# Patient Record
Sex: Male | Born: 1945
Health system: Southern US, Community
[De-identification: ages and names within clinical notes are randomized; demographics above are authoritative.]

## PROBLEM LIST (undated history)

## (undated) DIAGNOSIS — I10 Essential (primary) hypertension: Secondary | ICD-10-CM

## (undated) DIAGNOSIS — Z87442 Personal history of urinary calculi: Secondary | ICD-10-CM

## (undated) DIAGNOSIS — E119 Type 2 diabetes mellitus without complications: Secondary | ICD-10-CM

## (undated) DIAGNOSIS — R001 Bradycardia, unspecified: Secondary | ICD-10-CM

## (undated) DIAGNOSIS — M5030 Other cervical disc degeneration, unspecified cervical region: Secondary | ICD-10-CM

## (undated) DIAGNOSIS — G4733 Obstructive sleep apnea (adult) (pediatric): Secondary | ICD-10-CM

## (undated) DIAGNOSIS — I4891 Unspecified atrial fibrillation: Secondary | ICD-10-CM

## (undated) DIAGNOSIS — K219 Gastro-esophageal reflux disease without esophagitis: Secondary | ICD-10-CM

## (undated) DIAGNOSIS — M51379 Other intervertebral disc degeneration, lumbosacral region without mention of lumbar back pain or lower extremity pain: Secondary | ICD-10-CM

## (undated) DIAGNOSIS — Z9989 Dependence on other enabling machines and devices: Secondary | ICD-10-CM

## (undated) DIAGNOSIS — I4892 Unspecified atrial flutter: Secondary | ICD-10-CM

## (undated) DIAGNOSIS — C679 Malignant neoplasm of bladder, unspecified: Secondary | ICD-10-CM

## (undated) DIAGNOSIS — M459 Ankylosing spondylitis of unspecified sites in spine: Secondary | ICD-10-CM

## (undated) DIAGNOSIS — F32A Depression, unspecified: Secondary | ICD-10-CM

## (undated) DIAGNOSIS — F419 Anxiety disorder, unspecified: Secondary | ICD-10-CM

## (undated) DIAGNOSIS — M069 Rheumatoid arthritis, unspecified: Secondary | ICD-10-CM

## (undated) DIAGNOSIS — E785 Hyperlipidemia, unspecified: Secondary | ICD-10-CM

## (undated) DIAGNOSIS — M5137 Other intervertebral disc degeneration, lumbosacral region: Secondary | ICD-10-CM

## (undated) DIAGNOSIS — G8929 Other chronic pain: Secondary | ICD-10-CM

## (undated) DIAGNOSIS — M5135 Other intervertebral disc degeneration, thoracolumbar region: Secondary | ICD-10-CM

## (undated) DIAGNOSIS — I351 Nonrheumatic aortic (valve) insufficiency: Secondary | ICD-10-CM

## (undated) DIAGNOSIS — I77810 Thoracic aortic ectasia: Secondary | ICD-10-CM

## (undated) DIAGNOSIS — K5792 Diverticulitis of intestine, part unspecified, without perforation or abscess without bleeding: Secondary | ICD-10-CM

## (undated) DIAGNOSIS — Z950 Presence of cardiac pacemaker: Secondary | ICD-10-CM

## (undated) DIAGNOSIS — J449 Chronic obstructive pulmonary disease, unspecified: Secondary | ICD-10-CM

## (undated) DIAGNOSIS — G43909 Migraine, unspecified, not intractable, without status migrainosus: Secondary | ICD-10-CM

## (undated) DIAGNOSIS — I428 Other cardiomyopathies: Secondary | ICD-10-CM

## (undated) DIAGNOSIS — N200 Calculus of kidney: Secondary | ICD-10-CM

## (undated) DIAGNOSIS — F329 Major depressive disorder, single episode, unspecified: Secondary | ICD-10-CM

## (undated) HISTORY — DX: Essential (primary) hypertension: I10

## (undated) HISTORY — DX: Other cardiomyopathies: I42.8

## (undated) HISTORY — DX: Hyperlipidemia, unspecified: E78.5

## (undated) HISTORY — DX: Unspecified atrial fibrillation: I48.91

## (undated) HISTORY — DX: Bradycardia, unspecified: R00.1

## (undated) HISTORY — DX: Type 2 diabetes mellitus without complications: E11.9

## (undated) HISTORY — PX: LAPAROSCOPIC CHOLECYSTECTOMY: SUR755

## (undated) HISTORY — DX: Unspecified atrial flutter: I48.92

## (undated) HISTORY — DX: Thoracic aortic ectasia: I77.810

## (undated) HISTORY — DX: Other chronic pain: G89.29

## (undated) HISTORY — DX: Nonrheumatic aortic (valve) insufficiency: I35.1

## (undated) HISTORY — DX: Diverticulitis of intestine, part unspecified, without perforation or abscess without bleeding: K57.92

## (undated) HISTORY — PX: KNEE ARTHROSCOPY: SUR90

## (undated) HISTORY — PX: TRANSURETHRAL RESECTION OF BLADDER TUMOR WITH GYRUS (TURBT-GYRUS): SHX6458

---
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
## 1989-03-06 HISTORY — PX: CYSTOSCOPY WITH HOLMIUM LASER LITHOTRIPSY: SHX6639

## 1999-04-03 ENCOUNTER — Ambulatory Visit (HOSPITAL_COMMUNITY): Admission: RE | Admit: 1999-04-03 | Discharge: 1999-04-03 | Payer: Self-pay | Admitting: Cardiology

## 2004-09-08 ENCOUNTER — Encounter: Admission: RE | Admit: 2004-09-08 | Discharge: 2004-09-08 | Payer: Self-pay | Admitting: Rheumatology

## 2008-05-18 ENCOUNTER — Ambulatory Visit: Payer: Self-pay | Admitting: Cardiology

## 2008-05-18 ENCOUNTER — Encounter: Payer: Self-pay | Admitting: Cardiology

## 2008-05-20 ENCOUNTER — Encounter: Payer: Self-pay | Admitting: Cardiology

## 2008-06-13 ENCOUNTER — Ambulatory Visit: Payer: Self-pay | Admitting: Cardiology

## 2008-06-18 ENCOUNTER — Ambulatory Visit: Payer: Self-pay | Admitting: Cardiology

## 2008-06-18 ENCOUNTER — Encounter: Payer: Self-pay | Admitting: Physician Assistant

## 2008-07-05 ENCOUNTER — Encounter: Payer: Self-pay | Admitting: Cardiology

## 2008-07-10 ENCOUNTER — Encounter: Payer: Self-pay | Admitting: Cardiology

## 2008-07-17 ENCOUNTER — Ambulatory Visit: Payer: Self-pay | Admitting: Cardiology

## 2009-02-06 ENCOUNTER — Ambulatory Visit: Payer: Self-pay | Admitting: Cardiology

## 2009-02-27 DIAGNOSIS — E782 Mixed hyperlipidemia: Secondary | ICD-10-CM

## 2009-02-27 DIAGNOSIS — I4892 Unspecified atrial flutter: Secondary | ICD-10-CM

## 2009-02-27 DIAGNOSIS — I10 Essential (primary) hypertension: Secondary | ICD-10-CM | POA: Insufficient documentation

## 2009-09-30 ENCOUNTER — Ambulatory Visit: Payer: Self-pay | Admitting: Cardiology

## 2009-09-30 DIAGNOSIS — R079 Chest pain, unspecified: Secondary | ICD-10-CM

## 2009-12-13 ENCOUNTER — Encounter (INDEPENDENT_AMBULATORY_CARE_PROVIDER_SITE_OTHER): Payer: Self-pay | Admitting: *Deleted

## 2009-12-13 ENCOUNTER — Ambulatory Visit: Payer: Self-pay | Admitting: Cardiology

## 2009-12-13 DIAGNOSIS — F17200 Nicotine dependence, unspecified, uncomplicated: Secondary | ICD-10-CM

## 2009-12-13 DIAGNOSIS — R0989 Other specified symptoms and signs involving the circulatory and respiratory systems: Secondary | ICD-10-CM

## 2009-12-30 ENCOUNTER — Ambulatory Visit: Payer: Self-pay | Admitting: Cardiology

## 2010-01-01 ENCOUNTER — Ambulatory Visit: Payer: Self-pay | Admitting: Physician Assistant

## 2010-01-01 DIAGNOSIS — I359 Nonrheumatic aortic valve disorder, unspecified: Secondary | ICD-10-CM

## 2010-07-01 ENCOUNTER — Encounter: Payer: Self-pay | Admitting: Cardiology

## 2010-07-02 ENCOUNTER — Encounter: Payer: Self-pay | Admitting: Cardiology

## 2010-07-08 ENCOUNTER — Ambulatory Visit
Admission: RE | Admit: 2010-07-08 | Discharge: 2010-07-08 | Payer: Self-pay | Source: Home / Self Care | Attending: Cardiology | Admitting: Cardiology

## 2010-07-08 ENCOUNTER — Encounter: Payer: Self-pay | Admitting: Cardiology

## 2010-08-05 NOTE — Assessment & Plan Note (Signed)
Summary: 6 month fu -recv reminder vs   Visit Type:  Follow-up Primary Provider:  Dr. [name]   History of Present Illness: 65 year old male presents for a followup visit. He denies any sense of progressive palpitations. Today's electrocardiogram indicates that he is in atrial fibrillation with controlled ventricular response. He reports compliance with the medications outlined below, and has a routine followup visit with Dr. [name] scheduled for next week.  Mr. Ciardullo continues to complain of occasional left-sided, sudden onset, atypical chest pain. This has not changed in pattern, frequency, or intensity. He continues to smoke one pack per day tobacco has been counseled on smoking cessation. He has NYHA class 2-3 dyspnea on exertion. This is also stable.  He reports significant functional limitation related to hip and leg pain, using a cane to ambulate.  I reviewed his prior nonischemic Cardiolite from December 2009.  Preventive Screening-Counseling & Management  Alcohol-Tobacco     Smoking Status: current     Smoking Cessation Counseling: yes     Packs/Day: 1 PPD  Current Medications (verified): 1)  Lisinopril 20 Mg Tabs (Lisinopril) .... Take 1 Tablet By Mouth Once A Day 2)  Simvastatin 40 Mg Tabs (Simvastatin) .... Take 1 Tablet By Mouth Every Night 3)  Aspir-Low 81 Mg Tbec (Aspirin) .... Take 1 Tablet By Mouth Once A Day 4)  Amlodipine Besylate 10 Mg Tabs (Amlodipine Besylate) .... Take One Tablet By Mouth Daily 5)  B Complex-B12  Tabs (B Complex Vitamins) .... Take 1 Tablet By Mouth Two Times A Day 6)  Metformin Hcl 500 Mg Tabs (Metformin Hcl) .... Take 1 Tablet By Mouth Two Times A Day 7)  Alprazolam 1 Mg Tabs (Alprazolam) .... Take 1 Tablet By Mouth Three Times A Day 8)  Percocet 10-325 Mg Tabs (Oxycodone-Acetaminophen) .... Take 1 Tablet By Mouth Three Times A Day As Needed  Allergies (verified): No Known Drug Allergies  Comments:  Nurse/Medical Assistant: The  patient's medications and allergies were reviewed with the patient and were updated in the Medication and Allergy Lists. List reviewed.   Past History:  Past Medical History: Last updated: 09/29/2009 Atrial Flutter - has preferred ASA to Coumadin Diabetes Type 2 Hypertension Chronic pain - [facility] Hyperlipidemia Nonischemic Cardiolite 12/09, LVEF 55% Mild aortic root dilatation, mild AR Diverticulitis  Social History: Last updated: 09/29/2009 Disabled  Divorced  Tobacco Use - Yes Alcohol Use - yes Regular Exercise - no Drug Use - former (cocaine)  Clinical Review Panels:  Echocardiogram Echocardiogram Left Ventricle:         The left ventricular chamber size is normal. Mild concentric left         ventricular hypertrophy is observed. Left ventricular systolic         function is at the lower limits of normal.  The estimated ejection         fraction is 50-55%.                     Left Atrium:         The left atrium is mildly dilated.                    Right Ventricle:         The right ventricular cavity size is normal. The right ventricular         global systolic function is normal.  Right Atrium:         The right atrial cavity size is normal.                    Aortic Valve:         The aortic valve is trileaflet. Mild aortic cusp sclerosis is present.         There is mild aortic regurgitation.  There is no evidence of aortic         stenosis.                    Mitral Valve:         The mitral valve leaflets appear normal. There is a trace of mitral         regurgitation.                    Tricuspid Valve:         There is no evidence of tricuspid valve regurgitation. The right         ventricular systolic pressure is calculated at 16 mmHg.                     Pulmonic Valve:         There is no evidence of pulmonic regurgitation.                    Pericardium:         There is no pericardial effusion.                     Aorta:         There is mild dilatation of the aortic root.  (05/21/2008)    Social History: Packs/Day:  1 PPD  Review of Systems  The patient denies anorexia, fever, syncope, peripheral edema, prolonged cough, headaches, melena, and hematochezia.         Otherwise reviewed and negative except as outlined above.  Vital Signs:  Patient profile:   65 year old male Height:      68 inches Weight:      219 pounds BMI:     33.42 Pulse rate:   73 / minute BP sitting:   116 / 77  (left arm) Cuff size:   large  Vitals Entered By: Georgina Peer (September 30, 2009 11:01 AM)  Nutrition Counseling: Patient's BMI is greater than 25 and therefore counseled on weight management options.   Physical Exam  Additional Exam:  Overweight male in no acute distress. HEENT: Conjunctiva and lids normal, oropharynx with moist mucosa. Neck: Supple, no carotid bruits. Lungs: Diminished, nonlabored, no wheezing. Cardiac: Irregularly irregular, no S3 or pericardial rub. Extremity: No frank pitting edema. Skin: Warm and dry.   EKG  Procedure date:  09/30/2009  Findings:      Atrial fibrillation at 75 beats per minute with nonspecific ST-T wave changes.  Impression & Recommendations:  Problem # 1:  ATRIAL FLUTTER (ICD-427.32)  History of atrial flutter as well as paroxysmal atrial fibrillation. Patient has preferred aspirin to Coumadin over time with CHADS2 score of 2. He is not bothered by palpitations, and has adequate rate control at baseline without any AV nodal blocking drugs. We will continue present strategy and arrange a followup visit in 6 months.  His updated medication list for this problem includes:    Aspir-low 81 Mg Tbec (Aspirin) .Marland Kitchen... Take 1 tablet by mouth once a day  Orders:  EKG w/ Interpretation (93000)  Problem # 2:  HYPERTENSION, UNSPECIFIED (ICD-401.9)  Blood pressure well controlled today.  His updated medication list for this problem includes:    Lisinopril 20  Mg Tabs (Lisinopril) .Marland Kitchen... Take 1 tablet by mouth once a day    Aspir-low 81 Mg Tbec (Aspirin) .Marland Kitchen... Take 1 tablet by mouth once a day    Amlodipine Besylate 10 Mg Tabs (Amlodipine besylate) .Marland Kitchen... Take one tablet by mouth daily  Problem # 3:  CHEST PAIN UNSPECIFIED (ICD-786.50)  Atypical by description, with prior nonischemic Cardiolite from December 2009. We will keep an eye on this, and plan a followup evaluation with an echocardiogram and Cardiolite around the time of his next visit.  His updated medication list for this problem includes:    Lisinopril 20 Mg Tabs (Lisinopril) .Marland Kitchen... Take 1 tablet by mouth once a day    Aspir-low 81 Mg Tbec (Aspirin) .Marland Kitchen... Take 1 tablet by mouth once a day    Amlodipine Besylate 10 Mg Tabs (Amlodipine besylate) .Marland Kitchen... Take one tablet by mouth daily  Patient Instructions: 1)  Your physician wants you to follow-up in: 6 months. You will receive a reminder letter in the mail one-two months in advance. If you don't receive a letter, please call our office to schedule the follow-up appointment. 2)  Your physician recommends that you continue on your current medications as directed. Please refer to the Current Medication list given to you today.  Appended Document: 6 month fu -recv reminder vs    Clinical Lists Changes  Medications: Added new medication of CYMBALTA 60 MG CPEP (DULOXETINE HCL) Take 1 tablet by mouth once a day Added new medication of METHOCARBAMOL 500 MG TABS (METHOCARBAMOL) Take 1 tablet by mouth every 6 hrs as needed.

## 2010-08-05 NOTE — Letter (Signed)
Summary: Lexiscan or Dobutamine Adult nurse at [facility]. [address]   [place], [place] [address]   Phone: [phone]  Fax: [phone]      Allen or Dobutamine Cardiolite Strss Test    [facility]  Appointment Date:_  Appointment Time:_  Your doctor has ordered a CARDIOLITE STRESS TEST using a medication to stimulate exercise so that you will not have to walk on the treadmill to determine the condition of your heart during stress. If you take blood pressure medication, ask your doctor if you should take it the day of your test. You should not have anything to eat or drink at least 4 hours before your test is scheduled, and no caffeine, including decaffeinated tea and coffee, chocolate, and soft drinks for 24 hours before your test.  You will need to register at the Outpatient/Main Entrance at the hospital 15 minutes before your appointment time. It is a good idea to bring a copy of your order with you. They will direct you to the Diagnostic Imaging (Radiology) Department.  You will be asked to undress from the waist up and given a hospital gown to wear, so dress comfortably from the waist down for example: Sweat pants, shorts, or skirt Rubber soled lace up shoes (tennis shoes)  Plan on about three hours from registration to release from the hospital   You may take all of your medications with water the morning of your test. If you do not eat breakfast, you may want to hold your diabetic medications.

## 2010-08-05 NOTE — Assessment & Plan Note (Signed)
Summary: ROV-PER PATIENT DR. Wenda Tyler REQUESTING   Visit Type:  Follow-up Primary Provider:  Dr. Celedonio Tyler   History of Present Illness: 65 year old male referred for followup by Dr. Wenda Tyler. He reports having recurrent episodes of "sharp" chest discomfort, sporadic overall. He states this has been worse over the last 3-4 weeks, sometimes with radiation into the left face and arm. The symptoms last for a few minutes. He has not been using nitroglycerin. He also complaints of some unassociated right-sided posterior neck pain.  Prior noninvasive cardiac testing is reviewed below. He has a history of normal coronary arteries by remote cardiac catheterization in 2000 by Dr. Wynonia Tyler.  He has had no sense of palpitations or dizziness. Continues to prefer aspirin for management of atrial flutter.  Preventive Screening-Counseling & Management  Alcohol-Tobacco     Smoking Status: current     Packs/Day: 1.0  Comments: Started about 65 yrs old  Current Medications (verified): 1)  Lisinopril 20 Mg Tabs (Lisinopril) .... Take 1 Tablet By Mouth Once A Day 2)  Simvastatin 40 Mg Tabs (Simvastatin) .... Take 1 Tablet By Mouth Every Night 3)  Aspir-Low 81 Mg Tbec (Aspirin) .... Take 1 Tablet By Mouth Once A Day 4)  Amlodipine Besylate 10 Mg Tabs (Amlodipine Besylate) .... Take One Tablet By Mouth Daily 5)  B Complex-B12  Tabs (B Complex Vitamins) .... Take 1 Tablet By Mouth Two Times A Day 6)  Metformin Hcl 500 Mg Tabs (Metformin Hcl) .... Take 1 Tablet By Mouth Once Daily 7)  Alprazolam 1 Mg Tabs (Alprazolam) .... Take 1 Tablet By Mouth Three Times A Day 8)  Percocet 10-325 Mg Tabs (Oxycodone-Acetaminophen) .... Take 1 Tablet By Mouth Three Times A Day As Needed 9)  Citalopram Hydrobromide 40 Mg Tabs (Citalopram Hydrobromide) .... Take 1 1/2 Tablet By Mouth Once Daily. 10)  Vitamin D (Ergocalciferol) 50000 Unit Caps (Ergocalciferol) .... Take 1 Capsule By Mouth Once Per Week 11)  Metoprolol Tartrate 25  Mg Tabs (Metoprolol Tartrate) .... Take 1/2 Tablet By Mouth Twice A Day 12)  Nitrostat 0.4 Mg Subl (Nitroglycerin) .Marland Kitchen.. 1 Tablet Under Tongue At Onset of Chest Pain; You May Repeat Every 5 Minutes For Up To 3 Doses.  Allergies (verified): No Known Drug Allergies  Comments:  Nurse/Medical Assistant: The patient's medications were reviewed with the patient and were updated in the Medication List. Pt brought a list of medications to office visit.  Dylan Maxin, RN, BSN (December 13, 2009 10:50 AM)  Past History:  Past Medical History: Last updated: 09/29/2009 Atrial Flutter - has preferred ASA to Coumadin Diabetes Type 2 Hypertension Chronic pain - followed at [facility] Hyperlipidemia Nonischemic Cardiolite 12/09, LVEF 55% Mild aortic root dilatation, mild AR Diverticulitis  Social History: Last updated: 09/29/2009 Disabled  Divorced  Tobacco Use - Yes Alcohol Use - yes Regular Exercise - no Drug Use - former (cocaine)   Social History: Packs/Day:  1.0  Review of Systems       The patient complains of chest pain and headaches.  The patient denies anorexia, fever, syncope, dyspnea on exertion, peripheral edema, prolonged cough, hemoptysis, melena, hematochezia, and severe indigestion/heartburn.         Otherwise reviewed and negative.  Vital Signs:  Patient profile:   65 year old male Height:      68 inches Weight:      224 pounds Pulse rate:   65 / minute BP sitting:   122 / 78  (left arm) Cuff size:   large  Vitals Entered By: Dylan Maxin, RN, BSN (December 13, 2009 10:44 AM) Comments Dr. Wenda Tyler requested appt d/t shart chest pains. also pain down (L) arm. Pt states he's also had sharp pain up the back of his neck and into his head.   Physical Exam  Additional Exam:  Overweight male in no acute distress. Uses cane to walk. HEENT: Conjunctiva and lids normal, oropharynx with poor dentition. Neck: Supple, question soft right carotid bruit. Lungs: Diminished,  nonlabored, no wheezing. Cardiac: Irregularly irregular, no S3 or pericardial rub. Extremity: No frank pitting edema. Brace on right wrist. Skin: Warm and dry. Musculoskeletal: No kyphosis. Neuropsychiatric: Alert and oriented x3, affect grossly appropriate.   Nuclear Study  Procedure date:  06/18/2008  Findings:      Nondiagnostic ST changes with LVEF calculated at 39%, global hypokinesis, no reversible perfusion defects to suggest ischemia.  Echocardiogram  Procedure date:  05/21/2008  Findings:      Mild LVH with LVEF 50-55%, mild LAE, mild AR, trace MR, RVSP 16 mmHg.  EKG  Procedure date:  12/13/2009  Findings:      Probable atrial flutter with variable conduction, 66 beats per minute, nonspecific ST-T changes.  Impression & Recommendations:  Problem # 1:  CHEST PAIN UNSPECIFIED (ICD-786.50)  Somewhat atypical, however progressing in frequency and duration. We had already discussed followup ischemic testing for his next routine visit. Will go ahead and arrange a Lexiscan Cardiolite on medical therapy as well as a 2-D echocardiogram to followup LVEF. Office follow up over the next 3 weeks for review. Otherwise no specific medication changes were made. He has nitroglycerin available.  His updated medication list for this problem includes:    Lisinopril 20 Mg Tabs (Lisinopril) .Marland Kitchen... Take 1 tablet by mouth once a day    Aspir-low 81 Mg Tbec (Aspirin) .Marland Kitchen... Take 1 tablet by mouth once a day    Amlodipine Besylate 10 Mg Tabs (Amlodipine besylate) .Marland Kitchen... Take one tablet by mouth daily    Metoprolol Tartrate 25 Mg Tabs (Metoprolol tartrate) .Marland Kitchen... Take 1/2 tablet by mouth twice a day    Nitrostat 0.4 Mg Subl (Nitroglycerin) .Marland Kitchen... 1 tablet under tongue at onset of chest pain; you may repeat every 5 minutes for up to 3 doses.  Orders: 2-D Echocardiogram (2D Echo) Nuclear Med (Nuc Med)  Problem # 2:  ATRIAL FLUTTER (ICD-427.32)  Chronic, rate controlled, patient prefers aspirin  instead of Coumadin.  His updated medication list for this problem includes:    Aspir-low 81 Mg Tbec (Aspirin) .Marland Kitchen... Take 1 tablet by mouth once a day    Metoprolol Tartrate 25 Mg Tabs (Metoprolol tartrate) .Marland Kitchen... Take 1/2 tablet by mouth twice a day  Orders: EKG w/ Interpretation (93000)  Orders: EKG w/ Interpretation (93000)  Problem # 3:  TOBACCO ABUSE (ICD-305.1)  Patient continues to smoke cigarettes. We again discussed the importance of smoking cessation.  Problem # 4:  CAROTID BRUIT (ICD-785.9)  Possible soft right carotid bruit, will further investigate with carotid Dopplers.  Orders: Carotid Duplex (Carotid Duplex)  Problem # 5:  HYPERLIPIDEMIA-MIXED (ICD-272.4)  Cut simvastatin back to 20 mg daily while concurrently on Norvasc.  His updated medication list for this problem includes:    Simvastatin 20 Mg Tabs (Simvastatin) .Marland Kitchen... Take one tablet by mouth daily at bedtime  Patient Instructions: 1)  FOLLOW UP OFFICE VISIT WITH OUR PA ON WEDNESDAY, January 01, 2010 AT 1:30PM. 2)  Decrease your Simvastatin (Zocor) to 20mg  by mouth once daily. This is 1/2 of  your 40mg  tablet. 3)  Your physician has requested that you have a carotid duplex. This test is an ultrasound of the carotid arteries in your neck. It looks at blood flow through these arteries that supply the brain with blood. Allow one hour for this exam. There are no restrictions or special instructions. If the results of your test are normal or stable, you will receive a letter. If they are abnormal, the nurse will contact you by phone.  4)  Your physician has requested that you have an echocardiogram.  Echocardiography is a painless test that uses sound waves to create images of your heart. It provides your doctor with information about the size and shape of your heart and how well your heart's chambers and valves are working.  This procedure takes approximately one hour. There are no restrictions for this procedure. If the  results of your test are normal or stable, you will receive a letter. If they are abnormal, the nurse will contact you by phone.  5)  Your physician has requested that you have an Agricultural consultant.  For further information please visit [email or web address].  Please follow instruction sheet, as given. If the results of your test are normal or stable, you will receive a letter. If they are abnormal, the nurse will contact you by phone.

## 2010-08-05 NOTE — Assessment & Plan Note (Signed)
Summary: 3 WK F/U PER 6/10 OV W/SM-JM   Visit Type:  Follow-up Primary Provider:  Dr. [name]   History of Present Illness: Patient presents for scheduled early followup.  He was recently referred to Dr. Domenic Polite for evaluation of atypical chest pain. He presented with history of normal coronary arteries by previous catheterization in 2000, by Dr. [name]. He had a nonischemic Cardiolite in December 2009, and has multiple cardiac risk factors, including ongoing tobacco smoking and diabetes mellitus.  A Lexiscan Cardiolite test was negative for ischemia; EF 54%. A 2-D echo was also obtained, indicating preserved LVF (EF 50-55%), with stable, mild aortic regurgitation.  He was also referred for carotid Dopplers, for evaluation of a right-sided bruit. This indicated less than 50% bilateral ICA stenosis.  Patient now returns complaining of a singular episode of sharp chest pain, since his last visit. This occurred while he was reaching over across his seat belt to address a Engineer, structural.   Preventive Screening-Counseling & Management  Alcohol-Tobacco     Smoking Status: current     Smoking Cessation Counseling: yes     Packs/Day: 1 PPD  Current Medications (verified): 1)  Lisinopril 20 Mg Tabs (Lisinopril) .... Take 1 Tablet By Mouth Once A Day 2)  Simvastatin 20 Mg Tabs (Simvastatin) .... Take One Tablet By Mouth Daily At Bedtime 3)  Aspir-Low 81 Mg Tbec (Aspirin) .... Take 1 Tablet By Mouth Once A Day 4)  Amlodipine Besylate 10 Mg Tabs (Amlodipine Besylate) .... Take One Tablet By Mouth Daily 5)  Vitamin B-12 1000 Mcg Tabs (Cyanocobalamin) .... Take 1 Tablet By Mouth Two Times A Day 6)  Metformin Hcl 500 Mg Tabs (Metformin Hcl) .... Take 1 Tablet By Mouth Once Daily 7)  Alprazolam 1 Mg Tabs (Alprazolam) .... Take 1 Tablet By Mouth Three Times A Day 8)  Percocet 10-325 Mg Tabs (Oxycodone-Acetaminophen) .... Take 1 Tablet By Mouth Four Times A Day As Needed 9)  Citalopram  Hydrobromide 40 Mg Tabs (Citalopram Hydrobromide) .... Take 1 1/2 Tablet By Mouth Once Daily. 10)  Vitamin D (Ergocalciferol) 50000 Unit Caps (Ergocalciferol) .... Take 1 Capsule By Mouth Once Per Week 11)  Metoprolol Tartrate 25 Mg Tabs (Metoprolol Tartrate) .... Take 1/2 Tablet By Mouth Twice A Day 12)  Nitrostat 0.4 Mg Subl (Nitroglycerin) .Marland Kitchen.. 1 Tablet Under Tongue At Onset of Chest Pain; You May Repeat Every 5 Minutes For Up To 3 Doses.  Allergies (verified): No Known Drug Allergies  Comments:  Nurse/Medical Assistant: The patient's medication list and allergies were reviewed with the patient and were updated in the Medication and Allergy Lists.  Past History:  Past Medical History: Atrial Flutter - has preferred ASA to Coumadin Diabetes Type 2 Hypertension Chronic pain - [facility] at [facility] Hyperlipidemia Nonischemic Cardiolite 12/09, LVEF 55% Normal cardiac catheterization in 2000 Mild aortic root dilatation, mild AR Diverticulitis  Social History: Packs/Day:  1 PPD  Review of Systems       No fevers, chills, hemoptysis, dysphagia, melena, hematocheezia, hematuria, rash, claudication, orthopnea, pnd, pedal edema. All other systems negative.   Vital Signs:  Patient profile:   65 year old male Height:      68 inches Weight:      218 pounds Pulse rate:   57 / minute BP sitting:   111 / 71  (left arm) Cuff size:   large  Vitals Entered By: Georgina Peer (January 01, 2010 1:31 PM)  Physical Exam  Additional Exam:  GEN:65 year old male, sitting upright,  no distress HEENT: NCAT,PERRLA,EOMI NECK: palpable pulses, no bruits; no JVD; no TM LUNGS: CTA bilaterally HEART: irregularly irregular (S1S2); no significant murmurs; no rubs; no gallops ABD: soft, NT; intact BS EXT: intact distal pulses; no edema SKIN: warm, dry MUSC: no obvious deformity NEURO: A/O (x3)     Impression & Recommendations:  Problem # 1:  CHEST PAIN UNSPECIFIED (ICD-786.50)  No further workup  is indicated. Patient presented with recent history of atypical chest pain, and a perfusion imaging study was negative for ischemia. He has history of a normal cardiac catheterization 2000, and a non--ischemic Cardiolite test in December 2009. Continued aggressive primary prevention was advocated, including smoking cessation. Will schedule a return clinic visit with Dr. Domenic Polite in 6 months.  Problem # 2:  AORTIC INSUFFICIENCY (ICD-424.1)  Recently assessed as mild by echocardiography. Continue to monitor clinically.  Problem # 3:  ATRIAL FLUTTER (ICD-427.32)  continue current medication regimen, including low-dose aspirin, per patient's preference.  Problem # 4:  TOBACCO ABUSE (ICD-305.1)  patient strongly advised to stop smoking.  Patient Instructions: 1)  Your physician wants you to follow-up in: 6 months. You will receive a reminder letter in the mail one-two months in advance. If you don't receive a letter, please call our office to schedule the follow-up appointment. 2)  Your physician recommends that you continue on your current medications as directed. Please refer to the Current Medication list given to you today.

## 2010-08-07 NOTE — Assessment & Plan Note (Signed)
Summary: 6 MO FU PER DEC REMINDER   Visit Type:  Follow-up Primary Provider:  Dr. [name]   History of Present Illness: 65 year old male presents for followup. He was seen back in June by [name].  Since last visit he reports using a single sublingual nitroglycerin on one occasion. He continues to have sporadic episodes of chest pain, many of which are atypical in description. I suspect he is having some angina however. We reviewed his most recent stress test and his present medications. He is not reporting any progressive symptoms.   He continues to smoke cigarettes. I continue to discuss with him the importance of smoking cessation. He has not been able to quit.  He also continues to have problems with chronic back pain, uses a cane to ambulate, and sees [name] in [place].   Preventive Screening-Counseling & Management  Alcohol-Tobacco     Smoking Status: current     Smoking Cessation Counseling: yes     Packs/Day: 1 PPD  Current Medications (verified): 1)  Lisinopril 20 Mg Tabs (Lisinopril) .... Take 1 Tablet By Mouth Once A Day 2)  Simvastatin 20 Mg Tabs (Simvastatin) .... Take One Tablet By Mouth Daily At Bedtime 3)  Aspir-Low 81 Mg Tbec (Aspirin) .... Take 1 Tablet By Mouth Once A Day 4)  Amlodipine Besylate 10 Mg Tabs (Amlodipine Besylate) .... Take 1/2 Tablet By Mouth Daily 5)  Vitamin B-12 1000 Mcg Tabs (Cyanocobalamin) .... Take 1 Tablet By Mouth Two Times A Day 6)  Metformin Hcl 500 Mg Tabs (Metformin Hcl) .... Take 1 Tablet By Mouth Two Times A Day 7)  Alprazolam 1 Mg Tabs (Alprazolam) .... Take 1 Tablet By Mouth Three Times A Day 8)  Percocet 5-325 Mg Tabs (Oxycodone-Acetaminophen) .... Take 1 Tablet By Mouth Once A Day 9)  Citalopram Hydrobromide 40 Mg Tabs (Citalopram Hydrobromide) .... Take 1 1/2 Tablet By Mouth Once Daily. 10)  Vitamin D (Ergocalciferol) 50000 Unit Caps (Ergocalciferol) .... Take 1 Capsule By Mouth Once Per Week 11)  Metoprolol  Tartrate 25 Mg Tabs (Metoprolol Tartrate) .... Take 1/2 Tablet By Mouth Twice A Day 12)  Nitrostat 0.4 Mg Subl (Nitroglycerin) .Marland Kitchen.. 1 Tablet Under Tongue At Onset of Chest Pain; You May Repeat Every 5 Minutes For Up To 3 Doses. 13)  Omeprazole 20 Mg Cpdr (Omeprazole) .... Take 1 Tablet By Mouth Once A Day 14)  Cholestyramine 4 Gm Pack (Cholestyramine) .... One Packet By Mouth Daily 15)  Carisoprodol 350 Mg Tabs (Carisoprodol) .... Take 1 Tablet By Mouth Two Times A Day 16)  Lyrica 75 Mg Caps (Pregabalin) .... Take 1 Tablet By Mouth Once A Day  Allergies (verified): No Known Drug Allergies  Comments:  Nurse/Medical Assistant: The patient's medication list and allergies were reviewed with the patient and were updated in the Medication and Allergy Lists.  Past History:  Past Medical History: Last updated: 01/01/2010 Atrial Flutter - has preferred ASA to Coumadin Diabetes Type 2 Hypertension Chronic pain - followed at [facility] Hyperlipidemia Nonischemic Cardiolite 12/09, LVEF 55% Normal cardiac catheterization in 2000 Mild aortic root dilatation, mild AR Diverticulitis  Social History: Last updated: 09/29/2009 Disabled  Divorced  Tobacco Use - Yes Alcohol Use - yes Regular Exercise - no Drug Use - former (cocaine)  Review of Systems       The patient complains of dyspnea on exertion.  The patient denies anorexia, fever, weight loss, syncope, peripheral edema, prolonged cough, headaches, hemoptysis, melena, and hematochezia.  Otherwise reviewed and negative.  Vital Signs:  Patient profile:   65 year old male Height:      68 inches Weight:      228 pounds BMI:     34.79 Pulse rate:   64 / minute BP sitting:   129 / 81  (left arm) Cuff size:   large  Vitals Entered By: Georgina Peer (July 08, 2010 1:23 PM)  Nutrition Counseling: Patient's BMI is greater than 25 and therefore counseled on weight management options.  Physical Exam  Additional Exam:   GEN:65 year old male, sitting upright, no distress HEENT: NCAT,PERRLA,EOMI NECK: palpable pulses, no bruits; no JVD; no TM LUNGS: CTA bilaterally HEART: irregularly irregular (S1S2); no significant murmurs; no rubs; no gallops ABD: soft, NT; intact BS EXT: intact distal pulses; no edema SKIN: warm, dry MUSC: no obvious deformity NEURO: A/O (x3)     Nuclear Study  Procedure date:  12/30/2009  Findings:      Lexiscan Cardiolite with nondiagnostic ST segment changes, occasional PVCs, no chest pain. Evidence of soft tissue attenuation with moderate inferior defect, no clear ischemia, LVEF 54%.  Prior Report Reviewed for Echocardiogram:  Findings: 05/21/2008 Mild LVH with LVEF 50-55%, mild LAE, mild AR, trace MR, RVSP 16 mmHg.  Comments:    Impression & Recommendations:  Problem # 1:  CHEST PAIN UNSPECIFIED (ICD-786.50)  Both combination of typical and atypical symptoms. He used a single sublingual nitroglycerin since I saw him. Symptoms are not progressive. He may well be having occasional angina, however had a reassuring stress test over the last 6 months. Recommend continued medical therapy. Followup in 6 months, sooner if progressive symptoms.  His updated medication list for this problem includes:    Lisinopril 20 Mg Tabs (Lisinopril) .Marland Kitchen... Take 1 tablet by mouth once a day    Aspir-low 81 Mg Tbec (Aspirin) .Marland Kitchen... Take 1 tablet by mouth once a day    Amlodipine Besylate 10 Mg Tabs (Amlodipine besylate) .Marland Kitchen... Take 1/2 tablet by mouth daily    Metoprolol Tartrate 25 Mg Tabs (Metoprolol tartrate) .Marland Kitchen... Take 1/2 tablet by mouth twice a day    Nitrostat 0.4 Mg Subl (Nitroglycerin) .Marland Kitchen... 1 tablet under tongue at onset of chest pain; you may repeat every 5 minutes for up to 3 doses.  Orders: EKG w/ Interpretation (93000)  Problem # 2:  TOBACCO ABUSE (ICD-305.1)  We continue to discuss smoking cessation. He has not been able to quit.  Problem # 3:  HYPERTENSION, UNSPECIFIED  (ICD-401.9)  Blood pressure reasonable today.  His updated medication list for this problem includes:    Lisinopril 20 Mg Tabs (Lisinopril) .Marland Kitchen... Take 1 tablet by mouth once a day    Aspir-low 81 Mg Tbec (Aspirin) .Marland Kitchen... Take 1 tablet by mouth once a day    Amlodipine Besylate 10 Mg Tabs (Amlodipine besylate) .Marland Kitchen... Take 1/2 tablet by mouth daily    Metoprolol Tartrate 25 Mg Tabs (Metoprolol tartrate) .Marland Kitchen... Take 1/2 tablet by mouth twice a day  Problem # 4:  ATRIAL FLUTTER (ICD-427.32)  Patient in sinus rhythm today. He prefers aspirin to Coumadin therapy.  His updated medication list for this problem includes:    Aspir-low 81 Mg Tbec (Aspirin) .Marland Kitchen... Take 1 tablet by mouth once a day    Metoprolol Tartrate 25 Mg Tabs (Metoprolol tartrate) .Marland Kitchen... Take 1/2 tablet by mouth twice a day  Orders: EKG w/ Interpretation (93000)  Patient Instructions: 1)  Your physician wants you to follow-up in: 6 months. You will  receive a reminder letter in the mail one-two months in advance. If you don't receive a letter, please call our office to schedule the follow-up appointment. 2)  Your physician recommends that you continue on your current medications as directed. Please refer to the Current Medication list given to you today. 3)  Your physician discussed the hazards of tobacco use.  Tobacco use cessation is recommended and techniques and options to help you quit were discussed.

## 2010-11-14 ENCOUNTER — Other Ambulatory Visit: Payer: Self-pay | Admitting: Cardiology

## 2010-11-14 NOTE — Telephone Encounter (Signed)
Dylan Tyler

## 2010-11-18 NOTE — Assessment & Plan Note (Signed)
[facility] OFFICE NOTE   HAMDAAN, KOEGLER                       MRN:          [id number]  DATE:02/06/2009                            DOB:          23-Nov-1945    PRIMARY CARE PHYSICIAN:  Janne Napoleon. [name], MD   REASON FOR VISIT:  Scheduled followup.   HISTORY OF PRESENT ILLNESS:  Mr. Brotman was seen in the office back in  December.  His history is detailed in the previous note.  He has a  history of atrial flutter that was diagnosed during an inpatient  consultation by Dr. Stanford [name] in November 2009.  Duration of this  arrhythmia was uncertain, and the patient's CHADS2 was 2 based on  hypertension and diabetes mellitus, although he preferred aspirin over  Coumadin chronically.  He underwent ischemic evaluation which was  overall reassuring, with Cardiolite demonstrating no active ischemia in  December 2009 and an echocardiogram demonstrating a left ventricular  ejection fraction of 50-55% in November 2009.  Mr. Colantonio presents for a  regular visit today denying any sense of palpitations.  He does report  occasional breathlessness and chest discomfort as well as occasional  feelings of dizziness and weakness.  He has had no syncope.  His  electrocardiogram today actually shows normal sinus rhythm with a  prolonged PR interval of 266 msec and nonspecific ST changes.  When he  actually converted from atrial flutter is not entirely clear.   Mr. Stokley tells me that he is followed through the pain clinic at [facility].  He complains a lot of back  pain and other chronic pain.  The details of his treatment plan are not  clear to me.   ALLERGIES:  No known drug allergies.   MEDICATIONS:  1. Aspirin 325 mg p.o. daily.  2. Metformin 500 mg p.o. b.i.d.  3. Vitamin B12 b.i.d.  4. Lisinopril 20 mg p.o. daily (out).  5. Simvastatin 40 mg p.o. daily (out).  6. Xanax 1 mg p.o.  t.i.d.  7. Amlodipine 10 mg p.o. daily.  8. Vicodin p.r.n.   REVIEW OF SYSTEMS:  As outlined above.  He focuses mainly on  intermittent flank and back discomfort as well as leg weakness and pain.  Otherwise, systems reviewed and negative.   PHYSICAL EXAMINATION:  VITAL SIGNS:  Blood pressure is 125/71, heart  rate is 67, weight is 224 pounds.  GENERAL:  This is an obese male in no acute distress.  HEENT:  Conjunctivae and lids normal.  Oropharynx clear.  NECK:  Supple.  No elevated jugular venous pressure.  No loud bruits.  No thyromegaly.  LUNGS:  Clear with diminished breath sounds.  Nonlabored breathing is  noted.  CARDIAC:  A regular rate and rhythm with no loud systolic murmur.  PMI  is indistinct.  ABDOMEN:  Obese and protuberant.  Normoactive bowel sounds.  Unable to  palpate liver edge.  No tenderness.  EXTREMITIES:  Trace edema around the ankles.  Distal pulses 1 plus.  SKIN:  Warm and dry.  MUSCULOSKELETAL:  No  kyphosis noted.  NEUROPSYCHIATRIC:  The patient is alert and oriented x3.   IMPRESSION AND RECOMMENDATIONS:  1. History of atrial flutter of uncertain duration, and with      controlled ventricular response at baseline on no AV nodal blocking      drugs.  CHADS2 score is 2, although the patient prefers aspirin at      this time rather than Coumadin.  He has at some point converted to      sinus rhythm based on his followup electrocardiogram today.      Arguing for underlying conduction system disease is the patient's      prolonged PR interval and also his controlled ventricular response      while in atrial flutter on no specific medications.  He does have      episodic weakness and dizziness, although has not had any frank      syncope.  I asked him to let us know if these symptoms progress and      might provide an event recorder to ensure that he is not having any      symptomatic bradycardia.  Otherwise, he will continue his aspirin.      No other specific  medication adjustments were made.  2. Hypertension, followed largely by Dr. [name].  Refills were provided      for lisinopril.  3. Hyperlipidemia, on simvastatin.  This has also been followed by Dr.      [name].  LDL was 86 based on our labs from December 2009.  A refill      was provided for simvastatin.  4. Ongoing tobacco abuse.  I discussed the importance of smoking      cessation today with Mr. Ruecker.     [name], MD  Electronically Signed    SGM/MedQ  DD: 02/06/2009  DT: 02/07/2009  Job #: [id number]   cc:   Janne Napoleon. [name], MD

## 2010-11-18 NOTE — Assessment & Plan Note (Signed)
[facility]                          [facility]ICE NOTE   PRIMUS, MCNAMARA                       MRN:          [id number]  DATE:06/13/2008                            DOB:          January 23, 1946    PRIMARY CARDIOLOGIST:  [name], MD   REASON FOR VISIT:  Post-hospital followup.   HISTORY:  Dylan Tyler presents to our clinic for the first time,  following recent consultation here at [facility], by Dr.  Stanford [name].  He presented with history of no documented coronary artery  disease, having undergone a normal coronary angiogram in 2000, by Dr.  Tollie Eth.  His presenting symptoms were abdominal and, in fact, he  is undergoing continued evaluation and consideration for possible  cholecystectomy.  He also was recently diagnosed with diverticulitis  earlier this week, following his colonoscopy.  He is currently under the  care of Dr. Valentino Saxon for this, and soon will be establishing with  Dr. [name] as his primary physician.   While he was in the hospital, he was noted to be in a dysrhythmia,  diagnosed as atrial flutter by our team.  He was initially treated with  low-dose Cardizem and Lopressor, but then developed significant  bradycardia with associated pauses.  Recommendation, therefore, was to  not treat with AV nodal blocking agents.  It was also pointed out that  the patient was relatively asymptomatic with this dysrhythmia, which he  informs me today has been longstanding.   With respect to the issue of Coumadin, it was felt that given the  finding of cocaine in his urine, that he might not be a good long-term  candidate for this.  We also wanted to demonstrate compliance on his  part.  A 2-D echocardiogram showed normal LVEF (50-55%) with mild aortic  regurgitation and mild dilatation of the aortic root.   The patient was also started on an ACE inhibitor for treatment of  hypertension.  Of note, his cardiac  risk factors are notable for type 2  diabetes mellitus, hypertension, ongoing tobacco, and age.  A lipid  profile was not drawn.   Clinically, Dylan Tyler denies any tachypalpitations.  He continues to  have abdominal discomfort.  He had occasional, sharp left-sided chest  twinges which are nonexertional.  However, he also suggests symptoms  of significant exertional dyspnea, which is not new, as well as  occasional exertional chest discomfort and pressure, relieved by rest.  He states that he has not had a stress test since his normal coronary  angiogram in 2000.   EKG in our office today indicates atypical atrial flutter with variable  conduction in the mid 80 bpm range.   CURRENT MEDICATIONS:  Full-dose aspirin, metformin 500 b.i.d.,  lisinopril 10 daily.   PHYSICAL EXAMINATION:  VITAL SIGNS:  Blood pressure 174/94, pulse 76,  irregular weight 224.  GENERAL:  A 65 year old male sitting upright in no distress.  HEENT:  Normocephalic, atraumatic.  NECK:  Palpable carotid pulse without bruits.  LUNGS:  Diminished breath sounds at bases, but without crackles or  wheezes.  HEART:  Irregularly irregular.  No significant murmurs.  No rubs.  ABDOMEN:  Protuberant, nontender.  EXTREMITIES:  No significant edema.  NEUROLOGIC:  No focal deficit.   IMPRESSION:  1. Atrial dysrhythmia.      a.     Probable atypical atrial flutter.  2. Hypertension, uncontrolled.  3. Polysubstance abuse.      a.     Including cocaine.  4. Preserved LVF.  5. Mild aortic regurgitation.      a.     Mild aortic root dilatation.  6. Exertional chest discomfort.      a.     Normal coronary angiogram in 2000.  7. Type 2 diabetes mellitus.  8. Ongoing tobacco.  9. Diverticulitis/possible cholecystitis.   PLAN:  1. Schedule adenosine stress Cardiolite for risk stratification.  If      this is negative for evidence of definite ischemia, then the      patient would be cleared to undergo surgery, if that  becomes the      recommendation in the next few weeks.  If, however, there is any      suggestion of ischemia, then we will strongly consider a repeat      cardiac catheterization.  The patient is agreeable with this plan.  2. Increase lisinopril to 20 mg daily for improved blood pressure      control.  We may need to add an additional agent (i.e. Norvasc) if      blood pressure remains uncontrolled.  Of note, he recently      demonstrated significant bradycardia with pauses on low dose of      Cardizem and Lopressor.  We will, therefore, continue deferring      adding any AV nodal blocking agents.  3. Defer decision regarding Coumadin anticoagulation, pending further      cardiac workup.  At this point in time, the patient reports      abstinence from using cocaine.  He appears to be willing to be      compliant both with respect to followup as well as recommended      medications.  We will, therefore, reconsider starting Coumadin, if      he does not need to undergo coronary angiography within the next      few weeks.  In the meanwhile, he is to continue on full-dose      aspirin.  4. Assess lipid status with a fasting lipid profile at the time of his      stress test.  Ideally, the patient should be on a cholesterol-      lowering agent, particularly in light of his diabetes mellitus.      Further recommendations will be made pending review of this result.  5. Schedule a return clinic followup with myself and Dr. Domenic Polite in 1      month, for review of stress test results and further      recommendations.      Mannie Stabile, PA-C  Electronically Signed      [name], MD  Electronically Signed   GS/MedQ  DD: 06/13/2008  DT: 06/14/2008  Job #: [id number]   cc:   [name], M.D.

## 2010-11-18 NOTE — Assessment & Plan Note (Signed)
[facility]                          [facility]ICE NOTE   FRASER, Dylan Tyler                       MRN:          [id number]  DATE:07/17/2008                            DOB:          January 06, 1946    PRIMARY CARE PHYSICIAN:  Dr. [name].   REASON FOR VISIT:  Followup atrial flutter and cardiac testing.   HISTORY OF PRESENT ILLNESS:  Dylan Tyler was seen by [name] back in  December, having been seen in the hospital in consultation by Dr.  Stanford [name] with newly diagnosed atrial flutter.  Duration of this  arrhythmia is uncertain, but potentially chronic.  The patient's CHADS2  score based on hypertension and diabetes mellitus is 2 at this point.  Dylan Tyler was not initiated on Coumadin originally given concerns about  compliance and previous documentation of substance abuse including  cocaine.  He was referred for ischemic testing and underwent a  Cardiolite in December demonstrating no evidence of ischemia.  The  ejection fraction was calculated at 39% in the setting of atrial  flutter, although this was noted to be 50-55% by echocardiography.  Mr.  Tyler is just recently status post elective laparoscopic  cholecystectomy by Dr. [name] last week and is due to see him back in  the office on the July 19, 2008.  He is not reporting any problems  with progressive breathlessness, chest pain, or palpitations.  His  electrocardiogram today shows atrial flutter with a controlled  ventricular rate in the 70s on no specific AV nodal blocking agents.  He  continues on full-dose aspirin at this time.  Today, we had a frank  discussion about Coumadin versus aspirin as far as stroke prophylaxis is  concerned and weighed the risks and benefits.  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  He is also worried  about the cost of regular followup and  being able to take Coumadin  consistently.  We, therefore, elected to continue aspirin at this time  realizing that this is not equivalent in terms of stroke prophylaxis to  Coumadin with his CHADS2 score of 2.  He voiced comfortable with this  and we discussed that this could be revisited down the road.   ALLERGIES:  No known drug allergies.   MEDICATIONS:  1. Aspirin 325 mg p.o. daily.  2. Metformin 500 mg p.o. b.i.d.  3. Vitamin B12 b.i.d.  4. Lisinopril 20 mg p.o. daily.  5. Simvastatin 10 mg p.o. daily.  6. Vicodin p.r.n.  7. Xanax p.r.n.   REVIEW OF SYSTEMS:  As described in the history of present illness.  Otherwise negative.   PHYSICAL EXAMINATION:  VITAL SIGNS:  Blood pressure today is 142/83,  heart rate is 73 and regular, weight is 217 pounds.  General:  This is an obese male in no acute distress.  Neck:  No elevated jugular venous pressure.  No audible bruits. No  thyromegaly is noted.  LUNGS:  Clear with diminished breath sounds.  No wheezing noted.  CARDIAC:  A largely regular rhythm with occasional irregular beat.  No  pathologic systolic murmur or pericardial rub.  No S3 gallop.  EXTREMITIES:  No frank pitting edema.   IMPRESSION AND RECOMMENDATIONS:  1. Persistent atrial flutter with controlled ventricular response,      duration uncertain, but potentially chronic.  CHADS2 score is 2      overall, based on hypertension and diabetes mellitus.  After      discussion outlined above, Dylan Tyler is most inclined to continue      with a full-dose aspirin in lieu of Coumadin at this time.  We will      plan to see him back over the next 6 months.  I suspect he likely      has some element of underlying conduction system disease given his      controlled ventricular response without medications and also the      fact that he manifested significant bradycardia on prior attempts      at a rate-lowering medications during his initial hospital      consultation.  He is  not describing any syncope or dizziness.  This      can be followed going forward.  2. Low-normal left ventricular ejection fraction of 50-55% with no      clear evidence of ischemia by recent Cardiolite study.  No further      ischemic evaluation at this point unless symptoms intervene.      Otherwise, anticipate continued efforts at risk factor modification      including smoking cessation (discussed today), hypertension and      diabetes control.  [name] will continue to follow with his      primary care physician, Dr. [name] in this regard.     [name], MD  Electronically Signed    SGM/MedQ  DD: 07/17/2008  DT: 07/18/2008  Job #: [id number]   cc:   Dr. [name]

## 2010-11-20 ENCOUNTER — Other Ambulatory Visit: Payer: Self-pay | Admitting: Cardiology

## 2010-11-20 NOTE — Telephone Encounter (Signed)
[facility] pt. 

## 2011-01-22 ENCOUNTER — Other Ambulatory Visit: Payer: Self-pay | Admitting: Cardiology

## 2011-01-22 NOTE — Telephone Encounter (Signed)
[facility]

## 2011-02-13 DIAGNOSIS — R079 Chest pain, unspecified: Secondary | ICD-10-CM

## 2011-03-18 ENCOUNTER — Ambulatory Visit: Payer: Self-pay | Admitting: Cardiology

## 2011-03-31 ENCOUNTER — Encounter: Payer: Self-pay | Admitting: Cardiology

## 2011-04-01 ENCOUNTER — Encounter: Payer: Self-pay | Admitting: Cardiology

## 2011-04-01 ENCOUNTER — Ambulatory Visit (INDEPENDENT_AMBULATORY_CARE_PROVIDER_SITE_OTHER): Payer: Medicare Other | Admitting: Cardiology

## 2011-04-01 ENCOUNTER — Encounter: Payer: Self-pay | Admitting: *Deleted

## 2011-04-01 VITALS — BP 140/79 | HR 76 | Resp 18 | Ht 69.0 in | Wt 232.0 lb

## 2011-04-01 DIAGNOSIS — I359 Nonrheumatic aortic valve disorder, unspecified: Secondary | ICD-10-CM

## 2011-04-01 DIAGNOSIS — I10 Essential (primary) hypertension: Secondary | ICD-10-CM

## 2011-04-01 DIAGNOSIS — F17200 Nicotine dependence, unspecified, uncomplicated: Secondary | ICD-10-CM

## 2011-04-01 DIAGNOSIS — I4892 Unspecified atrial flutter: Secondary | ICD-10-CM

## 2011-04-01 DIAGNOSIS — R079 Chest pain, unspecified: Secondary | ICD-10-CM

## 2011-04-01 DIAGNOSIS — E782 Mixed hyperlipidemia: Secondary | ICD-10-CM

## 2011-04-01 NOTE — Assessment & Plan Note (Signed)
Not significant asymptomatic in terms of palpitations, and rate control on medical therapy. He has preferred to stay on aspirin, rather than anticoagulation, although his CHADS2 score is 3. We have not pursued EP consultation for ablation in light of the fact that he is not particularly symptomatic. Can continue to discuss anticoagulation with him over time.

## 2011-04-01 NOTE — Assessment & Plan Note (Signed)
Continue medical therapy 

## 2011-04-01 NOTE — Assessment & Plan Note (Signed)
Patient continues to report chest pain symptoms, responsive to nitroglycerin, in fact was recently hospitalized at [facility]. He did rule out at that time and had a low risk Cardiolite, however his symptoms persist. In light of his significant risk factor profile, and following discussion with him today regarding the risks and benefits, plan is to arrange an outpatient diagnostic cardiac catheterization to assure that there are no obstructive stenoses that might respond to revascularization and lead to symptom improvement.

## 2011-04-01 NOTE — Assessment & Plan Note (Signed)
Smoking cessation clearly indicated. Continue to discuss.

## 2011-04-01 NOTE — Patient Instructions (Addendum)
   JV Heart Cath - see info sheet Follow up will be given at time of discharge from procedure above

## 2011-04-01 NOTE — Progress Notes (Signed)
Clinical Summary Mr. Dwinell is a 65 y.o.male presenting for followup. He was seen back in January.  Records indicate that he was recently admitted to [facility] in August, evaluated by the hospitalist service secondary to chest pain. He ruled out for myocardial infarction and underwent a Cardiolite study that demonstrated no ischemic ECG changes, LVEF 52%, and apparent inferior attenuation artifact. Chest x-ray revealed no active disease. This is similar to his evaluation from last year.  Unfortunately, he continues to manifest progressive chest pain symptoms, and reports responsiveness to nitroglycerin. Remote coronary angiography did not demonstrate any significant stenoses, although he has cardiac risk factors including age and gender, continuing tobacco abuse, hypertension, diabetes mellitus, and hyperlipidemia.  His atrial flutter persists, rate controlled on medical therapy, and he continues to prefer aspirin rather than anticoagulation despite his CHADS2 score. We have discussed this over time. He does not appear to be significantly symptomatic, and therefore we have not pursued electrophysiology consultation for ablation.  We discussed these issues today, including his recurrent chest pain symptoms, recent testing, and his continued concerns about the status of his heart. He would like to pursue repeat cardiac catheterization to be certain that there has been no major progression in disease that might require revascularization. We have discussed the risks and benefits, and this will be arranged for next week.  No Known Allergies  Medication list reviewed.  Past Medical History  Diagnosis Date  . Atrial flutter     Has preferred ASA to coumadin  . Diabetes mellitus type II   . Essential hypertension, benign   . Chronic pain     Followed at [facility]  . Hyperlipidemia   . Nonischemic cardiomyopathy     Cardiolite 12/09 LVEF 55%  . Aortic root dilatation     Mild  . AR (aortic  regurgitation)     Mild  . Diverticulitis     Past Surgical History  Procedure Date  . Knee arthroscopy     Right  . Laparoscopic cholecystectomy     No family history on file.  Social History Mr. Tatge reports that he has been smoking Cigarettes.  He has never used smokeless tobacco. Mr. Shrock reports that he drinks alcohol.  Review of Systems As outlined above, otherwise negative.  Physical Examination Filed Vitals:   04/01/11 1405  BP: 140/79  Pulse: 76  Resp: 18  Obese male in no acute distress. HEENT: Conjunctiva and lids normal, oropharynx with poor dentition. Neck: Supple, no elevated JVP or carotid bruits, no thyromegaly. Lungs: Diminished breath sounds throughout, nonlabored, no wheezing. Cardiac: Regular rate and rhythm, no S3 gallop or rub. Abdomen: Obese, nontender, bowel sounds present. Skin: Warm and dry, scattered tattoos. Extremities: No pitting edema, distal pulses one plus. Musculoskeletal: No kyphosis. Neuropsychiatric: Alert and oriented x3, ambulates with cane, affect grossly appropriate.   ECG Atrial flutter at 76 beats per minute with 4:1 conduction.  Studies Cardiolite 12/30/2009: Lexiscan Cardiolite with nondiagnostic ST segment changes, occasional PVCs, no chest pain. Evidence of soft tissue attenuation with moderate inferior defect, no clear ischemia, LVEF 54%.  Echocardiogram 05/21/2008: Mild LVH with LVEF 50-55%, mild LAE, mild AR, trace MR, RVSP 16 mmHg.    Problem List and Plan

## 2011-04-01 NOTE — Assessment & Plan Note (Signed)
Mild, with mild aortic root dilatation.

## 2011-04-06 ENCOUNTER — Telehealth: Payer: Self-pay | Admitting: *Deleted

## 2011-04-06 NOTE — Telephone Encounter (Signed)
Pt has Medicare only, no precert required.

## 2011-04-06 NOTE — Telephone Encounter (Signed)
Left JV cath scheduled for 10/5 at 10:30 with Dr. Acie Fredrickson

## 2011-04-10 ENCOUNTER — Inpatient Hospital Stay (HOSPITAL_BASED_OUTPATIENT_CLINIC_OR_DEPARTMENT_OTHER): Admission: RE | Admit: 2011-04-10 | Payer: Medicare Other | Source: Ambulatory Visit | Admitting: Cardiovascular Disease

## 2011-06-08 ENCOUNTER — Other Ambulatory Visit: Payer: Self-pay | Admitting: Cardiology

## 2011-06-08 NOTE — Telephone Encounter (Signed)
**Note De-identified Dylan Tyler Obfuscation** [place] pt. 

## 2011-06-18 ENCOUNTER — Other Ambulatory Visit: Payer: Self-pay | Admitting: Cardiology

## 2011-06-19 ENCOUNTER — Telehealth: Payer: Self-pay | Admitting: *Deleted

## 2011-06-19 DIAGNOSIS — R0602 Shortness of breath: Secondary | ICD-10-CM

## 2011-06-19 NOTE — Telephone Encounter (Signed)
Pt was previously scheduled for a cardiac cath on 04/10/11. He cancelled this cath d/t a death in the family. Pt was to call back to reschedule cath but has not done so at this time.   Left message to call back on voicemail to discuss further.

## 2011-06-23 ENCOUNTER — Telehealth: Payer: Self-pay | Admitting: *Deleted

## 2011-06-23 NOTE — Telephone Encounter (Signed)
Pt called Friday stating he would like cath done after January 3rd.   Scheduled 07/14/10 with Dr. Fletcher Anon to perform. Pt should arrive at 0830 for 0930 case.   Left message with male to have pt call when available. She states he was driving and he would call.

## 2011-06-23 NOTE — Telephone Encounter (Signed)
07/14/10   JV lab  (L) hrt cath  Dr. Fletcher Anon to perform

## 2011-06-23 NOTE — Telephone Encounter (Signed)
No precert required 

## 2011-06-25 ENCOUNTER — Telehealth: Payer: Self-pay | Admitting: *Deleted

## 2011-06-25 NOTE — Telephone Encounter (Signed)
Cath (L) JV lab 07/14/10 w/arida

## 2011-06-25 NOTE — Telephone Encounter (Signed)
Pt notified and verbalized understanding.  Lab orders and instruction letter will be mailed to pt.

## 2011-06-26 NOTE — Telephone Encounter (Signed)
Dr. Domenic Polite would like to see pt in the office before rescheduling cath. Pt notified. Scheduled with Gene on 07/17/11. Pt aware of appt. Spoke with Legrand Como in the cath lab to cancel cath for 1/9. We will r/s at time of office visit.

## 2011-07-15 ENCOUNTER — Encounter (HOSPITAL_BASED_OUTPATIENT_CLINIC_OR_DEPARTMENT_OTHER): Payer: Self-pay

## 2011-07-15 ENCOUNTER — Inpatient Hospital Stay (HOSPITAL_BASED_OUTPATIENT_CLINIC_OR_DEPARTMENT_OTHER): Admit: 2011-07-15 | Payer: Self-pay | Admitting: Cardiovascular Disease

## 2011-07-15 SURGERY — JV LEFT HEART CATHETERIZATION WITH CORONARY ANGIOGRAM
Anesthesia: Moderate Sedation

## 2011-07-17 ENCOUNTER — Telehealth: Payer: Self-pay | Admitting: *Deleted

## 2011-07-17 ENCOUNTER — Encounter: Payer: Self-pay | Admitting: *Deleted

## 2011-07-17 ENCOUNTER — Encounter: Payer: Self-pay | Admitting: Physician Assistant

## 2011-07-17 ENCOUNTER — Other Ambulatory Visit: Payer: Self-pay | Admitting: Physician Assistant

## 2011-07-17 ENCOUNTER — Ambulatory Visit (INDEPENDENT_AMBULATORY_CARE_PROVIDER_SITE_OTHER): Payer: Medicare Other | Admitting: Physician Assistant

## 2011-07-17 VITALS — BP 119/81 | HR 83 | Ht 69.0 in | Wt 230.0 lb

## 2011-07-17 DIAGNOSIS — I4892 Unspecified atrial flutter: Secondary | ICD-10-CM

## 2011-07-17 DIAGNOSIS — R079 Chest pain, unspecified: Secondary | ICD-10-CM

## 2011-07-17 DIAGNOSIS — R0602 Shortness of breath: Secondary | ICD-10-CM

## 2011-07-17 DIAGNOSIS — R072 Precordial pain: Secondary | ICD-10-CM

## 2011-07-17 DIAGNOSIS — I10 Essential (primary) hypertension: Secondary | ICD-10-CM

## 2011-07-17 DIAGNOSIS — E785 Hyperlipidemia, unspecified: Secondary | ICD-10-CM

## 2011-07-17 NOTE — Assessment & Plan Note (Signed)
Well-controlled on current medication regimen 

## 2011-07-17 NOTE — Patient Instructions (Signed)
Your physician recommends that you go to the [facility] for lab work/chest x-ray on January 30th or 31st.  Your physician recommends that you continue on your current medications as directed. Please refer to the Current Medication list given to you today. Your physician has requested that you have a cardiac catheterization. Cardiac catheterization is used to diagnose and/or treat various heart conditions. Doctors may recommend this procedure for a number of different reasons. The most common reason is to evaluate chest pain. Chest pain can be a symptom of coronary artery disease (CAD), and cardiac catheterization can show whether plaque is narrowing or blocking your heart's arteries. This procedure is also used to evaluate the valves, as well as measure the blood flow and oxygen levels in different parts of your heart. For further information please visit [email or web address]. Please follow instruction sheet, as given.

## 2011-07-17 NOTE — Assessment & Plan Note (Signed)
Adequately rate controlled on current medication regimen. Continue low-dose aspirin, per patient preference, as previously outlined.

## 2011-07-17 NOTE — Telephone Encounter (Signed)
AUTH# [id number] STILL VALID THRU 08-20-11

## 2011-07-17 NOTE — Progress Notes (Signed)
HPI: Patient presents to discuss prior recommendation, by Dr. Domenic Polite, for diagnostic cardiac catheterization. Of note, this procedure has been canceled on 2 prior occasions, initially back in October and, more recently, only 2 days ago.   Clinically, the patient continues to have occasional chest pain; however, there is no strict correlation to exertion. In fact, he oftentimes has a "ping" sensation, underlying the left breast, which is unpredictable in onset, and lasts only a few seconds in duration. His salient complaint is that of DOE, which seems to have progressed, over these last few months.  Patient has numerous cardiac risk factors, including DM and ongoing tobacco, and had a remote cardiac catheterization, at [facility], which was reportedly negative. He is agreeable to proceed with this procedure, at this point in time.   No Known Allergies  Current Outpatient Prescriptions  Medication Sig Dispense Refill  . ALPRAZolam (XANAX) 1 MG tablet Take 1 mg by mouth 3 (three) times daily.        Marland Kitchen amLODipine (NORVASC) 10 MG tablet Take 5 mg by mouth daily.        Marland Kitchen aspirin 81 MG tablet Take 81 mg by mouth daily.        . carisoprodol (SOMA) 350 MG tablet Take 350 mg by mouth 2 (two) times daily.        . cholestyramine (QUESTRAN) 4 G packet Take 1 packet by mouth daily.        . citalopram (CELEXA) 40 MG tablet Take 20 mg by mouth daily.       . DULoxetine (CYMBALTA) 30 MG capsule Take 30 mg by mouth daily.        . Ipratropium-Albuterol (DUONEB IN) Inhale into the lungs 4 (four) times daily.        Marland Kitchen lisinopril (PRINIVIL,ZESTRIL) 20 MG tablet TAKE 1 TABLET ONCE DAILY  30 tablet  6  . metFORMIN (GLUCOPHAGE) 500 MG tablet Take 500 mg by mouth 2 (two) times daily.        . metoCLOPramide (REGLAN) 5 MG tablet Take 5 mg by mouth as needed.        . nitroGLYCERIN (NITROSTAT) 0.4 MG SL tablet Place 0.4 mg under the tongue every 5 (five) minutes as needed. May repeat for up to 3 doses.        . NON FORMULARY CPAP qhs       . omeprazole (PRILOSEC) 20 MG capsule Take 20 mg by mouth daily.        Marland Kitchen oxyCODONE-acetaminophen (PERCOCET) 5-325 MG per tablet Take 1 tablet by mouth daily.        . pregabalin (LYRICA) 75 MG capsule Take 50 mg by mouth daily.       . simvastatin (ZOCOR) 20 MG tablet Take 20 mg by mouth at bedtime.        . simvastatin (ZOCOR) 40 MG tablet TAKE 1 TABLET EVERY BEDTIME  30 tablet  6  . vitamin B-12 (CYANOCOBALAMIN) 1000 MCG tablet Take 1,000 mcg by mouth 2 (two) times daily.        . Vitamin D, Ergocalciferol, (DRISDOL) 50000 UNITS CAPS Take 50,000 Units by mouth once a week.          Past Medical History  Diagnosis Date  . Atrial flutter     Has preferred ASA to coumadin  . Diabetes mellitus type II   . Essential hypertension, benign   . Chronic pain     Followed at [facility]  . Hyperlipidemia   .  Nonischemic cardiomyopathy     Cardiolite 12/09 LVEF 55%  . Aortic root dilatation     Mild  . AR (aortic regurgitation)     Mild  . Diverticulitis     History   Social History  . Marital Status: Divorced    Spouse Name: N/A    Number of Children: N/A  . Years of Education: N/A   Occupational History  . Disabled    Social History Main Topics  . Smoking status: Current Everyday Smoker    Types: Cigarettes  . Smokeless tobacco: Never Used  . Alcohol Use: Yes  . Drug Use: Yes    Special: Cocaine     Former use of cocaine  . Sexually Active: Not on file   Other Topics Concern  . Not on file   Social History Narrative   DivorcedNo regular exercise    No family history on file.  ROS: no nausea, vomiting; no fever, chills; no melena, hematochezia; no claudication  PHYSICAL EXAM:  There were no vitals taken for this visit. GENERAL: 66 year-old male, obese, sitting upright; NAD HEENT: NCAT, PERRLA, EOMI; sclera clear; no xanthelasma NECK: palpable bilateral carotid pulses, no bruits; no JVD; no TM LUNGS: CTA bilaterally CARDIAC: RRR  (S1, S2); no significant murmurs; no rubs or gallops ABDOMEN:  protuberant; soft, non-tender; intact BS EXTREMETIES:  palpable bilateral femoral pulses, without bruits; intact distal pulses; no significant peripheral edema SKIN: warm/dry; no obvious rash/lesions MUSCULOSKELETAL: no joint deformity NEURO: no focal deficit; NL affect   EKG: reviewed and available in Electronic Records   ASSESSMENT & PLAN:

## 2011-07-17 NOTE — Telephone Encounter (Signed)
JV cath  Dr. Fletcher Anon (L) heart 08/12/11

## 2011-07-17 NOTE — Assessment & Plan Note (Signed)
Continue current dose of simvastatin. Recommended target LDL 70 or less, if feasible. We'll reevaluate lipid status, following cardiac catheterization.

## 2011-07-17 NOTE — Assessment & Plan Note (Addendum)
Patient is now amenable to proceed with a cardiac catheterization, for definitive exclusion of significant CAD, with preference for it to be scheduled after February 5. Although he presents with some symptoms of atypical chest pain, he presents with numerous risk factors, including DM and ongoing tobacco smoking He also had a low risk Cardiolite in August 2012, following hospitalization with CP. He reportedly had a negative cardiac catheterization at [facility], approximately 20 years ago. The risks/benefits of the procedure were discussed, in conjunction with Dr Domenic Polite.

## 2011-08-12 ENCOUNTER — Inpatient Hospital Stay (HOSPITAL_BASED_OUTPATIENT_CLINIC_OR_DEPARTMENT_OTHER)
Admission: RE | Admit: 2011-08-12 | Discharge: 2011-08-12 | Disposition: A | Discharge status: Home / Self Care | Payer: Medicare Other | Source: Ambulatory Visit | Attending: Cardiovascular Disease | Admitting: Cardiovascular Disease

## 2011-08-12 ENCOUNTER — Encounter (HOSPITAL_BASED_OUTPATIENT_CLINIC_OR_DEPARTMENT_OTHER)
Admission: RE | Disposition: A | Discharge status: Home / Self Care | Payer: Self-pay | Source: Ambulatory Visit | Attending: Cardiovascular Disease

## 2011-08-12 DIAGNOSIS — R079 Chest pain, unspecified: Secondary | ICD-10-CM | POA: Insufficient documentation

## 2011-08-12 DIAGNOSIS — I2510 Atherosclerotic heart disease of native coronary artery without angina pectoris: Secondary | ICD-10-CM

## 2011-08-12 LAB — POCT I-STAT GLUCOSE: Operator id: 141321

## 2011-08-12 SURGERY — JV LEFT HEART CATHETERIZATION WITH CORONARY ANGIOGRAM
Anesthesia: Moderate Sedation

## 2011-08-12 MED ORDER — ASPIRIN 81 MG PO CHEW
324.0000 mg | CHEWABLE_TABLET | ORAL | Status: AC
  Administered 2011-08-12: 324 mg via ORAL

## 2011-08-12 MED ORDER — DIAZEPAM 5 MG PO TABS
5.0000 mg | ORAL_TABLET | ORAL | Status: AC
  Administered 2011-08-12: 5 mg via ORAL

## 2011-08-12 MED ORDER — ACETAMINOPHEN 325 MG PO TABS: 650.0000 mg | ORAL_TABLET | ORAL | Status: DC | PRN

## 2011-08-12 MED ORDER — SODIUM CHLORIDE 0.9 % IV SOLN: INTRAVENOUS | Status: AC

## 2011-08-12 MED ORDER — SODIUM CHLORIDE 0.9 % IV SOLN
INTRAVENOUS | Status: DC
  Administered 2011-08-12: 08:00:00 via INTRAVENOUS

## 2011-08-12 NOTE — Progress Notes (Signed)
Discharge instructions completed, ambulated to bathroom without bleeding from right groin site.  Discharged to home via wheelchair with significant other.

## 2011-08-12 NOTE — Op Note (Signed)
Cardiac Catheterization Procedure Note  Name: Dylan Tyler MRN: [id number] DOB: 03-18-1946  Procedure: Left Heart Cath, Selective Coronary Angiography, LV angiography  Indication: Recurrent chest pain and multiple risk factors for coronary artery disease.   Medications:  Sedation:  1 mg IV Versed, 25 mcg IV Fentanyl  Contrast:  85 mL Omnipaque  Procedural details: The right groin was prepped, draped, and anesthetized with 1% lidocaine. Using modified Seldinger technique, a 4 French sheath was introduced into the right femoral artery. Standard Judkins catheters were used for coronary angiography and left ventriculography. Catheter exchanges were performed over a guidewire. There were no immediate procedural complications. The patient was transferred to the post catheterization recovery area for further monitoring.   Procedural Findings:  Hemodynamics: AO:  105/61   mmHg LV:  106/12    mmHg LVEDP: 15  mmHg  Coronary angiography: Coronary dominance: Right   Left Main:  Large in diameter and free of significant disease.  Left Anterior Descending (LAD):  The vessel is normal in size with mild calcifications in the midsegment. There are minor irregularities distally but no evidence of obstructive disease.  1st diagonal (D1):  Small in size and free of significant disease.  2nd diagonal (D2):  Normal in size and free of significant disease.  3rd diagonal (D3):  Small size.  Circumflex (LCx):  Normal in size and nondominant. There are minor irregularities in the midsegment but otherwise no evidence of obstructive disease.  1st obtuse marginal:  Small in size with minor irregularities.  2nd obtuse marginal:  Normal in size without significant disease.  3rd obtuse marginal:  Medium in size and free of significant disease.   Right Coronary Artery: Normal in size and dominant. It's significantly tortuous and the midsegment. The vessel has minor irregularities without evidence of  obstructive disease.  right ventricle branch of right coronary artery: The distal RV marginal supplies the PDA distribution and has minor irregularities.  posterior descending artery: Very small in size.  posterior lateral branch:  2 small branches with minor irregularities. Left ventriculography: Left ventricular systolic function is normal , LVEF is estimated at 60 %, there is no significant mitral regurgitation   Final Conclusions:   1. Minor irregularities without evidence of obstructive coronary artery disease. 2. Normal LV systolic function with mildly elevated LVEDP.  Recommendations: The patient's chest pain does not seem to be related to obstructive coronary artery disease. Continue medical therapy. The patient should strongly consider anticoagulation for atrial fibrillation/flutter.  Kathlyn Sacramento MD, [facility] 08/12/2011, 8:56 AM

## 2011-08-12 NOTE — H&P (View-Only) (Signed)
HPI: Patient presents to discuss prior recommendation, by Dr. Domenic Polite, for diagnostic cardiac catheterization. Of note, this procedure has been canceled on 2 prior occasions, initially back in October and, more recently, only 2 days ago.   Clinically, the patient continues to have occasional chest pain; however, there is no strict correlation to exertion. In fact, he oftentimes has a "ping" sensation, underlying the left breast, which is unpredictable in onset, and lasts only a few seconds in duration. His salient complaint is that of DOE, which seems to have progressed, over these last few months.  Patient has numerous cardiac risk factors, including DM and ongoing tobacco, and had a remote cardiac catheterization, at [facility], which was reportedly negative. He is agreeable to proceed with this procedure, at this point in time.   No Known Allergies  Current Outpatient Prescriptions  Medication Sig Dispense Refill  . ALPRAZolam (XANAX) 1 MG tablet Take 1 mg by mouth 3 (three) times daily.        Marland Kitchen amLODipine (NORVASC) 10 MG tablet Take 5 mg by mouth daily.        Marland Kitchen aspirin 81 MG tablet Take 81 mg by mouth daily.        . carisoprodol (SOMA) 350 MG tablet Take 350 mg by mouth 2 (two) times daily.        . cholestyramine (QUESTRAN) 4 G packet Take 1 packet by mouth daily.        . citalopram (CELEXA) 40 MG tablet Take 20 mg by mouth daily.       . DULoxetine (CYMBALTA) 30 MG capsule Take 30 mg by mouth daily.        . Ipratropium-Albuterol (DUONEB IN) Inhale into the lungs 4 (four) times daily.        Marland Kitchen lisinopril (PRINIVIL,ZESTRIL) 20 MG tablet TAKE 1 TABLET ONCE DAILY  30 tablet  6  . metFORMIN (GLUCOPHAGE) 500 MG tablet Take 500 mg by mouth 2 (two) times daily.        . metoCLOPramide (REGLAN) 5 MG tablet Take 5 mg by mouth as needed.        . nitroGLYCERIN (NITROSTAT) 0.4 MG SL tablet Place 0.4 mg under the tongue every 5 (five) minutes as needed. May repeat for up to 3 doses.        . NON FORMULARY CPAP qhs       . omeprazole (PRILOSEC) 20 MG capsule Take 20 mg by mouth daily.        Marland Kitchen oxyCODONE-acetaminophen (PERCOCET) 5-325 MG per tablet Take 1 tablet by mouth daily.        . pregabalin (LYRICA) 75 MG capsule Take 50 mg by mouth daily.       . simvastatin (ZOCOR) 20 MG tablet Take 20 mg by mouth at bedtime.        . simvastatin (ZOCOR) 40 MG tablet TAKE 1 TABLET EVERY BEDTIME  30 tablet  6  . vitamin B-12 (CYANOCOBALAMIN) 1000 MCG tablet Take 1,000 mcg by mouth 2 (two) times daily.        . Vitamin D, Ergocalciferol, (DRISDOL) 50000 UNITS CAPS Take 50,000 Units by mouth once a week.          Past Medical History  Diagnosis Date  . Atrial flutter     Has preferred ASA to coumadin  . Diabetes mellitus type II   . Essential hypertension, benign   . Chronic pain     Followed at [facility]  . Hyperlipidemia   .  Nonischemic cardiomyopathy     Cardiolite 12/09 LVEF 55%  . Aortic root dilatation     Mild  . AR (aortic regurgitation)     Mild  . Diverticulitis     History   Social History  . Marital Status: Divorced    Spouse Name: N/A    Number of Children: N/A  . Years of Education: N/A   Occupational History  . Disabled    Social History Main Topics  . Smoking status: Current Everyday Smoker    Types: Cigarettes  . Smokeless tobacco: Never Used  . Alcohol Use: Yes  . Drug Use: Yes    Special: Cocaine     Former use of cocaine  . Sexually Active: Not on file   Other Topics Concern  . Not on file   Social History Narrative   DivorcedNo regular exercise    No family history on file.  ROS: no nausea, vomiting; no fever, chills; no melena, hematochezia; no claudication  PHYSICAL EXAM:  There were no vitals taken for this visit. GENERAL: 66 year-old male, obese, sitting upright; NAD HEENT: NCAT, PERRLA, EOMI; sclera clear; no xanthelasma NECK: palpable bilateral carotid pulses, no bruits; no JVD; no TM LUNGS: CTA bilaterally CARDIAC: RRR  (S1, S2); no significant murmurs; no rubs or gallops ABDOMEN:  protuberant; soft, non-tender; intact BS EXTREMETIES:  palpable bilateral femoral pulses, without bruits; intact distal pulses; no significant peripheral edema SKIN: warm/dry; no obvious rash/lesions MUSCULOSKELETAL: no joint deformity NEURO: no focal deficit; NL affect   EKG: reviewed and available in Electronic Records   ASSESSMENT & PLAN:

## 2011-08-12 NOTE — Interval H&P Note (Signed)
History and Physical Interval Note:  08/12/2011 8:30 AM  Dylan Tyler  has presented today for surgery, with the diagnosis of cp  The various methods of treatment have been discussed with the patient. After consideration of risks, benefits and other options for treatment, the patient has consented to  Procedure(s): [place] as a surgical intervention .  The patients' history has been reviewed, patient examined, no change in status, stable for surgery.  I have reviewed the patients' chart and labs.  Questions were answered to the patient's satisfaction.     Kathlyn Sacramento

## 2011-08-12 NOTE — Progress Notes (Signed)
Bedrest begins @ [id number].

## 2011-08-13 HISTORY — PX: CARDIAC CATHETERIZATION: SHX172

## 2011-09-01 ENCOUNTER — Encounter: Payer: Self-pay | Admitting: Cardiology

## 2011-09-01 ENCOUNTER — Ambulatory Visit (INDEPENDENT_AMBULATORY_CARE_PROVIDER_SITE_OTHER): Payer: Medicare Other | Admitting: Cardiology

## 2011-09-01 VITALS — BP 136/74 | HR 69 | Ht 69.0 in | Wt 234.0 lb

## 2011-09-01 DIAGNOSIS — F17200 Nicotine dependence, unspecified, uncomplicated: Secondary | ICD-10-CM

## 2011-09-01 DIAGNOSIS — I10 Essential (primary) hypertension: Secondary | ICD-10-CM

## 2011-09-01 DIAGNOSIS — I4892 Unspecified atrial flutter: Secondary | ICD-10-CM

## 2011-09-01 DIAGNOSIS — E785 Hyperlipidemia, unspecified: Secondary | ICD-10-CM

## 2011-09-01 DIAGNOSIS — R079 Chest pain, unspecified: Secondary | ICD-10-CM

## 2011-09-01 NOTE — Assessment & Plan Note (Signed)
Persistent, 4:1 conduction, asymptomatic. We continue to discuss potential anticoagulant therapy for stroke prophylaxis. At this point he still wants to remain on aspirin.

## 2011-09-01 NOTE — Assessment & Plan Note (Signed)
Recent cardiac catheterization showing a only minor luminal irregularities, very reassuring. We discussed this today. Plan to continue medical therapy. Could be that his COPD with continued tobacco use is a big component of some of these symptoms.

## 2011-09-01 NOTE — Assessment & Plan Note (Signed)
We continue to address tobacco cessation.

## 2011-09-01 NOTE — Progress Notes (Signed)
Clinical Summary Dylan Tyler is a 65 y.o.male presenting for followup. He was last seen in January. He was referred for a diagnostic cardiac catheterization in light of continued intermittent chest pain symptoms despite prior reassuring nonischemic workup. Fortunately, his cardiac catheterization done just recently on 2/6 demonstrated only minor luminal irregularities. Review the results again today.  He is here with his wife. States he has been feeling better in general. He reports no palpitations. Still has shortness of breath related to his COPD, continued tobacco use. He has been using his nebulizer treatments.  We discussed again the potential for anticoagulation in light of his chronic atrial flutter for stroke prophylaxis. He still remains hesitant about using anticoagulants, prefers to stay on aspirin for now. He did say he would consider it further however between now and the next visit.  No Known Allergies  Current Outpatient Prescriptions  Medication Sig Dispense Refill  . ALPRAZolam (XANAX) 1 MG tablet Take 1 mg by mouth 3 (three) times daily.        Marland Kitchen amLODipine (NORVASC) 10 MG tablet Take 5 mg by mouth daily.        Marland Kitchen aspirin 81 MG tablet Take 81 mg by mouth daily.        . carisoprodol (SOMA) 350 MG tablet Take 350 mg by mouth 2 (two) times daily.        . cholestyramine (QUESTRAN) 4 G packet Take 1 packet by mouth daily as needed.       . citalopram (CELEXA) 40 MG tablet Take 20 mg by mouth daily.       . Ipratropium-Albuterol (DUONEB IN) Inhale into the lungs 4 (four) times daily.        Marland Kitchen lisinopril (PRINIVIL,ZESTRIL) 20 MG tablet TAKE 1 TABLET ONCE DAILY  30 tablet  6  . metFORMIN (GLUCOPHAGE) 500 MG tablet Take 500 mg by mouth 2 (two) times daily.        . metoCLOPramide (REGLAN) 5 MG tablet Take 5 mg by mouth as needed.        . nitroGLYCERIN (NITROSTAT) 0.4 MG SL tablet Place 0.4 mg under the tongue every 5 (five) minutes as needed. May repeat for up to 3 doses.       .  NON FORMULARY CPAP qhs       . omeprazole (PRILOSEC) 20 MG capsule Take 20 mg by mouth daily.        Marland Kitchen oxyCODONE-acetaminophen (PERCOCET) 7.5-325 MG per tablet Take 1 tablet by mouth 2 (two) times daily.      . pregabalin (LYRICA) 50 MG capsule Take 50 mg by mouth every morning.      . pregabalin (LYRICA) 75 MG capsule Take 75 mg by mouth at bedtime.       . simvastatin (ZOCOR) 20 MG tablet Take 20 mg by mouth at bedtime.        . vitamin B-12 (CYANOCOBALAMIN) 1000 MCG tablet Take 1,000 mcg by mouth 2 (two) times daily.        . Vitamin D, Ergocalciferol, (DRISDOL) 50000 UNITS CAPS Take 50,000 Units by mouth once a week.        . DULoxetine (CYMBALTA) 30 MG capsule Take 30 mg by mouth daily.          Past Medical History  Diagnosis Date  . Atrial flutter     Has preferred ASA to coumadin  . Diabetes mellitus type II   . Essential hypertension, benign   . Chronic pain  Followed at [facility]  . Hyperlipidemia   . Nonischemic cardiomyopathy     Cardiolite 12/09 LVEF 55%, minor luminal irregularities at cardiac catheterization  . Aortic root dilatation     Mild  . AR (aortic regurgitation)     Mild  . Diverticulitis     Past Surgical History  Procedure Date  . Knee arthroscopy     Right  . Laparoscopic cholecystectomy   . Cardiac catheterization 08/13/11    History reviewed. No pertinent family history.  Social History Dylan Tyler reports that he has been smoking Cigarettes.  He has a 15 pack-year smoking history. He has quit using smokeless tobacco. His smokeless tobacco use included Chew. Dylan Tyler reports that he drinks alcohol.  Review of Systems Chronic arthritic pain. Stable appetite. Otherwise reviewed and negative except as outlined.  Physical Examination Filed Vitals:   09/01/11 0934  BP: 136/74  Pulse: 69    Obese male in no acute distress.  HEENT: Conjunctiva and lids normal, oropharynx with poor dentition.  Neck: Supple, no elevated JVP or carotid bruits,  no thyromegaly.  Lungs: Diminished breath sounds throughout, nonlabored, no wheezing.  Cardiac: Regular rate and rhythm, no S3 gallop or rub.  Abdomen: Obese, nontender, bowel sounds present.  Skin: Warm and dry, scattered tattoos.  Extremities: No pitting edema, distal pulses one plus.  Musculoskeletal: No kyphosis.  Neuropsychiatric: Alert and oriented x3, ambulates with cane, affect grossly appropriate.   ECG Atrial flutter at 69 with 4:1 block, nonspecific ST-T changes.   Problem List and Plan

## 2011-09-01 NOTE — Assessment & Plan Note (Signed)
No change to current regimen. 

## 2011-09-01 NOTE — Patient Instructions (Signed)
Your physician recommends that you schedule a follow-up appointment in: 6 months. You will receive a reminder letter in the mail about 1-2 months in advance. If you don't receive this letter, please contact our office.  Your physician recommends that you continue on your current medications as directed. Please refer to the Current Medication list given to you today.

## 2011-09-01 NOTE — Assessment & Plan Note (Signed)
Continue present regimen.

## 2012-01-15 ENCOUNTER — Other Ambulatory Visit: Payer: Self-pay | Admitting: Cardiology

## 2012-01-15 NOTE — Telephone Encounter (Signed)
[facility] pt. 

## 2012-05-24 ENCOUNTER — Encounter: Payer: Self-pay | Admitting: Cardiology

## 2012-07-01 ENCOUNTER — Telehealth: Payer: Self-pay | Admitting: *Deleted

## 2012-07-01 ENCOUNTER — Other Ambulatory Visit: Payer: Self-pay | Admitting: Cardiology

## 2012-07-01 NOTE — Telephone Encounter (Signed)
Received refill request.  Patient past due for follow up in [place].  Appointment made for February.  Patient made aware.

## 2012-08-31 ENCOUNTER — Ambulatory Visit (INDEPENDENT_AMBULATORY_CARE_PROVIDER_SITE_OTHER): Payer: Medicare Other | Admitting: Cardiology

## 2012-08-31 ENCOUNTER — Encounter: Payer: Self-pay | Admitting: Cardiology

## 2012-08-31 VITALS — BP 132/71 | HR 86 | Ht 69.0 in | Wt 231.0 lb

## 2012-08-31 DIAGNOSIS — F17200 Nicotine dependence, unspecified, uncomplicated: Secondary | ICD-10-CM

## 2012-08-31 DIAGNOSIS — I4892 Unspecified atrial flutter: Secondary | ICD-10-CM

## 2012-08-31 DIAGNOSIS — E785 Hyperlipidemia, unspecified: Secondary | ICD-10-CM

## 2012-08-31 DIAGNOSIS — R079 Chest pain, unspecified: Secondary | ICD-10-CM

## 2012-08-31 NOTE — Assessment & Plan Note (Signed)
Atypical, cardiac catheterization from last February showed only minor coronary luminal irregularities.

## 2012-08-31 NOTE — Assessment & Plan Note (Signed)
Smoking cessation discussed over time.

## 2012-08-31 NOTE — Assessment & Plan Note (Signed)
Permanent, and rate controlled at baseline. He continues to prefer aspirin to anticoagulant therapy, although we continue to discuss stroke prophylaxis over time. No changes made to current regimen.

## 2012-08-31 NOTE — Progress Notes (Signed)
Clinical Summary [name] is a 67 y.o.male presenting for followup. He was seen in February 2013. Can't he is here with his wife today. He reports no progression and sense of palpitations, has occasional atypical, brief episodes of chest pain.  ECG today shows atrial flutter with predominantly 4:1 block. He continues to prefer aspirin to anticoagulant therapy, although we do discuss this at each visit.   He follows with Dr.Doonquah for chronic pain management.   No Known Allergies  Current Outpatient Prescriptions  Medication Sig Dispense Refill  . albuterol (PROVENTIL) (2.5 MG/3ML) 0.083% nebulizer solution Take 2.5 mg by nebulization every 6 (six) hours as needed for wheezing.      Marland Kitchen albuterol-ipratropium (COMBIVENT) 18-103 MCG/ACT inhaler Inhale 2 puffs into the lungs 4 (four) times daily.      Marland Kitchen ALPRAZolam (XANAX) 1 MG tablet Take 1 mg by mouth 3 (three) times daily.        Marland Kitchen amLODipine (NORVASC) 10 MG tablet Take 5 mg by mouth daily.        Marland Kitchen aspirin 81 MG tablet Take 81 mg by mouth daily.        . carisoprodol (SOMA) 350 MG tablet Take 350 mg by mouth 2 (two) times daily.        . cholestyramine (QUESTRAN) 4 G packet Take 1 packet by mouth daily as needed.       . citalopram (CELEXA) 40 MG tablet Take 20 mg by mouth daily.       Marland Kitchen imipramine (TOFRANIL) 25 MG tablet Take 25 mg by mouth 2 (two) times daily.      . Ipratropium-Albuterol (DUONEB IN) Inhale into the lungs 4 (four) times daily.        Marland Kitchen lisinopril (PRINIVIL,ZESTRIL) 20 MG tablet TAKE ONE TABLET ONCE DAILY  30 tablet  3  . metFORMIN (GLUCOPHAGE) 500 MG tablet Take 500 mg by mouth 2 (two) times daily.        . metoCLOPramide (REGLAN) 5 MG tablet Take 5 mg by mouth as needed.        . nitroGLYCERIN (NITROSTAT) 0.4 MG SL tablet Place 0.4 mg under the tongue every 5 (five) minutes as needed. May repeat for up to 3 doses.       . NON FORMULARY CPAP qhs       . omeprazole (PRILOSEC) 20 MG capsule Take 20 mg by mouth daily.         Marland Kitchen oxyCODONE-acetaminophen (PERCOCET) 7.5-325 MG per tablet Take 1 tablet by mouth 2 (two) times daily.      . pregabalin (LYRICA) 75 MG capsule Take 75 mg by mouth 3 (three) times daily.       . simvastatin (ZOCOR) 40 MG tablet       . vitamin B-12 (CYANOCOBALAMIN) 1000 MCG tablet Take 1,000 mcg by mouth 2 (two) times daily.        . Vitamin D, Ergocalciferol, (DRISDOL) 50000 UNITS CAPS Take 50,000 Units by mouth once a week.        . VOLTAREN 1 % GEL Apply 2 g topically 4 (four) times daily as needed.        No current facility-administered medications for this visit.    Past Medical History  Diagnosis Date  . Atrial flutter     Has preferred ASA to coumadin  . Diabetes mellitus type II   . Essential hypertension, benign   . Chronic pain     Followed at [facility]  . Hyperlipidemia   .  Nonischemic cardiomyopathy     Cardiolite 12/09 LVEF 55%, minor luminal irregularities at cardiac catheterization  . Aortic root dilatation     Mild  . AR (aortic regurgitation)     Mild  . Diverticulitis     Social History [name] reports that he has been smoking Cigarettes.  He has a 15 pack-year smoking history. He has quit using smokeless tobacco. His smokeless tobacco use included Chew. [name] reports that  drinks alcohol.  Review of Systems Reports easy bruising, no major bleeding episodes. Uses a cane to walk with chronic leg and back pain. Otherwise negative.  Physical Examination Filed Vitals:   08/31/12 1358  BP: 132/71  Pulse: 86   Filed Weights   08/31/12 1358  Weight: 231 lb (104.781 kg)    No acute distress.  HEENT: Conjunctiva and lids normal, oropharynx with poor dentition.  Neck: Supple, no elevated JVP or carotid bruits, no thyromegaly.  Lungs: Diminished breath sounds throughout, nonlabored, no wheezing.  Cardiac: Largely regular rate and rhythm, no S3 gallop or rub.  Abdomen: Obese, nontender, bowel sounds present.  Skin: Warm and dry, scattered tattoos.   Extremities: No pitting edema, distal pulses one plus.    Problem List and Plan   ATRIAL FLUTTER Permanent, and rate controlled at baseline. He continues to prefer aspirin to anticoagulant therapy, although we continue to discuss stroke prophylaxis over time. No changes made to current regimen.  TOBACCO ABUSE Smoking cessation discussed over time.  Recurrent chest pain Atypical, cardiac catheterization from last February showed only minor coronary luminal irregularities.  HYPERLIPIDEMIA-MIXED Continues on Zocor, followed by Dr. [name].    [name], M.D., F.A.C.C.

## 2012-08-31 NOTE — Patient Instructions (Addendum)

## 2012-08-31 NOTE — Assessment & Plan Note (Signed)
Continues on Zocor, followed by Dr. [name].

## 2013-03-25 ENCOUNTER — Other Ambulatory Visit: Payer: Self-pay | Admitting: Cardiology

## 2014-03-09 ENCOUNTER — Encounter: Payer: Self-pay | Admitting: Cardiology

## 2014-03-09 ENCOUNTER — Ambulatory Visit (INDEPENDENT_AMBULATORY_CARE_PROVIDER_SITE_OTHER): Payer: Medicare HMO | Admitting: Cardiology

## 2014-03-09 VITALS — BP 110/60 | HR 97 | Wt 237.0 lb

## 2014-03-09 DIAGNOSIS — I4892 Unspecified atrial flutter: Secondary | ICD-10-CM

## 2014-03-09 DIAGNOSIS — I10 Essential (primary) hypertension: Secondary | ICD-10-CM

## 2014-03-09 DIAGNOSIS — F17200 Nicotine dependence, unspecified, uncomplicated: Secondary | ICD-10-CM

## 2014-03-09 DIAGNOSIS — I484 Atypical atrial flutter: Secondary | ICD-10-CM

## 2014-03-09 NOTE — Patient Instructions (Signed)
Continue all current medications. Your physician wants you to follow up in: 6 months.  You will receive a reminder letter in the mail one-two months in advance.  If you don't receive a letter, please call our office to schedule the follow up appointment   

## 2014-03-09 NOTE — Progress Notes (Signed)
Clinical Summary Dylan Tyler is a 68 y.o.male last seen in February 2014. He is here with his wife. He has been stable overall from a cardiac perspective, no palpitations or change in chronic shortness of breath. Main complaints are orthopedic in nature.  He is continued on aspirin with chronic atrial flutter that is rate controlled. We have discussed options for anticoagulation over time. Actually, today's ECG shows atrial fibrillation with controlled ventricular response.    No Known Allergies  Current Outpatient Prescriptions  Medication Sig Dispense Refill  . albuterol (PROVENTIL) (2.5 MG/3ML) 0.083% nebulizer solution Take 2.5 mg by nebulization every 6 (six) hours as needed for wheezing.      Marland Kitchen amLODipine (NORVASC) 10 MG tablet Take 5 mg by mouth daily.        Marland Kitchen aspirin 81 MG tablet Take 81 mg by mouth daily.        . butalbital-acetaminophen-caffeine (FIORICET, ESGIC) 50-325-40 MG per tablet Take 1 tablet by mouth as needed for headache.      . Cholecalciferol (VITAMIN D-3) 1000 UNITS CAPS Take by mouth daily.      . citalopram (CELEXA) 40 MG tablet Take 20 mg by mouth daily. Take half daily      . gabapentin (NEURONTIN) 400 MG capsule Take 400 mg by mouth 3 (three) times daily as needed.      Marland Kitchen glimepiride (AMARYL) 1 MG tablet Take 1 mg by mouth once.      . Ipratropium-Albuterol (DUONEB IN) Inhale into the lungs 4 (four) times daily.        Marland Kitchen lisinopril (PRINIVIL,ZESTRIL) 20 MG tablet TAKE ONE TABLET ONCE DAILY  30 tablet  3  . metFORMIN (GLUCOPHAGE) 1000 MG tablet Take 1,000 mg by mouth 2 (two) times daily.      . metoCLOPramide (REGLAN) 5 MG tablet Take 5 mg by mouth as needed.        . nitroGLYCERIN (NITROSTAT) 0.4 MG SL tablet Place 0.4 mg under the tongue every 5 (five) minutes as needed. May repeat for up to 3 doses.       . NON FORMULARY CPAP qhs       . pregabalin (LYRICA) 75 MG capsule Take 75 mg by mouth 3 (three) times daily.       . ranitidine (ZANTAC) 150 MG  tablet Take 150 mg by mouth 2 (two) times daily.      . simvastatin (ZOCOR) 40 MG tablet TAKE HALF TABLET BY MOUTH AT BEDTIME      . tiZANidine (ZANAFLEX) 4 MG tablet Take 4 mg by mouth 2 (two) times daily.      . vitamin B-12 (CYANOCOBALAMIN) 1000 MCG tablet Take 1,000 mcg by mouth 2 (two) times daily.        . Vitamin D, Ergocalciferol, (DRISDOL) 50000 UNITS CAPS Take 50,000 Units by mouth once a week.        . VOLTAREN 1 % GEL Apply 2 g topically 4 (four) times daily as needed.        No current facility-administered medications for this visit.    Past Medical History  Diagnosis Date  . Atrial flutter     Has preferred ASA to coumadin  . Diabetes mellitus type II   . Essential hypertension, benign   . Chronic pain     Followed at [facility]  . Hyperlipidemia   . Nonischemic cardiomyopathy     Cardiolite 12/09 LVEF 55%, minor luminal irregularities at cardiac catheterization  . Aortic root dilatation  Mild  . AR (aortic regurgitation)     Mild  . Diverticulitis     Social History Mr. Murfin reports that he has quit smoking. His smoking use included Cigarettes. He has a 15 pack-year smoking history. His smokeless tobacco use includes Chew. Mr. Binks reports that he drinks alcohol.  Review of Systems Intermittent anxiety and panic attacks. Chronic back and leg pain, also left shoulder pain. Other systems reviewed and negative except as outlined.  Physical Examination Filed Vitals:   03/09/14 1428  BP: 110/60  Pulse: 97   Filed Weights   03/09/14 1428  Weight: 237 lb (107.502 kg)    No acute distress.  HEENT: Conjunctiva and lids normal, oropharynx with poor dentition.  Neck: Supple, no elevated JVP or carotid bruits, no thyromegaly.  Lungs: Diminished breath sounds throughout, nonlabored, no wheezing.  Cardiac: Irregularly irregular rate and rhythm, no S3 gallop or rub.  Abdomen: Obese, nontender, bowel sounds present.  Skin: Warm and dry, scattered tattoos.    Extremities: No pitting edema, distal pulses one plus.    Problem List and Plan   Atrial flutter Today's ECG shows atrial fibrillation with controlled ventricular response. He continues to prefer aspirin and anticoagulants, heart rate remains controlled without specific medical therapy.  Essential hypertension, benign Blood pressure is normal today.  TOBACCO ABUSE Patient quit smoking one month ago.    [name], M.D., F.A.C.C.

## 2014-03-09 NOTE — Assessment & Plan Note (Signed)
Blood pressure is normal today. 

## 2014-03-09 NOTE — Assessment & Plan Note (Signed)
Today's ECG shows atrial fibrillation with controlled ventricular response. He continues to prefer aspirin and anticoagulants, heart rate remains controlled without specific medical therapy.

## 2014-03-09 NOTE — Assessment & Plan Note (Signed)
Patient quit smoking one month ago.

## 2014-04-09 ENCOUNTER — Other Ambulatory Visit: Payer: Self-pay | Admitting: Cardiology

## 2014-09-07 ENCOUNTER — Ambulatory Visit (INDEPENDENT_AMBULATORY_CARE_PROVIDER_SITE_OTHER): Payer: Medicare HMO | Admitting: Cardiology

## 2014-09-07 ENCOUNTER — Encounter: Payer: Self-pay | Admitting: Cardiology

## 2014-09-07 VITALS — BP 102/58 | HR 65 | Ht 69.0 in | Wt 238.0 lb

## 2014-09-07 DIAGNOSIS — I10 Essential (primary) hypertension: Secondary | ICD-10-CM

## 2014-09-07 DIAGNOSIS — I482 Chronic atrial fibrillation, unspecified: Secondary | ICD-10-CM

## 2014-09-07 DIAGNOSIS — I429 Cardiomyopathy, unspecified: Secondary | ICD-10-CM

## 2014-09-07 NOTE — Patient Instructions (Signed)

## 2014-09-07 NOTE — Progress Notes (Signed)
Cardiology Office Note  Date: 09/07/2014   ID: Dylan, Tyler 12-Aug-1945, MRN [id number]  PCP: [name], MD  Primary Cardiologist: Rozann Lesches, MD   Chief Complaint  Patient presents with  . Cardiomyopathy  . Atrial Flutter    History of Present Illness: Dylan Tyler is a 69 y.o. male last seen in September 2015. He presents for a routine visit today. States that he continues to have trouble with anxiety and panic attacks, also chronic pain. He follows with Dr. [name]. From a cardiac perspective, he denies any sense of palpitations, remains in atrial fibrillation/flutter that is rate controlled without specific medical intervention. As noted previously, he has preferred aspirin to anticoagulants over time.  He reports stable NYHA class II dyspnea, uses a cane to walk. Denies any recent falls.   Past Medical History  Diagnosis Date  . Atrial flutter     Has preferred ASA to coumadin  . Diabetes mellitus type II   . Essential hypertension, benign   . Chronic pain     Followed at [facility]  . Hyperlipidemia   . Nonischemic cardiomyopathy     Cardiolite 12/09 LVEF 55%, minor luminal irregularities at cardiac catheterization  . Aortic root dilatation     Mild  . AR (aortic regurgitation)     Mild  . Diverticulitis      Current Outpatient Prescriptions  Medication Sig Dispense Refill  . albuterol (PROVENTIL) (2.5 MG/3ML) 0.083% nebulizer solution Take 2.5 mg by nebulization every 6 (six) hours as needed for wheezing.    Marland Kitchen amLODipine (NORVASC) 10 MG tablet Take 5 mg by mouth daily.      Marland Kitchen aspirin 81 MG tablet Take 81 mg by mouth daily.      . butalbital-acetaminophen-caffeine (FIORICET, ESGIC) 50-325-40 MG per tablet Take 1 tablet by mouth as needed for headache.    . Cholecalciferol (VITAMIN D-3) 1000 UNITS CAPS Take by mouth daily.    . citalopram (CELEXA) 40 MG tablet Take 20 mg by mouth daily. Take half daily    . gabapentin (NEURONTIN) 400 MG capsule Take 400  mg by mouth 3 (three) times daily as needed.    Marland Kitchen glimepiride (AMARYL) 1 MG tablet Take 1 mg by mouth once.    . Ipratropium-Albuterol (DUONEB IN) Inhale into the lungs 4 (four) times daily.      Marland Kitchen lisinopril (PRINIVIL,ZESTRIL) 20 MG tablet TAKE ONE TABLET ONCE DAILY 30 tablet 3  . metFORMIN (GLUCOPHAGE) 1000 MG tablet Take 1,000 mg by mouth 2 (two) times daily.    . metoCLOPramide (REGLAN) 5 MG tablet Take 5 mg by mouth as needed.      . nitroGLYCERIN (NITROSTAT) 0.4 MG SL tablet Place 0.4 mg under the tongue every 5 (five) minutes as needed. May repeat for up to 3 doses.     . NON FORMULARY CPAP qhs     . pregabalin (LYRICA) 75 MG capsule Take 75 mg by mouth 3 (three) times daily.     . ranitidine (ZANTAC) 150 MG tablet Take 150 mg by mouth 2 (two) times daily.    . simvastatin (ZOCOR) 40 MG tablet TAKE HALF TABLET BY MOUTH AT BEDTIME    . tiZANidine (ZANAFLEX) 4 MG tablet Take 4 mg by mouth 2 (two) times daily.    . vitamin B-12 (CYANOCOBALAMIN) 1000 MCG tablet Take 1,000 mcg by mouth 2 (two) times daily.      . Vitamin D, Ergocalciferol, (DRISDOL) 50000 UNITS CAPS Take 50,000 Units  by mouth once a week.      . VOLTAREN 1 % GEL Apply 2 g topically 4 (four) times daily as needed.      No current facility-administered medications for this visit.    Allergies:  Review of patient's allergies indicates no known allergies.   Social History: The patient  reports that he has quit smoking. His smoking use included Cigarettes. He has a 15 pack-year smoking history. His smokeless tobacco use includes Chew. He reports that he drinks alcohol. He reports that he uses illicit drugs (Cocaine).   ROS:  Please see the history of present illness. Otherwise, complete review of systems is positive for none.  All other systems are reviewed and negative.    Physical Exam: VS:  BP 102/58 mmHg  Pulse 65  Ht 5\' 9"  (1.753 m)  Wt 238 lb (107.956 kg)  BMI 35.13 kg/m2  SpO2 97%, BMI Body mass index is 35.13  kg/(m^2).  Wt Readings from Last 3 Encounters:  09/07/14 238 lb (107.956 kg)  03/09/14 237 lb (107.502 kg)  08/31/12 231 lb (104.781 kg)     No acute distress.  HEENT: Conjunctiva and lids normal, oropharynx with poor dentition.  Neck: Supple, no elevated JVP or carotid bruits, no thyromegaly.  Lungs: Diminished breath sounds throughout, nonlabored, no wheezing.  Cardiac: Irregularly irregular rate and rhythm, no S3 gallop or rub.  Abdomen: Obese, nontender, bowel sounds present.  Skin: Warm and dry, scattered tattoos.  Extremities: No pitting edema, distal pulses one plus.     ECG: ECG is not ordered today.   Other Studies Reviewed Today:  Echocardiogram 12/30/2009 at [facility] showed mild LVH with LVEF [id number], grade 2 diastolic dysfunction, mild left atrial enlargement, trace mitral regurgitation, mild aortic regurgitation, trace tricuspid regurgitation.  Assessment and Plan:  1. Chronic atrial fibrillation/flutter. Heart rate controlled without specific medical therapy. He continues on aspirin, has declined anticoagulation over time.  2. History of nonischemic cardiomyopathy with improvement in LV function to low normal range based on prior testing. No active heart failure symptoms. Continue blood pressure control measures.  3. Essential hypertension, blood pressure is normal today.  Current medicines are reviewed at length with the patient today.  The patient does not have concerns regarding medicines.  Disposition: FU with me in 6 months.   Signed, [name], MD, [facility] 09/07/2014 2:38 PM    [place] [facility] [place], [place], [place] [address] Phone: [phone]; Fax: [phone]

## 2014-12-25 ENCOUNTER — Other Ambulatory Visit: Payer: Self-pay | Admitting: Cardiology

## 2015-03-07 ENCOUNTER — Ambulatory Visit: Payer: Medicare HMO | Admitting: Cardiology

## 2015-03-26 ENCOUNTER — Encounter (INDEPENDENT_AMBULATORY_CARE_PROVIDER_SITE_OTHER): Payer: Medicare HMO

## 2015-03-26 ENCOUNTER — Encounter: Payer: Self-pay | Admitting: Cardiology

## 2015-03-26 ENCOUNTER — Ambulatory Visit (INDEPENDENT_AMBULATORY_CARE_PROVIDER_SITE_OTHER): Payer: Medicare HMO | Admitting: Cardiology

## 2015-03-26 VITALS — BP 83/49 | HR 40 | Ht 69.0 in | Wt 236.8 lb

## 2015-03-26 DIAGNOSIS — I4819 Other persistent atrial fibrillation: Secondary | ICD-10-CM

## 2015-03-26 DIAGNOSIS — I4892 Unspecified atrial flutter: Secondary | ICD-10-CM

## 2015-03-26 DIAGNOSIS — Z8679 Personal history of other diseases of the circulatory system: Secondary | ICD-10-CM | POA: Diagnosis not present

## 2015-03-26 DIAGNOSIS — I959 Hypotension, unspecified: Secondary | ICD-10-CM | POA: Diagnosis not present

## 2015-03-26 DIAGNOSIS — I481 Persistent atrial fibrillation: Secondary | ICD-10-CM

## 2015-03-26 DIAGNOSIS — R001 Bradycardia, unspecified: Secondary | ICD-10-CM | POA: Diagnosis not present

## 2015-03-26 MED ORDER — LISINOPRIL 10 MG PO TABS: 10.0000 mg | ORAL_TABLET | Freq: Every day | ORAL | Status: DC

## 2015-03-26 NOTE — Progress Notes (Signed)
Cardiology Office Note  Date: 03/26/2015   ID: Dylan, Tyler [birth year], MRN [id number]  PCP: Dylan Savage, MD  Primary Cardiologist: Dylan Lesches, MD   Chief Complaint  Patient presents with  . Atrial Flutter  . History of cardiomyopathy    History of Present Illness: Dylan Tyler is a 69 y.o. male last seen in March. He presents with his wife today for a follow-up visit. In the last few months he has had more fatigue, his wife states that he has been more sleepy lately. They have noticed a decrease in his blood pressure and heart rate. He has not had any syncope. Feels dizzy when he stands up quickly.  I reviewed his medications. He continues on Norvasc and lisinopril. ECG today shows slow atrial fibrillation with junctional escape beats, heart rate 40, nonspecific ST-T changes.  He has a history of atrial flutter and over time has preferred aspirin to anticoagulants. Also prior history of nonischemic cardiomyopathy, LVEF improved to 50-55% range as of 2011. Cardiac catheterization in 2013 revealed minor coronary luminal irregularities with LVEF 60%.   Past Medical History  Diagnosis Date  . Atrial flutter     Has preferred ASA to coumadin  . Diabetes mellitus type II   . Essential hypertension, benign   . Chronic pain     Followed at [facility]  . Hyperlipidemia   . Nonischemic cardiomyopathy     Cardiolite 12/09 LVEF 55%, minor luminal irregularities at cardiac catheterization  . Aortic root dilatation     Mild  . AR (aortic regurgitation)     Mild  . Diverticulitis     Past Surgical History  Procedure Laterality Date  . Knee arthroscopy      Right  . Laparoscopic cholecystectomy    . Cardiac catheterization  08/13/11    Current Outpatient Prescriptions  Medication Sig Dispense Refill  . albuterol (PROVENTIL) (2.5 MG/3ML) 0.083% nebulizer solution Take 2.5 mg by nebulization every 6 (six) hours as needed for wheezing.    Marland Kitchen amLODipine (NORVASC) 10 MG tablet  Take 5 mg by mouth daily.      Marland Kitchen aspirin 81 MG tablet Take 81 mg by mouth daily.      . butalbital-acetaminophen-caffeine (FIORICET, ESGIC) 50-325-40 MG per tablet Take 1 tablet by mouth as needed for headache.    . Cholecalciferol (VITAMIN D-3) 1000 UNITS CAPS Take by mouth daily.    . citalopram (CELEXA) 40 MG tablet Take 20 mg by mouth daily. Take half daily    . gabapentin (NEURONTIN) 400 MG capsule Take 400 mg by mouth 3 (three) times daily as needed.    Marland Kitchen glimepiride (AMARYL) 1 MG tablet Take 1 mg by mouth once.    . Ipratropium-Albuterol (DUONEB IN) Inhale into the lungs 4 (four) times daily.      Marland Kitchen lisinopril (PRINIVIL,ZESTRIL) 20 MG tablet TAKE ONE TABLET ONCE DAILY 30 tablet 3  . metFORMIN (GLUCOPHAGE) 1000 MG tablet Take 1,000 mg by mouth 2 (two) times daily.    . metoCLOPramide (REGLAN) 5 MG tablet Take 5 mg by mouth as needed.      . naftifine (NAFTIN) 1 % cream APPLY TO THE AFFECTED AREA DAILY.  1  . nitroGLYCERIN (NITROSTAT) 0.4 MG SL tablet Place 0.4 mg under the tongue every 5 (five) minutes as needed. May repeat for up to 3 doses.     . NON FORMULARY CPAP qhs     . pregabalin (LYRICA) 75 MG capsule Take 75 mg  by mouth 3 (three) times daily.     . ranitidine (ZANTAC) 150 MG tablet Take 150 mg by mouth 2 (two) times daily.    . simvastatin (ZOCOR) 40 MG tablet TAKE ONE TABLET BY MOUTH AT BEDTIME 30 tablet 6  . terbinafine (LAMISIL) 250 MG tablet Take 250 mg by mouth daily.  0  . tiZANidine (ZANAFLEX) 4 MG tablet Take 4 mg by mouth 2 (two) times daily.    . vitamin B-12 (CYANOCOBALAMIN) 1000 MCG tablet Take 1,000 mcg by mouth 2 (two) times daily.      . Vitamin D, Ergocalciferol, (DRISDOL) 50000 UNITS CAPS Take 50,000 Units by mouth once a week.      . VOLTAREN 1 % GEL Apply 2 g topically 4 (four) times daily as needed.      No current facility-administered medications for this visit.    Allergies:  Review of patient's allergies indicates no known allergies.   Social  History: The patient  reports that he has quit smoking. His smoking use included Cigarettes. He has a 15 pack-year smoking history. His smokeless tobacco use includes Chew. He reports that he drinks alcohol. He reports that he uses illicit drugs (Cocaine).   ROS:  Please see the history of present illness. Otherwise, complete review of systems is positive for fatigue.  All other systems are reviewed and negative.   Physical Exam: VS:  BP 83/49 mmHg  Pulse 40  Ht 5\' 9"  (1.753 m)  Wt 236 lb 12.8 oz (107.412 kg)  BMI 34.95 kg/m2  SpO2 96%, BMI Body mass index is 34.95 kg/(m^2).  Wt Readings from Last 3 Encounters:  03/26/15 236 lb 12.8 oz (107.412 kg)  09/07/14 238 lb (107.956 kg)  03/09/14 237 lb (107.502 kg)     Pale, no distress.  HEENT: Conjunctiva and lids normal, oropharynx with poor dentition.  Neck: Supple, no elevated JVP or carotid bruits, no thyromegaly.  Lungs: Diminished breath sounds throughout, nonlabored, no wheezing.  Cardiac: Slow irregularly irregular rate and rhythm, no S3 gallop or rub.  Abdomen: Obese, nontender, bowel sounds present.  Skin: Warm and dry, scattered tattoos.  Extremities: 1-2+ edema, distal pulses one plus.    ECG: ECG is ordered today.   Assessment and Plan:  1. Symptomatic hypotension. I have asked that he stop Norvasc and reduce lisinopril to 10 mg daily. Check blood pressure daily to ensure that trend increases appropriately.  2. Symptomatic bradycardia with slow atrial fibrillation and conduction system disease. He is not on any rate lowering medications, would be unlikely that Norvasc is affecting AV conduction but this is being stopped anyway with low blood pressure. Obtain 24-hour Holter monitor to better assess heart rate variability. Discussed the possibility that he may need a pacemaker if this does not improve. I explained that he should go to the ER if he has syncope or worsening symptoms.  3. History of nonischemic  cardiopathy, minimal coronary luminal irregularities as of 2013, and normal LVEF at that time. He does have some leg edema. Will obtain a follow-up echocardiogram once we have better control of blood pressure and heart rate.  Current medicines were reviewed with the patient today.   Orders Placed This Encounter  Procedures  . EKG 12-Lead    Disposition: FU with me in 2 weeks.   Signed, [name], MD, [facility] 03/26/2015 3:44 PM    [facility] [place], [place], [place] [address] Phone: [phone]; Fax: [phone]

## 2015-03-26 NOTE — Patient Instructions (Signed)
Your physician has recommended you make the following change in your medication:  Stop amlodipine (norvasc) Decrease lisinopril to 10 mg daily. You may break your 20 mg tablet in half daily until they are finished. Continue all other medications the same. Your physician has recommended that you wear a 24 hour holter monitor. Holter monitors are medical devices that record the heart's electrical activity. Doctors most often use these monitors to diagnose arrhythmias. Arrhythmias are problems with the speed or rhythm of the heartbeat. The monitor is a small, portable device. You can wear one while you do your normal daily activities. This is usually used to diagnose what is causing palpitations/syncope (passing out). Your physician recommends that you schedule a follow-up appointment in: 2 weeks.

## 2015-04-01 ENCOUNTER — Telehealth: Payer: Self-pay | Admitting: *Deleted

## 2015-04-01 DIAGNOSIS — R001 Bradycardia, unspecified: Secondary | ICD-10-CM

## 2015-04-01 DIAGNOSIS — I359 Nonrheumatic aortic valve disorder, unspecified: Secondary | ICD-10-CM

## 2015-04-01 DIAGNOSIS — I4892 Unspecified atrial flutter: Secondary | ICD-10-CM

## 2015-04-01 NOTE — Telephone Encounter (Signed)
Patient informed and verbalized understanding of plan. 

## 2015-04-01 NOTE — Telephone Encounter (Signed)
-----   Message from [name], MD sent at 04/01/2015 10:00 AM EDT ----- Please see full report. Results are consistent with conduction system disease, likely that symptomatic bradycardia is playing a role in his fatigue. Arrange an echocardiogram to reassess LVEF as discussed in my recent note, and go ahead and make a referral to EP ASAP for review and discussion about possibility of pacemaker.

## 2015-04-01 NOTE — Telephone Encounter (Signed)
Spoke with the patient and he did not want to schedule Echo at this time.  Has to get with his wife and figure out transportation to get here.  Will call us back when he has worked out.  Also, updated referral and forwarded to [facility] for EP asap request

## 2015-04-04 ENCOUNTER — Ambulatory Visit (INDEPENDENT_AMBULATORY_CARE_PROVIDER_SITE_OTHER): Payer: Medicare HMO

## 2015-04-04 ENCOUNTER — Other Ambulatory Visit: Payer: Self-pay

## 2015-04-04 DIAGNOSIS — R001 Bradycardia, unspecified: Secondary | ICD-10-CM

## 2015-04-04 DIAGNOSIS — I359 Nonrheumatic aortic valve disorder, unspecified: Secondary | ICD-10-CM | POA: Diagnosis not present

## 2015-04-04 DIAGNOSIS — I4892 Unspecified atrial flutter: Secondary | ICD-10-CM

## 2015-04-05 ENCOUNTER — Telehealth: Payer: Self-pay | Admitting: *Deleted

## 2015-04-05 NOTE — Telephone Encounter (Signed)
-----   Message from [name], MD sent at 04/04/2015  6:36 PM EDT ----- Reviewed. LVEF is stable compared to prior assessment.

## 2015-04-05 NOTE — Telephone Encounter (Signed)
Patient informed. 

## 2015-04-10 ENCOUNTER — Ambulatory Visit: Payer: Medicare HMO | Admitting: Cardiology

## 2015-04-10 ENCOUNTER — Telehealth: Payer: Self-pay | Admitting: *Deleted

## 2015-04-10 NOTE — Telephone Encounter (Signed)
called cell (which is hm # as well) & did not get an answer, will need fm hx & status**

## 2015-04-12 ENCOUNTER — Encounter: Payer: Self-pay | Admitting: *Deleted

## 2015-04-12 ENCOUNTER — Encounter: Payer: Self-pay | Admitting: Cardiology

## 2015-04-12 ENCOUNTER — Ambulatory Visit (INDEPENDENT_AMBULATORY_CARE_PROVIDER_SITE_OTHER): Payer: Medicare HMO | Admitting: Cardiology

## 2015-04-12 VITALS — BP 86/48 | HR 53 | Ht 69.0 in | Wt 232.0 lb

## 2015-04-12 DIAGNOSIS — Z79899 Other long term (current) drug therapy: Secondary | ICD-10-CM | POA: Diagnosis not present

## 2015-04-12 DIAGNOSIS — I4892 Unspecified atrial flutter: Secondary | ICD-10-CM

## 2015-04-12 LAB — BASIC METABOLIC PANEL
BUN: 17 mg/dL (ref 7–25)
CALCIUM: 9.2 mg/dL (ref 8.6–10.3)
CHLORIDE: 104 mmol/L (ref 98–110)
CO2: 24 mmol/L (ref 20–31)
CREATININE: 1.29 mg/dL — AB (ref 0.70–1.25)
GLUCOSE: 142 mg/dL — AB (ref 65–99)
Potassium: 4.5 mmol/L (ref 3.5–5.3)
Sodium: 139 mmol/L (ref 135–146)

## 2015-04-12 NOTE — Progress Notes (Signed)
Electrophysiology Office Note   Date:  04/12/2015   ID:  Dylan, Tyler 28-Aug-1945, MRN [id number]  PCP:  [name], MD  Cardiologist:  Rozann Lesches Primary Electrophysiologist:Jayline Kilburg Meredith Leeds, MD    No chief complaint on file.    History of Present Illness: Dylan Tyler is a 69 y.o. male who presents today for electrophysiology evaluation.   He has a history of atrial flutter, DM2, HTN, and NICM with an EF that has improved to 55%.  He presented to the office with increased fatigue, now in atrial fibrillation with a junctional escape.  He has been diagnosed with atrial flutter and has preferred aspirin to anticoagulation.    Today, he says that he has profound fatigue.  He is sleeping all day and is unable to do his daily activites without issues.  He says that he has OSA but does not wear his CPAP.  This has been going on for the last few months.  He says that when he is in normal rhythm that he feels better and has more energy.  Past Medical History  Diagnosis Date  . Atrial flutter ([place])     Has preferred ASA to coumadin  . Diabetes mellitus type II   . Essential hypertension, benign   . Chronic pain     Followed at [facility]  . Hyperlipidemia   . Nonischemic cardiomyopathy ([place])     Cardiolite 12/09 LVEF 55%, minor luminal irregularities at cardiac catheterization  . Aortic root dilatation ([facility])     Mild  . AR (aortic regurgitation)     Mild  . Diverticulitis    Past Surgical History  Procedure Laterality Date  . Knee arthroscopy      Right  . Laparoscopic cholecystectomy    . Cardiac catheterization  08/13/11     Current Outpatient Prescriptions  Medication Sig Dispense Refill  . albuterol (PROVENTIL) (2.5 MG/3ML) 0.083% nebulizer solution Take 2.5 mg by nebulization every 6 (six) hours as needed for wheezing.    Marland Kitchen aspirin 81 MG tablet Take 81 mg by mouth daily.      . Cholecalciferol (VITAMIN D-3) 1000 UNITS CAPS Take 1 capsule by mouth daily.     .  citalopram (CELEXA) 40 MG tablet Take 20 mg by mouth daily. Take half daily    . gabapentin (NEURONTIN) 400 MG capsule Take 400 mg by mouth 3 (three) times daily as needed.    Marland Kitchen glimepiride (AMARYL) 2 MG tablet Take 2 mg by mouth 2 (two) times daily.  11  . Ipratropium-Albuterol (DUONEB IN) Inhale into the lungs 4 (four) times daily.      Marland Kitchen lisinopril (PRINIVIL,ZESTRIL) 20 MG tablet Take 20 mg by mouth daily. Take one half tablet daily  99  . metFORMIN (GLUCOPHAGE) 1000 MG tablet Take 1,000 mg by mouth 2 (two) times daily.    . metoCLOPramide (REGLAN) 5 MG tablet Take 5 mg by mouth as needed.      . naftifine (NAFTIN) 1 % cream APPLY TO THE AFFECTED AREA DAILY.  1  . naproxen sodium (ANAPROX) 220 MG tablet Take 220 mg by mouth 2 (two) times daily with a meal.    . nitroGLYCERIN (NITROSTAT) 0.4 MG SL tablet Place 0.4 mg under the tongue every 5 (five) minutes as needed. May repeat for up to 3 doses.     . NON FORMULARY CPAP qhs     . pregabalin (LYRICA) 75 MG capsule Take 75 mg by mouth 3 (three) times  daily.     . ranitidine (ZANTAC) 150 MG tablet Take 150 mg by mouth 2 (two) times daily.    . simvastatin (ZOCOR) 40 MG tablet TAKE ONE TABLET BY MOUTH AT BEDTIME 30 tablet 6  . terbinafine (LAMISIL) 250 MG tablet Take 250 mg by mouth daily.  0  . tiZANidine (ZANAFLEX) 4 MG tablet Take 4 mg by mouth 2 (two) times daily.    . vitamin B-12 (CYANOCOBALAMIN) 1000 MCG tablet Take 1,000 mcg by mouth 2 (two) times daily.      . Vitamin D, Ergocalciferol, (DRISDOL) 50000 UNITS CAPS Take 50,000 Units by mouth once a week.      . VOLTAREN 1 % GEL Apply 2 g topically 4 (four) times daily as needed.      No current facility-administered medications for this visit.    Allergies:   Review of patient's allergies indicates no known allergies.   Social History:  The patient  reports that he has quit smoking. His smoking use included Cigarettes. He has a 15 pack-year smoking history. His smokeless tobacco use  includes Chew. He reports that he drinks alcohol. He reports that he uses illicit drugs (Cocaine).   Family History:  The patient's family history includes Cancer in his father; Heart attack in his mother.    ROS:  Please see the history of present illness.   Otherwise, review of systems is positive for chest pain, PND, fatigue, leg pain, palpitations, snoring, DOE, anxiety, back and muscle pain, dizziness, headaches.   All other systems are reviewed and negative.    PHYSICAL EXAM: VS:  BP 86/48 mmHg  Pulse 53  Ht 5\' 9"  (1.753 m)  Wt 232 lb (105.235 kg)  BMI 34.24 kg/m2 , BMI Body mass index is 34.24 kg/(m^2). GEN: Well nourished, well developed, in no acute distress HEENT: normal Neck: no JVD, carotid bruits, or masses Cardiac: irregular; no murmurs, rubs, or gallops,no edema  Respiratory:  clear to auscultation bilaterally, normal work of breathing GI: soft, nontender, nondistended, + BS MS: no deformity or atrophy Skin: warm and dry Neuro:  Strength and sensation are intact Psych: euthymic mood, full affect  EKG:  EKG is ordered today. The ekg ordered today shows atrial fibrillation rate 53  Recent Labs: No results found for requested labs within last 365 days.    Lipid Panel  No results found for: CHOL, TRIG, HDL, CHOLHDL, VLDL, LDLCALC, LDLDIRECT   Wt Readings from Last 3 Encounters:  04/12/15 232 lb (105.235 kg)  03/26/15 236 lb 12.8 oz (107.412 kg)  09/07/14 238 lb (107.956 kg)      Other studies Reviewed: Additional studies/ records that were reviewed today include: TTE 04/04/15  Review of the above records today demonstrates:  - Left ventricle: The cavity size was normal. Wall thickness was increased increased in a pattern of mild to moderate LVH. Systolic function was normal. The estimated ejection fraction was in the range of 50% to 55%. Heart rhythm is coarse atrial fibrillation/flutter. Although no diagnostic regional wall motion abnormality  was identified, this possibility cannot be completely excluded on the basis of this study. The study is not technically sufficient to allow evaluation of LV diastolic function. - Aortic valve: Mildly calcified annulus. Trileaflet. There was mild regurgitation. - Aortic root: The aortic root was mildly dilated. - Mitral valve: There was trivial regurgitation. - Left atrium: The atrium was mildly to moderately dilated. - Right ventricle: The cavity size was normal. - Right atrium: The atrium was mildly  dilated. Central venous pressure (est): 3 mm Hg. - Atrial septum: There was a patent foramen ovale with left to right shunt by color Doppler. - Tricuspid valve: There was trivial regurgitation. - Pulmonary arteries: PA peak pressure: 23 mm Hg (S). - Pericardium, extracardiac: A trivial pericardial effusion was identified.  04/01/15 Holter 24-hour Holter monitor reviewed. Coarse atrial fibrillation present throughout. Average heart rate was 59 bpm with slowest heart rate around 29 bpm at 4:23 in the morning for a limited time. Longest R-R interval was 2.5 seconds, no longer pauses were noted. Peak heart rate around 110. Occasional to frequent PVCs also noted with lead motion artifact and also brief NSVT.  ASSESSMENT AND PLAN:  1.  Atrial fibrillation/flutter: CHADS2VA2Sc of 3.  He therefore requires anticoagulation.  Have discussed this with the patient including risks of being on aspirin for his AF.  Shanera Meske plan on cardioversion after 3 weeks of anticoagulation.  This patients CHA2DS2-VASc Score and unadjusted Ischemic Stroke Rate (% per year) is equal to 3.2 % stroke rate/year from a score of 3   Above score calculated as 1 point each if present [CHF, HTN, DM, Vascular=MI/PAD/Aortic Plaque, Age if 65-74, or Male] Above score calculated as 2 points each if present [Age > 75, or Stroke/TIA/TE]  2. Bradycardia: likely has conduction system disease with HR in the 20s.  This could  also be the cause of his fatigue.  Leianna Barga plan for pacemaker placement.      Current medicines are reviewed at length with the patient today.   The patient does not have concerns regarding his medicines.  The following changes were made today:  none  Labs/ tests ordered today include: eliquis, pacemaker placement  No orders of the defined types were placed in this encounter.     Disposition:   FU with post pacemaker with Tamatha Gadbois  Signed, Kyion Gautier Meredith Leeds, MD  04/12/2015 3:31 PM     [address] [place]  [address] [phone] (office) [phone] (fax)

## 2015-04-12 NOTE — Patient Instructions (Addendum)
Medication Instructions:  Your physician has recommended you make the following change in your medication: 1) START Eliquis  - we will call you about dosing once we get lab work back  Labwork: BMET today  Testing/Procedures: Your physician has recommended that you have a pacemaker inserted. A pacemaker is a small device that is placed under the skin of your chest or abdomen to help control abnormal heart rhythms. This device uses electrical pulses to prompt the heart to beat at a normal rate. Pacemakers are used to treat heart rhythms that are too slow. Wire (leads) are attached to the pacemaker that goes into the chambers of you heart. This is done in the hospital and usually requires and overnight stay. Please see the instruction sheet given to you today for more information.  Follow-Up: Your wound check is scheduled for 06/05/15 at 3:00 p.m. at [address]   Any Other Special Instructions Will Be Listed Below (If Applicable). Can hold blood pressure med at this time.  Please monitor on a daily basis.  Resume medication (Lisinopril) if blood pressure is 120 or above.  Thank you for choosing [facility]!!   Trinidad Curet, RN [phone]

## 2015-04-15 ENCOUNTER — Telehealth: Payer: Self-pay | Admitting: Cardiovascular Disease

## 2015-04-15 ENCOUNTER — Telehealth: Payer: Self-pay | Admitting: *Deleted

## 2015-04-15 MED ORDER — APIXABAN 5 MG PO TABS: 5.0000 mg | ORAL_TABLET | Freq: Two times a day (BID) | ORAL | Status: DC

## 2015-04-15 NOTE — Telephone Encounter (Signed)
Advised patient to start Eliquis 5 mg twice daily. Rx sent to [facility].  Patient also tells me that his case worker should be contacting us about rx for a digital blood pressure monitor.  Asked him to have her call me and I can fax prescription to her.  Patient verbalized understanding and agreeable to above plans.

## 2015-04-15 NOTE — Telephone Encounter (Signed)
New Message  Kennyth Lose  Personal health coach with [facility]    Please give her a call back

## 2015-04-16 NOTE — Addendum Note (Signed)
Addended by: Freada Bergeron on: 04/16/2015 02:00 PM   Modules accepted: Orders

## 2015-04-16 NOTE — Telephone Encounter (Signed)
Patient is asking for help from physician to obtain a BP cuff, for home, at no cost out of pocket secondary to low income. She gives me authorization number line ( # [id number]) to call to discuss with insurance the need for home BP cuff.  She tells me they will need a diagnosis code and necessity reason .  Patient can be given handwritten rx to take to DME supplier/pharmacy to obtain cuff once approved through insurance.  I will call them this week to discuss need for patient and attempt approval.

## 2015-04-22 ENCOUNTER — Telehealth: Payer: Self-pay | Admitting: Cardiology

## 2015-04-22 NOTE — Telephone Encounter (Signed)
Robyn Haber (personal health coach w/ [employer] at home) and I conference called insurance to attempt to obtain approval for DME with no out-of-pocket cost to patient.   (refer to 10/10 telephone note) Gave authorization referral for home BP cuff.   They are requesting we fax OV and letter of necessity to: [phone].  Informed that I would get requested information out this week.

## 2015-04-22 NOTE — Telephone Encounter (Signed)
Called and spoke with patient's benefit's department, through his insurance.   After 30 minutes on the phone being put on hold and shuffled around, they finally tell me that patient needs to call member services to discuss his coverage with them.  They will discuss cost and if there is anything that can be done to help him obtain BP cuff without out of pocket cost to him.  Informed patient that he needs to call member services and gave him the phone number [phone]).  Advised him to call them to discuss what options there are or what can be done to help him get a home BP monitor. (If patient calls back with further needs about this.  FYI:  In-Network DME provider for patient is [employer] [phone]) Patient thanks me for helping with this.  Told him to call back if I can be of further assistance with this.

## 2015-04-22 NOTE — Telephone Encounter (Signed)
New Message     Nurse calling to speak to [facility] about authorization for digital BP monitor. Please call back and advise

## 2015-04-23 NOTE — Telephone Encounter (Signed)
Left voicemail on Dylan Tyler's confidential voicemail, rep at [facility]. Left message stating that I did see in Dylan Tyler's note that she planned to fax over Dylan Tyler and letter of necessity this week. Advised on message if more information needed to please call our office and let us know. Will route to Dylan Tyler to make her aware.

## 2015-04-23 NOTE — Telephone Encounter (Signed)
Follow up     [facility] calling back to speak with nurse - clinical notes  for the blood pressure monitors

## 2015-04-24 ENCOUNTER — Encounter: Payer: Self-pay | Admitting: *Deleted

## 2015-04-24 NOTE — Telephone Encounter (Signed)
Faxed letter of necessity and last OV to [phone].

## 2015-04-25 NOTE — Telephone Encounter (Signed)
Received fax from insurance denying request - stating not medically necessary.

## 2015-04-29 ENCOUNTER — Telehealth: Payer: Self-pay | Admitting: Cardiology

## 2015-04-29 NOTE — Telephone Encounter (Signed)
Forwarding to [facility].

## 2015-04-29 NOTE — Telephone Encounter (Signed)
Follow up      Returning a call to [facility]

## 2015-04-29 NOTE — Telephone Encounter (Signed)
New message    Katie calling from [facility]    The Director look over the paper work - patient did not meet criteria

## 2015-04-30 NOTE — Telephone Encounter (Signed)
Dylan Tyler (from [place]) asks if there is any other documentation we have that may help case denial be overturned through grievance & appeal.  Informed her I would look into this and get back with her.

## 2015-05-06 ENCOUNTER — Telehealth: Payer: Self-pay | Admitting: *Deleted

## 2015-05-06 NOTE — Telephone Encounter (Signed)
Instructed patient to hold Eliquis day before his PPM implant on 11/15.  Advised to take last dose on Sunday night, 11/13. Patient verbalized understanding and agreeable to plan.

## 2015-05-06 NOTE — Telephone Encounter (Signed)
Called patient and Kennyth Lose, with [facility].  Advised to contact PCP for further help with obtaining blood pressure cuff for home. Both are agreeable to checking with PCP for help.

## 2015-05-08 ENCOUNTER — Telehealth: Payer: Self-pay | Admitting: Cardiology

## 2015-05-08 NOTE — Telephone Encounter (Signed)
Kennyth Lose from [facility] to give her update on monitor.  Advised that Sherri not in office today but will forward message to her since she will be here tomorrow.  Kennyth Lose said it is not urgent. [phone] [id number]

## 2015-05-08 NOTE — Telephone Encounter (Signed)
New message     Talk to a nurse regarding a bp monitor cuff

## 2015-05-13 NOTE — Telephone Encounter (Addendum)
She just wanted to update me about status of BP monitor.  Was unable to get through insurance.  Patient will obtain one through other means.

## 2015-05-17 ENCOUNTER — Telehealth: Payer: Self-pay | Admitting: Cardiology

## 2015-05-17 NOTE — Telephone Encounter (Signed)
New problem   Pt need to know which medications he need to stop taking before his procedure and when he need to stop. Please advise

## 2015-05-17 NOTE — Telephone Encounter (Signed)
Returned call.  Reviewed letter of 04/12/15, procedure instructions, with patients wife.  She expressed understanding.

## 2015-05-20 MED ORDER — CEFAZOLIN SODIUM-DEXTROSE 2-3 GM-% IV SOLR
2.0000 g | INTRAVENOUS | Status: AC
  Administered 2015-05-21: 2 g via INTRAVENOUS

## 2015-05-20 MED ORDER — SODIUM CHLORIDE 0.9 % IR SOLN
80.0000 mg | Status: AC
  Administered 2015-05-21: 80 mg
  Filled 2015-05-20: qty 2

## 2015-05-21 ENCOUNTER — Encounter (HOSPITAL_COMMUNITY): Payer: Self-pay | Admitting: Cardiology

## 2015-05-21 ENCOUNTER — Ambulatory Visit (HOSPITAL_COMMUNITY)
Admission: RE | Admit: 2015-05-21 | Discharge: 2015-05-22 | Disposition: A | Discharge status: Home / Self Care | Payer: Medicare HMO | Source: Ambulatory Visit | Attending: Cardiology | Admitting: Cardiology

## 2015-05-21 ENCOUNTER — Encounter (HOSPITAL_COMMUNITY)
Admission: RE | Disposition: A | Discharge status: Home / Self Care | Payer: Medicare HMO | Source: Ambulatory Visit | Attending: Cardiology

## 2015-05-21 DIAGNOSIS — E785 Hyperlipidemia, unspecified: Secondary | ICD-10-CM | POA: Diagnosis not present

## 2015-05-21 DIAGNOSIS — I4892 Unspecified atrial flutter: Secondary | ICD-10-CM | POA: Insufficient documentation

## 2015-05-21 DIAGNOSIS — Z95818 Presence of other cardiac implants and grafts: Secondary | ICD-10-CM

## 2015-05-21 DIAGNOSIS — I459 Conduction disorder, unspecified: Secondary | ICD-10-CM | POA: Diagnosis present

## 2015-05-21 DIAGNOSIS — Z7901 Long term (current) use of anticoagulants: Secondary | ICD-10-CM | POA: Insufficient documentation

## 2015-05-21 DIAGNOSIS — I481 Persistent atrial fibrillation: Secondary | ICD-10-CM | POA: Diagnosis not present

## 2015-05-21 DIAGNOSIS — I429 Cardiomyopathy, unspecified: Secondary | ICD-10-CM | POA: Insufficient documentation

## 2015-05-21 DIAGNOSIS — I10 Essential (primary) hypertension: Secondary | ICD-10-CM | POA: Insufficient documentation

## 2015-05-21 DIAGNOSIS — R001 Bradycardia, unspecified: Secondary | ICD-10-CM

## 2015-05-21 DIAGNOSIS — E119 Type 2 diabetes mellitus without complications: Secondary | ICD-10-CM | POA: Insufficient documentation

## 2015-05-21 DIAGNOSIS — Z87891 Personal history of nicotine dependence: Secondary | ICD-10-CM | POA: Insufficient documentation

## 2015-05-21 HISTORY — DX: Calculus of kidney: N20.0

## 2015-05-21 HISTORY — DX: Other intervertebral disc degeneration, lumbosacral region: M51.37

## 2015-05-21 HISTORY — DX: Anxiety disorder, unspecified: F41.9

## 2015-05-21 HISTORY — PX: EP IMPLANTABLE DEVICE: SHX172B

## 2015-05-21 HISTORY — DX: Rheumatoid arthritis, unspecified: M06.9

## 2015-05-21 HISTORY — DX: Ankylosing spondylitis of unspecified sites in spine: M45.9

## 2015-05-21 HISTORY — DX: Other cervical disc degeneration, unspecified cervical region: M50.30

## 2015-05-21 HISTORY — DX: Other intervertebral disc degeneration, lumbosacral region without mention of lumbar back pain or lower extremity pain: M51.379

## 2015-05-21 HISTORY — DX: Obstructive sleep apnea (adult) (pediatric): G47.33

## 2015-05-21 HISTORY — DX: Migraine, unspecified, not intractable, without status migrainosus: G43.909

## 2015-05-21 HISTORY — DX: Dependence on other enabling machines and devices: Z99.89

## 2015-05-21 HISTORY — DX: Malignant neoplasm of bladder, unspecified: C67.9

## 2015-05-21 HISTORY — DX: Major depressive disorder, single episode, unspecified: F32.9

## 2015-05-21 HISTORY — DX: Gastro-esophageal reflux disease without esophagitis: K21.9

## 2015-05-21 HISTORY — DX: Other intervertebral disc degeneration, thoracolumbar region: M51.35

## 2015-05-21 HISTORY — DX: Depression, unspecified: F32.A

## 2015-05-21 LAB — SURGICAL PCR SCREEN
MRSA, PCR: NEGATIVE
STAPHYLOCOCCUS AUREUS: NEGATIVE

## 2015-05-21 LAB — GLUCOSE, CAPILLARY
GLUCOSE-CAPILLARY: 208 mg/dL — AB (ref 65–99)
GLUCOSE-CAPILLARY: 127 mg/dL — AB (ref 65–99)
Glucose-Capillary: 178 mg/dL — ABNORMAL HIGH (ref 65–99)
Glucose-Capillary: 122 mg/dL — ABNORMAL HIGH (ref 65–99)

## 2015-05-21 LAB — BASIC METABOLIC PANEL
ANION GAP: 12 (ref 5–15)
BUN: 13 mg/dL (ref 6–20)
CALCIUM: 9.6 mg/dL (ref 8.9–10.3)
CO2: 22 mmol/L (ref 22–32)
Chloride: 103 mmol/L (ref 101–111)
Creatinine, Ser: 1.13 mg/dL (ref 0.61–1.24)
GLUCOSE: 162 mg/dL — AB (ref 65–99)
Potassium: 4.2 mmol/L (ref 3.5–5.1)
Sodium: 137 mmol/L (ref 135–145)

## 2015-05-21 LAB — CBC
HCT: 38.7 % — ABNORMAL LOW (ref 39.0–52.0)
Hemoglobin: 13.1 g/dL (ref 13.0–17.0)
MCH: 26.4 pg (ref 26.0–34.0)
MCHC: 33.9 g/dL (ref 30.0–36.0)
MCV: 78 fL (ref 78.0–100.0)
PLATELETS: 205 10*3/uL (ref 150–400)
RBC: 4.96 MIL/uL (ref 4.22–5.81)
RDW: 15 % (ref 11.5–15.5)
WBC: 10.3 10*3/uL (ref 4.0–10.5)

## 2015-05-21 SURGERY — PACEMAKER IMPLANT
Anesthesia: LOCAL

## 2015-05-21 MED ORDER — MORPHINE SULFATE (PF) 2 MG/ML IV SOLN
2.0000 mg | INTRAVENOUS | Status: DC | PRN
  Administered 2015-05-21 – 2015-05-22 (×2): 2 mg via INTRAVENOUS
  Filled 2015-05-21: qty 1

## 2015-05-21 MED ORDER — GABAPENTIN 300 MG PO CAPS
400.0000 mg | ORAL_CAPSULE | Freq: Three times a day (TID) | ORAL | Status: DC
  Administered 2015-05-21 – 2015-05-22 (×3): 400 mg via ORAL
  Filled 2015-05-21 (×6): qty 1

## 2015-05-21 MED ORDER — CEFAZOLIN SODIUM-DEXTROSE 2-3 GM-% IV SOLR
INTRAVENOUS | Status: DC | PRN
  Administered 2015-05-21: 2 g via INTRAVENOUS

## 2015-05-21 MED ORDER — MIDAZOLAM HCL 5 MG/5ML IJ SOLN
INTRAMUSCULAR | Status: AC
  Filled 2015-05-21: qty 25

## 2015-05-21 MED ORDER — TIZANIDINE HCL 4 MG PO TABS
4.0000 mg | ORAL_TABLET | Freq: Two times a day (BID) | ORAL | Status: DC
  Administered 2015-05-21 – 2015-05-22 (×2): 4 mg via ORAL
  Filled 2015-05-21 (×4): qty 1

## 2015-05-21 MED ORDER — HYDRALAZINE HCL 20 MG/ML IJ SOLN
10.0000 mg | Freq: Four times a day (QID) | INTRAMUSCULAR | Status: DC | PRN
  Administered 2015-05-22: 04:00:00 10 mg via INTRAVENOUS
  Filled 2015-05-21: qty 1

## 2015-05-21 MED ORDER — ONDANSETRON HCL 4 MG/2ML IJ SOLN: 4.0000 mg | Freq: Four times a day (QID) | INTRAMUSCULAR | Status: DC | PRN

## 2015-05-21 MED ORDER — MORPHINE SULFATE (PF) 2 MG/ML IV SOLN
INTRAVENOUS | Status: AC
  Filled 2015-05-21: qty 1

## 2015-05-21 MED ORDER — CEFAZOLIN SODIUM-DEXTROSE 2-3 GM-% IV SOLR
INTRAVENOUS | Status: AC
  Filled 2015-05-21: qty 50

## 2015-05-21 MED ORDER — LIDOCAINE HCL (PF) 1 % IJ SOLN
INTRAMUSCULAR | Status: AC
  Filled 2015-05-21: qty 30

## 2015-05-21 MED ORDER — ALBUTEROL SULFATE (2.5 MG/3ML) 0.083% IN NEBU: 2.5000 mg | INHALATION_SOLUTION | Freq: Four times a day (QID) | RESPIRATORY_TRACT | Status: DC | PRN

## 2015-05-21 MED ORDER — FENTANYL CITRATE (PF) 100 MCG/2ML IJ SOLN
INTRAMUSCULAR | Status: AC
  Filled 2015-05-21: qty 4

## 2015-05-21 MED ORDER — NITROGLYCERIN 0.4 MG SL SUBL: 0.4000 mg | SUBLINGUAL_TABLET | SUBLINGUAL | Status: DC | PRN

## 2015-05-21 MED ORDER — GLIMEPIRIDE 4 MG PO TABS
2.0000 mg | ORAL_TABLET | Freq: Two times a day (BID) | ORAL | Status: DC
  Administered 2015-05-21 – 2015-05-22 (×2): 2 mg via ORAL
  Filled 2015-05-21 (×2): qty 1

## 2015-05-21 MED ORDER — IPRATROPIUM-ALBUTEROL 0.5-2.5 (3) MG/3ML IN SOLN: 3.0000 mL | RESPIRATORY_TRACT | Status: DC | PRN

## 2015-05-21 MED ORDER — METFORMIN HCL 500 MG PO TABS
1000.0000 mg | ORAL_TABLET | Freq: Two times a day (BID) | ORAL | Status: DC
  Administered 2015-05-21 – 2015-05-22 (×2): 1000 mg via ORAL
  Filled 2015-05-21 (×2): qty 2

## 2015-05-21 MED ORDER — CEFAZOLIN SODIUM-DEXTROSE 2-3 GM-% IV SOLR
2.0000 g | Freq: Four times a day (QID) | INTRAVENOUS | Status: AC
  Administered 2015-05-21 – 2015-05-22 (×3): 2 g via INTRAVENOUS
  Filled 2015-05-21 (×3): qty 50

## 2015-05-21 MED ORDER — HEPARIN (PORCINE) IN NACL 2-0.9 UNIT/ML-% IJ SOLN
INTRAMUSCULAR | Status: AC
  Filled 2015-05-21: qty 500

## 2015-05-21 MED ORDER — VITAMIN D 1000 UNITS PO TABS
1000.0000 [IU] | ORAL_TABLET | Freq: Two times a day (BID) | ORAL | Status: DC
Start: 2015-05-21 — End: 2015-05-22
  Administered 2015-05-21 – 2015-05-22 (×3): 1000 [IU] via ORAL
  Filled 2015-05-21 (×3): qty 1

## 2015-05-21 MED ORDER — FENTANYL CITRATE (PF) 100 MCG/2ML IJ SOLN
INTRAMUSCULAR | Status: DC | PRN
  Administered 2015-05-21 (×4): 25 ug via INTRAVENOUS

## 2015-05-21 MED ORDER — LIDOCAINE HCL (PF) 1 % IJ SOLN
INTRAMUSCULAR | Status: DC | PRN
  Administered 2015-05-21: 14:00:00

## 2015-05-21 MED ORDER — ACETAMINOPHEN 325 MG PO TABS
325.0000 mg | ORAL_TABLET | ORAL | Status: DC | PRN
  Administered 2015-05-21 – 2015-05-22 (×2): 650 mg via ORAL
  Filled 2015-05-21 (×2): qty 2

## 2015-05-21 MED ORDER — MUPIROCIN 2 % EX OINT
TOPICAL_OINTMENT | CUTANEOUS | Status: AC
  Administered 2015-05-21: 1 via TOPICAL
  Filled 2015-05-21: qty 22

## 2015-05-21 MED ORDER — SODIUM CHLORIDE 0.9 % IV SOLN
INTRAVENOUS | Status: DC
  Administered 2015-05-21: 08:00:00 via INTRAVENOUS

## 2015-05-21 MED ORDER — SIMVASTATIN 20 MG PO TABS
40.0000 mg | ORAL_TABLET | Freq: Every day | ORAL | Status: DC
  Administered 2015-05-21: 22:00:00 40 mg via ORAL
  Filled 2015-05-21: qty 2

## 2015-05-21 MED ORDER — MIDAZOLAM HCL 5 MG/5ML IJ SOLN
INTRAMUSCULAR | Status: DC | PRN
  Administered 2015-05-21 (×5): 1 mg via INTRAVENOUS

## 2015-05-21 MED ORDER — CITALOPRAM HYDROBROMIDE 20 MG PO TABS
20.0000 mg | ORAL_TABLET | Freq: Two times a day (BID) | ORAL | Status: DC
  Administered 2015-05-21 – 2015-05-22 (×3): 20 mg via ORAL
  Filled 2015-05-21 (×3): qty 1

## 2015-05-21 MED ORDER — GENTAMICIN SULFATE 40 MG/ML IJ SOLN
INTRAMUSCULAR | Status: AC
  Filled 2015-05-21: qty 2

## 2015-05-21 MED ORDER — CARVEDILOL 3.125 MG PO TABS
6.2500 mg | ORAL_TABLET | Freq: Two times a day (BID) | ORAL | Status: DC
  Administered 2015-05-21 – 2015-05-22 (×2): 6.25 mg via ORAL
  Filled 2015-05-21 (×2): qty 2

## 2015-05-21 MED ORDER — MUPIROCIN 2 % EX OINT
1.0000 "application " | TOPICAL_OINTMENT | Freq: Once | CUTANEOUS | Status: AC
  Administered 2015-05-21: 1 via TOPICAL

## 2015-05-21 SURGICAL SUPPLY — 7 items
CABLE SURGICAL S-101-97-12 (CABLE) ×2 IMPLANT
LEAD CAPSURE NOVUS 5076-52CM (Lead) ×2 IMPLANT
LEAD CAPSURE NOVUS 5076-58CM (Lead) ×2 IMPLANT
PAD DEFIB LIFELINK (PAD) ×2 IMPLANT
PPM ADVISA MRI DR A2DR01 (Pacemaker) ×2 IMPLANT
SHEATH CLASSIC 7F (SHEATH) ×4 IMPLANT
TRAY PACEMAKER INSERTION (PACKS) ×2 IMPLANT

## 2015-05-21 NOTE — Discharge Instructions (Signed)
° ° °  Supplemental Discharge Instructions for  Pacemaker/Defibrillator Patients  Activity No heavy lifting or vigorous activity with your left/right arm for 6 to 8 weeks.  Do not raise your left/right arm above your head for one week.  Gradually raise your affected arm as drawn below.           __    05-26-15                 05-27-15                      05-28-15            05-29-15  NO DRIVING for 1 week    ; you may begin driving on  [id number]   .  WOUND CARE - Keep the wound area clean and dry.  Do not get this area wet for one week. No showers for one week; you may shower on 05-29-15    . - The tape/steri-strips on your wound will fall off; do not pull them off.  No bandage is needed on the site.  DO  NOT apply any creams, oils, or ointments to the wound area. - If you notice any drainage or discharge from the wound, any swelling or bruising at the site, or you develop a fever > 101? F after you are discharged home, call the office at once.  Special Instructions - You are still able to use cellular telephones; use the ear opposite the side where you have your pacemaker/defibrillator.  Avoid carrying your cellular phone near your device. - When traveling through airports, show security personnel your identification card to avoid being screened in the metal detectors.  Ask the security personnel to use the hand wand. - Avoid arc welding equipment, MRI testing (magnetic resonance imaging), TENS units (transcutaneous nerve stimulators).  Call the office for questions about other devices. - Avoid electrical appliances that are in poor condition or are not properly grounded. - Microwave ovens are safe to be near or to operate.

## 2015-05-21 NOTE — H&P (Signed)
EP H&P     Primary Care Physician: [name], MD Referring Physician:  Admit Date: 05/21/2015  Reason for consultation:  Dylan Tyler is a 69 y.o. male with a h/o Dylan Tyler is a 69 y.o. male who presents today for electrophysiology evaluation. He has a history of atrial flutter, DM2, HTN, and NICM with an EF that has improved to 55%. He presents to the office with increased fatigue, now in atrial fibrillation.   Today, he denies symptoms of palpitations, chest pain, shortness of breath, orthopnea, PND, lower extremity edema, dizziness, presyncope, syncope, or neurologic sequela. The patient is tolerating medications without difficulties and is otherwise without complaint today.   Past Medical History  Diagnosis Date  . Atrial flutter ([place])     Has preferred ASA to coumadin  . Diabetes mellitus type II   . Essential hypertension, benign   . Chronic pain     Followed at [facility]  . Hyperlipidemia   . Nonischemic cardiomyopathy ([place])     Cardiolite 12/09 LVEF 55%, minor luminal irregularities at cardiac catheterization  . Aortic root dilatation ([facility])     Mild  . AR (aortic regurgitation)     Mild  . Diverticulitis    Past Surgical History  Procedure Laterality Date  . Knee arthroscopy      Right  . Laparoscopic cholecystectomy    . Cardiac catheterization  08/13/11    .  ceFAZolin (ANCEF) IV  2 g Intravenous On Call  . gentamicin irrigation  80 mg Irrigation On Call   . sodium chloride 50 mL/hr at 05/21/15 0826    No Known Allergies  Social History   Social History  . Marital Status: Divorced    Spouse Name: N/A  . Number of Children: N/A  . Years of Education: N/A   Occupational History  . Disabled    Social History Main Topics  . Smoking status: Former Smoker -- 0.30 packs/day for 50 years    Types: Cigarettes  . Smokeless tobacco: Current User    Types: Chew     Comment: Now using vapor - e-cig  . Alcohol Use: 0.0 oz/week    0 Standard drinks  or equivalent per week  . Drug Use: Yes    Special: Cocaine     Comment: Former use of cocaine  . Sexual Activity: Not on file   Other Topics Concern  . Not on file   Social History Narrative   Divorced   No regular exercise    Family History  Problem Relation Age of Onset  . Heart attack Mother   . Cancer Father     ROS- All systems are reviewed and negative except as per the HPI above  Physical Exam: Telemetry: Filed Vitals:   05/21/15 0742  BP: 157/86  Pulse: 76  Temp: 97.9 F (36.6 C)  TempSrc: Oral  Resp: 18  Height: 5\' 9"  (1.753 m)  Weight: 232 lb (105.235 kg)  SpO2: 98%    GEN- The patient is well appearing, alert and oriented x 3 today.   Head- normocephalic, atraumatic Eyes-  Sclera clear, conjunctiva pink Ears- hearing intact Oropharynx- clear Neck- supple, no JVP Lymph- no cervical lymphadenopathy Lungs- Clear to ausculation bilaterally, normal work of breathing Heart- Regular rate and rhythm, no murmurs, rubs or gallops, PMI not laterally displaced GI- soft, NT, ND, + BS Extremities- no clubbing, cyanosis, or edema MS- no significant deformity or atrophy Skin- no rash or lesion Psych- euthymic mood, full affect  Neuro- strength and sensation are intact  EKG:  Labs:   Lab Results  Component Value Date   WBC 10.3 05/21/2015   HGB 13.1 05/21/2015   HCT 38.7* 05/21/2015   MCV 78.0 05/21/2015   PLT 205 05/21/2015    Recent Labs Lab 05/21/15 0848  NA 137  K 4.2  CL 103  CO2 22  BUN 13  CREATININE 1.13  CALCIUM 9.6  GLUCOSE 162*   No results found for: CKTOTAL, CKMB, CKMBINDEX, TROPONINI No results found for: CHOL No results found for: HDL No results found for: LDLCALC No results found for: TRIG No results found for: CHOLHDL No results found for: LDLDIRECT   TTE - Left ventricle: The cavity size was normal. Wall thickness was increased increased in a pattern of mild to moderate LVH. Systolic function was normal. The  estimated ejection fraction was in the range of 50% to 55%. Heart rhythm is coarse atrial fibrillation/flutter. Although no diagnostic regional wall motion abnormality was identified, this possibility cannot be completely excluded on the basis of this study. The study is not technically sufficient to allow evaluation of LV diastolic function. - Aortic valve: Mildly calcified annulus. Trileaflet. There was mild regurgitation. - Aortic root: The aortic root was mildly dilated. - Mitral valve: There was trivial regurgitation. - Left atrium: The atrium was mildly to moderately dilated. - Right ventricle: The cavity size was normal. - Right atrium: The atrium was mildly dilated. Central venous pressure (est): 3 mm Hg. - Atrial septum: There was a patent foramen ovale with left to right shunt by color Doppler. - Tricuspid valve: There was trivial regurgitation. - Pulmonary arteries: PA peak pressure: 23 mm Hg (S). - Pericardium, extracardiac: A trivial pericardial effusion was identified.  ASSESSMENT AND PLAN:   Patient with bradycardia and atrial fibrillation.  Plan for pacemaker today.  After he recovers from his procedure, will plan on drug load and cardioversion.  Explained risks and benefits of the procedure including bleeding, infection, tamponade, pneumothorax among others.  Patient understands risks and will proceed with ablation.   Will Dylan Leeds, MD 05/21/2015  10:11 AM

## 2015-05-21 NOTE — Progress Notes (Signed)
Patient placed on CPAP HS home setting 14. No O2 bleed in needed. Patient tolerating well. RT will continue to monitor as needed.

## 2015-05-22 ENCOUNTER — Other Ambulatory Visit: Payer: Self-pay

## 2015-05-22 ENCOUNTER — Encounter (HOSPITAL_COMMUNITY): Payer: Self-pay | Admitting: Physician Assistant

## 2015-05-22 ENCOUNTER — Ambulatory Visit (HOSPITAL_COMMUNITY): Payer: Medicare HMO

## 2015-05-22 DIAGNOSIS — E785 Hyperlipidemia, unspecified: Secondary | ICD-10-CM | POA: Diagnosis not present

## 2015-05-22 DIAGNOSIS — R001 Bradycardia, unspecified: Secondary | ICD-10-CM | POA: Diagnosis not present

## 2015-05-22 DIAGNOSIS — I10 Essential (primary) hypertension: Secondary | ICD-10-CM | POA: Diagnosis not present

## 2015-05-22 DIAGNOSIS — E119 Type 2 diabetes mellitus without complications: Secondary | ICD-10-CM | POA: Diagnosis not present

## 2015-05-22 LAB — GLUCOSE, CAPILLARY: Glucose-Capillary: 172 mg/dL — ABNORMAL HIGH (ref 65–99)

## 2015-05-22 MED ORDER — CARVEDILOL 6.25 MG PO TABS: 6.2500 mg | ORAL_TABLET | Freq: Two times a day (BID) | ORAL | Status: DC

## 2015-05-22 MED ORDER — HYDROCODONE-ACETAMINOPHEN 5-325 MG PO TABS: 1.0000 | ORAL_TABLET | Freq: Four times a day (QID) | ORAL | Status: DC | PRN

## 2015-05-22 MED ORDER — LISINOPRIL 20 MG PO TABS: 20.0000 mg | ORAL_TABLET | Freq: Every day | ORAL | Status: DC

## 2015-05-22 MED FILL — Lidocaine HCl Local Preservative Free (PF) Inj 1%: INTRAMUSCULAR | Qty: 30 | Status: AC

## 2015-05-22 NOTE — Care Management Note (Signed)
Case Management Note  Patient Details  Name: Dylan Tyler MRN: [id number] Date of Birth: [birth year]  Subjective/Objective:    Symptomatic bradycardia status post pacemaker implantation    Action/Plan: NCM spoke to pt and lives at home with SGO, Davina Poke # [phone]. Provided pt with Effient 30 day free trial card. Contacted pt pharmacy, [facility] [phone]. Pharmacy has medication in stock.   Expected Discharge Date:  05/22/2015               Expected Discharge Plan:  Home/Self Care  In-House Referral:     Discharge planning Services  CM Consult, Medication Assistance   Status of Service:  Completed, signed off  Medicare Important Message Given:    Date Medicare IM Given:    Medicare IM give by:    Date Additional Medicare IM Given:    Additional Medicare Important Message give by:     If discussed at [place] of Stay Meetings, dates discussed:    Additional Comments:  Erenest Rasher, RN 05/22/2015, 10:53 AM

## 2015-05-22 NOTE — Discharge Summary (Signed)
ELECTROPHYSIOLOGY PROCEDURE DISCHARGE SUMMARY    Patient ID: Dylan Tyler,  MRN: [id number], DOB/AGE: 08-21-1945 69 y.o.  Admit date: 05/21/2015 Discharge date: 05/22/2015  Primary Care Physician: [name], MD Primary Cardiologist: Dr. Domenic Polite Electrophysiologist: Dr. Curt Bears  Primary Discharge Diagnosis:  1. Symptomatic bradycardia status post pacemaker implantation this admission  Secondary Discharge Diagnosis:  1. Persistent Atrial fibrillation on Eliquis, CHADS2Vasc is at least 3 2. DM 3. NICM, improved EF 4. HTN  No Known Allergies   Procedures This Admission:  1.  Implantation of a MDT dual chamber PPM on 05/21/15 by Dr Curt Bears.  The patient received a Medtronic Advisa L model A2DR01 1 (serial number [id number]) pacemaker with model numberMedtronic model 5076 (serial number [id number]) right atrial lead and a Medtronic model 5076 (serial number [id number]) right ventricular lead.  There were no immediate post procedure complications. 2.  CXR on 05/22/15 demonstrated no pneumothorax status post device implantation.   Brief HPI: Dylan Tyler is a 69 y.o. male was referred to electrophysiology in the outpatient setting for consideration of PPM implantation.  Past medical history includes AFlutter, more recently atrial fibrillation with symptomatic bradycardia without reversible causes identified. HTN, DM, and NICM with an improved LVEF.   Risks, benefits, and alternatives to PPM implantation were reviewed with the patient who wished to proceed.   Hospital Course:  The patient was admitted and underwent implantation of a MDT PPM with details as outlined above.  He  was monitored on telemetry overnight which demonstrated atrial fibrillation with CVR with occasional V. pacing.  Left chest was without hematoma or ecchymosis.  The device was interrogated and found to be functioning normally.  CXR was obtained and demonstrated no pneumothorax status post device  implantation.  Wound care, arm mobility, and restrictions were reviewed with the patient.  His BP has been elevated, Coreg was started here, we Pearlena Ow also send him homewith lisinopril 20mg  daily.  The patient denies any CP, palpitationsor SOB, he does c/o moderate discomfort at the pacer site/shoulder, as per Dr. Curt Bears, we Tniyah Nakagawa give him Vicodin 5/325mg  q6 hours as needed #12 without  Refills. The patient was examined by Dr. Curt Bears and considered stable for discharge to home.    Physical Exam: Filed Vitals:   05/21/15 2235 05/22/15 0310 05/22/15 0405 05/22/15 0500  BP:  169/73 124/59 145/60  Pulse: 72 60    Temp:  97.6 F (36.4 C)    TempSrc:  Oral    Resp: 18 15    Height:      Weight:  227 lb 1.2 oz (103 kg)    SpO2: 96% 96%      GEN- The patient is well appearing, alert and oriented x 3 today.   HEENT: normocephalic, atraumatic; sclera clear, conjunctiva pink; hearing intact; oropharynx clear; neck supple, no JVP Lungs- Clear to ausculation bilaterally, normal work of breathing.  No wheezes, rales, rhonchi Heart- irregular rate and rhythm, no significant murmurs, no rubs or gallops, PMI not laterally displaced GI- soft, non-tender, non-distended, bowel sounds present, no hepatosplenomegaly Extremities- no clubbing, cyanosis, or edema MS- no significant deformity or atrophy Skin- warm and dry, no rash or lesion, left chest without hematoma/ecchymosis Psych- euthymic mood, full affect Neuro- no gross deficits   Labs:   Lab Results  Component Value Date   WBC 10.3 05/21/2015   HGB 13.1 05/21/2015   HCT 38.7* 05/21/2015   MCV 78.0 05/21/2015   PLT 205 05/21/2015  Recent Labs Lab 05/21/15 0848  NA 137  K 4.2  CL 103  CO2 22  BUN 13  CREATININE 1.13  CALCIUM 9.6  GLUCOSE 162*   05/22/15: CXR IMPRESSION: Interval pacemaker placement. No pneumothorax or evidence of acute airspace disease.  Discharge Medications:    Medication List    TAKE these medications         albuterol (2.5 MG/3ML) 0.083% nebulizer solution  Commonly known as:  PROVENTIL  Take 2.5 mg by nebulization every 6 (six) hours as needed for wheezing.     apixaban 5 MG Tabs tablet  Commonly known as:  ELIQUIS  Take 1 tablet (5 mg total) by mouth 2 (two) times daily.     carvedilol 6.25 MG tablet  Commonly known as:  COREG  Take 1 tablet (6.25 mg total) by mouth 2 (two) times daily with a meal.     citalopram 40 MG tablet  Commonly known as:  CELEXA  Take 20 mg by mouth 2 (two) times daily.     gabapentin 400 MG capsule  Commonly known as:  NEURONTIN  Take 400 mg by mouth 3 (three) times daily.     glimepiride 2 MG tablet  Commonly known as:  AMARYL  Take 2 mg by mouth 2 (two) times daily.     ibuprofen 200 MG tablet  Commonly known as:  ADVIL,MOTRIN  Take 400 mg by mouth every 6 (six) hours as needed for headache or moderate pain.     ipratropium-albuterol 0.5-2.5 (3) MG/3ML Soln  Commonly known as:  DUONEB  Take 3 mLs by nebulization every 4 (four) hours as needed (for shortness of breath).     lisinopril 20 MG tablet  Commonly known as:  PRINIVIL  Take 1 tablet (20 mg total) by mouth daily.     metFORMIN 1000 MG tablet  Commonly known as:  GLUCOPHAGE  Take 1,000 mg by mouth 2 (two) times daily.     metoCLOPramide 5 MG tablet  Commonly known as:  REGLAN  Take 5 mg by mouth every 6 (six) hours as needed for nausea or vomiting.     nitroGLYCERIN 0.4 MG SL tablet  Commonly known as:  NITROSTAT  Place 0.4 mg under the tongue every 5 (five) minutes as needed. May repeat for up to 3 doses.     NON FORMULARY  1 each by Other route See admin instructions. CPAP - Uses nightly     simvastatin 40 MG tablet  Commonly known as:  ZOCOR  TAKE ONE TABLET BY MOUTH AT BEDTIME     tiZANidine 4 MG tablet  Commonly known as:  ZANAFLEX  Take 4 mg by mouth 2 (two) times daily.     vitamin B-12 1000 MCG tablet  Commonly known as:  CYANOCOBALAMIN  Take 1,000 mcg by  mouth 2 (two) times daily.     Vitamin D-3 1000 UNITS Caps  Take 1,000 Units by mouth 2 (two) times daily.     VOLTAREN 1 % Gel  Generic drug:  diclofenac sodium  Apply 2 g topically 4 (four) times daily as needed (for pain).        Disposition: Home  Follow-up Information    [facility] with [facility] On 06/05/2015.   Specialty:  Cardiology   Why:  at [facility] for wound check   Contact information:   [address] [place] [phone]      Follow up with Lashonta Pilling  Meredith Leeds, MD On 06/25/2015.   Specialty:  Cardiology   Why:  at 2:15PM   Contact information:   [place] [address] [phone]       Duration of Discharge Encounter: Greater than 30 minutes including physician time.  Dylan Night, PA-C 05/22/2015 8:31 AM   I have seen and examined this patient with Dylan Tyler.  Agree with above, note added to reflect my findings.  On exam, irregular rhythm, no murmurs, lungs clear.    Intermittent pacing while in atrial fibrillation.  Pacemaker check this AM shows no acute abnormalities, CXR without pneumothorax.  Dylan Tyler discharge today to follow up in clinic.  Dylan Tyler plan on cardioversion in 4 weeks with possible drug load.  Camey Edell M. Shawan Corella MD 05/22/2015 8:30 PM

## 2015-05-27 ENCOUNTER — Telehealth: Payer: Self-pay | Admitting: Cardiology

## 2015-05-27 NOTE — Telephone Encounter (Signed)
Spoke w/ pt girlfriend and she informed me that her ipad was not compatible with the [employer] smart home monitor. She stated that her daughter had an ipad and it was compatible but they were unable to successful send a remote transmission and her daughter does not get home until 5 PM and she keeps her ipad with her at all times. Instructed pt girlfriend to call tech services to receive help troubleshooting the home monitor. She verbalized understanding. She also wanted to know if bruising under device site is normal? Informed her I would have a nurse call her back about this. She verbalized understanding.

## 2015-05-27 NOTE — Telephone Encounter (Signed)
New problem   Pt has device implanted and his girlfriend Mardene Celeste has questions concerning [employer]. Please call.

## 2015-05-28 NOTE — Telephone Encounter (Signed)
LMOVM requesting call back.

## 2015-06-03 NOTE — Telephone Encounter (Signed)
Dylan Tyler returned call and apologized that she had not called back earlier.  Explained to Dylan Tyler that I spoke with the patient earlier this morning.  She states she has no additional questions or concerns and confirmed wound check appointment date and time.  She is appreciative of call.

## 2015-06-03 NOTE — Telephone Encounter (Signed)
Called patient back.  Patient states that his bruising is "better" and he denies pain, redness, swelling, or other signs of infection.  He states that he has had two instances of sharp pain in his left chest that lasted "a second or two" over the weekend.  He states the "pains" resolved on their own.  Reviewed symptoms with Dr. [name], no additional recommendations at this time, brief pain likely not cardiac.  Patient aware to call our office or seek emergency services if symptoms worsen.  He is aware to call with questions or concerns and is appreciative of call.  Confirmed wound check appointment on 06/05/15 at 11:30am.

## 2015-06-05 ENCOUNTER — Ambulatory Visit (INDEPENDENT_AMBULATORY_CARE_PROVIDER_SITE_OTHER): Payer: Medicare HMO | Admitting: Cardiology

## 2015-06-05 ENCOUNTER — Encounter: Payer: Self-pay | Admitting: Cardiology

## 2015-06-05 DIAGNOSIS — I481 Persistent atrial fibrillation: Secondary | ICD-10-CM

## 2015-06-05 DIAGNOSIS — I4819 Other persistent atrial fibrillation: Secondary | ICD-10-CM

## 2015-06-05 NOTE — Progress Notes (Signed)
Electrophysiology Office Note   Date:  06/05/2015   ID:  Brox, Criado [birth year], MRN [id number]  PCP:  [name], MD  Primary Electrophysiologist:  Constance Haw, MD    No chief complaint on file.    History of Present Illness: Dylan Tyler is a 69 y.o. male who presents today for electrophysiology evaluation.    Presented today for pacemaker check. Has no evidence of issues with his pacemaker. Continues to be in atrial flutter atrial fibrillation. His fatigue is improved but still exists. He has been on Eliquis without interrupted doses since his device placement.   Today, he denies symptoms of palpitations, chest pain, shortness of breath, orthopnea, PND, lower extremity edema, claudication, dizziness, presyncope, syncope, bleeding, or neurologic sequela. The patient is tolerating medications without difficulties and is otherwise without complaint today.    Past Medical History  Diagnosis Date  . Atrial flutter ([place])     Has preferred ASA to coumadin  . Essential hypertension, benign   . Chronic pain     Followed at [facility]  . Hyperlipidemia   . Nonischemic cardiomyopathy ([place])     Cardiolite 12/09 LVEF 55%, minor luminal irregularities at cardiac catheterization  . Aortic root dilatation ([facility])     Mild  . AR (aortic regurgitation)     Mild  . Diverticulitis   . Presence of permanent cardiac pacemaker   . Kidney stones   . Heart murmur   . OSA on CPAP   . Diabetes mellitus type II   . GERD (gastroesophageal reflux disease)   . Daily headache     "@ times" (05/21/2015"  . Migraine     "at least once/week" (05/21/2015)  . Rheumatoid arthritis ([place])     "all thru my back" (05/21/2015)  . Degenerative cervical disc   . DDD (degenerative disc disease), lumbosacral   . DDD (degenerative disc disease), thoracolumbar   . Ankylosing spondylitis ([place])   . Chronic back pain greater than 3 months duration   . Anxiety   . Depression   . Bladder cancer  ([place])   . Bradycardia     MDT PPM implant 05/21/15 Dr. Curt Bears  . Atrial fibrillation ([place])     Chads2Vasc at least 3    Past Surgical History  Procedure Laterality Date  . Knee arthroscopy Right ~ 1991  . Cardiac catheterization  08/13/11  . Ep implantable device N/A 05/21/2015    Procedure: Pacemaker Implant;  Surgeon: Mackson Botz Meredith Leeds, MD;  Location: [facility];  Service: Cardiovascular;  Laterality: N/A;  . Laparoscopic cholecystectomy  ~ 2008  . Cystoscopy with holmium laser lithotripsy  1990's  . Transurethral resection of bladder tumor with gyrus (turbt-gyrus)  1990's?     Current Outpatient Prescriptions  Medication Sig Dispense Refill  . albuterol (PROVENTIL) (2.5 MG/3ML) 0.083% nebulizer solution Take 2.5 mg by nebulization every 6 (six) hours as needed for wheezing.    Marland Kitchen apixaban (ELIQUIS) 5 MG TABS tablet Take 1 tablet (5 mg total) by mouth 2 (two) times daily. 60 tablet 0  . carvedilol (COREG) 6.25 MG tablet Take 1 tablet (6.25 mg total) by mouth 2 (two) times daily with a meal. 60 tablet 3  . Cholecalciferol (VITAMIN D-3) 1000 UNITS CAPS Take 1,000 Units by mouth 2 (two) times daily.     . citalopram (CELEXA) 40 MG tablet Take 20 mg by mouth 2 (two) times daily.     Marland Kitchen gabapentin (NEURONTIN) 400 MG capsule Take  400 mg by mouth 3 (three) times daily.     Marland Kitchen glimepiride (AMARYL) 2 MG tablet Take 2 mg by mouth 2 (two) times daily.  11  . HYDROcodone-acetaminophen (NORCO) 5-325 MG tablet Take 1 tablet by mouth every 6 (six) hours as needed for moderate pain. 12 tablet 0  . ibuprofen (ADVIL,MOTRIN) 200 MG tablet Take 400 mg by mouth every 6 (six) hours as needed for headache or moderate pain.    Marland Kitchen ipratropium-albuterol (DUONEB) 0.5-2.5 (3) MG/3ML SOLN Take 3 mLs by nebulization every 4 (four) hours as needed (for shortness of breath).    Marland Kitchen lisinopril (PRINIVIL,ZESTRIL) 20 MG tablet Take 1 tablet (20 mg total) by mouth daily. 30 tablet 3  . metFORMIN (GLUCOPHAGE) 1000 MG  tablet Take 1,000 mg by mouth 2 (two) times daily.    . metoCLOPramide (REGLAN) 5 MG tablet Take 5 mg by mouth every 6 (six) hours as needed for nausea or vomiting.     . nitroGLYCERIN (NITROSTAT) 0.4 MG SL tablet Place 0.4 mg under the tongue every 5 (five) minutes as needed. May repeat for up to 3 doses.     . NON FORMULARY 1 each by Other route See admin instructions. CPAP - Uses nightly    . simvastatin (ZOCOR) 40 MG tablet TAKE ONE TABLET BY MOUTH AT BEDTIME (Patient taking differently: TAKE 40 MG BY MOUTH AT BEDTIME) 30 tablet 6  . tiZANidine (ZANAFLEX) 4 MG tablet Take 4 mg by mouth 2 (two) times daily.    . vitamin B-12 (CYANOCOBALAMIN) 1000 MCG tablet Take 1,000 mcg by mouth 2 (two) times daily.      . VOLTAREN 1 % GEL Apply 2 g topically 4 (four) times daily as needed (for pain).      No current facility-administered medications for this visit.    Allergies:   Review of patient's allergies indicates no known allergies.   Social History:  The patient  reports that he quit smoking about 17 months ago. His smoking use included Cigarettes. He has a 15 pack-year smoking history. He has quit using smokeless tobacco. His smokeless tobacco use included Chew. He reports that he drinks about 1.2 oz of alcohol per week. He reports that he uses illicit drugs (Cocaine).   Family History:  The patient's family history includes Cancer in his father; Heart attack in his mother.    ROS:  Please see the history of present illness.   All other systems are reviewed and negative.    PHYSICAL EXAM: VS:  There were no vitals taken for this visit. , BMI There is no weight on file to calculate BMI. GEN: Well nourished, well developed, in no acute distress HEENT: normal Neck: no JVD, carotid bruits, or masses Cardiac: irregular rhythm; no murmurs, rubs, or gallops,no edema  Respiratory:  clear to auscultation bilaterally, normal work of breathing GI: soft, nontender, nondistended, + BS MS: no deformity  or atrophy Skin: warm and dry Neuro:  Strength and sensation are intact Psych: euthymic mood, full affect  EKG:  EKG is not ordered today   Device interrogation is reviewed today in detail.  See PaceArt for details.   Recent Labs: 05/21/2015: BUN 13; Creatinine, Ser 1.13; Hemoglobin 13.1; Platelets 205; Potassium 4.2; Sodium 137    Lipid Panel  No results found for: CHOL, TRIG, HDL, CHOLHDL, VLDL, LDLCALC, LDLDIRECT   Wt Readings from Last 3 Encounters:  05/22/15 227 lb 1.2 oz (103 kg)  04/12/15 232 lb (105.235 kg)  03/26/15 236 lb  12.8 oz (107.412 kg)      ASSESSMENT AND PLAN:  1.  Bradycardia: s/p pacemaker, no issues on 10 day check.  Continue current management.  2. Atrial fibrillation:  Currently feeling well without any major complaints. Plan is for cardioversion 3 weeks after he restarted his Eliquis. We Osualdo Hansell plan on cardioversion in 2 weeks time  With continued Eliquis. Should he go back into atrial fibrillation or atrial flutter he may benefit from drug therapy or ablation.    Current medicines are reviewed at length with the patient today.   The patient does not have concerns regarding his medicines.  The following changes were made today:  none  Labs/ tests ordered today include:  No orders of the defined types were placed in this encounter.     Disposition:   FU with Winfred Redel post cardioversion  Signed, Madalaine Portier Meredith Leeds, MD  06/05/2015 1:50 PM     [address] [place] [address] [phone] (office) [phone] (fax)

## 2015-06-05 NOTE — Patient Instructions (Signed)
Medication Instructions:  Your physician recommends that you continue on your current medications as directed. Please refer to the Current Medication list given to you today.  Labwork: None ordered  Testing/Procedures: Your physician has recommended that you have a Cardioversion (DCCV). Electrical Cardioversion uses a jolt of electricity to your heart either through paddles or wired patches attached to your chest. This is a controlled, usually prescheduled, procedure. Defibrillation is done under light anesthesia in the hospital, and you usually go home the day of the procedure. This is done to get your heart back into a normal rhythm. You are not awake for the procedure. Please see the instruction sheet given to you today.  Follow-Up: To be determined after cardioversion is scheduled.  If you need a refill on your cardiac medications before your next appointment, please call your pharmacy.  Thank you for choosing [facility]!!   Trinidad Curet, RN [phone]

## 2015-06-07 LAB — CUP PACEART INCLINIC DEVICE CHECK
Brady Statistic AP VS Percent: 0 %
Brady Statistic RA Percent Paced: 0.02 %
Lead Channel Impedance Value: 437 Ohm
Lead Channel Pacing Threshold Pulse Width: 0.4 ms
Lead Channel Sensing Intrinsic Amplitude: 3.75 mV
Lead Channel Sensing Intrinsic Amplitude: 4 mV
Lead Channel Sensing Intrinsic Amplitude: 6.25 mV
Lead Channel Setting Pacing Amplitude: 3.5 V
Lead Channel Setting Sensing Sensitivity: 2.8 mV
MDC IDC MSMT BATTERY REMAINING LONGEVITY: 131 mo
MDC IDC MSMT BATTERY VOLTAGE: 3.11 V
MDC IDC MSMT LEADCHNL RA IMPEDANCE VALUE: 380 Ohm
MDC IDC MSMT LEADCHNL RV IMPEDANCE VALUE: 380 Ohm
MDC IDC MSMT LEADCHNL RV IMPEDANCE VALUE: 475 Ohm
MDC IDC MSMT LEADCHNL RV PACING THRESHOLD AMPLITUDE: 0.875 V
MDC IDC MSMT LEADCHNL RV SENSING INTR AMPL: 7.25 mV
MDC IDC SESS DTM: 20161130163851
MDC IDC SET LEADCHNL RA PACING AMPLITUDE: 3.5 V
MDC IDC SET LEADCHNL RV PACING PULSEWIDTH: 0.4 ms
MDC IDC STAT BRADY AP VP PERCENT: 0.02 %
MDC IDC STAT BRADY AS VP PERCENT: 66.44 %
MDC IDC STAT BRADY AS VS PERCENT: 33.55 %
MDC IDC STAT BRADY RV PERCENT PACED: 66.45 %

## 2015-06-13 ENCOUNTER — Telehealth: Payer: Self-pay | Admitting: *Deleted

## 2015-06-13 NOTE — Telephone Encounter (Signed)
Called to schedule DCCV.  Patient tells me he will have his wife call me when she gets back so that we may arrange procedure.

## 2015-06-19 NOTE — Telephone Encounter (Signed)
Informed patient that I would look into discrepancy with insurance and coverage.  Patient then tells me that he is not sure what was said because his wife was the one on the phone.   Explained that I am waiting on his wife to return my call from last week.  He states she is home now, but in bed not feeling good.  He asks that I call her back later today. Told him I would call back around 5 pm. Patient verbalized understanding and agreeable to plan.

## 2015-06-19 NOTE — Telephone Encounter (Signed)
Routed to Trinidad Curet, Therapist, sports.

## 2015-06-19 NOTE — Telephone Encounter (Signed)
New Message  Kennyth Lose Personal health coach with [employer] called states that the pt will need help with coverage for his device. She reports that the pt was advised that his pacemaker monitoring device is not covered through his insurance. Request a call back to discuss how we can help  the patient with this.

## 2015-06-20 NOTE — Telephone Encounter (Signed)
Spoke with Dylan Tyler, patient's significant other.  Patient was in the background during call.  Patient and Dylan Tyler state that they do not have the right equipment to send remote transmissions.  Reviewed use of [employer] Smart monitor and Dylan Tyler remembered that they needed her daughter's phone or tablet to send it.  They will plan to bring equipment to appointment with Dr. [name] in [place] in February for help with setting it up.  They are aware that aside from monitor and phone/tablet, no additional equipment is needed.  They deny any additional questions or concerns at this time.

## 2015-06-20 NOTE — Telephone Encounter (Signed)
Late Entry: Called and spoke with patient's wife yesterday around 5pm.  She states she is not sure about needing help with coverage for his device. Informed her that I would contact Kennyth Lose w/ [employer] to discuss. They would like to schedule DCCV for 12/27. Informed that I would arrange this and call them next day with date/instructions. Patient's wife verbalized understanding and agreeable to plan.

## 2015-06-20 NOTE — Telephone Encounter (Signed)
Kennyth Lose with [employer] explains that, from what she understands, patient told her he received a home monitor after pacemaker implant.  He does not have the right technology at home for this home monitor to work and insurance is saying they will not cover another monitor.  Advised that I would have our device clinic look into this and let patient know outcome.

## 2015-06-20 NOTE — Telephone Encounter (Signed)
Follow up    She is returning your call

## 2015-06-21 ENCOUNTER — Telehealth: Payer: Self-pay | Admitting: *Deleted

## 2015-06-21 ENCOUNTER — Encounter: Payer: Self-pay | Admitting: *Deleted

## 2015-06-21 NOTE — Telephone Encounter (Signed)
OK to speak with Mardene Celeste, per patient.  DCCV scheduled for 12/27. Reviewed letter of instructions with Mardene Celeste, patient's significant other, and mailed to home address. Informed follow up will be with Dr. [name] in [place] in February. Mardene Celeste verbalized understanding and agreeable to plan. She will review instructions with patient.

## 2015-06-25 ENCOUNTER — Encounter: Payer: Medicare HMO | Admitting: Cardiology

## 2015-07-02 ENCOUNTER — Encounter: Payer: Medicare HMO | Admitting: Cardiology

## 2015-07-02 ENCOUNTER — Ambulatory Visit (HOSPITAL_COMMUNITY)
Admission: RE | Admit: 2015-07-02 | Discharge: 2015-07-02 | Disposition: A | Discharge status: Home / Self Care | Payer: Medicare HMO | Source: Ambulatory Visit | Attending: Cardiovascular Disease | Admitting: Cardiovascular Disease

## 2015-07-02 ENCOUNTER — Ambulatory Visit (HOSPITAL_COMMUNITY): Payer: Medicare HMO | Admitting: Critical Care Medicine

## 2015-07-02 ENCOUNTER — Encounter (HOSPITAL_COMMUNITY): Payer: Self-pay | Admitting: Critical Care Medicine

## 2015-07-02 ENCOUNTER — Encounter (HOSPITAL_COMMUNITY)
Admission: RE | Disposition: A | Discharge status: Home / Self Care | Payer: Medicare HMO | Source: Ambulatory Visit | Attending: Cardiovascular Disease

## 2015-07-02 DIAGNOSIS — Z7984 Long term (current) use of oral hypoglycemic drugs: Secondary | ICD-10-CM | POA: Diagnosis not present

## 2015-07-02 DIAGNOSIS — Z8551 Personal history of malignant neoplasm of bladder: Secondary | ICD-10-CM | POA: Diagnosis not present

## 2015-07-02 DIAGNOSIS — M069 Rheumatoid arthritis, unspecified: Secondary | ICD-10-CM | POA: Diagnosis not present

## 2015-07-02 DIAGNOSIS — Z7901 Long term (current) use of anticoagulants: Secondary | ICD-10-CM | POA: Insufficient documentation

## 2015-07-02 DIAGNOSIS — E1151 Type 2 diabetes mellitus with diabetic peripheral angiopathy without gangrene: Secondary | ICD-10-CM | POA: Diagnosis not present

## 2015-07-02 DIAGNOSIS — I428 Other cardiomyopathies: Secondary | ICD-10-CM | POA: Insufficient documentation

## 2015-07-02 DIAGNOSIS — E785 Hyperlipidemia, unspecified: Secondary | ICD-10-CM | POA: Insufficient documentation

## 2015-07-02 DIAGNOSIS — Z79899 Other long term (current) drug therapy: Secondary | ICD-10-CM | POA: Insufficient documentation

## 2015-07-02 DIAGNOSIS — Z950 Presence of cardiac pacemaker: Secondary | ICD-10-CM | POA: Insufficient documentation

## 2015-07-02 DIAGNOSIS — I4891 Unspecified atrial fibrillation: Secondary | ICD-10-CM | POA: Diagnosis not present

## 2015-07-02 DIAGNOSIS — F329 Major depressive disorder, single episode, unspecified: Secondary | ICD-10-CM | POA: Insufficient documentation

## 2015-07-02 DIAGNOSIS — F419 Anxiety disorder, unspecified: Secondary | ICD-10-CM | POA: Insufficient documentation

## 2015-07-02 DIAGNOSIS — I484 Atypical atrial flutter: Secondary | ICD-10-CM | POA: Insufficient documentation

## 2015-07-02 DIAGNOSIS — G8929 Other chronic pain: Secondary | ICD-10-CM | POA: Insufficient documentation

## 2015-07-02 DIAGNOSIS — I10 Essential (primary) hypertension: Secondary | ICD-10-CM | POA: Insufficient documentation

## 2015-07-02 DIAGNOSIS — K219 Gastro-esophageal reflux disease without esophagitis: Secondary | ICD-10-CM | POA: Insufficient documentation

## 2015-07-02 DIAGNOSIS — G4733 Obstructive sleep apnea (adult) (pediatric): Secondary | ICD-10-CM | POA: Insufficient documentation

## 2015-07-02 DIAGNOSIS — Z87891 Personal history of nicotine dependence: Secondary | ICD-10-CM | POA: Diagnosis not present

## 2015-07-02 DIAGNOSIS — I4892 Unspecified atrial flutter: Secondary | ICD-10-CM

## 2015-07-02 HISTORY — PX: CARDIOVERSION: SHX1299

## 2015-07-02 LAB — GLUCOSE, CAPILLARY: GLUCOSE-CAPILLARY: 181 mg/dL — AB (ref 65–99)

## 2015-07-02 SURGERY — CARDIOVERSION
Anesthesia: General

## 2015-07-02 MED ORDER — SODIUM CHLORIDE 0.9 % IV SOLN: INTRAVENOUS | Status: DC

## 2015-07-02 MED ORDER — SODIUM CHLORIDE 0.9 % IV SOLN
INTRAVENOUS | Status: DC | PRN
  Administered 2015-07-02: 14:00:00 via INTRAVENOUS

## 2015-07-02 MED ORDER — LIDOCAINE HCL (CARDIAC) 20 MG/ML IV SOLN
INTRAVENOUS | Status: DC | PRN
  Administered 2015-07-02: 60 mg via INTRATRACHEAL

## 2015-07-02 MED ORDER — PROPOFOL 10 MG/ML IV BOLUS
INTRAVENOUS | Status: DC | PRN
  Administered 2015-07-02: 90 mg via INTRAVENOUS

## 2015-07-02 NOTE — Op Note (Signed)
Procedure: Electrical Cardioversion Indications:  Atrial Flutter, atypical  Procedure Details:  Consent: Risks of procedure as well as the alternatives and risks of each were explained to the (patient/caregiver).  Consent for procedure obtained.  Time Out: Verified patient identification, verified procedure, site/side was marked, verified correct patient position, special equipment/implants available, medications/allergies/relevent history reviewed, required imaging and test results available.  Performed  Patient placed on cardiac monitor, pulse oximetry, supplemental oxygen as necessary.  Sedation given: propofol 90 mg IV, Dr. Tobias Alexander Pacer pads placed anterior and posterior chest.  Cardioverted 1 time(s).  Cardioversion with synchronized biphasic 120J shock.  Evaluation: Findings: Post procedure EKG shows: A paced, V paced rhythm with frequent PACs and atrial couplets Complications: None Patient did tolerate procedure well.  Normal pacemaker check after cardioversion. Lower rate limit increased to 70 bpm and atrial preference pacing turned on to increase likelihood of maintaining normal rhythm. MVP turned on due to high prevalence of V pacing after cardioversion. At end of reprogramming, rhythm was A paced V sensed with occasional PACs  Time Spent Directly with the Patient:  30 minutes   Dylan Tyler 07/02/2015, 2:33 PM

## 2015-07-02 NOTE — Discharge Instructions (Signed)
Electrical Cardioversion, Care After °Refer to this sheet in the next few weeks. These instructions provide you with information on caring for yourself after your procedure. Your health care provider may also give you more specific instructions. Your treatment has been planned according to current medical practices, but problems sometimes occur. Call your health care provider if you have any problems or questions after your procedure. °WHAT TO EXPECT AFTER THE PROCEDURE °After your procedure, it is typical to have the following sensations: °· Some redness on the skin where the shocks were delivered. If this is tender, a sunburn lotion or hydrocortisone cream may help. °· Possible return of an abnormal heart rhythm within hours or days after the procedure. °HOME CARE INSTRUCTIONS °· Take medicines only as directed by your health care provider. Be sure you understand how and when to take your medicine. °· Learn how to feel your pulse and check it often. °· Limit your activity for 48 hours after the procedure or as directed by your health care provider. °· Avoid or minimize caffeine and other stimulants as directed by your health care provider. °SEEK MEDICAL CARE IF: °· You feel like your heart is beating too fast or your pulse is not regular. °· You have any questions about your medicines. °· You have bleeding that will not stop. °SEEK IMMEDIATE MEDICAL CARE IF: °· You are dizzy or feel faint. °· It is hard to breathe or you feel short of breath. °· There is a change in discomfort in your chest. °· Your speech is slurred or you have trouble moving an arm or leg on one side of your body. °· You get a serious muscle cramp that does not go away. °· Your fingers or toes turn cold or blue. °  °This information is not intended to replace advice given to you by your health care provider. Make sure you discuss any questions you have with your health care provider. °  °Document Released: 04/12/2013 Document Revised: 07/13/2014  Document Reviewed: 04/12/2013 °Elsevier Interactive Patient Education ©2016 Else[employer] Inc[employer] ° °

## 2015-07-02 NOTE — H&P (View-Only) (Signed)
Electrophysiology Office Note   Date:  06/05/2015   ID:  Dylan Tyler, Dylan Tyler 1946/01/05, MRN [id number]  PCP:  Dylan Savage, MD  Primary Electrophysiologist:  Dylan Haw, MD    No chief complaint on file.    History of Present Illness: Dylan Tyler is a 69 y.o. male who presents today for electrophysiology evaluation.    Presented today for pacemaker check. Has no evidence of issues with his pacemaker. Continues to be in atrial flutter atrial fibrillation. His fatigue is improved but still exists. He has been on Eliquis without interrupted doses since his device placement.   Today, he denies symptoms of palpitations, chest pain, shortness of breath, orthopnea, PND, lower extremity edema, claudication, dizziness, presyncope, syncope, bleeding, or neurologic sequela. The patient is tolerating medications without difficulties and is otherwise without complaint today.    Past Medical History  Diagnosis Date  . Atrial flutter ([place])     Has preferred ASA to coumadin  . Essential hypertension, benign   . Chronic pain     Followed at [facility]  . Hyperlipidemia   . Nonischemic cardiomyopathy ([place])     Cardiolite 12/09 LVEF 55%, minor luminal irregularities at cardiac catheterization  . Aortic root dilatation ([facility])     Mild  . AR (aortic regurgitation)     Mild  . Diverticulitis   . Presence of permanent cardiac pacemaker   . Kidney stones   . Heart murmur   . OSA on CPAP   . Diabetes mellitus type II   . GERD (gastroesophageal reflux disease)   . Daily headache     "@ times" (05/21/2015"  . Migraine     "at least once/week" (05/21/2015)  . Rheumatoid arthritis ([place])     "all thru my back" (05/21/2015)  . Degenerative cervical disc   . DDD (degenerative disc disease), lumbosacral   . DDD (degenerative disc disease), thoracolumbar   . Ankylosing spondylitis ([place])   . Chronic back pain greater than 3 months duration   . Anxiety   . Depression   . Bladder cancer  ([place])   . Bradycardia     MDT PPM implant 05/21/15 Dr. Curt Tyler  . Atrial fibrillation ([place])     Chads2Vasc at least 3    Past Surgical History  Procedure Laterality Date  . Knee arthroscopy Right ~ 1991  . Cardiac catheterization  08/13/11  . Ep implantable device N/A 05/21/2015    Procedure: Pacemaker Implant;  Surgeon: Dylan Heinzman Meredith Leeds, MD;  Location: [facility];  Service: Cardiovascular;  Laterality: N/A;  . Laparoscopic cholecystectomy  ~ 2008  . Cystoscopy with holmium laser lithotripsy  1990's  . Transurethral resection of bladder tumor with gyrus (turbt-gyrus)  1990's?     Current Outpatient Prescriptions  Medication Sig Dispense Refill  . albuterol (PROVENTIL) (2.5 MG/3ML) 0.083% nebulizer solution Take 2.5 mg by nebulization every 6 (six) hours as needed for wheezing.    Marland Kitchen apixaban (ELIQUIS) 5 MG TABS tablet Take 1 tablet (5 mg total) by mouth 2 (two) times daily. 60 tablet 0  . carvedilol (COREG) 6.25 MG tablet Take 1 tablet (6.25 mg total) by mouth 2 (two) times daily with a meal. 60 tablet 3  . Cholecalciferol (VITAMIN D-3) 1000 UNITS CAPS Take 1,000 Units by mouth 2 (two) times daily.     . citalopram (CELEXA) 40 MG tablet Take 20 mg by mouth 2 (two) times daily.     Marland Kitchen gabapentin (NEURONTIN) 400 MG capsule Take  400 mg by mouth 3 (three) times daily.     Marland Kitchen glimepiride (AMARYL) 2 MG tablet Take 2 mg by mouth 2 (two) times daily.  11  . HYDROcodone-acetaminophen (NORCO) 5-325 MG tablet Take 1 tablet by mouth every 6 (six) hours as needed for moderate pain. 12 tablet 0  . ibuprofen (ADVIL,MOTRIN) 200 MG tablet Take 400 mg by mouth every 6 (six) hours as needed for headache or moderate pain.    Marland Kitchen ipratropium-albuterol (DUONEB) 0.5-2.5 (3) MG/3ML SOLN Take 3 mLs by nebulization every 4 (four) hours as needed (for shortness of breath).    Marland Kitchen lisinopril (PRINIVIL,ZESTRIL) 20 MG tablet Take 1 tablet (20 mg total) by mouth daily. 30 tablet 3  . metFORMIN (GLUCOPHAGE) 1000 MG  tablet Take 1,000 mg by mouth 2 (two) times daily.    . metoCLOPramide (REGLAN) 5 MG tablet Take 5 mg by mouth every 6 (six) hours as needed for nausea or vomiting.     . nitroGLYCERIN (NITROSTAT) 0.4 MG SL tablet Place 0.4 mg under the tongue every 5 (five) minutes as needed. May repeat for up to 3 doses.     . NON FORMULARY 1 each by Other route See admin instructions. CPAP - Uses nightly    . simvastatin (ZOCOR) 40 MG tablet TAKE ONE TABLET BY MOUTH AT BEDTIME (Patient taking differently: TAKE 40 MG BY MOUTH AT BEDTIME) 30 tablet 6  . tiZANidine (ZANAFLEX) 4 MG tablet Take 4 mg by mouth 2 (two) times daily.    . vitamin B-12 (CYANOCOBALAMIN) 1000 MCG tablet Take 1,000 mcg by mouth 2 (two) times daily.      . VOLTAREN 1 % GEL Apply 2 g topically 4 (four) times daily as needed (for pain).      No current facility-administered medications for this visit.    Allergies:   Review of patient's allergies indicates no known allergies.   Social History:  The patient  reports that he quit smoking about 17 months ago. His smoking use included Cigarettes. He has a 15 pack-year smoking history. He has quit using smokeless tobacco. His smokeless tobacco use included Chew. He reports that he drinks about 1.2 oz of alcohol per week. He reports that he uses illicit drugs (Cocaine).   Family History:  The patient's family history includes Cancer in his father; Heart attack in his mother.    ROS:  Please see the history of present illness.   All other systems are reviewed and negative.    PHYSICAL EXAM: VS:  There were no vitals taken for this visit. , BMI There is no weight on file to calculate BMI. GEN: Well nourished, well developed, in no acute distress HEENT: normal Neck: no JVD, carotid bruits, or masses Cardiac: irregular rhythm; no murmurs, rubs, or gallops,no edema  Respiratory:  clear to auscultation bilaterally, normal work of breathing GI: soft, nontender, nondistended, + BS MS: no deformity  or atrophy Skin: warm and dry Neuro:  Strength and sensation are intact Psych: euthymic mood, full affect  EKG:  EKG is not ordered today   Device interrogation is reviewed today in detail.  See PaceArt for details.   Recent Labs: 05/21/2015: BUN 13; Creatinine, Ser 1.13; Hemoglobin 13.1; Platelets 205; Potassium 4.2; Sodium 137    Lipid Panel  No results found for: CHOL, TRIG, HDL, CHOLHDL, VLDL, LDLCALC, LDLDIRECT   Wt Readings from Last 3 Encounters:  05/22/15 227 lb 1.2 oz (103 kg)  04/12/15 232 lb (105.235 kg)  03/26/15 236 lb  12.8 oz (107.412 kg)      ASSESSMENT AND PLAN:  1.  Bradycardia: s/p pacemaker, no issues on 10 day check.  Continue current management.  2. Atrial fibrillation:  Currently feeling well without any major complaints. Plan is for cardioversion 3 weeks after he restarted his Eliquis. We Jaimen Melone plan on cardioversion in 2 weeks time  With continued Eliquis. Should he go back into atrial fibrillation or atrial flutter he may benefit from drug therapy or ablation.    Current medicines are reviewed at length with the patient today.   The patient does not have concerns regarding his medicines.  The following changes were made today:  none  Labs/ tests ordered today include:  No orders of the defined types were placed in this encounter.     Disposition:   FU with Ralyn Stlaurent post cardioversion  Signed, Marzetta Lanza Meredith Leeds, MD  06/05/2015 1:50 PM     [address] [address] [phone] (office) [phone] (fax)

## 2015-07-02 NOTE — Anesthesia Procedure Notes (Signed)
Date/Time: 07/02/2015 2:20 PM Performed by: Merrilyn Puma B Pre-anesthesia Checklist: Patient identified, Emergency Drugs available, Suction available, Patient being monitored and Timeout performed Patient Re-evaluated:Patient Re-evaluated prior to inductionOxygen Delivery Method: Ambu bag Intubation Type: IV induction Ventilation: Mask ventilation without difficulty Placement Confirmation: breath sounds checked- equal and bilateral Dental Injury: Teeth and Oropharynx as per pre-operative assessment

## 2015-07-02 NOTE — Transfer of Care (Signed)
Immediate Anesthesia Transfer of Care Note  Patient: Dylan Tyler  Procedure(s) Performed: Procedure(s): CARDIOVERSION (N/A)  Patient Location: Endoscopy Unit  Anesthesia Type:General  Level of Consciousness: awake, alert  and oriented  Airway & Oxygen Therapy: Patient Spontanous Breathing  Post-op Assessment: Report given to RN, Post -op Vital signs reviewed and stable and Patient moving all extremities X 4  Post vital signs: Reviewed and stable  Last Vitals:  Filed Vitals:   07/02/15 1356  BP: 134/59  Pulse: 73  Temp: 36.8 C  Resp: 14    Complications: No apparent anesthesia complications

## 2015-07-02 NOTE — Interval H&P Note (Signed)
History and Physical Interval Note:  07/02/2015 10:26 AM  Dylan Tyler  has presented today for surgery, with the diagnosis of AFIB  The various methods of treatment have been discussed with the patient and family. After consideration of risks, benefits and other options for treatment, the patient has consented to  Procedure(s): CARDIOVERSION (N/A) as a surgical intervention .  The patient's history has been reviewed, patient examined, no change in status, stable for surgery.  I have reviewed the patient's chart and labs.  Questions were answered to the patient's satisfaction.     [place]ada Mullett

## 2015-07-02 NOTE — Anesthesia Postprocedure Evaluation (Signed)
Anesthesia Post Note  Patient: Dylan Tyler  Procedure(s) Performed: Procedure(s) (LRB): CARDIOVERSION (N/A)  Patient location during evaluation: PACU Anesthesia Type: General Level of consciousness: sedated Pain management: pain level controlled Vital Signs Assessment: post-procedure vital signs reviewed and stable Respiratory status: spontaneous breathing and respiratory function stable Cardiovascular status: stable Anesthetic complications: no    Last Vitals:  Filed Vitals:   07/02/15 1445 07/02/15 1450  BP: 102/59 98/49  Pulse:    Temp:    Resp:      Last Pain: There were no vitals filed for this visit.               Laramie Meissner DANIEL

## 2015-07-02 NOTE — Anesthesia Preprocedure Evaluation (Addendum)
Anesthesia Evaluation  Patient identified by MRN, date of birth, ID band Patient awake    Reviewed: Allergy & Precautions, Patient's Chart, lab work & pertinent test results  History of Anesthesia Complications Negative for: history of anesthetic complications  Airway Mallampati: I  TM Distance: >3 FB Neck ROM: Full    Dental  (+) Edentulous Upper, Edentulous Lower, Dental Advisory Given   Pulmonary sleep apnea , former smoker,    Pulmonary exam normal        Cardiovascular hypertension, Pt. on home beta blockers + Peripheral Vascular Disease  + dysrhythmias Atrial Fibrillation + pacemaker  Rhythm:Irregular Rate:Normal     Neuro/Psych PSYCHIATRIC DISORDERS Anxiety Depression    GI/Hepatic Neg liver ROS, GERD  ,  Endo/Other  diabetes  Renal/GU negative Renal ROS     Musculoskeletal   Abdominal   Peds  Hematology   Anesthesia Other Findings   Reproductive/Obstetrics                            Anesthesia Physical Anesthesia Plan  ASA: III  Anesthesia Plan: General   Post-op Pain Management:    Induction: Intravenous  Airway Management Planned: Mask  Additional Equipment:   Intra-op Plan:   Post-operative Plan:   Informed Consent: I have reviewed the patients History and Physical, chart, labs and discussed the procedure including the risks, benefits and alternatives for the proposed anesthesia with the patient or authorized representative who has indicated his/her understanding and acceptance.   Dental advisory given  Plan Discussed with: CRNA, Anesthesiologist and Surgeon  Anesthesia Plan Comments:        Anesthesia Quick Evaluation

## 2015-07-03 ENCOUNTER — Encounter (HOSPITAL_COMMUNITY): Payer: Self-pay | Admitting: Cardiovascular Disease

## 2015-07-22 ENCOUNTER — Telehealth: Payer: Self-pay | Admitting: *Deleted

## 2015-07-22 NOTE — Telephone Encounter (Signed)
Faxed clearance to [facility] for patient to stop Eliquis 5-7 days prior to joint injection.

## 2015-08-01 ENCOUNTER — Telehealth: Payer: Self-pay | Admitting: Cardiology

## 2015-08-01 NOTE — Telephone Encounter (Signed)
Was told by last doctor to shock her husband's heart that he needs to have a EKG - wanting to have done here

## 2015-08-05 NOTE — Telephone Encounter (Signed)
Pt has a CHADS score of 3.  No history of stroke.  Ok to hold Eliquis x 48 hours prior to procedure.

## 2015-08-20 ENCOUNTER — Ambulatory Visit (INDEPENDENT_AMBULATORY_CARE_PROVIDER_SITE_OTHER): Payer: Medicare HMO | Admitting: *Deleted

## 2015-08-20 DIAGNOSIS — I4892 Unspecified atrial flutter: Secondary | ICD-10-CM

## 2015-08-20 NOTE — Progress Notes (Signed)
Patient in office for EKG.  Stated this was his first EKG since his cardioversion in December.  His next follow up is scheduled for 11/08/2015 with Dr. [name] in [place] office.       Baker Janus, this is Dr Macky Lower patient.  He was supposed to follow-up with him 2 weeks after his cardioversion. Please call patient and offer him follow-up with Dr Curt Bears in [place] at next available to discussed failed cardioversion.

## 2015-08-29 ENCOUNTER — Telehealth: Payer: Self-pay | Admitting: *Deleted

## 2015-08-29 NOTE — Progress Notes (Signed)
Dr. [name] - this is a patient of [place].  But discussed with you to transfer care to you in [place] as patient cannot get to [place] office secondary to transportation issues. Lorenda Hatchet was going to contact you today about scheduling him in [place]. Please let her know when patient can be seen in [place] office.  \ Thanks

## 2015-08-29 NOTE — Telephone Encounter (Addendum)
-----   Message from Debbora Dus sent at 08/29/2015 12:36 PM EST ----- Dr. [name] said he can either find a way to [facility] if he is having issues or he will see him in May.   Melissa

## 2015-09-04 ENCOUNTER — Other Ambulatory Visit: Payer: Self-pay | Admitting: Physician Assistant

## 2015-09-04 ENCOUNTER — Other Ambulatory Visit: Payer: Self-pay | Admitting: Cardiology

## 2015-09-04 MED ORDER — APIXABAN 5 MG PO TABS: 5.0000 mg | ORAL_TABLET | Freq: Two times a day (BID) | ORAL | Status: DC

## 2015-09-05 NOTE — Telephone Encounter (Signed)
Patient is doing ok.  No issues to report. Made aware to call office if issues arise, otherwise f/u with Allred in May. Patient verbalized understanding and agreeable to plan.

## 2015-09-12 ENCOUNTER — Other Ambulatory Visit: Payer: Self-pay | Admitting: Physician Assistant

## 2015-10-15 ENCOUNTER — Other Ambulatory Visit: Payer: Self-pay | Admitting: Physician Assistant

## 2015-10-25 ENCOUNTER — Ambulatory Visit (INDEPENDENT_AMBULATORY_CARE_PROVIDER_SITE_OTHER): Payer: Medicare HMO | Admitting: Cardiology

## 2015-10-25 ENCOUNTER — Encounter: Payer: Self-pay | Admitting: Cardiology

## 2015-10-25 VITALS — BP 107/67 | HR 76 | Ht 69.0 in | Wt 237.8 lb

## 2015-10-25 DIAGNOSIS — Z01810 Encounter for preprocedural cardiovascular examination: Secondary | ICD-10-CM

## 2015-10-25 DIAGNOSIS — R001 Bradycardia, unspecified: Secondary | ICD-10-CM | POA: Diagnosis not present

## 2015-10-25 DIAGNOSIS — I10 Essential (primary) hypertension: Secondary | ICD-10-CM

## 2015-10-25 DIAGNOSIS — I4819 Other persistent atrial fibrillation: Secondary | ICD-10-CM

## 2015-10-25 DIAGNOSIS — I481 Persistent atrial fibrillation: Secondary | ICD-10-CM | POA: Diagnosis not present

## 2015-10-25 DIAGNOSIS — Z950 Presence of cardiac pacemaker: Secondary | ICD-10-CM | POA: Diagnosis not present

## 2015-10-25 DIAGNOSIS — I359 Nonrheumatic aortic valve disorder, unspecified: Secondary | ICD-10-CM

## 2015-10-25 NOTE — Patient Instructions (Signed)
Your physician recommends that you continue on your current medications as directed. Please refer to the Current Medication list given to you today.  Your physician recommends that you schedule a follow-up appointment in: 3 months  

## 2015-10-25 NOTE — Progress Notes (Signed)
Cardiology Office Note  Date: 10/25/2015   ID: Dylan Tyler 1945/09/14, MRN [id number]  PCP: [name], MD  Primary Cardiologist: Rozann Lesches, MD   Chief Complaint  Patient presents with  . Preoperative evaluation  . Atrial fibrillation/flutter    History of Present Illness: Dylan Tyler is a 70 y.o. male that I last saw the office in September 2016. I reviewed his interval history and updated the chart. He was evaluated by Dr. Curt Bears due to symptomatic bradycardia and ultimately underwent placement of a Medtronic dual-chamber pacemaker in November of last year. Dr. Curt Bears also convinced him to go on anticoagulation for stroke prophylaxis with atrial fibrillation/flutter (patient had consistently declined anticoagulation with me previously and was on aspirin). He underwent cardioversion in December of last year which was successful, although ultimately reverted to atrial fibrillation/flutter as evidenced by his ECG in February. He has continued on Eliquis. Chart suggests that he may be switching care to Dr. [name] as he has difficulty with transportation to [place].  He is here with his wife today. He is being considered for left shoulder arthroscopic procedure with Dr. Case in [place]. Also may be having spinal nerve ablation procedure for back pain with Dr. Francesco Runner prior to this. Both would need to be accomplished off Eliquis for at least 48 hours.  He does not report any sense of palpitations. Maintaining sinus rhythm in his case is going to be difficult at best, and frankly he has done fairly well with persistent/chronic atrial fibrillation and flutter before. At this point I would recommend a strategy of heart rate control and anticoagulation, now without concerns for having trouble with symptomatic bradycardia since he has a pacemaker in place.   Past Medical History  Diagnosis Date  . Atrial fibrillation and flutter ([place])   . Essential hypertension, benign   .  Chronic pain     Followed at [facility]  . Hyperlipidemia   . Nonischemic cardiomyopathy ([place])     Cardiolite 12/09 LVEF 55%, minor luminal irregularities at cardiac catheterization  . Aortic root dilatation ([facility])     Mild  . AR (aortic regurgitation)     Mild  . Diverticulitis   . Symptomatic bradycardia     Medtronic Advisa L dual-chamber pacemaker  05/2015 - Dr. Curt Bears  . Kidney stones   . OSA on CPAP   . Diabetes mellitus type II   . GERD (gastroesophageal reflux disease)   . Migraine   . Rheumatoid arthritis ([place])   . Degenerative cervical disc   . DDD (degenerative disc disease), lumbosacral   . DDD (degenerative disc disease), thoracolumbar   . Ankylosing spondylitis ([place])   . Anxiety   . Depression   . Bladder cancer [facility])     Past Surgical History  Procedure Laterality Date  . Knee arthroscopy Right ~ 1991  . Cardiac catheterization  08/13/11  . Ep implantable device N/A 05/21/2015    Procedure: Pacemaker Implant;  Surgeon: Will Meredith Leeds, MD;  Location: [place] [facility];  Service: Cardiovascular;  Laterality: N/A;  . Laparoscopic cholecystectomy  ~ 2008  . Cystoscopy with holmium laser lithotripsy  1990's  . Transurethral resection of bladder tumor with gyrus (turbt-gyrus)  1990's?  . Cardioversion N/A 07/02/2015    Procedure: CARDIOVERSION;  Surgeon: Sanda Klein, MD;  [facility];  Service: Cardiovascular;  Laterality: N/A;    Current Outpatient Prescriptions  Medication Sig Dispense Refill  . albuterol (PROVENTIL) (2.5 MG/3ML) 0.083% nebulizer solution Take  2.5 mg by nebulization every 6 (six) hours as needed for wheezing.    Marland Kitchen apixaban (ELIQUIS) 5 MG TABS tablet Take 1 tablet (5 mg total) by mouth 2 (two) times daily. 60 tablet 6  . carvedilol (COREG) 6.25 MG tablet TAKE ONE TABLET BY MOUTH TWICE DAILY WITH A MEAL. 180 tablet 1  . Cholecalciferol (VITAMIN D-3) 1000 UNITS CAPS Take 1,000 Units by mouth 2 (two) times daily.     . citalopram  (CELEXA) 40 MG tablet Take 20 mg by mouth 2 (two) times daily.     Marland Kitchen gabapentin (NEURONTIN) 400 MG capsule Take 400 mg by mouth 3 (three) times daily.     Marland Kitchen glimepiride (AMARYL) 2 MG tablet Take 2 mg by mouth 2 (two) times daily.  11  . ibuprofen (ADVIL,MOTRIN) 200 MG tablet Take 400 mg by mouth every 6 (six) hours as needed for headache or moderate pain.    Marland Kitchen ipratropium-albuterol (DUONEB) 0.5-2.5 (3) MG/3ML SOLN Take 3 mLs by nebulization every 4 (four) hours as needed (for shortness of breath).    Marland Kitchen lisinopril (PRINIVIL,ZESTRIL) 20 MG tablet TAKE ONE TABLET BY MOUTH DAILY. 30 tablet 2  . metFORMIN (GLUCOPHAGE) 1000 MG tablet Take 1,000 mg by mouth 2 (two) times daily.    . metoCLOPramide (REGLAN) 5 MG tablet Take 5 mg by mouth every 6 (six) hours as needed for nausea or vomiting.     . nitroGLYCERIN (NITROSTAT) 0.4 MG SL tablet Place 0.4 mg under the tongue every 5 (five) minutes as needed. May repeat for up to 3 doses.     . NON FORMULARY 1 each by Other route See admin instructions. CPAP - Uses nightly    . simvastatin (ZOCOR) 40 MG tablet TAKE ONE TABLET BY MOUTH AT BEDTIME 30 tablet 6  . tiZANidine (ZANAFLEX) 4 MG tablet Take 4 mg by mouth 2 (two) times daily.    . vitamin B-12 (CYANOCOBALAMIN) 1000 MCG tablet Take 1,000 mcg by mouth 2 (two) times daily.      . VOLTAREN 1 % GEL Apply 2 g topically 4 (four) times daily as needed (for pain).      No current facility-administered medications for this visit.   Allergies:  Review of patient's allergies indicates no known allergies.   Social History: The patient  reports that he quit smoking about 22 months ago. His smoking use included Cigarettes. He has a 15 pack-year smoking history. He has quit using smokeless tobacco. His smokeless tobacco use included Chew. He reports that he drinks about 1.2 oz of alcohol per week. He reports that he uses illicit drugs (Cocaine).   Family History: The patient's family history includes Cancer in his  father; Heart attack in his mother.   ROS:  Please see the history of present illness. Otherwise, complete review of systems is positive for chronic back pain and shoulder pain.  All other systems are reviewed and negative.   Physical Exam: VS:  BP 107/67 mmHg  Pulse 76  Ht 5\' 9"  (1.753 m)  Wt 237 lb 12.8 oz (107.865 kg)  BMI 35.10 kg/m2  SpO2 96%, BMI Body mass index is 35.1 kg/(m^2).  Wt Readings from Last 3 Encounters:  10/25/15 237 lb 12.8 oz (107.865 kg)  07/02/15 236 lb (107.049 kg)  05/22/15 227 lb 1.2 oz (103 kg)    No distress.  HEENT: Conjunctiva and lids normal, oropharynx with poor dentition.  Neck: Supple, no elevated JVP or carotid bruits, no thyromegaly.  Lungs: Diminished breath  sounds throughout, nonlabored, no wheezing.  Cardiac: Slow irregularly irregular rate and rhythm, no S3 gallop or rub.  Abdomen: Obese, nontender, bowel sounds present.  Skin: Warm and dry, scattered tattoos.  Extremities: 1+ edema, distal pulses one plus.   ECG: I personally reviewed the prior tracing from 08/20/2015 which showed ventricular pacing with underlying atrial flutter.  Recent Labwork: 05/21/2015: BUN 13; Creatinine, Ser 1.13; Hemoglobin 13.1; Platelets 205; Potassium 4.2; Sodium 137   Other Studies Reviewed Today:  Study Conclusions  - Left ventricle: The cavity size was normal. Wall thickness was  increased increased in a pattern of mild to moderate LVH.  Systolic function was normal. The estimated ejection fraction was  in the range of 50% to 55%. Heart rhythm is coarse atrial  fibrillation/flutter. Although no diagnostic regional wall motion  abnormality was identified, this possibility cannot be completely  excluded on the basis of this study. The study is not technically  sufficient to allow evaluation of LV diastolic function. - Aortic valve: Mildly calcified annulus. Trileaflet. There was  mild regurgitation. - Aortic root: The aortic root was  mildly dilated. - Mitral valve: There was trivial regurgitation. - Left atrium: The atrium was mildly to moderately dilated. - Right ventricle: The cavity size was normal. - Right atrium: The atrium was mildly dilated. Central venous  pressure (est): 3 mm Hg. - Atrial septum: There was a patent foramen ovale with left to  right shunt by color Doppler. - Tricuspid valve: There was trivial regurgitation. - Pulmonary arteries: PA peak pressure: 23 mm Hg (S). - Pericardium, extracardiac: A trivial pericardial effusion was  identified.  Impressions:  - Mild to moderate LVH with LVEF 50-55%, indeterminate diastolic  function. Mild to moderate left atrial enlargement. Mild aortic  regurgitation. Mildly dilated aortic root. Trivial tricuspid  regurgitation with PASP 23 mmHg. PFO with right to left shunt by  color Doppler. RV contraction is normal.  Assessment and Plan:  1. Persistent atrial fibrillation flutter, underwent cardioversion in December 2016 with recurrence documented in February of this year. I would not make any further attempts at restoring sinus rhythm and continue with strategy of heart rate control and anticoagulation.  2. Symptomatic bradycardia status post Medtronic pacemaker placement by Dr. Curt Bears in November 2016. He will be seeing Dr. [name] for follow up going forward, has an office visit in early May.  3. Preoperative evaluation prior to elective arthroscopic left shoulder surgery per Dr. Case and also possibly spinal nerve ablation for chronic back pain by Dr. Francesco Runner. Neither procedure has actually been scheduled. I was only aware of the procedure being considered by Dr. Case. From a cardiac perspective, he should be able to proceed with both at an acceptable risk. He will need to be off Eliquis for at least 48 hours prior to both of these procedures.   4. Essential hypertension, blood pressure is normal today.  5. Mildly dilated aortic root with mild  aortic regurgitation, asymptomatic.  Current medicines were reviewed with the patient today.  Disposition: FU with me in 3 months.   Signed, [name], MD, [facility] 10/25/2015 1:46 PM    [facility], [place], [place] [address] Phone: [phone]; Fax: [phone]

## 2015-11-08 ENCOUNTER — Encounter: Payer: Self-pay | Admitting: Internal Medicine

## 2015-11-08 ENCOUNTER — Ambulatory Visit (INDEPENDENT_AMBULATORY_CARE_PROVIDER_SITE_OTHER): Payer: Medicare HMO | Admitting: Internal Medicine

## 2015-11-08 VITALS — BP 144/76 | HR 76 | Ht 69.0 in | Wt 237.0 lb

## 2015-11-08 DIAGNOSIS — I10 Essential (primary) hypertension: Secondary | ICD-10-CM | POA: Diagnosis not present

## 2015-11-08 DIAGNOSIS — R001 Bradycardia, unspecified: Secondary | ICD-10-CM

## 2015-11-08 DIAGNOSIS — I481 Persistent atrial fibrillation: Secondary | ICD-10-CM

## 2015-11-08 DIAGNOSIS — I484 Atypical atrial flutter: Secondary | ICD-10-CM

## 2015-11-08 DIAGNOSIS — I4891 Unspecified atrial fibrillation: Secondary | ICD-10-CM | POA: Insufficient documentation

## 2015-11-08 DIAGNOSIS — I4819 Other persistent atrial fibrillation: Secondary | ICD-10-CM

## 2015-11-08 LAB — CUP PACEART INCLINIC DEVICE CHECK
Battery Remaining Longevity: 78 mo
Brady Statistic AP VP Percent: 3.66 %
Brady Statistic AP VS Percent: 0.13 %
Brady Statistic AS VS Percent: 3.83 %
Lead Channel Impedance Value: 342 Ohm
Lead Channel Impedance Value: 380 Ohm
Lead Channel Pacing Threshold Amplitude: 0.75 V
Lead Channel Setting Pacing Amplitude: 2 V
Lead Channel Setting Pacing Amplitude: 2.5 V
Lead Channel Setting Sensing Sensitivity: 2.8 mV
MDC IDC MSMT BATTERY VOLTAGE: 3 V
MDC IDC MSMT LEADCHNL RA IMPEDANCE VALUE: 475 Ohm
MDC IDC MSMT LEADCHNL RA SENSING INTR AMPL: 2.75 mV
MDC IDC MSMT LEADCHNL RA SENSING INTR AMPL: 3 mV
MDC IDC MSMT LEADCHNL RV IMPEDANCE VALUE: 456 Ohm
MDC IDC MSMT LEADCHNL RV PACING THRESHOLD PULSEWIDTH: 0.4 ms
MDC IDC MSMT LEADCHNL RV SENSING INTR AMPL: 10.125 mV
MDC IDC MSMT LEADCHNL RV SENSING INTR AMPL: 13 mV
MDC IDC SESS DTM: 20170505115636
MDC IDC SET LEADCHNL RV PACING PULSEWIDTH: 0.4 ms
MDC IDC STAT BRADY AS VP PERCENT: 92.39 %
MDC IDC STAT BRADY RA PERCENT PACED: 3.78 %
MDC IDC STAT BRADY RV PERCENT PACED: 96.04 %

## 2015-11-08 NOTE — Progress Notes (Signed)
PCP: [name], MD Primary Cardiologist:  NEHAN BROCKS is a 70 y.o. male who presents today for routine electrophysiology followup.  He has longstanding persistent afib.  He has fatigue but otherwise is well.  S/p PPM by Dr Curt Bears for symptomatic bradycardia. + chronic SOB and edema.  Not very active and limited by back issues. Today, he denies symptoms of palpitations, chest pain,dizziness, presyncope, or syncope.  The patient is otherwise without complaint today.   Past Medical History  Diagnosis Date  . Atrial fibrillation and flutter ([employer])   . Essential hypertension, benign   . Chronic pain     Followed at [facility]  . Hyperlipidemia   . Nonischemic cardiomyopathy ([place])     Cardiolite 12/09 LVEF 55%, minor luminal irregularities at cardiac catheterization  . Aortic root dilatation ([facility])     Mild  . AR (aortic regurgitation)     Mild  . Diverticulitis   . Symptomatic bradycardia     Medtronic Advisa L dual-chamber pacemaker  05/2015 - Dr. Curt Bears  . Kidney stones   . OSA on CPAP   . Diabetes mellitus type II   . GERD (gastroesophageal reflux disease)   . Migraine   . Rheumatoid arthritis ([place])   . Degenerative cervical disc   . DDD (degenerative disc disease), lumbosacral   . DDD (degenerative disc disease), thoracolumbar   . Ankylosing spondylitis ([place])   . Anxiety   . Depression   . Bladder cancer [facility])    Past Surgical History  Procedure Laterality Date  . Knee arthroscopy Right ~ 1991  . Cardiac catheterization  08/13/11  . Ep implantable device N/A 05/21/2015    MDT Advisa DR MRI pacemaker implanted by Dr Curt Bears  . Laparoscopic cholecystectomy  ~ 2008  . Cystoscopy with holmium laser lithotripsy  1990's  . Transurethral resection of bladder tumor with gyrus (turbt-gyrus)  1990's?  . Cardioversion N/A 07/02/2015    Procedure: CARDIOVERSION;  Surgeon: Sanda Klein, MD;  [facility];  Service: Cardiovascular;  Laterality: N/A;    ROS- all systems  are reviewed and negative except as per HPI above  Current Outpatient Prescriptions  Medication Sig Dispense Refill  . albuterol (PROVENTIL) (2.5 MG/3ML) 0.083% nebulizer solution Take 2.5 mg by nebulization every 6 (six) hours as needed for wheezing.    Marland Kitchen apixaban (ELIQUIS) 5 MG TABS tablet Take 1 tablet (5 mg total) by mouth 2 (two) times daily. 60 tablet 6  . carvedilol (COREG) 6.25 MG tablet TAKE ONE TABLET BY MOUTH TWICE DAILY WITH A MEAL. 180 tablet 1  . Cholecalciferol (VITAMIN D-3) 1000 UNITS CAPS Take 1,000 Units by mouth 2 (two) times daily.     . citalopram (CELEXA) 40 MG tablet Take 20 mg by mouth 2 (two) times daily.     Marland Kitchen gabapentin (NEURONTIN) 400 MG capsule Take 400 mg by mouth 3 (three) times daily.     Marland Kitchen glimepiride (AMARYL) 2 MG tablet Take 2 mg by mouth 2 (two) times daily.  11  . ibuprofen (ADVIL,MOTRIN) 200 MG tablet Take 400 mg by mouth every 6 (six) hours as needed for headache or moderate pain.    Marland Kitchen ipratropium-albuterol (DUONEB) 0.5-2.5 (3) MG/3ML SOLN Take 3 mLs by nebulization every 4 (four) hours as needed (for shortness of breath).    Marland Kitchen lisinopril (PRINIVIL,ZESTRIL) 20 MG tablet TAKE ONE TABLET BY MOUTH DAILY. 30 tablet 2  . metFORMIN (GLUCOPHAGE) 1000 MG tablet Take 1,000 mg by mouth 2 (two) times daily.    Marland Kitchen  metoCLOPramide (REGLAN) 5 MG tablet Take 5 mg by mouth every 6 (six) hours as needed for nausea or vomiting.     . nitroGLYCERIN (NITROSTAT) 0.4 MG SL tablet Place 0.4 mg under the tongue every 5 (five) minutes as needed. May repeat for up to 3 doses.     . NON FORMULARY 1 each by Other route See admin instructions. CPAP - Uses nightly    . simvastatin (ZOCOR) 40 MG tablet TAKE ONE TABLET BY MOUTH AT BEDTIME 30 tablet 6  . tiZANidine (ZANAFLEX) 4 MG tablet Take 4 mg by mouth 2 (two) times daily.    . vitamin B-12 (CYANOCOBALAMIN) 1000 MCG tablet Take 1,000 mcg by mouth 2 (two) times daily.      . VOLTAREN 1 % GEL Apply 2 g topically 4 (four) times daily as  needed (for pain).      No current facility-administered medications for this visit.    Physical Exam: Filed Vitals:   11/08/15 0831 11/08/15 0836  BP: 150/78 144/76  Pulse: 79 76  Height: 5\' 9"  (1.753 m)   Weight: 237 lb (107.502 kg)   SpO2: 98%     GEN- The patient is well appearing, alert and oriented x 3 today.  Walks slowly with a cane Head- normocephalic, atraumatic Eyes-  Sclera clear, conjunctiva pink Ears- hearing intact Oropharynx- clear Lungs- Clear to ausculation bilaterally, normal work of breathing Chest- pacemaker pocket is well healed Heart- Regular rate and rhythm, no murmurs, rubs or gallops, PMI not laterally displaced GI- soft, NT, ND, + BS Extremities- no clubbing, cyanosis, or edema  Pacemaker interrogation- reviewed in detail today,  See PACEART report  Assessment and Plan:  1. Symptomatic Bradycardia Normal pacemaker function See Pace Art report Will reprogram VVIR and adjust rate response to hopefully give more energy  2. Long standing persistent afib Per Dr Domenic Polite, will plan rate control going forward Continue long term anticoagualtion  3. Obesity Weight loss advised  4. HTN Stable No change required today  carelink Return to see me in 1 year Follow-up with Dr Domenic Polite as scheduled  Thompson Grayer MD, [facility] 11/08/2015 9:37 AM

## 2015-11-08 NOTE — Patient Instructions (Signed)
Your physician recommends that you continue on your current medications as directed. Please refer to the Current Medication list given to you today. Device check on 02/10/16. Your physician recommends that you schedule a follow-up appointment in: 1 year with Dr. Allred. Please schedule this appointment today before leaving the office.  

## 2016-01-26 NOTE — Progress Notes (Signed)
Cardiology Office Note  Date: 01/27/2016   ID: Dylan Tyler, Dylan Tyler 02/14/1946, MRN [id number]  PCP: Dylan Savage, MD  Primary Cardiologist: Dylan Lesches, MD   Chief Complaint  Patient presents with  . Atrial Fibrillation  . History of cardiomyopathy    History of Present Illness: Dylan Tyler is a 70 y.o. male last seen in April. He is here today with his wife for a follow-up visit. He does not report any chest pain or palpitations.  He had a follow-up visit in the device clinic with Dr. Rayann Tyler in May. Device was reprogrammed to VVIR at that time with adjusted rate response.  He had a fall in June, tore his left rotator cuff. This was repaired by Dr. Case, still in a sling. He was temporarily off Eliquis.  I reviewed his medications. Cardiac regimen includes Coreg, Eliquis, lisinopril, and Zocor.  Echocardiogram from last year showed LVEF improved to the range of 50-55%.  Past Medical History:  Diagnosis Date  . Ankylosing spondylitis (Dylan Tyler)   . Anxiety   . Aortic root dilatation (HCC)    Mild  . AR (aortic regurgitation)    Mild  . Atrial fibrillation and flutter (Dylan Tyler)   . Bladder cancer (Dylan Tyler)   . Chronic pain    Followed at [facility]  . DDD (degenerative disc disease), lumbosacral   . DDD (degenerative disc disease), thoracolumbar   . Degenerative cervical disc   . Depression   . Diabetes mellitus type II   . Diverticulitis   . Essential hypertension, benign   . GERD (gastroesophageal reflux disease)   . Hyperlipidemia   . Kidney stones   . Migraine   . Nonischemic cardiomyopathy (Dylan Tyler)    Cardiolite 12/09 LVEF 55%, minor luminal irregularities at cardiac catheterization  . OSA on CPAP   . Rheumatoid arthritis (Dylan Tyler)   . Symptomatic bradycardia    Medtronic Advisa L dual-chamber pacemaker  05/2015 - Dylan Tyler    Past Surgical History:  Procedure Laterality Date  . CARDIAC CATHETERIZATION  08/13/11  . CARDIOVERSION N/A 07/02/2015   Procedure:  CARDIOVERSION;  Surgeon: Dylan Klein, MD;  Location: [facility];  Service: Cardiovascular;  Laterality: N/A;  . CYSTOSCOPY WITH HOLMIUM LASER LITHOTRIPSY  1990's  . EP IMPLANTABLE DEVICE N/A 05/21/2015   MDT Advisa DR MRI pacemaker implanted by Dr Dylan Tyler  . KNEE ARTHROSCOPY Right ~ 1991  . LAPAROSCOPIC CHOLECYSTECTOMY  ~ 2008  . TRANSURETHRAL RESECTION OF BLADDER TUMOR WITH GYRUS (TURBT-GYRUS)  1990's?    Current Outpatient Prescriptions  Medication Sig Dispense Refill  . albuterol (PROVENTIL) (2.5 MG/3ML) 0.083% nebulizer solution Take 2.5 mg by nebulization every 6 (six) hours as needed for wheezing.    Marland Kitchen apixaban (ELIQUIS) 5 MG TABS tablet Take 1 tablet (5 mg total) by mouth 2 (two) times daily. 60 tablet 6  . carvedilol (COREG) 6.25 MG tablet TAKE ONE TABLET BY MOUTH TWICE DAILY WITH A MEAL. 180 tablet 1  . Cholecalciferol (VITAMIN D-3) 1000 UNITS CAPS Take 1,000 Units by mouth 2 (two) times daily.     . citalopram (CELEXA) 40 MG tablet Take 20 mg by mouth 2 (two) times daily.     Marland Kitchen gabapentin (NEURONTIN) 400 MG capsule Take 400 mg by mouth 3 (three) times daily.     Marland Kitchen glimepiride (AMARYL) 2 MG tablet Take 2 mg by mouth 2 (two) times daily.  11  . ibuprofen (ADVIL,MOTRIN) 200 MG tablet Take 400 mg by mouth every 6 (six) hours as  needed for headache or moderate pain.    Marland Kitchen ipratropium-albuterol (DUONEB) 0.5-2.5 (3) MG/3ML SOLN Take 3 mLs by nebulization every 4 (four) hours as needed (for shortness of breath).    Marland Kitchen lisinopril (PRINIVIL,ZESTRIL) 20 MG tablet TAKE ONE TABLET BY MOUTH DAILY. 30 tablet 2  . metFORMIN (GLUCOPHAGE) 1000 MG tablet Take 1,000 mg by mouth 2 (two) times daily.    . metoCLOPramide (REGLAN) 5 MG tablet Take 5 mg by mouth every 6 (six) hours as needed for nausea or vomiting.     . nitroGLYCERIN (NITROSTAT) 0.4 MG SL tablet Place 0.4 mg under the tongue every 5 (five) minutes as needed. May repeat for up to 3 doses.     . NON FORMULARY 1 each by Other route See  admin instructions. CPAP - Uses nightly    . oxyCODONE-acetaminophen (PERCOCET/ROXICET) 5-325 MG tablet as needed.    . simvastatin (ZOCOR) 40 MG tablet TAKE ONE TABLET BY MOUTH AT BEDTIME 30 tablet 6  . tiZANidine (ZANAFLEX) 4 MG tablet Take 4 mg by mouth 2 (two) times daily.    . vitamin B-12 (CYANOCOBALAMIN) 1000 MCG tablet Take 1,000 mcg by mouth 2 (two) times daily.      . VOLTAREN 1 % GEL Apply 2 g topically 4 (four) times daily as needed (for pain).      No current facility-administered medications for this visit.    Allergies:  Review of patient's allergies indicates no known allergies.   Social History: The patient  reports that he quit smoking about 2 years ago. His smoking use included Cigarettes. He started smoking about 56 years ago. He has a 15.00 pack-year smoking history. He has quit using smokeless tobacco. His smokeless tobacco use included Chew. He reports that he drinks about 1.2 oz of alcohol per week . He reports that he uses drugs, including Cocaine.   ROS:  Please see the history of present illness. Otherwise, complete review of systems is positive for left shoulder stiffness, also chronic right knee pain..  All other systems are reviewed and negative.   Physical Exam: VS:  BP 115/66   Pulse 70   Ht 5\' 9"  (1.753 m)   Wt 228 lb 3.2 oz (103.5 kg)   SpO2 98%   BMI 33.70 kg/m , BMI Body mass index is 33.7 kg/m.  Wt Readings from Last 3 Encounters:  01/27/16 228 lb 3.2 oz (103.5 kg)  11/08/15 237 lb (107.5 kg)  10/25/15 237 lb 12.8 oz (107.9 kg)    Overweight male, appears comfortable. HEENT: Conjunctiva and lids normal, oropharynx with poor dentition.  Neck: Supple, no elevated JVP or carotid bruits, no thyromegaly.  Lungs: Diminished breath sounds throughout, nonlabored, no wheezing.  Cardiac: RRR, no S3 gallop or rub.  Abdomen: Obese, nontender, bowel sounds present.  Skin: Warm and dry, scattered tattoos.  Extremities: 1+ edema, distal pulses one  plus. Left arm in sling. Musculoskeletal: No kyphosis. Neuropsychiatric: Alert and oriented 3, affect appropriate.  ECG: I personally reviewed the tracing from 08/20/2015 which showed ventricular pacing.  Recent Labwork: 05/21/2015: BUN 13; Creatinine, Ser 1.13; Hemoglobin 13.1; Platelets 205; Potassium 4.2; Sodium 137   Other Studies Reviewed Today:  Echocardiogram 9/39/2016: Study Conclusions  - Left ventricle: The cavity size was normal. Wall thickness was   increased increased in a pattern of mild to moderate LVH.   Systolic function was normal. The estimated ejection fraction was   in the range of 50% to 55%. Heart rhythm is coarse atrial  fibrillation/flutter. Although no diagnostic regional wall motion   abnormality was identified, this possibility cannot be completely   excluded on the basis of this study. The study is not technically   sufficient to allow evaluation of LV diastolic function. - Aortic valve: Mildly calcified annulus. Trileaflet. There was   mild regurgitation. - Aortic root: The aortic root was mildly dilated. - Mitral valve: There was trivial regurgitation. - Left atrium: The atrium was mildly to moderately dilated. - Right ventricle: The cavity size was normal. - Right atrium: The atrium was mildly dilated. Central venous   pressure (est): 3 mm Hg. - Atrial septum: There was a patent foramen ovale with left to   right shunt by color Doppler. - Tricuspid valve: There was trivial regurgitation. - Pulmonary arteries: PA peak pressure: 23 mm Hg (S). - Pericardium, extracardiac: A trivial pericardial effusion was   identified.  Impressions:  - Mild to moderate LVH with LVEF 50-55%, indeterminate diastolic   function. Mild to moderate left atrial enlargement. Mild aortic   regurgitation. Mildly dilated aortic root. Trivial tricuspid   regurgitation with PASP 23 mmHg. PFO with right to left shunt by   color Doppler. RV contraction is  normal.  Assessment and Plan:  1. Persistent atrial fibrillation. Continue strategy of heart rate control and anticoagulation. He is tolerating current regimen well. No bleeding problems on Eliquis.  2. History of cardiomyopathy with improvement in LVEF as outlined above, most recently in the range of 50-55%.  3. Mild aortic root dilatation with mild aortic regurgitation, asymptomatic.  4. Symptomatic bradycardia status post Medtronic pacemaker placement. He follows with Dr. Rayann Tyler in the device clinic at this point.  5. Essential hypertension, blood pressure is well controlled today.  Current medicines were reviewed with the patient today.  Disposition: Follow-up with me in 6 months.  Signed, [name], MD, [facility] 01/27/2016 1:14 PM    [facility] [place], [place], [place] [address] Phone: [phone]; Fax: [phone]

## 2016-01-27 ENCOUNTER — Ambulatory Visit (INDEPENDENT_AMBULATORY_CARE_PROVIDER_SITE_OTHER): Payer: Medicare HMO | Admitting: Cardiology

## 2016-01-27 ENCOUNTER — Encounter: Payer: Self-pay | Admitting: Cardiology

## 2016-01-27 VITALS — BP 115/66 | HR 70 | Ht 69.0 in | Wt 228.2 lb

## 2016-01-27 DIAGNOSIS — I10 Essential (primary) hypertension: Secondary | ICD-10-CM

## 2016-01-27 DIAGNOSIS — I482 Chronic atrial fibrillation, unspecified: Secondary | ICD-10-CM

## 2016-01-27 DIAGNOSIS — Z8679 Personal history of other diseases of the circulatory system: Secondary | ICD-10-CM | POA: Diagnosis not present

## 2016-01-27 DIAGNOSIS — R001 Bradycardia, unspecified: Secondary | ICD-10-CM

## 2016-01-27 DIAGNOSIS — I359 Nonrheumatic aortic valve disorder, unspecified: Secondary | ICD-10-CM

## 2016-01-27 NOTE — Patient Instructions (Signed)
Medication Instructions:   Your physician recommends that you continue on your current medications as directed. Please refer to the Current Medication list given to you today.  Labwork:  NONE  Testing/Procedures:  NONE  Follow-Up:  Your physician recommends that you schedule a follow-up appointment in: 6 month. You will receive a reminder letter in the mail in about 4 months reminding you to call and schedule your appointment. If you don't receive this letter, please contact our office.  Any Other Special Instructions Will Be Listed Below (If Applicable).  If you need a refill on your cardiac medications before your next appointment, please call your pharmacy. 

## 2016-02-10 ENCOUNTER — Telehealth: Payer: Self-pay | Admitting: Cardiology

## 2016-02-10 ENCOUNTER — Encounter: Payer: Medicare HMO | Admitting: *Deleted

## 2016-02-10 NOTE — Telephone Encounter (Signed)
LMOVM reminding pt to send remote transmission.   

## 2016-02-13 ENCOUNTER — Encounter: Payer: Self-pay | Admitting: Cardiology

## 2016-02-13 ENCOUNTER — Other Ambulatory Visit: Payer: Self-pay | Admitting: *Deleted

## 2016-02-13 MED ORDER — CARVEDILOL 6.25 MG PO TABS: 6.2500 mg | ORAL_TABLET | Freq: Two times a day (BID) | ORAL | 2 refills | Status: DC

## 2016-02-13 MED ORDER — SIMVASTATIN 40 MG PO TABS: 40.0000 mg | ORAL_TABLET | Freq: Every day | ORAL | 3 refills | Status: DC

## 2016-02-13 NOTE — Progress Notes (Signed)
Letter  

## 2016-02-26 ENCOUNTER — Ambulatory Visit (INDEPENDENT_AMBULATORY_CARE_PROVIDER_SITE_OTHER): Payer: Medicare HMO | Admitting: *Deleted

## 2016-02-26 ENCOUNTER — Other Ambulatory Visit: Payer: Self-pay | Admitting: *Deleted

## 2016-02-26 DIAGNOSIS — R001 Bradycardia, unspecified: Secondary | ICD-10-CM

## 2016-02-26 DIAGNOSIS — Z950 Presence of cardiac pacemaker: Secondary | ICD-10-CM

## 2016-02-26 MED ORDER — SIMVASTATIN 40 MG PO TABS: 40.0000 mg | ORAL_TABLET | Freq: Every day | ORAL | 3 refills | Status: DC

## 2016-02-27 NOTE — Progress Notes (Signed)
Remote pacemaker transmission.   

## 2016-03-04 ENCOUNTER — Encounter: Payer: Self-pay | Admitting: Cardiology

## 2016-03-10 LAB — CUP PACEART REMOTE DEVICE CHECK
Brady Statistic AP VP Percent: 0 %
Brady Statistic AS VP Percent: 96.78 %
Brady Statistic RA Percent Paced: 0 %
Lead Channel Impedance Value: 456 Ohm
Lead Channel Pacing Threshold Pulse Width: 0.4 ms
Lead Channel Sensing Intrinsic Amplitude: 12.125 mV
Lead Channel Sensing Intrinsic Amplitude: 12.125 mV
Lead Channel Setting Pacing Amplitude: 2.5 V
Lead Channel Setting Sensing Sensitivity: 2.8 mV
MDC IDC MSMT BATTERY REMAINING LONGEVITY: 73 mo
MDC IDC MSMT BATTERY VOLTAGE: 3 V
MDC IDC MSMT LEADCHNL RA IMPEDANCE VALUE: 342 Ohm
MDC IDC MSMT LEADCHNL RA IMPEDANCE VALUE: 456 Ohm
MDC IDC MSMT LEADCHNL RA PACING THRESHOLD AMPLITUDE: 0.5 V
MDC IDC MSMT LEADCHNL RA SENSING INTR AMPL: 3.375 mV
MDC IDC MSMT LEADCHNL RA SENSING INTR AMPL: 3.375 mV
MDC IDC MSMT LEADCHNL RV IMPEDANCE VALUE: 380 Ohm
MDC IDC MSMT LEADCHNL RV PACING THRESHOLD AMPLITUDE: 0.625 V
MDC IDC MSMT LEADCHNL RV PACING THRESHOLD PULSEWIDTH: 0.4 ms
MDC IDC SESS DTM: 20170823220229
MDC IDC SET LEADCHNL RV PACING PULSEWIDTH: 0.4 ms
MDC IDC STAT BRADY AP VS PERCENT: 0 %
MDC IDC STAT BRADY AS VS PERCENT: 3.22 %
MDC IDC STAT BRADY RV PERCENT PACED: 96.78 %

## 2016-03-24 ENCOUNTER — Other Ambulatory Visit: Payer: Self-pay | Admitting: Cardiology

## 2016-05-18 ENCOUNTER — Other Ambulatory Visit: Payer: Self-pay | Admitting: *Deleted

## 2016-05-18 MED ORDER — APIXABAN 5 MG PO TABS: 5.0000 mg | ORAL_TABLET | Freq: Two times a day (BID) | ORAL | 3 refills | Status: DC

## 2016-05-18 NOTE — Telephone Encounter (Signed)
Request for eliquis prescription to go along with application for patient assistance. Rx will be faxed to [phone] once its signed by the doctor.

## 2016-05-27 ENCOUNTER — Telehealth: Payer: Self-pay | Admitting: Cardiology

## 2016-05-27 ENCOUNTER — Ambulatory Visit (INDEPENDENT_AMBULATORY_CARE_PROVIDER_SITE_OTHER): Payer: Medicare HMO | Admitting: *Deleted

## 2016-05-27 DIAGNOSIS — I429 Cardiomyopathy, unspecified: Secondary | ICD-10-CM

## 2016-05-27 NOTE — Telephone Encounter (Signed)
Spoke with pt and reminded pt of remote transmission that is due today. Pt verbalized understanding.   

## 2016-06-01 NOTE — Progress Notes (Signed)
Remote pacemaker transmission.   

## 2016-06-04 ENCOUNTER — Encounter: Payer: Self-pay | Admitting: Cardiology

## 2016-06-13 LAB — CUP PACEART REMOTE DEVICE CHECK
Battery Remaining Longevity: 83 mo
Brady Statistic AP VS Percent: 0 %
Brady Statistic AS VS Percent: 7.83 %
Brady Statistic RV Percent Paced: 92.17 %
Date Time Interrogation Session: 20171122203539
Lead Channel Impedance Value: 342 Ohm
Lead Channel Impedance Value: 456 Ohm
Lead Channel Impedance Value: 494 Ohm
Lead Channel Pacing Threshold Amplitude: 0.625 V
Lead Channel Sensing Intrinsic Amplitude: 10.625 mV
Lead Channel Sensing Intrinsic Amplitude: 3 mV
Lead Channel Sensing Intrinsic Amplitude: 3 mV
Lead Channel Setting Pacing Amplitude: 2.5 V
Lead Channel Setting Pacing Pulse Width: 0.4 ms
Lead Channel Setting Sensing Sensitivity: 2.8 mV
MDC IDC MSMT BATTERY VOLTAGE: 3 V
MDC IDC MSMT LEADCHNL RA PACING THRESHOLD AMPLITUDE: 0.5 V
MDC IDC MSMT LEADCHNL RA PACING THRESHOLD PULSEWIDTH: 0.4 ms
MDC IDC MSMT LEADCHNL RV IMPEDANCE VALUE: 361 Ohm
MDC IDC MSMT LEADCHNL RV PACING THRESHOLD PULSEWIDTH: 0.4 ms
MDC IDC MSMT LEADCHNL RV SENSING INTR AMPL: 10.625 mV
MDC IDC PG IMPLANT DT: 20161115
MDC IDC STAT BRADY AP VP PERCENT: 0 %
MDC IDC STAT BRADY AS VP PERCENT: 92.17 %
MDC IDC STAT BRADY RA PERCENT PACED: 0 %

## 2016-08-06 DIAGNOSIS — M5136 Other intervertebral disc degeneration, lumbar region: Secondary | ICD-10-CM | POA: Diagnosis not present

## 2016-08-06 DIAGNOSIS — M1711 Unilateral primary osteoarthritis, right knee: Secondary | ICD-10-CM | POA: Diagnosis not present

## 2016-08-31 ENCOUNTER — Telehealth: Payer: Self-pay | Admitting: Cardiology

## 2016-08-31 ENCOUNTER — Ambulatory Visit (INDEPENDENT_AMBULATORY_CARE_PROVIDER_SITE_OTHER): Payer: PPO | Admitting: *Deleted

## 2016-08-31 DIAGNOSIS — I429 Cardiomyopathy, unspecified: Secondary | ICD-10-CM

## 2016-08-31 NOTE — Telephone Encounter (Signed)
LMOVM reminding pt to send remote transmission.   

## 2016-09-01 ENCOUNTER — Encounter: Payer: Self-pay | Admitting: Cardiology

## 2016-09-01 NOTE — Progress Notes (Signed)
Remote pacemaker transmission.   

## 2016-09-02 LAB — CUP PACEART REMOTE DEVICE CHECK
Date Time Interrogation Session: 20180228091813
Lead Channel Setting Pacing Amplitude: 2.5 V
Lead Channel Setting Pacing Pulse Width: 0.4 ms
MDC IDC PG IMPLANT DT: 20161115
MDC IDC SET LEADCHNL RV SENSING SENSITIVITY: 2.8 mV

## 2016-09-07 DIAGNOSIS — F329 Major depressive disorder, single episode, unspecified: Secondary | ICD-10-CM | POA: Diagnosis not present

## 2016-09-07 DIAGNOSIS — I10 Essential (primary) hypertension: Secondary | ICD-10-CM | POA: Diagnosis not present

## 2016-09-07 DIAGNOSIS — G894 Chronic pain syndrome: Secondary | ICD-10-CM | POA: Diagnosis not present

## 2016-09-07 DIAGNOSIS — E785 Hyperlipidemia, unspecified: Secondary | ICD-10-CM | POA: Diagnosis not present

## 2016-09-07 DIAGNOSIS — E1165 Type 2 diabetes mellitus with hyperglycemia: Secondary | ICD-10-CM | POA: Diagnosis not present

## 2016-09-07 DIAGNOSIS — M9979 Connective tissue and disc stenosis of intervertebral foramina of abdomen and other regions: Secondary | ICD-10-CM | POA: Diagnosis not present

## 2016-09-07 DIAGNOSIS — G43009 Migraine without aura, not intractable, without status migrainosus: Secondary | ICD-10-CM | POA: Diagnosis not present

## 2016-09-07 DIAGNOSIS — F17200 Nicotine dependence, unspecified, uncomplicated: Secondary | ICD-10-CM | POA: Diagnosis not present

## 2016-09-07 DIAGNOSIS — E119 Type 2 diabetes mellitus without complications: Secondary | ICD-10-CM | POA: Diagnosis not present

## 2016-09-07 DIAGNOSIS — I739 Peripheral vascular disease, unspecified: Secondary | ICD-10-CM | POA: Diagnosis not present

## 2016-09-07 DIAGNOSIS — K9189 Other postprocedural complications and disorders of digestive system: Secondary | ICD-10-CM | POA: Diagnosis not present

## 2016-09-16 NOTE — Progress Notes (Signed)
Cardiology Office Note  Date: 09/18/2016   ID: Dylan Tyler, DOB [birth year], MRN [id number]  PCP: Dylan Savage, MD  Primary Cardiologist: Dylan Lesches, MD   Chief Complaint  Patient presents with  . Atrial fibrillation/flutter    History of Present Illness: Dylan Tyler is a 71 y.o. male last seen in July 2017. He is here today with his wife for a follow-up visit. From a cardiac perspective he does not report any significant palpitations or anginal chest pain. He does have occasional atypical left-sided thoracic discomfort.  He continues to follow with Dr. Rayann Tyler in the device clinic, Medtronic pacemaker in place. Recent device interrogation showed 84% AT/AF burden. He remains on Eliquis for stroke prophylaxis.  Continues to follow with Dr. Wenda Tyler for blood work and management of glucose. It sounds like he was eating a fair amount of sweets and carbohydrates with poorly controlled blood sugars, but is trying to make some changes in his diet.  Current cardiac regimen includes Eliquis, Coreg, lisinopril, and Zocor. I personally reviewed his ECG today which shows a ventricular paced rhythm.  Past Medical History:  Diagnosis Date  . Ankylosing spondylitis ([place])   . Anxiety   . Aortic root dilatation (HCC)    Mild  . AR (aortic regurgitation)    Mild  . Atrial fibrillation and flutter (Dylan Tyler)   . Bladder cancer (Dylan Tyler)   . Chronic pain    Followed at [facility]  . DDD (degenerative disc disease), lumbosacral   . DDD (degenerative disc disease), thoracolumbar   . Degenerative cervical disc   . Depression   . Diabetes mellitus type II   . Diverticulitis   . Essential hypertension, benign   . GERD (gastroesophageal reflux disease)   . Hyperlipidemia   . Kidney stones   . Migraine   . Nonischemic cardiomyopathy (Dylan Tyler)    Cardiolite 12/09 LVEF 55%, minor luminal irregularities at cardiac catheterization  . OSA on CPAP   . Rheumatoid arthritis (Dylan Tyler)   . Symptomatic bradycardia    Medtronic Advisa L dual-chamber pacemaker  05/2015 - Dr. Curt Tyler    Past Surgical History:  Procedure Laterality Date  . CARDIAC CATHETERIZATION  08/13/11  . CARDIOVERSION N/A 07/02/2015   Procedure: CARDIOVERSION;  Surgeon: Dylan Klein, MD;  Location: [facility];  Service: Cardiovascular;  Laterality: N/A;  . CYSTOSCOPY WITH HOLMIUM LASER LITHOTRIPSY  1990's  . EP IMPLANTABLE DEVICE N/A 05/21/2015   MDT Advisa DR MRI pacemaker implanted by Dr Dylan Tyler  . KNEE ARTHROSCOPY Right ~ 1991  . LAPAROSCOPIC CHOLECYSTECTOMY  ~ 2008  . TRANSURETHRAL RESECTION OF BLADDER TUMOR WITH GYRUS (TURBT-GYRUS)  1990's?    Current Outpatient Prescriptions  Medication Sig Dispense Refill  . albuterol (PROVENTIL) (2.5 MG/3ML) 0.083% nebulizer solution Take 2.5 mg by nebulization every 6 (six) hours as needed for wheezing.    Dylan Tyler apixaban (ELIQUIS) 5 MG TABS tablet Take 1 tablet (5 mg total) by mouth 2 (two) times daily. 180 tablet 3  . carvedilol (COREG) 6.25 MG tablet Take 1 tablet (6.25 mg total) by mouth 2 (two) times daily with a meal. 180 tablet 2  . Cholecalciferol (VITAMIN D-3) 1000 UNITS CAPS Take 1,000 Units by mouth 2 (two) times daily.     . citalopram (CELEXA) 40 MG tablet Take 20 mg by mouth 2 (two) times daily.     Dylan Tyler gabapentin (NEURONTIN) 400 MG capsule Take 400 mg by mouth 3 (three) times daily.     Dylan Tyler glimepiride (AMARYL) 2 MG tablet  Take 2 mg by mouth 2 (two) times daily.  11  . ibuprofen (ADVIL,MOTRIN) 200 MG tablet Take 400 mg by mouth every 6 (six) hours as needed for headache or moderate pain.    Dylan Tyler ipratropium-albuterol (DUONEB) 0.5-2.5 (3) MG/3ML SOLN Take 3 mLs by nebulization every 4 (four) hours as needed (for shortness of breath).    Dylan Tyler lisinopril (PRINIVIL,ZESTRIL) 20 MG tablet TAKE ONE TABLET BY MOUTH DAILY. (Patient taking differently: take 1 1/2 tabs by mouth daily) 30 tablet 2  . metFORMIN (GLUCOPHAGE) 1000 MG tablet Take 1,000 mg by mouth 2 (two) times daily.    . metoCLOPramide  (REGLAN) 5 MG tablet Take 5 mg by mouth every 6 (six) hours as needed for nausea or vomiting.     . nitroGLYCERIN (NITROSTAT) 0.4 MG SL tablet Place 0.4 mg under the tongue every 5 (five) minutes as needed. May repeat for up to 3 doses.     . NON FORMULARY 1 each by Other route See admin instructions. CPAP - Uses nightly    . simvastatin (ZOCOR) 40 MG tablet Take 1 tablet (40 mg total) by mouth at bedtime. 90 tablet 3  . sitaGLIPtin (JANUVIA) 100 MG tablet Take 100 mg by mouth daily.    . tamsulosin (FLOMAX) 0.4 MG CAPS capsule Take 0.4 mg by mouth daily.    Dylan Tyler tiZANidine (ZANAFLEX) 4 MG tablet Take 4 mg by mouth 2 (two) times daily.    . vitamin B-12 (CYANOCOBALAMIN) 1000 MCG tablet Take 1,000 mcg by mouth 2 (two) times daily.      . VOLTAREN 1 % GEL Apply 2 g topically 4 (four) times daily as needed (for pain).      No current facility-administered medications for this visit.    Allergies:  Patient has no known allergies.   Social History: The patient  reports that he quit smoking about 2 years ago. His smoking use included Cigarettes. He started smoking about 57 years ago. He has a 15.00 pack-year smoking history. He has quit using smokeless tobacco. His smokeless tobacco use included Chew. He reports that he drinks about 1.2 oz of alcohol per week . He reports that he uses drugs, including Cocaine.   ROS:  Please see the history of present illness. Otherwise, complete review of systems is positive for chronic arthritic pains.  All other systems are reviewed and negative.   Physical Exam: VS:  BP 112/69   Pulse 75   Ht 5\' 9"  (1.753 m)   Wt 234 lb 9.6 oz (106.4 kg)   SpO2 97%   BMI 34.64 kg/m , BMI Body mass index is 34.64 kg/m.  Wt Readings from Last 3 Encounters:  09/18/16 234 lb 9.6 oz (106.4 kg)  01/27/16 228 lb 3.2 oz (103.5 kg)  11/08/15 237 lb (107.5 kg)    Overweight male, appears comfortable. Using a cane to walk. HEENT: Conjunctiva and lids normal, oropharynx with poor  dentition.  Neck: Supple, no elevated JVP or carotid bruits, no thyromegaly.  Lungs: Diminished breath sounds throughout, nonlabored, no wheezing.  Cardiac: RRR, no S3 gallop or rub.  Abdomen: Obese, nontender, bowel sounds present.  Skin: Warm and dry, scattered tattoos.  Extremities: 1+ edema, distal pulses one plus. Left arm in sling. Musculoskeletal: No kyphosis. Neuropsychiatric: Alert and oriented 3, affect appropriate.  ECG: I personally reviewed the tracing from  08/20/2015 which showed ventricular pacing.  Recent Labwork:  05/21/2015: BUN 13; Creatinine, Ser 1.13; Hemoglobin 13.1; Platelets 205; Potassium 4.2; Sodium 137  Other Studies Reviewed Today:  Echocardiogram 9/39/2016: Study Conclusions  - Left ventricle: The cavity size was normal. Wall thickness was increased increased in a pattern of mild to moderate LVH. Systolic function was normal. The estimated ejection fraction was in the range of 50% to 55%. Heart rhythm is coarse atrial fibrillation/flutter. Although no diagnostic regional wall motion abnormality was identified, this possibility cannot be completely excluded on the basis of this study. The study is not technically sufficient to allow evaluation of LV diastolic function. - Aortic valve: Mildly calcified annulus. Trileaflet. There was mild regurgitation. - Aortic root: The aortic root was mildly dilated. - Mitral valve: There was trivial regurgitation. - Left atrium: The atrium was mildly to moderately dilated. - Right ventricle: The cavity size was normal. - Right atrium: The atrium was mildly dilated. Central venous pressure (est): 3 mm Hg. - Atrial septum: There was a patent foramen ovale with left to right shunt by color Doppler. - Tricuspid valve: There was trivial regurgitation. - Pulmonary arteries: PA peak pressure: 23 mm Hg (S). - Pericardium, extracardiac: A trivial pericardial effusion  was identified.  Impressions:  - Mild to moderate LVH with LVEF 50-55%, indeterminate diastolic function. Mild to moderate left atrial enlargement. Mild aortic regurgitation. Mildly dilated aortic root. Trivial tricuspid regurgitation with PASP 23 mmHg. PFO with right to left shunt by color Doppler. RV contraction is normal.  Assessment and Plan:  1. History of atrial fibrillation/flutter, no active palpitations. Plan to continue current heart rate control regimen and anticoagulation for stroke prophylaxis.  2. Symptomatic bradycardia status post Medtronic pacemaker. Keep follow-up with Dr. Rayann Tyler in the device clinic.  3. History of cardiomyopathy with last LVEF 50-55% on Coreg and lisinopril.  4. Essential hypertension, blood pressure well controlled today.  Current medicines were reviewed with the patient today.   Orders Placed This Encounter  Procedures  . EKG 12-Lead    Disposition: Follow-up in 6 months.  Signed, [name], MD, [facility] 09/18/2016 4:41 PM    [facility] [place], [place], [place] [address] Phone: [phone]; Fax: [phone]

## 2016-09-17 ENCOUNTER — Encounter: Payer: Self-pay | Admitting: *Deleted

## 2016-09-17 ENCOUNTER — Other Ambulatory Visit: Payer: Self-pay | Admitting: Cardiology

## 2016-09-18 ENCOUNTER — Ambulatory Visit (INDEPENDENT_AMBULATORY_CARE_PROVIDER_SITE_OTHER): Payer: PPO | Admitting: Cardiology

## 2016-09-18 ENCOUNTER — Encounter: Payer: Self-pay | Admitting: Cardiology

## 2016-09-18 VITALS — BP 112/69 | HR 75 | Ht 69.0 in | Wt 234.6 lb

## 2016-09-18 DIAGNOSIS — R001 Bradycardia, unspecified: Secondary | ICD-10-CM | POA: Diagnosis not present

## 2016-09-18 DIAGNOSIS — I429 Cardiomyopathy, unspecified: Secondary | ICD-10-CM | POA: Diagnosis not present

## 2016-09-18 DIAGNOSIS — I481 Persistent atrial fibrillation: Secondary | ICD-10-CM | POA: Diagnosis not present

## 2016-09-18 DIAGNOSIS — I4819 Other persistent atrial fibrillation: Secondary | ICD-10-CM

## 2016-09-18 DIAGNOSIS — I10 Essential (primary) hypertension: Secondary | ICD-10-CM | POA: Diagnosis not present

## 2016-09-18 MED ORDER — NITROGLYCERIN 0.4 MG SL SUBL: 0.4000 mg | SUBLINGUAL_TABLET | SUBLINGUAL | 3 refills | Status: DC | PRN

## 2016-09-18 MED ORDER — SIMVASTATIN 40 MG PO TABS: 40.0000 mg | ORAL_TABLET | Freq: Every day | ORAL | 3 refills | Status: DC

## 2016-09-18 NOTE — Patient Instructions (Signed)

## 2016-09-22 ENCOUNTER — Other Ambulatory Visit (HOSPITAL_COMMUNITY): Payer: PPO

## 2016-09-22 ENCOUNTER — Other Ambulatory Visit: Payer: Self-pay | Admitting: Physician Assistant

## 2016-10-27 ENCOUNTER — Ambulatory Visit (INDEPENDENT_AMBULATORY_CARE_PROVIDER_SITE_OTHER): Payer: PPO | Admitting: Internal Medicine

## 2016-10-27 ENCOUNTER — Encounter: Payer: Self-pay | Admitting: Internal Medicine

## 2016-10-27 VITALS — BP 104/67 | HR 75 | Ht 69.0 in | Wt 236.0 lb

## 2016-10-27 DIAGNOSIS — I10 Essential (primary) hypertension: Secondary | ICD-10-CM | POA: Diagnosis not present

## 2016-10-27 DIAGNOSIS — I482 Chronic atrial fibrillation, unspecified: Secondary | ICD-10-CM

## 2016-10-27 DIAGNOSIS — R001 Bradycardia, unspecified: Secondary | ICD-10-CM | POA: Diagnosis not present

## 2016-10-27 NOTE — Progress Notes (Signed)
PCP: [name], MD Primary Cardiologist:  Dr Max Sane Dylan Tyler is a 71 y.o. male who presents today for routine electrophysiology followup.  He has longstanding persistent afib.  He has fatigue but otherwise is well.  S/p PPM by Dr Curt Bears for symptomatic bradycardia. + chronic SOB and edema.  Not very active and limited by back issues.  No changes since his last visit.  Tolerating anticoagulation without difficulty.  Today, he denies symptoms of palpitations, chest pain,dizziness, presyncope, or syncope.  The patient is otherwise without complaint today.   Past Medical History:  Diagnosis Date  . Ankylosing spondylitis ([place])   . Anxiety   . Aortic root dilatation (HCC)    Mild  . AR (aortic regurgitation)    Mild  . Atrial fibrillation and flutter ([place])   . Bladder cancer ([place])   . Chronic pain    Followed at [facility]  . DDD (degenerative disc disease), lumbosacral   . DDD (degenerative disc disease), thoracolumbar   . Degenerative cervical disc   . Depression   . Diabetes mellitus type II   . Diverticulitis   . Essential hypertension, benign   . GERD (gastroesophageal reflux disease)   . Hyperlipidemia   . Kidney stones   . Migraine   . Nonischemic cardiomyopathy ([place])    Cardiolite 12/09 LVEF 55%, minor luminal irregularities at cardiac catheterization  . OSA on CPAP   . Rheumatoid arthritis ([place])   . Symptomatic bradycardia    Medtronic Advisa L dual-chamber pacemaker  05/2015 - Dr. Curt Bears   Past Surgical History:  Procedure Laterality Date  . CARDIAC CATHETERIZATION  08/13/11  . CARDIOVERSION N/A 07/02/2015   Procedure: CARDIOVERSION;  Surgeon: Sanda Klein, MD;  Location: [facility] END[facility];  Service: Cardiovascular;  Laterality: N/A;  . CYSTOSCOPY WITH HOLMIUM LASER LITHOTRIPSY  1990's  . EP IMPLANTABLE DEVICE N/A 05/21/2015   MDT Advisa DR MRI pacemaker implanted by Dr Curt Bears  . KNEE ARTHROSCOPY Right ~ 1991  . LAPAROSCOPIC CHOLECYSTECTOMY  ~ 2008  .  TRANSURETHRAL RESECTION OF BLADDER TUMOR WITH GYRUS (TURBT-GYRUS)  1990's?    ROS- all systems are reviewed and negative except as per HPI above  Current Outpatient Prescriptions  Medication Sig Dispense Refill  . albuterol (PROVENTIL) (2.5 MG/3ML) 0.083% nebulizer solution Take 2.5 mg by nebulization every 6 (six) hours as needed for wheezing.    Marland Kitchen apixaban (ELIQUIS) 5 MG TABS tablet Take 1 tablet (5 mg total) by mouth 2 (two) times daily. 180 tablet 3  . carvedilol (COREG) 6.25 MG tablet Take 1 tablet (6.25 mg total) by mouth 2 (two) times daily with a meal. 180 tablet 2  . Cholecalciferol (VITAMIN D-3) 1000 UNITS CAPS Take 1,000 Units by mouth 2 (two) times daily.     . citalopram (CELEXA) 40 MG tablet Take 20-40 mg by mouth daily.     Marland Kitchen gabapentin (NEURONTIN) 400 MG capsule Take 400 mg by mouth 3 (three) times daily.     Marland Kitchen glimepiride (AMARYL) 2 MG tablet Take 2 mg by mouth 2 (two) times daily.  11  . ibuprofen (ADVIL,MOTRIN) 200 MG tablet Take 400 mg by mouth every 6 (six) hours as needed for headache or moderate pain.    Marland Kitchen ipratropium-albuterol (DUONEB) 0.5-2.5 (3) MG/3ML SOLN Take 3 mLs by nebulization every 4 (four) hours as needed (for shortness of breath).    Marland Kitchen lisinopril (PRINIVIL,ZESTRIL) 20 MG tablet TAKE ONE TABLET BY MOUTH DAILY. (Patient taking differently: take 1 1/2 tabs by  mouth daily) 30 tablet 2  . metFORMIN (GLUCOPHAGE) 1000 MG tablet Take 1,000 mg by mouth 2 (two) times daily.    . metoCLOPramide (REGLAN) 5 MG tablet Take 5 mg by mouth every 6 (six) hours as needed for nausea or vomiting.     . nitroGLYCERIN (NITROSTAT) 0.4 MG SL tablet Place 1 tablet (0.4 mg total) under the tongue every 5 (five) minutes as needed for chest pain (up to 3 doses. If no relief after the 3 rd dose, proceed to the ED for an evaluation). May repeat for up to 3 doses. 25 tablet 3  . NON FORMULARY 1 each by Other route See admin instructions. CPAP - Uses nightly    . simvastatin (ZOCOR) 40 MG  tablet Take 1 tablet (40 mg total) by mouth at bedtime. 90 tablet 3  . sitaGLIPtin (JANUVIA) 100 MG tablet Take 100 mg by mouth daily.    . tamsulosin (FLOMAX) 0.4 MG CAPS capsule Take 0.4 mg by mouth daily.    Marland Kitchen tiZANidine (ZANAFLEX) 4 MG tablet Take 4-8 mg by mouth every 8 (eight) hours as needed.     . vitamin B-12 (CYANOCOBALAMIN) 1000 MCG tablet Take 1,000 mcg by mouth 2 (two) times daily.      . VOLTAREN 1 % GEL Apply 2 g topically 4 (four) times daily as needed (for pain).      No current facility-administered medications for this visit.     Physical Exam: Vitals:   10/27/16 1136  BP: 104/67  Pulse: 75  SpO2: 98%  Weight: 236 lb (107 kg)  Height: 5\' 9"  (1.753 m)    GEN- The patient is well appearing, alert and oriented x 3 today.  Walks slowly with a cane Head- normocephalic, atraumatic Eyes-  Sclera clear, conjunctiva pink Ears- hearing intact Oropharynx- clear Lungs- Clear to ausculation bilaterally, normal work of breathing Chest- pacemaker pocket is well healed Heart- Regular rate and rhythm (paced) GI- soft, NT, ND, + BS Extremities- no clubbing, cyanosis, + dependant edema  Pacemaker interrogation- personally reviewed in detail today,  See PACEART report  Assessment and Plan:  1. Symptomatic Bradycardia Normal pacemaker function See Pace Art report  2. Long standing persistent afib Rate control going forward Continue long term anticoagualtion  3. Obesity Weight loss advised Body mass index is 34.85 kg/m.  4. HTN Stable No change required today  carelink Return to see me in 1 year Follow-up with Dr Domenic Polite as scheduled  Thompson Grayer MD, [facility] 10/27/2016 11:55 AM

## 2016-10-27 NOTE — Patient Instructions (Signed)
Medication Instructions:  Continue all current medications.  Labwork: none  Testing/Procedures: none  Follow-Up: Your physician wants you to follow up in:  1 year.  You will receive a reminder letter in the mail one-two months in advance.  If you don't receive a letter, please call our office to schedule the follow up appointment - Dr. [name].   Any Other Special Instructions Will Be Listed Below (If Applicable). Remote monitoring is used to monitor your Pacemaker of ICD from home. This monitoring reduces the number of office visits required to check your device to one time per year. It allows [place] to keep an eye on the functioning of your device to ensure it is working properly. You are scheduled for a device check from home on 01/26/2017. You may send your transmission at any time that day. If you have a wireless device, the transmission will be sent automatically. After your physician reviews your transmission, you will receive a postcard with your next transmission date.  If you need a refill on your cardiac medications before your next appointment, please call your pharmacy.

## 2016-10-29 LAB — CUP PACEART INCLINIC DEVICE CHECK
Date Time Interrogation Session: 20180426150410
Lead Channel Impedance Value: 437 Ohm
Lead Channel Pacing Threshold Amplitude: 0.75 V
Lead Channel Sensing Intrinsic Amplitude: 1.4 mV
MDC IDC MSMT LEADCHNL RA IMPEDANCE VALUE: 456 Ohm
MDC IDC MSMT LEADCHNL RV PACING THRESHOLD PULSEWIDTH: 0.4 ms
MDC IDC PG IMPLANT DT: 20161115
MDC IDC STAT BRADY AS VP PERCENT: 92.5 %
MDC IDC STAT BRADY AS VS PERCENT: 7.5 %

## 2016-11-05 ENCOUNTER — Other Ambulatory Visit: Payer: Self-pay | Admitting: Cardiology

## 2016-11-06 ENCOUNTER — Encounter: Payer: Medicare HMO | Admitting: Internal Medicine

## 2016-12-31 ENCOUNTER — Other Ambulatory Visit: Payer: Self-pay

## 2016-12-31 MED ORDER — APIXABAN 5 MG PO TABS: 5.0000 mg | ORAL_TABLET | Freq: Two times a day (BID) | ORAL | 3 refills | Status: DC

## 2017-01-12 ENCOUNTER — Other Ambulatory Visit: Payer: Self-pay

## 2017-01-12 DIAGNOSIS — N400 Enlarged prostate without lower urinary tract symptoms: Secondary | ICD-10-CM | POA: Diagnosis not present

## 2017-01-12 DIAGNOSIS — F329 Major depressive disorder, single episode, unspecified: Secondary | ICD-10-CM | POA: Diagnosis not present

## 2017-01-12 DIAGNOSIS — Z299 Encounter for prophylactic measures, unspecified: Secondary | ICD-10-CM | POA: Diagnosis not present

## 2017-01-12 DIAGNOSIS — I10 Essential (primary) hypertension: Secondary | ICD-10-CM | POA: Diagnosis not present

## 2017-01-12 DIAGNOSIS — E1165 Type 2 diabetes mellitus with hyperglycemia: Secondary | ICD-10-CM | POA: Diagnosis not present

## 2017-01-12 DIAGNOSIS — I4891 Unspecified atrial fibrillation: Secondary | ICD-10-CM | POA: Diagnosis not present

## 2017-01-12 DIAGNOSIS — Z6835 Body mass index (BMI) 35.0-35.9, adult: Secondary | ICD-10-CM | POA: Diagnosis not present

## 2017-01-12 DIAGNOSIS — I2510 Atherosclerotic heart disease of native coronary artery without angina pectoris: Secondary | ICD-10-CM | POA: Diagnosis not present

## 2017-01-12 DIAGNOSIS — J449 Chronic obstructive pulmonary disease, unspecified: Secondary | ICD-10-CM | POA: Diagnosis not present

## 2017-01-12 DIAGNOSIS — E785 Hyperlipidemia, unspecified: Secondary | ICD-10-CM | POA: Diagnosis not present

## 2017-01-12 LAB — HEMOGLOBIN A1C: Hemoglobin A1C: 9.8

## 2017-01-12 MED ORDER — APIXABAN 5 MG PO TABS
5.0000 mg | ORAL_TABLET | Freq: Two times a day (BID) | ORAL | 0 refills | Status: DC
Start: 2017-01-12 — End: 2017-01-26

## 2017-01-19 ENCOUNTER — Other Ambulatory Visit: Payer: Self-pay | Admitting: Cardiology

## 2017-01-26 ENCOUNTER — Telehealth: Payer: Self-pay | Admitting: Cardiology

## 2017-01-26 ENCOUNTER — Ambulatory Visit (INDEPENDENT_AMBULATORY_CARE_PROVIDER_SITE_OTHER): Payer: PPO | Admitting: *Deleted

## 2017-01-26 DIAGNOSIS — R001 Bradycardia, unspecified: Secondary | ICD-10-CM

## 2017-01-26 MED ORDER — APIXABAN 5 MG PO TABS: 5.0000 mg | ORAL_TABLET | Freq: Two times a day (BID) | ORAL | 0 refills | Status: DC

## 2017-01-26 NOTE — Telephone Encounter (Signed)
Spoke with pt and reminded pt of remote transmission that is due today. Pt verbalized understanding.   

## 2017-01-26 NOTE — Telephone Encounter (Signed)
Called needing Eliquis samples  Also asked if application was ever sent off and if so if we have heard anything.

## 2017-01-26 NOTE — Telephone Encounter (Signed)
Patient informed that samples available for pick up.

## 2017-01-27 NOTE — Progress Notes (Signed)
Remote pacemaker transmission.   

## 2017-01-28 ENCOUNTER — Encounter: Payer: Self-pay | Admitting: Cardiology

## 2017-02-09 DIAGNOSIS — I2510 Atherosclerotic heart disease of native coronary artery without angina pectoris: Secondary | ICD-10-CM | POA: Diagnosis not present

## 2017-02-09 DIAGNOSIS — E785 Hyperlipidemia, unspecified: Secondary | ICD-10-CM | POA: Diagnosis not present

## 2017-02-09 DIAGNOSIS — I10 Essential (primary) hypertension: Secondary | ICD-10-CM | POA: Diagnosis not present

## 2017-02-09 DIAGNOSIS — I739 Peripheral vascular disease, unspecified: Secondary | ICD-10-CM | POA: Diagnosis not present

## 2017-02-09 DIAGNOSIS — Z299 Encounter for prophylactic measures, unspecified: Secondary | ICD-10-CM | POA: Diagnosis not present

## 2017-02-09 DIAGNOSIS — M459 Ankylosing spondylitis of unspecified sites in spine: Secondary | ICD-10-CM | POA: Diagnosis not present

## 2017-02-09 DIAGNOSIS — F329 Major depressive disorder, single episode, unspecified: Secondary | ICD-10-CM | POA: Diagnosis not present

## 2017-02-09 DIAGNOSIS — N400 Enlarged prostate without lower urinary tract symptoms: Secondary | ICD-10-CM | POA: Diagnosis not present

## 2017-02-09 DIAGNOSIS — I4891 Unspecified atrial fibrillation: Secondary | ICD-10-CM | POA: Diagnosis not present

## 2017-02-09 DIAGNOSIS — Z6835 Body mass index (BMI) 35.0-35.9, adult: Secondary | ICD-10-CM | POA: Diagnosis not present

## 2017-02-09 DIAGNOSIS — J449 Chronic obstructive pulmonary disease, unspecified: Secondary | ICD-10-CM | POA: Diagnosis not present

## 2017-02-09 DIAGNOSIS — E1165 Type 2 diabetes mellitus with hyperglycemia: Secondary | ICD-10-CM | POA: Diagnosis not present

## 2017-02-16 DIAGNOSIS — E119 Type 2 diabetes mellitus without complications: Secondary | ICD-10-CM | POA: Diagnosis not present

## 2017-02-23 LAB — CUP PACEART REMOTE DEVICE CHECK
Brady Statistic AS VP Percent: 94.59 %
Brady Statistic AS VS Percent: 5.41 %
Brady Statistic RA Percent Paced: 0 %
Date Time Interrogation Session: 20180724210029
Implantable Pulse Generator Implant Date: 20161115
Lead Channel Impedance Value: 361 Ohm
Lead Channel Impedance Value: 494 Ohm
Lead Channel Pacing Threshold Amplitude: 0.625 V
Lead Channel Pacing Threshold Pulse Width: 0.4 ms
Lead Channel Sensing Intrinsic Amplitude: 15.625 mV
Lead Channel Sensing Intrinsic Amplitude: 15.625 mV
Lead Channel Setting Pacing Amplitude: 2.5 V
Lead Channel Setting Pacing Pulse Width: 0.4 ms
MDC IDC MSMT BATTERY REMAINING LONGEVITY: 70 mo
MDC IDC MSMT BATTERY VOLTAGE: 3 V
MDC IDC MSMT LEADCHNL RA PACING THRESHOLD AMPLITUDE: 0.5 V
MDC IDC MSMT LEADCHNL RA PACING THRESHOLD PULSEWIDTH: 0.4 ms
MDC IDC MSMT LEADCHNL RA SENSING INTR AMPL: 1.375 mV
MDC IDC MSMT LEADCHNL RA SENSING INTR AMPL: 1.375 mV
MDC IDC MSMT LEADCHNL RV IMPEDANCE VALUE: 380 Ohm
MDC IDC MSMT LEADCHNL RV IMPEDANCE VALUE: 456 Ohm
MDC IDC SET LEADCHNL RV SENSING SENSITIVITY: 2.8 mV
MDC IDC STAT BRADY AP VP PERCENT: 0 %
MDC IDC STAT BRADY AP VS PERCENT: 0 %
MDC IDC STAT BRADY RV PERCENT PACED: 96.5 %

## 2017-03-04 DIAGNOSIS — E119 Type 2 diabetes mellitus without complications: Secondary | ICD-10-CM | POA: Diagnosis not present

## 2017-03-09 DIAGNOSIS — I10 Essential (primary) hypertension: Secondary | ICD-10-CM | POA: Diagnosis not present

## 2017-03-09 DIAGNOSIS — Z299 Encounter for prophylactic measures, unspecified: Secondary | ICD-10-CM | POA: Diagnosis not present

## 2017-03-09 DIAGNOSIS — G4733 Obstructive sleep apnea (adult) (pediatric): Secondary | ICD-10-CM | POA: Diagnosis not present

## 2017-03-09 DIAGNOSIS — E1165 Type 2 diabetes mellitus with hyperglycemia: Secondary | ICD-10-CM | POA: Diagnosis not present

## 2017-03-09 DIAGNOSIS — Z6834 Body mass index (BMI) 34.0-34.9, adult: Secondary | ICD-10-CM | POA: Diagnosis not present

## 2017-03-09 DIAGNOSIS — E1140 Type 2 diabetes mellitus with diabetic neuropathy, unspecified: Secondary | ICD-10-CM | POA: Diagnosis not present

## 2017-03-09 DIAGNOSIS — I4891 Unspecified atrial fibrillation: Secondary | ICD-10-CM | POA: Diagnosis not present

## 2017-03-09 DIAGNOSIS — M459 Ankylosing spondylitis of unspecified sites in spine: Secondary | ICD-10-CM | POA: Diagnosis not present

## 2017-03-09 DIAGNOSIS — I2510 Atherosclerotic heart disease of native coronary artery without angina pectoris: Secondary | ICD-10-CM | POA: Diagnosis not present

## 2017-03-09 DIAGNOSIS — J449 Chronic obstructive pulmonary disease, unspecified: Secondary | ICD-10-CM | POA: Diagnosis not present

## 2017-03-09 DIAGNOSIS — F419 Anxiety disorder, unspecified: Secondary | ICD-10-CM | POA: Diagnosis not present

## 2017-03-22 DIAGNOSIS — Z6835 Body mass index (BMI) 35.0-35.9, adult: Secondary | ICD-10-CM | POA: Diagnosis not present

## 2017-03-22 DIAGNOSIS — Z299 Encounter for prophylactic measures, unspecified: Secondary | ICD-10-CM | POA: Diagnosis not present

## 2017-03-22 DIAGNOSIS — I10 Essential (primary) hypertension: Secondary | ICD-10-CM | POA: Diagnosis not present

## 2017-03-22 DIAGNOSIS — T63461A Toxic effect of venom of wasps, accidental (unintentional), initial encounter: Secondary | ICD-10-CM | POA: Diagnosis not present

## 2017-03-22 DIAGNOSIS — J449 Chronic obstructive pulmonary disease, unspecified: Secondary | ICD-10-CM | POA: Diagnosis not present

## 2017-03-22 DIAGNOSIS — I4891 Unspecified atrial fibrillation: Secondary | ICD-10-CM | POA: Diagnosis not present

## 2017-03-24 ENCOUNTER — Ambulatory Visit (INDEPENDENT_AMBULATORY_CARE_PROVIDER_SITE_OTHER): Payer: PPO | Admitting: "Endocrinology

## 2017-03-24 ENCOUNTER — Encounter: Payer: Self-pay | Admitting: "Endocrinology

## 2017-03-24 VITALS — BP 137/73 | HR 90 | Ht 69.0 in | Wt 232.0 lb

## 2017-03-24 DIAGNOSIS — I10 Essential (primary) hypertension: Secondary | ICD-10-CM | POA: Diagnosis not present

## 2017-03-24 DIAGNOSIS — E782 Mixed hyperlipidemia: Secondary | ICD-10-CM | POA: Diagnosis not present

## 2017-03-24 DIAGNOSIS — E1165 Type 2 diabetes mellitus with hyperglycemia: Secondary | ICD-10-CM

## 2017-03-24 DIAGNOSIS — N182 Chronic kidney disease, stage 2 (mild): Secondary | ICD-10-CM | POA: Diagnosis not present

## 2017-03-24 DIAGNOSIS — E6609 Other obesity due to excess calories: Secondary | ICD-10-CM | POA: Insufficient documentation

## 2017-03-24 DIAGNOSIS — Z6834 Body mass index (BMI) 34.0-34.9, adult: Secondary | ICD-10-CM | POA: Diagnosis not present

## 2017-03-24 DIAGNOSIS — IMO0002 Reserved for concepts with insufficient information to code with codable children: Secondary | ICD-10-CM | POA: Insufficient documentation

## 2017-03-24 DIAGNOSIS — E1122 Type 2 diabetes mellitus with diabetic chronic kidney disease: Secondary | ICD-10-CM | POA: Diagnosis not present

## 2017-03-24 DIAGNOSIS — E66812 Obesity, class 2: Secondary | ICD-10-CM | POA: Insufficient documentation

## 2017-03-24 DIAGNOSIS — N183 Chronic kidney disease, stage 3 (moderate): Secondary | ICD-10-CM

## 2017-03-24 NOTE — Patient Instructions (Signed)

## 2017-03-24 NOTE — Progress Notes (Signed)
Subjective:    Patient ID: Dylan Tyler, male    DOB: [birth year].  he is being seen in consultation for management of currently uncontrolled symptomatic diabetes requested by  Glenda Chroman, MD.   Past Medical History:  Diagnosis Date  . Ankylosing spondylitis ([place])   . Anxiety   . Aortic root dilatation (HCC)    Mild  . AR (aortic regurgitation)    Mild  . Atrial fibrillation and flutter ([place])   . Bladder cancer ([place])   . Chronic pain    Followed at [facility]  . DDD (degenerative disc disease), lumbosacral   . DDD (degenerative disc disease), thoracolumbar   . Degenerative cervical disc   . Depression   . Diabetes mellitus type II   . Diverticulitis   . Essential hypertension, benign   . GERD (gastroesophageal reflux disease)   . Hyperlipidemia   . Kidney stones   . Migraine   . Nonischemic cardiomyopathy ([place])    Cardiolite 12/09 LVEF 55%, minor luminal irregularities at cardiac catheterization  . OSA on CPAP   . Rheumatoid arthritis ([place])   . Symptomatic bradycardia    Medtronic Advisa L dual-chamber pacemaker  05/2015 - Dr. Curt Bears   Past Surgical History:  Procedure Laterality Date  . CARDIAC CATHETERIZATION  08/13/11  . CARDIOVERSION N/A 07/02/2015   Procedure: CARDIOVERSION;  Surgeon: Sanda Klein, MD;  Location: [facility];  Service: Cardiovascular;  Laterality: N/A;  . CYSTOSCOPY WITH HOLMIUM LASER LITHOTRIPSY  1990's  . EP IMPLANTABLE DEVICE N/A 05/21/2015   MDT Advisa DR MRI pacemaker implanted by Dr Curt Bears  . KNEE ARTHROSCOPY Right ~ 1991  . LAPAROSCOPIC CHOLECYSTECTOMY  ~ 2008  . TRANSURETHRAL RESECTION OF BLADDER TUMOR WITH GYRUS (TURBT-GYRUS)  1990's?   Social History   Social History  . Marital status: Divorced    Spouse name: N/A  . Number of children: N/A  . Years of education: N/A   Occupational History  . Disabled Disabled   Social History Main Topics  . Smoking status: Former Smoker    Packs/day: 0.30    Years: 50.00    Types:  Cigarettes    Start date: 02/26/1959    Quit date: 12/06/2013  . Smokeless tobacco: Former Systems developer    Types: Chew     Comment: 05/21/2015 "quit chewing in 1992"  . Alcohol use 1.2 oz/week    2 Cans of beer per week  . Drug use: Yes    Types: Cocaine     Comment: 05/21/2015 "quit in the early 2000"  . Sexual activity: Not Asked   Other Topics Concern  . None   Social History Narrative   Divorced   No regular exercise   Outpatient Encounter Prescriptions as of 03/24/2017  Medication Sig  . cholestyramine (QUESTRAN) 4 g packet Take 4 g by mouth 3 (three) times daily with meals.  . methylPREDNISolone (MEDROL DOSEPAK) 4 MG TBPK tablet Take by mouth.  Marland Kitchen albuterol (PROVENTIL) (2.5 MG/3ML) 0.083% nebulizer solution Take 2.5 mg by nebulization every 6 (six) hours as needed for wheezing.  Marland Kitchen apixaban (ELIQUIS) 5 MG TABS tablet Take 1 tablet (5 mg total) by mouth 2 (two) times daily.  . carvedilol (COREG) 6.25 MG tablet Take 1 tablet (6.25 mg total) by mouth 2 (two) times daily with a meal.  . Cholecalciferol (VITAMIN D-3) 1000 UNITS CAPS Take 1,000 Units by mouth 2 (two) times daily.   . citalopram (CELEXA) 40 MG tablet Take 20-40 mg by mouth daily.   Marland Kitchen  gabapentin (NEURONTIN) 400 MG capsule Take 400 mg by mouth 3 (three) times daily.   Marland Kitchen ibuprofen (ADVIL,MOTRIN) 200 MG tablet Take 400 mg by mouth every 6 (six) hours as needed for headache or moderate pain.  Marland Kitchen ipratropium-albuterol (DUONEB) 0.5-2.5 (3) MG/3ML SOLN Take 3 mLs by nebulization every 4 (four) hours as needed (for shortness of breath).  Marland Kitchen lisinopril (PRINIVIL,ZESTRIL) 20 MG tablet TAKE ONE TABLET BY MOUTH DAILY. (Patient taking differently: take 1 1/2 tabs by mouth daily)  . metFORMIN (GLUCOPHAGE) 1000 MG tablet Take 500 mg by mouth 2 (two) times daily.  . metoCLOPramide (REGLAN) 5 MG tablet Take 5 mg by mouth every 6 (six) hours as needed for nausea or vomiting.   . nitroGLYCERIN (NITROSTAT) 0.4 MG SL tablet Place 1 tablet (0.4 mg total)  under the tongue every 5 (five) minutes as needed for chest pain (up to 3 doses. If no relief after the 3 rd dose, proceed to the ED for an evaluation). May repeat for up to 3 doses.  . NON FORMULARY 1 each by Other route See admin instructions. CPAP - Uses nightly  . simvastatin (ZOCOR) 40 MG tablet Take 1 tablet (40 mg total) by mouth at bedtime.  . tamsulosin (FLOMAX) 0.4 MG CAPS capsule Take 0.4 mg by mouth daily.  Marland Kitchen tiZANidine (ZANAFLEX) 4 MG tablet Take 4-8 mg by mouth every 8 (eight) hours as needed.   . vitamin B-12 (CYANOCOBALAMIN) 1000 MCG tablet Take 1,000 mcg by mouth 2 (two) times daily.    . VOLTAREN 1 % GEL Apply 2 g topically 4 (four) times daily as needed (for pain).   . [DISCONTINUED] carvedilol (COREG) 6.25 MG tablet TAKE ONE TABLET BY MOUTH TWICE DAILY WITH A MEAL.  . [DISCONTINUED] glimepiride (AMARYL) 2 MG tablet Take 2 mg by mouth 2 (two) times daily.  . [DISCONTINUED] sitaGLIPtin (JANUVIA) 100 MG tablet Take 50 mg by mouth daily.   No facility-administered encounter medications on file as of 03/24/2017.     ALLERGIES: No Known Allergies  VACCINATION STATUS:  There is no immunization history on file for this patient.  Diabetes  He presents for his initial diabetic visit. He has type 2 diabetes mellitus. Onset time: He was diagnosed at approximate age of 18 years. His disease course has been worsening (His most recent A1c was 9.8% on 01/12/2017.). There are no hypoglycemic associated symptoms. Pertinent negatives for hypoglycemia include no confusion, headaches, pallor or seizures. Associated symptoms include foot paresthesias, polydipsia and polyuria. Pertinent negatives for diabetes include no chest pain, no fatigue, no polyphagia and no weakness. There are no hypoglycemic complications. Symptoms are worsening. Diabetic complications include peripheral neuropathy. Risk factors for coronary artery disease include dyslipidemia, diabetes mellitus, hypertension, male sex,  sedentary lifestyle, tobacco exposure and family history. Current diabetic treatment includes oral agent (dual therapy). Compliance with diabetes treatment: She admits to dietary indiscretion consuming large quantities of processed colitis including soda, however he promises to change that. His weight is stable. He is following a generally unhealthy diet. When asked about meal planning, he reported none. He has not had a previous visit with a dietitian. He never (He is wheelchair-bound due to deconditioning, and due todegenerative joint disease.) participates in exercise. His breakfast blood glucose range is generally >200 mg/dl. His dinner blood glucose range is generally >200 mg/dl. His overall blood glucose range is >200 mg/dl. An ACE inhibitor/angiotensin II receptor blocker is being taken. He sees a podiatrist. Hyperlipidemia  This is a chronic problem. The  current episode started more than 1 year ago. Exacerbating diseases include diabetes and obesity. Factors aggravating his hyperlipidemia include smoking. Pertinent negatives include no chest pain, myalgias or shortness of breath. Current antihyperlipidemic treatment includes statins. Risk factors for coronary artery disease include dyslipidemia, diabetes mellitus, hypertension, a sedentary lifestyle, male sex and obesity.  Hypertension  This is a chronic problem. The current episode started more than 1 month ago. Pertinent negatives include no chest pain, headaches, neck pain, palpitations or shortness of breath. Risk factors for coronary artery disease include diabetes mellitus, dyslipidemia, male gender, obesity, sedentary lifestyle and smoking/tobacco exposure. Past treatments include ACE inhibitors.       Review of Systems  Constitutional: Negative for chills, fatigue, fever and unexpected weight change.  HENT: Negative for dental problem, mouth sores and trouble swallowing.   Eyes: Negative for visual disturbance.  Respiratory: Negative  for cough, choking, chest tightness, shortness of breath and wheezing.   Cardiovascular: Negative for chest pain, palpitations and leg swelling.  Gastrointestinal: Negative for abdominal distention, abdominal pain, constipation, diarrhea, nausea and vomiting.  Endocrine: Positive for polydipsia and polyuria. Negative for polyphagia.  Genitourinary: Negative for dysuria, flank pain, hematuria and urgency.  Musculoskeletal: Positive for gait problem. Negative for back pain, myalgias and neck pain.       Walks with a walker.  Skin: Negative for pallor, rash and wound.  Neurological: Negative for seizures, syncope, weakness, numbness and headaches.  Psychiatric/Behavioral: Negative.  Negative for confusion and dysphoric mood.    Objective:    BP 137/73   Pulse 90   Ht 5\' 9"  (1.753 m)   Wt 232 lb (105.2 kg)   BMI 34.26 kg/m   Wt Readings from Last 3 Encounters:  03/24/17 232 lb (105.2 kg)  10/27/16 236 lb (107 kg)  09/18/16 234 lb 9.6 oz (106.4 kg)     Physical Exam  Constitutional: He is oriented to person, place, and time. He appears well-developed and well-nourished. He is cooperative. No distress.  HENT:  Head: Normocephalic and atraumatic.  Eyes: EOM are normal.  Neck: Normal range of motion. Neck supple. No tracheal deviation present. No thyromegaly present.  Cardiovascular: Normal rate, S1 normal, S2 normal and normal heart sounds.  Exam reveals no gallop.   No murmur heard. Pulses:      Dorsalis pedis pulses are 1+ on the right side, and 1+ on the left side.       Posterior tibial pulses are 1+ on the right side, and 1+ on the left side.  Pulmonary/Chest: Breath sounds normal. No respiratory distress. He has no wheezes.  Abdominal: Soft. Bowel sounds are normal. He exhibits no distension. There is no tenderness. There is no guarding and no CVA tenderness.  Musculoskeletal: He exhibits no edema.       Right shoulder: He exhibits no swelling and no deformity.  Uses walker to  get around.  Neurological: He is alert and oriented to person, place, and time. He has normal strength and normal reflexes. No cranial nerve deficit or sensory deficit. Gait normal.  Skin: Skin is warm and dry. No rash noted. No cyanosis. Nails show no clubbing.  Psychiatric: He has a normal mood and affect. His speech is normal. Cognition and memory are normal.    CMP     Component Value Date/Time   NA 137 05/21/2015 0848   K 4.2 05/21/2015 0848   CL 103 05/21/2015 0848   CO2 22 05/21/2015 0848   GLUCOSE 162 (H) 05/21/2015 0848  BUN 13 05/21/2015 0848   CREATININE 1.13 05/21/2015 0848   CREATININE 1.29 (H) 04/12/2015 1609   CALCIUM 9.6 05/21/2015 0848   GFRNONAA >60 05/21/2015 0848   GFRAA >60 05/21/2015 0848   A1c from 01/07/2017 was 9.8%.     Assessment & Plan:   1. Type 2 diabetes mellitus with stage 2 chronic kidney disease, without long-term current use of insulin ([place])  - Patient has currently uncontrolled symptomatic type 2 DM since  71 years of age,  with most recent A1c of 9.8 %. Recent labs reviewed.  -his diabetes is complicated by stage 2 renal insufficiency, obesity/sedentary life, neuropathy, cardiomyopathy and SLAYDEN MENNENGA remains at a high risk for more acute and chronic complications which include CAD, CVA, CKD, retinopathy, and neuropathy. These are all discussed in detail with the patient.  - I have counseled him on diet management and weight loss, by adopting a carbohydrate restricted/protein rich diet.  - Suggestion is made for him to avoid simple carbohydrates  from his diet including Cakes, Sweet Desserts, Ice Cream, Soda (diet and regular), Sweet Tea, Candies, Chips, Cookies, Store Bought Juices, Alcohol in Excess of  1-2 drinks a day, Artificial Sweeteners, and "Sugar-free" Products. This will help patient to have stable blood glucose profile and potentially avoid unintended weight gain.  - I encouraged him to switch to  unprocessed or minimally  processed complex starch and increased protein intake (animal or plant source), fruits, and vegetables.  - he is advised to stick to a routine mealtimes to eat 3 meals  a day and avoid unnecessary snacks ( to snack only to correct hypoglycemia).   - he will be scheduled with Jearld Fenton, RDN, CDE for individualized DM education.  - I have approached him with the following individualized plan to manage diabetes and patient agrees:   - He will likely require insulin treatment to achieve control of diabetes to target. - However, it is essential to assure his commitment for proper monitoring for safe use of insulin. - I advised him to initiate trict monitoring of glucose 4 times a day-before meals and at bedtime, and return in one week with his meter and logs.  -Patient is encouraged to call clinic for blood glucose levels less than 70 or above 300 mg /dl. - I will lower his metformin to 500 mg by mouth twice a day due to CK D, therapeutically suitable for patient . - I will discontinue  farxiga, risk outweighs benefit for this patient. - I have advised him to discontinue glimepiride.  - he will be considered for incretin therapy as appropriate next visit. - Patient specific target  A1c;  LDL, HDL, Triglycerides, and  Waist Circumference were discussed in detail.  2) BP/HTN: Controlled. Continue current medications including ACEI/ARB. 3) Lipids/HPL:   Lipid panel unknown.   Patient is advised to continue statins. 4)  Weight/Diet: CDE Consult will be initiated , exercise, and detailed carbohydrates information provided.  5) Chronic Care/Health Maintenance:  -he  is on ACEI/ARB and Statin medications and  is encouraged to continue to follow up with Ophthalmology, Dentist,  Podiatrist at least yearly or according to recommendations, and advised to  stay away from smoking. I have recommended yearly flu vaccine and pneumonia vaccination at least every 5 years; moderate intensity exercise for up  to 150 minutes weekly; and  sleep for at least 7 hours a day.  - Time spent with the patient: 1 hour, of which >50% was spent in obtaining information  about his symptoms, reviewing his previous labs, evaluations, and treatments, counseling him about his  currently uncontrolled, complicated type 2 diabetes, hypertension, hyperlipidemia, and developing a plan for long term treatment; he had a number of questions which I addressed.  - Patient to bring meter and  blood glucose logs during his next visit.  - I advised patient to maintain close follow up with Glenda Chroman, MD for primary care needs.  Follow up plan: - Return in about 1 week (around 03/31/2017).  Glade Lloyd, MD Phone: [phone]  Fax: [phone]   03/24/2017, 5:26 PM This note was partially dictated with voice recognition software. Similar sounding words can be transcribed inadequately or may not  be corrected upon review.

## 2017-03-29 ENCOUNTER — Encounter: Payer: Self-pay | Admitting: Cardiology

## 2017-03-29 ENCOUNTER — Ambulatory Visit (INDEPENDENT_AMBULATORY_CARE_PROVIDER_SITE_OTHER): Payer: PPO | Admitting: Cardiology

## 2017-03-29 VITALS — BP 100/50 | HR 74 | Ht 69.0 in | Wt 225.0 lb

## 2017-03-29 DIAGNOSIS — I481 Persistent atrial fibrillation: Secondary | ICD-10-CM

## 2017-03-29 DIAGNOSIS — Z950 Presence of cardiac pacemaker: Secondary | ICD-10-CM

## 2017-03-29 DIAGNOSIS — Z8679 Personal history of other diseases of the circulatory system: Secondary | ICD-10-CM | POA: Diagnosis not present

## 2017-03-29 DIAGNOSIS — R001 Bradycardia, unspecified: Secondary | ICD-10-CM | POA: Diagnosis not present

## 2017-03-29 DIAGNOSIS — I4819 Other persistent atrial fibrillation: Secondary | ICD-10-CM

## 2017-03-29 DIAGNOSIS — I10 Essential (primary) hypertension: Secondary | ICD-10-CM | POA: Diagnosis not present

## 2017-03-29 NOTE — Patient Instructions (Signed)
Medication Instructions:  Your physician recommends that you continue on your current medications as directed. Please refer to the Current Medication list given to you today.  Labwork: CBC, BMET- Orders given today   Testing/Procedures: NONE  Follow-Up: Your physician wants you to follow-up in: [place]. You will receive a reminder letter in the mail two months in advance. If you don't receive a letter, please call our office to schedule the follow-up appointment.  Any Other Special Instructions Will Be Listed Below (If Applicable).  If you need a refill on your cardiac medications before your next appointment, please call your pharmacy.

## 2017-03-29 NOTE — Progress Notes (Signed)
Cardiology Office Note  Date: 03/29/2017   ID: Farooq, Petrovich 1945-11-18, MRN [id number]  PCP: Glenda Chroman, MD  Primary Cardiologist: Rozann Lesches, MD   Chief Complaint  Patient presents with  . Atrial Fibrillation    History of Present Illness: ARBY DAHIR is a 71 y.o. male last seen in March. He presents with his wife for a routine follow-up visit. Since last encounter he reports no significant palpitations. He has had no exertional chest pain. He is in the process of working on better glucose control with his endocrinologist. Part of this includes a walking regimen which he has been doing every day.  He continues to follow in the device clinic with Dr. [name], Medtronic pacemaker in place.  I reviewed his medications. Cardiac regimen includes Eliquis, Coreg, lisinopril, Zocor, and as needed nitroglycerin.  Blood pressure was low normal today. He denies any recent dizziness, has had no syncope. His wife states that his blood pressure just recently was in the 130 to 725 range systolic.  Past Medical History:  Diagnosis Date  . Ankylosing spondylitis ([place])   . Anxiety   . Aortic root dilatation (H[facility])    Mild  . AR (aortic regurgitation)    Mild  . Atrial fibrillation and flutter ([place])   . Bladder cancer ([place])   . Chronic pain    Followed at [facility]  . DDD (degenerative disc disease), lumbosacral   . DDD (degenerative disc disease), thoracolumbar   . Degenerative cervical disc   . Depression   . Diabetes mellitus type II   . Diverticulitis   . Essential hypertension, benign   . GERD (gastroesophageal reflux disease)   . Hyperlipidemia   . Kidney stones   . Migraine   . Nonischemic cardiomyopathy ([place])    Cardiolite 12/09 LVEF 55%, minor luminal irregularities at cardiac catheterization  . OSA on CPAP   . Rheumatoid arthritis ([place])   . Symptomatic bradycardia    Medtronic Advisa L dual-chamber pacemaker  05/2015 - Dr. Curt Bears    Past Surgical History:    Procedure Laterality Date  . CARDIAC CATHETERIZATION  08/13/11  . CARDIOVERSION N/A 07/02/2015   Procedure: CARDIOVERSION;  Surgeon: Sanda Klein, MD;  Location: [facility];  Service: Cardiovascular;  Laterality: N/A;  . CYSTOSCOPY WITH HOLMIUM LASER LITHOTRIPSY  1990's  . EP IMPLANTABLE DEVICE N/A 05/21/2015   MDT Advisa DR MRI pacemaker implanted by Dr Curt Bears  . KNEE ARTHROSCOPY Right ~ 1991  . LAPAROSCOPIC CHOLECYSTECTOMY  ~ 2008  . TRANSURETHRAL RESECTION OF BLADDER TUMOR WITH GYRUS (TURBT-GYRUS)  1990's?    Current Outpatient Prescriptions  Medication Sig Dispense Refill  . albuterol (PROVENTIL) (2.5 MG/3ML) 0.083% nebulizer solution Take 2.5 mg by nebulization every 6 (six) hours as needed for wheezing.    Marland Kitchen apixaban (ELIQUIS) 5 MG TABS tablet Take 1 tablet (5 mg total) by mouth 2 (two) times daily. 56 tablet 0  . carvedilol (COREG) 6.25 MG tablet Take 1 tablet (6.25 mg total) by mouth 2 (two) times daily with a meal. 180 tablet 2  . Cholecalciferol (VITAMIN D-3) 1000 UNITS CAPS Take 1,000 Units by mouth 2 (two) times daily.     . cholestyramine (QUESTRAN) 4 g packet Take 4 g by mouth 3 (three) times daily with meals.    . citalopram (CELEXA) 40 MG tablet Take 20-40 mg by mouth daily.     Marland Kitchen gabapentin (NEURONTIN) 400 MG capsule Take 400 mg by mouth 3 (three) times daily.     Marland Kitchen  ibuprofen (ADVIL,MOTRIN) 200 MG tablet Take 400 mg by mouth every 6 (six) hours as needed for headache or moderate pain.    Marland Kitchen ipratropium-albuterol (DUONEB) 0.5-2.5 (3) MG/3ML SOLN Take 3 mLs by nebulization every 4 (four) hours as needed (for shortness of breath).    Marland Kitchen lisinopril (PRINIVIL,ZESTRIL) 20 MG tablet TAKE ONE TABLET BY MOUTH DAILY. (Patient taking differently: take 1 1/2 tabs by mouth daily) 30 tablet 2  . metFORMIN (GLUCOPHAGE) 1000 MG tablet Take 500 mg by mouth 2 (two) times daily.    . methylPREDNISolone (MEDROL DOSEPAK) 4 MG TBPK tablet Take by mouth.    . metoCLOPramide (REGLAN) 5 MG  tablet Take 5 mg by mouth every 6 (six) hours as needed for nausea or vomiting.     . nitroGLYCERIN (NITROSTAT) 0.4 MG SL tablet Place 1 tablet (0.4 mg total) under the tongue every 5 (five) minutes as needed for chest pain (up to 3 doses. If no relief after the 3 rd dose, proceed to the ED for an evaluation). May repeat for up to 3 doses. 25 tablet 3  . NON FORMULARY 1 each by Other route See admin instructions. CPAP - Uses nightly    . simvastatin (ZOCOR) 40 MG tablet Take 1 tablet (40 mg total) by mouth at bedtime. 90 tablet 3  . tamsulosin (FLOMAX) 0.4 MG CAPS capsule Take 0.4 mg by mouth daily.    Marland Kitchen tiZANidine (ZANAFLEX) 4 MG tablet Take 4-8 mg by mouth every 8 (eight) hours as needed.     . vitamin B-12 (CYANOCOBALAMIN) 1000 MCG tablet Take 1,000 mcg by mouth 2 (two) times daily.      . VOLTAREN 1 % GEL Apply 2 g topically 4 (four) times daily as needed (for pain).      No current facility-administered medications for this visit.    Allergies:  Patient has no known allergies.   Social History: The patient  reports that he has been smoking E-cigarettes.  He started smoking about 58 years ago. He has a 15.00 pack-year smoking history. He has quit using smokeless tobacco. His smokeless tobacco use included Chew. He reports that he drinks about 1.2 oz of alcohol per week . He reports that he uses drugs, including Cocaine.   ROS:  Please see the history of present illness. Otherwise, complete review of systems is positive for arthritis pains, chronic back pain, using a cane.  All other systems are reviewed and negative.   Physical Exam: VS:  BP (!) 100/50   Pulse 74   Ht 5\' 9"  (1.753 m)   Wt 225 lb (102.1 kg)   SpO2 97%   BMI 33.23 kg/m , BMI Body mass index is 33.23 kg/m.  Wt Readings from Last 3 Encounters:  03/29/17 225 lb (102.1 kg)  03/24/17 232 lb (105.2 kg)  10/27/16 236 lb (107 kg)    General: Obese male, appears comfortable at rest. Using a cane. HEENT: Conjunctiva and lids  normal, oropharynx clear. Neck: Supple, no elevated JVP or carotid bruits, no thyromegaly. Lungs: Clear to auscultation, nonlabored breathing at rest. Cardiac: Regular rate and rhythm, no S3 or significant systolic murmur, no pericardial rub. Abdomen: Soft, nontender, bowel sounds present, no guarding or rebound. Extremities: Mild lower leg edema, distal pulses 2+. Skin: Warm and dry. Tattoos. Musculoskeletal: No kyphosis. Neuropsychiatric: Alert and oriented x3, affect grossly appropriate.  ECG: I personally reviewed the tracing from 09/18/2016 which showed a ventricular paced rhythm.  Recent Labwork:  July 2018: Hemoglobin A1c 9.8  Other Studies Reviewed Today:  Echocardiogram 04/04/2015: Study Conclusions  - Left ventricle: The cavity size was normal. Wall thickness was   increased increased in a pattern of mild to moderate LVH.   Systolic function was normal. The estimated ejection fraction was   in the range of 50% to 55%. Heart rhythm is coarse atrial   fibrillation/flutter. Although no diagnostic regional wall motion   abnormality was identified, this possibility cannot be completely   excluded on the basis of this study. The study is not technically   sufficient to allow evaluation of LV diastolic function. - Aortic valve: Mildly calcified annulus. Trileaflet. There was   mild regurgitation. - Aortic root: The aortic root was mildly dilated. - Mitral valve: There was trivial regurgitation. - Left atrium: The atrium was mildly to moderately dilated. - Right ventricle: The cavity size was normal. - Right atrium: The atrium was mildly dilated. Central venous   pressure (est): 3 mm Hg. - Atrial septum: There was a patent foramen ovale with left to   right shunt by color Doppler. - Tricuspid valve: There was trivial regurgitation. - Pulmonary arteries: PA peak pressure: 23 mm Hg (S). - Pericardium, extracardiac: A trivial pericardial effusion was    identified.  Impressions:  - Mild to moderate LVH with LVEF 50-55%, indeterminate diastolic   function. Mild to moderate left atrial enlargement. Mild aortic   regurgitation. Mildly dilated aortic root. Trivial tricuspid   regurgitation with PASP 23 mmHg. PFO with right to left shunt by   color Doppler. RV contraction is normal.  Assessment and Plan:  1. History of atrial fibrillation/flutter. He is doing well without recurrent palpitations on current regimen which includes Eliquis and Coreg. Plan to follow-up CBC and BMET. He reports no bleeding problem.  2. Low-normal blood pressure with history of essential hypertension. No changes made today in current medications.  3. History of cardiomyopathy with last LVEF documented 50-55% range. Continue with medical therapy including Coreg and lisinopril.  4. Symptomatic bradycardia status post Medtronic pacemaker. He continues to follow with Dr. [name].  Current medicines were reviewed with the patient today.   Orders Placed This Encounter  Procedures  . CBC  . Basic Metabolic Panel (BMET)    Disposition: Follow-up in 6 months.  Signed, [name], MD, [facility] 03/29/2017 4:03 PM    [facility] [place], [place], [place] [address] Phone: [phone]; Fax: [phone]

## 2017-03-31 ENCOUNTER — Encounter: Payer: Self-pay | Admitting: "Endocrinology

## 2017-03-31 ENCOUNTER — Ambulatory Visit (INDEPENDENT_AMBULATORY_CARE_PROVIDER_SITE_OTHER): Payer: PPO | Admitting: "Endocrinology

## 2017-03-31 VITALS — BP 115/76 | HR 80 | Ht 69.0 in | Wt 221.0 lb

## 2017-03-31 DIAGNOSIS — I10 Essential (primary) hypertension: Secondary | ICD-10-CM

## 2017-03-31 DIAGNOSIS — E782 Mixed hyperlipidemia: Secondary | ICD-10-CM

## 2017-03-31 DIAGNOSIS — N182 Chronic kidney disease, stage 2 (mild): Secondary | ICD-10-CM | POA: Diagnosis not present

## 2017-03-31 DIAGNOSIS — E6609 Other obesity due to excess calories: Secondary | ICD-10-CM | POA: Diagnosis not present

## 2017-03-31 DIAGNOSIS — E1122 Type 2 diabetes mellitus with diabetic chronic kidney disease: Secondary | ICD-10-CM | POA: Diagnosis not present

## 2017-03-31 DIAGNOSIS — Z6834 Body mass index (BMI) 34.0-34.9, adult: Secondary | ICD-10-CM

## 2017-03-31 MED ORDER — INSULIN PEN NEEDLE 31G X 8 MM MISC: 3 refills | Status: DC

## 2017-03-31 MED ORDER — INSULIN GLARGINE 300 UNIT/ML ~~LOC~~ SOPN: 30.0000 [IU] | PEN_INJECTOR | Freq: Every day | SUBCUTANEOUS | 2 refills | Status: DC

## 2017-03-31 NOTE — Patient Instructions (Signed)

## 2017-03-31 NOTE — Progress Notes (Signed)
Subjective:    Patient ID: Dylan Tyler, male    DOB: [birth year].  he is being seen in consultation for management of currently uncontrolled symptomatic diabetes requested by  Dylan Chroman, MD.   Past Medical History:  Diagnosis Date  . Ankylosing spondylitis ([place])   . Anxiety   . Aortic root dilatation (HCC)    Mild  . AR (aortic regurgitation)    Mild  . Atrial fibrillation and flutter ([place])   . Bladder cancer ([place])   . Chronic pain    Followed at [facility]  . DDD (degenerative disc disease), lumbosacral   . DDD (degenerative disc disease), thoracolumbar   . Degenerative cervical disc   . Depression   . Diabetes mellitus type II   . Diverticulitis   . Essential hypertension, benign   . GERD (gastroesophageal reflux disease)   . Hyperlipidemia   . Kidney stones   . Migraine   . Nonischemic cardiomyopathy ([place])    Cardiolite 12/09 LVEF 55%, minor luminal irregularities at cardiac catheterization  . OSA on CPAP   . Rheumatoid arthritis ([place])   . Symptomatic bradycardia    Medtronic Advisa L dual-chamber pacemaker  05/2015 - Dr. Curt Bears   Past Surgical History:  Procedure Laterality Date  . CARDIAC CATHETERIZATION  08/13/11  . CARDIOVERSION N/A 07/02/2015   Procedure: CARDIOVERSION;  Surgeon: Sanda Klein, MD;  Location: [facility];  Service: Cardiovascular;  Laterality: N/A;  . CYSTOSCOPY WITH HOLMIUM LASER LITHOTRIPSY  1990's  . EP IMPLANTABLE DEVICE N/A 05/21/2015   MDT Advisa DR MRI pacemaker implanted by Dr Curt Bears  . KNEE ARTHROSCOPY Right ~ 1991  . LAPAROSCOPIC CHOLECYSTECTOMY  ~ 2008  . TRANSURETHRAL RESECTION OF BLADDER TUMOR WITH GYRUS (TURBT-GYRUS)  1990's?   Social History   Social History  . Marital status: Divorced    Spouse name: N/A  . Number of children: N/A  . Years of education: N/A   Occupational History  . Disabled Disabled   Social History Main Topics  . Smoking status: Current Every Day Smoker    Packs/day: 0.30    Years: 50.00     Types: E-cigarettes    Start date: 02/26/1959    Last attempt to quit: 12/06/2013  . Smokeless tobacco: Former Systems developer    Types: Chew     Comment: 05/21/2015 "quit chewing in 1992"  . Alcohol use 1.2 oz/week    2 Cans of beer per week  . Drug use: Yes    Types: Cocaine     Comment: 05/21/2015 "quit in the early 2000"  . Sexual activity: Not Asked   Other Topics Concern  . None   Social History Narrative   Divorced   No regular exercise   Outpatient Encounter Prescriptions as of 03/31/2017  Medication Sig  . albuterol (PROVENTIL) (2.5 MG/3ML) 0.083% nebulizer solution Take 2.5 mg by nebulization every 6 (six) hours as needed for wheezing.  Marland Kitchen apixaban (ELIQUIS) 5 MG TABS tablet Take 1 tablet (5 mg total) by mouth 2 (two) times daily.  . carvedilol (COREG) 6.25 MG tablet Take 1 tablet (6.25 mg total) by mouth 2 (two) times daily with a meal.  . Cholecalciferol (VITAMIN D-3) 1000 UNITS CAPS Take 1,000 Units by mouth 2 (two) times daily.   . cholestyramine (QUESTRAN) 4 g packet Take 4 g by mouth 3 (three) times daily with meals.  . citalopram (CELEXA) 40 MG tablet Take 20-40 mg by mouth daily.   Marland Kitchen gabapentin (NEURONTIN) 400 MG capsule  Take 400 mg by mouth 3 (three) times daily.   Marland Kitchen ibuprofen (ADVIL,MOTRIN) 200 MG tablet Take 400 mg by mouth every 6 (six) hours as needed for headache or moderate pain.  . Insulin Glargine (TOUJEO MAX SOLOSTAR) 300 UNIT/ML SOPN Inject 30 Units into the skin at bedtime.  . Insulin Pen Needle (B-D ULTRAFINE III SHORT PEN) 31G X 8 MM MISC Use to inject insulin.  Marland Kitchen ipratropium-albuterol (DUONEB) 0.5-2.5 (3) MG/3ML SOLN Take 3 mLs by nebulization every 4 (four) hours as needed (for shortness of breath).  Marland Kitchen lisinopril (PRINIVIL,ZESTRIL) 20 MG tablet TAKE ONE TABLET BY MOUTH DAILY. (Patient taking differently: take 1 1/2 tabs by mouth daily)  . metFORMIN (GLUCOPHAGE) 1000 MG tablet Take 500 mg by mouth 2 (two) times daily.  . methylPREDNISolone (MEDROL DOSEPAK) 4 MG  TBPK tablet Take by mouth.  . metoCLOPramide (REGLAN) 5 MG tablet Take 5 mg by mouth every 6 (six) hours as needed for nausea or vomiting.   . nitroGLYCERIN (NITROSTAT) 0.4 MG SL tablet Place 1 tablet (0.4 mg total) under the tongue every 5 (five) minutes as needed for chest pain (up to 3 doses. If no relief after the 3 rd dose, proceed to the ED for an evaluation). May repeat for up to 3 doses.  . NON FORMULARY 1 each by Other route See admin instructions. CPAP - Uses nightly  . simvastatin (ZOCOR) 40 MG tablet Take 1 tablet (40 mg total) by mouth at bedtime.  . tamsulosin (FLOMAX) 0.4 MG CAPS capsule Take 0.4 mg by mouth daily.  Marland Kitchen tiZANidine (ZANAFLEX) 4 MG tablet Take 4-8 mg by mouth every 8 (eight) hours as needed.   . vitamin B-12 (CYANOCOBALAMIN) 1000 MCG tablet Take 1,000 mcg by mouth 2 (two) times daily.    . VOLTAREN 1 % GEL Apply 2 g topically 4 (four) times daily as needed (for pain).    No facility-administered encounter medications on file as of 03/31/2017.     ALLERGIES: No Known Allergies  VACCINATION STATUS:  There is no immunization history on file for this patient.  Diabetes  He presents for his follow-up diabetic visit. He has type 2 diabetes mellitus. Onset time: He was diagnosed at approximate age of 5 years. His disease course has been worsening (His most recent A1c was 9.8% on 01/12/2017.). There are no hypoglycemic associated symptoms. Pertinent negatives for hypoglycemia include no confusion, headaches, pallor or seizures. Associated symptoms include foot paresthesias, polydipsia and polyuria. Pertinent negatives for diabetes include no chest pain, no fatigue, no polyphagia and no weakness. There are no hypoglycemic complications. Symptoms are worsening. Diabetic complications include peripheral neuropathy. Risk factors for coronary artery disease include dyslipidemia, diabetes mellitus, hypertension, male sex, sedentary lifestyle, tobacco exposure and family history.  Current diabetic treatment includes oral agent (dual therapy). Compliance with diabetes treatment: She admits to dietary indiscretion consuming large quantities of processed colitis including soda, however he promises to change that. His weight is decreasing steadily. He is following a generally unhealthy diet. When asked about meal planning, he reported none. He has not had a previous visit with a dietitian. He never (He is wheelchair-bound due to deconditioning, and due todegenerative joint disease.) participates in exercise. His breakfast blood glucose range is generally >200 mg/dl. His dinner blood glucose range is generally >200 mg/dl. His overall blood glucose range is >200 mg/dl. An ACE inhibitor/angiotensin II receptor blocker is being taken. He sees a podiatrist. Hyperlipidemia  This is a chronic problem. The current episode started more  than 1 year ago. Exacerbating diseases include diabetes and obesity. Factors aggravating his hyperlipidemia include smoking. Pertinent negatives include no chest pain, myalgias or shortness of breath. Current antihyperlipidemic treatment includes statins. Risk factors for coronary artery disease include dyslipidemia, diabetes mellitus, hypertension, a sedentary lifestyle, male sex and obesity.  Hypertension  This is a chronic problem. The current episode started more than 1 month ago. Pertinent negatives include no chest pain, headaches, neck pain, palpitations or shortness of breath. Risk factors for coronary artery disease include diabetes mellitus, dyslipidemia, male gender, obesity, sedentary lifestyle and smoking/tobacco exposure. Past treatments include ACE inhibitors.     Review of Systems  Constitutional: Negative for chills, fatigue, fever and unexpected weight change.  HENT: Negative for dental problem, mouth sores and trouble swallowing.   Eyes: Negative for visual disturbance.  Respiratory: Negative for cough, choking, chest tightness, shortness of  breath and wheezing.   Cardiovascular: Negative for chest pain, palpitations and leg swelling.  Gastrointestinal: Negative for abdominal distention, abdominal pain, constipation, diarrhea, nausea and vomiting.  Endocrine: Positive for polydipsia and polyuria. Negative for polyphagia.  Genitourinary: Negative for dysuria, flank pain, hematuria and urgency.  Musculoskeletal: Positive for gait problem. Negative for back pain, myalgias and neck pain.       Walks with a walker.  Skin: Negative for pallor, rash and wound.  Neurological: Negative for seizures, syncope, weakness, numbness and headaches.  Psychiatric/Behavioral: Negative.  Negative for confusion and dysphoric mood.    Objective:    BP 115/76   Pulse 80   Ht 5\' 9"  (1.753 m)   Wt 221 lb (100.2 kg)   BMI 32.64 kg/m   Wt Readings from Last 3 Encounters:  03/31/17 221 lb (100.2 kg)  03/29/17 225 lb (102.1 kg)  03/24/17 232 lb (105.2 kg)     Physical Exam  Constitutional: He is oriented to person, place, and time. He appears well-developed and well-nourished. He is cooperative. No distress.  HENT:  Head: Normocephalic and atraumatic.  Eyes: EOM are normal.  Neck: Normal range of motion. Neck supple. No tracheal deviation present. No thyromegaly present.  Cardiovascular: Normal rate, S1 normal, S2 normal and normal heart sounds.  Exam reveals no gallop.   No murmur heard. Pulses:      Dorsalis pedis pulses are 1+ on the right side, and 1+ on the left side.       Posterior tibial pulses are 1+ on the right side, and 1+ on the left side.  Pulmonary/Chest: Breath sounds normal. No respiratory distress. He has no wheezes.  Abdominal: Soft. Bowel sounds are normal. He exhibits no distension. There is no tenderness. There is no guarding and no CVA tenderness.  Musculoskeletal: He exhibits no edema.       Right shoulder: He exhibits no swelling and no deformity.  Uses walker to get around.  Neurological: He is alert and oriented  to person, place, and time. He has normal strength and normal reflexes. No cranial nerve deficit or sensory deficit. Gait normal.  Skin: Skin is warm and dry. No rash noted. No cyanosis. Nails show no clubbing.  Psychiatric: He has a normal mood and affect. His speech is normal. Cognition and memory are normal.    CMP     Component Value Date/Time   NA 137 05/21/2015 0848   K 4.2 05/21/2015 0848   CL 103 05/21/2015 0848   CO2 22 05/21/2015 0848   GLUCOSE 162 (H) 05/21/2015 0848   BUN 13 05/21/2015 0848  CREATININE 1.13 05/21/2015 0848   CREATININE 1.29 (H) 04/12/2015 1609   CALCIUM 9.6 05/21/2015 0848   GFRNONAA >60 05/21/2015 0848   GFRAA >60 05/21/2015 0848   A1c from 01/07/2017 was 9.8%.     Assessment & Plan:   1. Type 2 diabetes mellitus with stage 2 chronic kidney disease, without long-term current use of insulin ([place])  - Patient has currently uncontrolled symptomatic type 2 DM since  71 years of age,  with most recent A1c of 9.8 %. Recent labs reviewed. - He comes with persistently elevated blood glucose profile averaging 300+. -his diabetes is complicated by stage 2 renal insufficiency, obesity/sedentary life, neuropathy, cardiomyopathy and CLIFFORD BENNINGER remains at a high risk for more acute and chronic complications which include CAD, CVA, CKD, retinopathy, and neuropathy. These are all discussed in detail with the patient.  - I have counseled him on diet management and weight loss, by adopting a carbohydrate restricted/protein rich diet.  - Suggestion is made for him to avoid simple carbohydrates  from his diet including Cakes, Sweet Desserts, Ice Cream, Soda (diet and regular), Sweet Tea, Candies, Chips, Cookies, Store Bought Juices, Alcohol in Excess of  1-2 drinks a day, Artificial Sweeteners, and "Sugar-free" Products. This will help patient to have stable blood glucose profile and potentially avoid unintended weight gain.  - I encouraged him to switch to   unprocessed or minimally processed complex starch and increased protein intake (animal or plant source), fruits, and vegetables.  - he is advised to stick to a routine mealtimes to eat 3 meals  a day and avoid unnecessary snacks ( to snack only to correct hypoglycemia).   - I have approached him with the following individualized plan to manage diabetes and patient agrees:   - Based on his glucose profile, he will need at least basal insulin. I have discussed and initiated Tresiba 30 units daily at bedtime associated with strict monitoring of blood glucose 2 times a day-daily before breakfast and at bedtime. - He is advised to stay committed for proper monitoring for safe use of insulin.  -Patient is encouraged to call clinic for blood glucose levels less than 70 or above 300 mg /dl. - I will continue metformin 500 mg by mouth twice a day, cannot tolerate full dose due to CK D, therapeutically suitable for patient . - I have advised him to discontinue glimepiride.  - he will be considered for incretin therapy as appropriate next visit. - Patient specific target  A1c;  LDL, HDL, Triglycerides, and  Waist Circumference were discussed in detail.  2) BP/HTN: Controlled. Continue current medications including ACEI/ARB. 3) Lipids/HPL:   Lipid panel unknown.   Patient is advised to continue statins. 4)  Weight/Diet: CDE Consult will be initiated , exercise, and detailed carbohydrates information provided.  5) Chronic Care/Health Maintenance:  -he  is on ACEI/ARB and Statin medications and  is encouraged to continue to follow up with Ophthalmology, Dentist,  Podiatrist at least yearly or according to recommendations, and advised to  stay away from smoking. I have recommended yearly flu vaccine and pneumonia vaccination at least every 5 years; moderate intensity exercise for up to 150 minutes weekly; and  sleep for at least 7 hours a day.  - Time spent with the patient: 25 min, of which >50% was spent  in reviewing his sugar logs , discussing his hypo- and hyper-glycemic episodes, reviewing his current and  previous labs and insulin doses and developing a plan to avoid  hypo- and hyper-glycemia.    - Patient to bring meter and  blood glucose logs during his next visit.  - I advised patient to maintain close follow up with Dylan Chroman, MD for primary care needs.  Follow up plan: - Return in about 1 week (around 04/07/2017) for follow up with meter and logs- no labs.  Glade Lloyd, MD Phone: [phone]  Fax: [phone]   03/31/2017, 4:53 PM This note was partially dictated with voice recognition software. Similar sounding words can be transcribed inadequately or may not  be corrected upon review.

## 2017-04-13 ENCOUNTER — Ambulatory Visit: Payer: PPO | Admitting: "Endocrinology

## 2017-04-13 ENCOUNTER — Other Ambulatory Visit: Payer: Self-pay | Admitting: "Endocrinology

## 2017-04-13 DIAGNOSIS — E1165 Type 2 diabetes mellitus with hyperglycemia: Secondary | ICD-10-CM

## 2017-04-16 ENCOUNTER — Encounter: Payer: Self-pay | Admitting: "Endocrinology

## 2017-04-16 DIAGNOSIS — E1165 Type 2 diabetes mellitus with hyperglycemia: Secondary | ICD-10-CM | POA: Diagnosis not present

## 2017-04-16 LAB — BASIC METABOLIC PANEL
BUN: 24 — AB (ref 4–21)
CREATININE: 1.7 — AB (ref <=1.3)

## 2017-04-16 LAB — HEMOGLOBIN A1C: Hemoglobin A1C: 10.1

## 2017-04-20 ENCOUNTER — Telehealth: Payer: Self-pay

## 2017-04-20 DIAGNOSIS — Z7984 Long term (current) use of oral hypoglycemic drugs: Secondary | ICD-10-CM | POA: Diagnosis not present

## 2017-04-20 DIAGNOSIS — Z950 Presence of cardiac pacemaker: Secondary | ICD-10-CM | POA: Diagnosis not present

## 2017-04-20 DIAGNOSIS — F329 Major depressive disorder, single episode, unspecified: Secondary | ICD-10-CM | POA: Diagnosis not present

## 2017-04-20 DIAGNOSIS — Z299 Encounter for prophylactic measures, unspecified: Secondary | ICD-10-CM | POA: Diagnosis not present

## 2017-04-20 DIAGNOSIS — E669 Obesity, unspecified: Secondary | ICD-10-CM | POA: Diagnosis not present

## 2017-04-20 DIAGNOSIS — I4891 Unspecified atrial fibrillation: Secondary | ICD-10-CM | POA: Diagnosis not present

## 2017-04-20 DIAGNOSIS — I2510 Atherosclerotic heart disease of native coronary artery without angina pectoris: Secondary | ICD-10-CM | POA: Diagnosis not present

## 2017-04-20 DIAGNOSIS — R0600 Dyspnea, unspecified: Secondary | ICD-10-CM | POA: Diagnosis not present

## 2017-04-20 DIAGNOSIS — N179 Acute kidney failure, unspecified: Secondary | ICD-10-CM | POA: Diagnosis not present

## 2017-04-20 DIAGNOSIS — E785 Hyperlipidemia, unspecified: Secondary | ICD-10-CM | POA: Diagnosis not present

## 2017-04-20 DIAGNOSIS — Z79899 Other long term (current) drug therapy: Secondary | ICD-10-CM | POA: Diagnosis not present

## 2017-04-20 DIAGNOSIS — F17200 Nicotine dependence, unspecified, uncomplicated: Secondary | ICD-10-CM | POA: Diagnosis not present

## 2017-04-20 DIAGNOSIS — Z7902 Long term (current) use of antithrombotics/antiplatelets: Secondary | ICD-10-CM | POA: Diagnosis not present

## 2017-04-20 DIAGNOSIS — E1151 Type 2 diabetes mellitus with diabetic peripheral angiopathy without gangrene: Secondary | ICD-10-CM | POA: Diagnosis not present

## 2017-04-20 DIAGNOSIS — Z809 Family history of malignant neoplasm, unspecified: Secondary | ICD-10-CM | POA: Diagnosis not present

## 2017-04-20 DIAGNOSIS — J449 Chronic obstructive pulmonary disease, unspecified: Secondary | ICD-10-CM | POA: Diagnosis not present

## 2017-04-20 DIAGNOSIS — Z833 Family history of diabetes mellitus: Secondary | ICD-10-CM | POA: Diagnosis not present

## 2017-04-20 DIAGNOSIS — L02212 Cutaneous abscess of back [any part, except buttock]: Secondary | ICD-10-CM | POA: Diagnosis not present

## 2017-04-20 DIAGNOSIS — I10 Essential (primary) hypertension: Secondary | ICD-10-CM

## 2017-04-20 DIAGNOSIS — F419 Anxiety disorder, unspecified: Secondary | ICD-10-CM | POA: Diagnosis not present

## 2017-04-20 DIAGNOSIS — G4730 Sleep apnea, unspecified: Secondary | ICD-10-CM | POA: Diagnosis not present

## 2017-04-20 DIAGNOSIS — I951 Orthostatic hypotension: Secondary | ICD-10-CM | POA: Diagnosis not present

## 2017-04-20 DIAGNOSIS — E1165 Type 2 diabetes mellitus with hyperglycemia: Secondary | ICD-10-CM | POA: Diagnosis not present

## 2017-04-20 DIAGNOSIS — E860 Dehydration: Secondary | ICD-10-CM | POA: Diagnosis not present

## 2017-04-20 MED ORDER — LISINOPRIL 10 MG PO TABS: 10.0000 mg | ORAL_TABLET | Freq: Every day | ORAL | 0 refills | Status: DC

## 2017-04-20 NOTE — Telephone Encounter (Signed)
Patient notified. Routed to PCP. Lab orders mailed to patient.

## 2017-04-20 NOTE — Telephone Encounter (Signed)
-----   Message from Merlene Laughter, LPN sent at 35/57/3220 11:59 AM EDT -----   ----- Message ----- From: [name], MD Sent: 04/19/2017   9:51 AM To: Merlene Laughter, LPN  Results reviewed. Hemoglobin normal. Creatinine is 1.7 with potassium 6.0. I do not have recent interval lab work for comparison. Make sure that copy goes to Dr. Woody Seller who has been following him. I would recommend cutting lisinopril back to 10 mg daily for now. Repeat BMET in the next 2 weeks. A copy of this test should be forwarded to Glenda Chroman, MD.

## 2017-04-23 DIAGNOSIS — L02212 Cutaneous abscess of back [any part, except buttock]: Secondary | ICD-10-CM | POA: Diagnosis not present

## 2017-04-24 DIAGNOSIS — Z48817 Encounter for surgical aftercare following surgery on the skin and subcutaneous tissue: Secondary | ICD-10-CM | POA: Diagnosis not present

## 2017-04-24 DIAGNOSIS — L02212 Cutaneous abscess of back [any part, except buttock]: Secondary | ICD-10-CM | POA: Diagnosis not present

## 2017-04-26 ENCOUNTER — Encounter: Payer: Self-pay | Admitting: "Endocrinology

## 2017-04-26 ENCOUNTER — Ambulatory Visit (INDEPENDENT_AMBULATORY_CARE_PROVIDER_SITE_OTHER): Payer: PPO | Admitting: "Endocrinology

## 2017-04-26 VITALS — BP 123/76 | HR 80 | Ht 69.0 in | Wt 223.0 lb

## 2017-04-26 DIAGNOSIS — E1122 Type 2 diabetes mellitus with diabetic chronic kidney disease: Secondary | ICD-10-CM

## 2017-04-26 DIAGNOSIS — E782 Mixed hyperlipidemia: Secondary | ICD-10-CM

## 2017-04-26 DIAGNOSIS — E1165 Type 2 diabetes mellitus with hyperglycemia: Secondary | ICD-10-CM | POA: Diagnosis not present

## 2017-04-26 DIAGNOSIS — E6609 Other obesity due to excess calories: Secondary | ICD-10-CM

## 2017-04-26 DIAGNOSIS — IMO0002 Reserved for concepts with insufficient information to code with codable children: Secondary | ICD-10-CM

## 2017-04-26 DIAGNOSIS — N183 Chronic kidney disease, stage 3 (moderate): Secondary | ICD-10-CM

## 2017-04-26 DIAGNOSIS — I10 Essential (primary) hypertension: Secondary | ICD-10-CM

## 2017-04-26 DIAGNOSIS — Z6834 Body mass index (BMI) 34.0-34.9, adult: Secondary | ICD-10-CM

## 2017-04-26 MED ORDER — INSULIN ASPART 100 UNIT/ML FLEXPEN: 10.0000 [IU] | PEN_INJECTOR | Freq: Three times a day (TID) | SUBCUTANEOUS | 2 refills | Status: DC

## 2017-04-26 NOTE — Progress Notes (Signed)
Subjective:    Patient ID: Dylan Tyler, male    DOB: August 21, 1945.  he is being seen in consultation for management of currently uncontrolled symptomatic diabetes requested by  Dylan Chroman, MD.   Past Medical History:  Diagnosis Date  . Ankylosing spondylitis ([place])   . Anxiety   . Aortic root dilatation ([facility])    Mild  . AR (aortic regurgitation)    Mild  . Atrial fibrillation and flutter ([place])   . Bladder cancer ([place])   . Chronic pain    Followed at [facility]  . DDD (degenerative disc disease), lumbosacral   . DDD (degenerative disc disease), thoracolumbar   . Degenerative cervical disc   . Depression   . Diabetes mellitus type II   . Diverticulitis   . Essential hypertension, benign   . GERD (gastroesophageal reflux disease)   . Hyperlipidemia   . Kidney stones   . Migraine   . Nonischemic cardiomyopathy ([place])    Cardiolite 12/09 LVEF 55%, minor luminal irregularities at cardiac catheterization  . OSA on CPAP   . Rheumatoid arthritis ([place])   . Symptomatic bradycardia    Medtronic Advisa L dual-chamber pacemaker  05/2015 - Dr. Curt Bears   Past Surgical History:  Procedure Laterality Date  . CARDIAC CATHETERIZATION  08/13/11  . CARDIOVERSION N/A 07/02/2015   Procedure: CARDIOVERSION;  Surgeon: Sanda Klein, MD;  Location: [facility] ENDOSCOPY;  Service: Cardiovascular;  Laterality: N/A;  . CYSTOSCOPY WITH HOLMIUM LASER LITHOTRIPSY  1990's  . EP IMPLANTABLE DEVICE N/A 05/21/2015   MDT Advisa DR MRI pacemaker implanted by Dr Curt Bears  . KNEE ARTHROSCOPY Right ~ 1991  . LAPAROSCOPIC CHOLECYSTECTOMY  ~ 2008  . TRANSURETHRAL RESECTION OF BLADDER TUMOR WITH GYRUS (TURBT-GYRUS)  1990's?   Social History   Social History  . Marital status: Divorced    Spouse name: N/A  . Number of children: N/A  . Years of education: N/A   Occupational History  . Disabled Disabled   Social History Main Topics  . Smoking status: Current Every Day Smoker    Packs/day: 0.30    Years: 50.00     Types: E-cigarettes    Start date: 02/26/1959    Last attempt to quit: 12/06/2013  . Smokeless tobacco: Former Systems developer    Types: Chew     Comment: 05/21/2015 "quit chewing in 1992"  . Alcohol use 1.2 oz/week    2 Cans of beer per week  . Drug use: Yes    Types: Cocaine     Comment: 05/21/2015 "quit in the early 2000"  . Sexual activity: Not Asked   Other Topics Concern  . None   Social History Narrative   Divorced   No regular exercise   Outpatient Encounter Prescriptions as of 04/26/2017  Medication Sig  . Insulin Glargine (TOUJEO MAX SOLOSTAR [place]) Inject 50 Units into the skin at bedtime.  Marland Kitchen albuterol (PROVENTIL) (2.5 MG/3ML) 0.083% nebulizer solution Take 2.5 mg by nebulization every 6 (six) hours as needed for wheezing.  Marland Kitchen apixaban (ELIQUIS) 5 MG TABS tablet Take 1 tablet (5 mg total) by mouth 2 (two) times daily.  . carvedilol (COREG) 6.25 MG tablet Take 1 tablet (6.25 mg total) by mouth 2 (two) times daily with a meal.  . Cholecalciferol (VITAMIN D-3) 1000 UNITS CAPS Take 1,000 Units by mouth 2 (two) times daily.   . cholestyramine (QUESTRAN) 4 g packet Take 4 g by mouth 3 (three) times daily with meals.  . citalopram (CELEXA) 40  MG tablet Take 20-40 mg by mouth daily.   Marland Kitchen gabapentin (NEURONTIN) 400 MG capsule Take 400 mg by mouth 3 (three) times daily.   Marland Kitchen ibuprofen (ADVIL,MOTRIN) 200 MG tablet Take 400 mg by mouth every 6 (six) hours as needed for headache or moderate pain.  Marland Kitchen insulin aspart (NOVOLOG FLEXPEN) 100 UNIT/ML FlexPen Inject 10-16 Units into the skin 3 (three) times daily with meals.  . Insulin Pen Needle (B-D ULTRAFINE III SHORT PEN) 31G X 8 MM MISC Use to inject insulin.  Marland Kitchen ipratropium-albuterol (DUONEB) 0.5-2.5 (3) MG/3ML SOLN Take 3 mLs by nebulization every 4 (four) hours as needed (for shortness of breath).  . methylPREDNISolone (MEDROL DOSEPAK) 4 MG TBPK tablet Take by mouth.  . metoCLOPramide (REGLAN) 5 MG tablet Take 5 mg by mouth every 6 (six) hours as  needed for nausea or vomiting.   . nitroGLYCERIN (NITROSTAT) 0.4 MG SL tablet Place 1 tablet (0.4 mg total) under the tongue every 5 (five) minutes as needed for chest pain (up to 3 doses. If no relief after the 3 rd dose, proceed to the ED for an evaluation). May repeat for up to 3 doses.  . NON FORMULARY 1 each by Other route See admin instructions. CPAP - Uses nightly  . simvastatin (ZOCOR) 40 MG tablet Take 1 tablet (40 mg total) by mouth at bedtime.  . vitamin B-12 (CYANOCOBALAMIN) 1000 MCG tablet Take 1,000 mcg by mouth 2 (two) times daily.    . VOLTAREN 1 % GEL Apply 2 g topically 4 (four) times daily as needed (for pain).   . [DISCONTINUED] Insulin Glargine (TOUJEO MAX SOLOSTAR) 300 UNIT/ML SOPN Inject 30 Units into the skin at bedtime.  . [DISCONTINUED] lisinopril (PRINIVIL,ZESTRIL) 10 MG tablet Take 1 tablet (10 mg total) by mouth daily.  . [DISCONTINUED] metFORMIN (GLUCOPHAGE) 1000 MG tablet Take 500 mg by mouth 2 (two) times daily.  . [DISCONTINUED] tamsulosin (FLOMAX) 0.4 MG CAPS capsule Take 0.4 mg by mouth daily.  . [DISCONTINUED] tiZANidine (ZANAFLEX) 4 MG tablet Take 4-8 mg by mouth every 8 (eight) hours as needed.    No facility-administered encounter medications on file as of 04/26/2017.     ALLERGIES: No Known Allergies  VACCINATION STATUS:  There is no immunization history on file for this patient.  Diabetes  He presents for his follow-up diabetic visit. He has type 2 diabetes mellitus. Onset time: He was diagnosed at approximate age of 8 years. His disease course has been worsening (His most recent A1c was 9.8% on 01/12/2017.). There are no hypoglycemic associated symptoms. Pertinent negatives for hypoglycemia include no confusion, headaches, pallor or seizures. Associated symptoms include foot paresthesias, polydipsia and polyuria. Pertinent negatives for diabetes include no chest pain, no fatigue, no polyphagia and no weakness. There are no hypoglycemic complications.  Symptoms are worsening. Diabetic complications include peripheral neuropathy. Risk factors for coronary artery disease include dyslipidemia, diabetes mellitus, hypertension, male sex, sedentary lifestyle, tobacco exposure and family history. Current diabetic treatment includes oral agent (dual therapy). Compliance with diabetes treatment: She admits to dietary indiscretion consuming large quantities of processed colitis including soda, however he promises to change that. His weight is stable. He is following a generally unhealthy diet. When asked about meal planning, he reported none. He has not had a previous visit with a dietitian. He never (He is wheelchair-bound due to deconditioning, and due todegenerative joint disease.) participates in exercise. His breakfast blood glucose range is generally >200 mg/dl. His dinner blood glucose range is generally >200  mg/dl. His overall blood glucose range is >200 mg/dl. An ACE inhibitor/angiotensin II receptor blocker is being taken. He sees a podiatrist. Hyperlipidemia  This is a chronic problem. The current episode started more than 1 year ago. Exacerbating diseases include diabetes and obesity. Factors aggravating his hyperlipidemia include smoking. Pertinent negatives include no chest pain, myalgias or shortness of breath. Current antihyperlipidemic treatment includes statins. Risk factors for coronary artery disease include dyslipidemia, diabetes mellitus, hypertension, a sedentary lifestyle, male sex and obesity.  Hypertension  This is a chronic problem. The current episode started more than 1 month ago. Pertinent negatives include no chest pain, headaches, neck pain, palpitations or shortness of breath. Risk factors for coronary artery disease include diabetes mellitus, dyslipidemia, male gender, obesity, sedentary lifestyle and smoking/tobacco exposure. Past treatments include ACE inhibitors.     Review of Systems  Constitutional: Negative for chills,  fatigue, fever and unexpected weight change.  HENT: Negative for dental problem, mouth sores and trouble swallowing.   Eyes: Negative for visual disturbance.  Respiratory: Negative for cough, choking, chest tightness, shortness of breath and wheezing.   Cardiovascular: Negative for chest pain, palpitations and leg swelling.  Gastrointestinal: Negative for abdominal distention, abdominal pain, constipation, diarrhea, nausea and vomiting.  Endocrine: Positive for polydipsia and polyuria. Negative for polyphagia.  Genitourinary: Negative for dysuria, flank pain, hematuria and urgency.  Musculoskeletal: Positive for gait problem. Negative for back pain, myalgias and neck pain.       Walks with a walker.  Skin: Negative for pallor, rash and wound.  Neurological: Negative for seizures, syncope, weakness, numbness and headaches.  Psychiatric/Behavioral: Negative for confusion and dysphoric mood.    Objective:    BP 123/76   Pulse 80   Ht 5\' 9"  (1.753 m)   Wt 223 lb (101.2 kg)   BMI 32.93 kg/m   Wt Readings from Last 3 Encounters:  04/26/17 223 lb (101.2 kg)  03/31/17 221 lb (100.2 kg)  03/29/17 225 lb (102.1 kg)     Physical Exam  Constitutional: He is oriented to person, place, and time. He appears well-developed and well-nourished. He is cooperative. No distress.  HENT:  Head: Normocephalic and atraumatic.  Eyes: EOM are normal.  Neck: Normal range of motion. Neck supple. No tracheal deviation present. No thyromegaly present.  Cardiovascular: Normal rate, S1 normal, S2 normal and normal heart sounds.  Exam reveals no gallop.   No murmur heard. Pulses:      Dorsalis pedis pulses are 1+ on the right side, and 1+ on the left side.       Posterior tibial pulses are 1+ on the right side, and 1+ on the left side.  Pulmonary/Chest: Breath sounds normal. No respiratory distress. He has no wheezes.  Abdominal: Soft. Bowel sounds are normal. He exhibits no distension. There is no  tenderness. There is no guarding and no CVA tenderness.  Musculoskeletal: He exhibits no edema.       Right shoulder: He exhibits no swelling and no deformity.  Uses walker to get around.  Neurological: He is alert and oriented to person, place, and time. He has normal strength and normal reflexes. No cranial nerve deficit or sensory deficit. Gait normal.  Skin: Skin is warm and dry. No rash noted. No cyanosis. Nails show no clubbing.  Psychiatric: He has a normal mood and affect. His speech is normal. Cognition and memory are normal.    CMP     Component Value Date/Time   NA 137 05/21/2015 0848  K 4.2 05/21/2015 0848   CL 103 05/21/2015 0848   CO2 22 05/21/2015 0848   GLUCOSE 162 (H) 05/21/2015 0848   BUN 24 (A) 04/16/2017   CREATININE 1.7 (A) 04/16/2017   CREATININE 1.13 05/21/2015 0848   CREATININE 1.29 (H) 04/12/2015 1609   CALCIUM 9.6 05/21/2015 0848   GFRNONAA >60 05/21/2015 0848   GFRAA >60 05/21/2015 0848   A1c from 01/07/2017 was 9.8%.  Recent Results (from the past 2160 hour(s))  CUP PACEART REMOTE DEVICE CHECK     Status: None   Collection Time: 01/26/17  9:00 PM  Result Value Ref Range   Date Time Interrogation Session [id number]    Pulse Generator Manufacturer MERM    Pulse Gen Model [id number] Advisa DR MRI    Pulse Gen Serial Number [id number] [facility]    Implantable Pulse Generator Type Implantable Pulse Generator    Implantable Pulse Generator Implant Date [id number]    Lead Channel Setting Sensing Sensitivity 2.8 mV   Lead Channel Setting Pacing Pulse Width 0.4 ms   Lead Channel Setting Pacing Amplitude 2.5 V   Lead Channel Impedance Value 494 ohm   Lead Channel Impedance Value 361 ohm   Lead Channel Sensing Intrinsic Amplitude 1.375 mV   Lead Channel Sensing Intrinsic Amplitude 1.375 mV   Lead Channel Pacing Threshold Amplitude 0.5 V   Lead Channel Pacing Threshold Pulse Width 0.4 ms   Lead Channel Impedance Value 456 ohm    Lead Channel Impedance Value 380 ohm   Lead Channel Sensing Intrinsic Amplitude 15.625 mV   Lead Channel Sensing Intrinsic Amplitude 15.625 mV   Lead Channel Pacing Threshold Amplitude 0.625 V   Lead Channel Pacing Threshold Pulse Width 0.4 ms   Battery Status OK    Battery Remaining Longevity 70 mo   Battery Voltage 3.00 V   Brady Statistic RA Percent Paced 0 %   Brady Statistic RV Percent Paced 96.50 %   Brady Statistic AP VP Percent 0 %   Brady Statistic AS VP Percent 94.59 %   Brady Statistic AP VS Percent 0 %   Brady Statistic AS VS Percent 5.41 %   Eval Rhythm AF,VP   Basic metabolic panel     Status: Abnormal   Collection Time: 04/16/17 12:00 AM  Result Value Ref Range   BUN 24 (A) 4 - 21   Creatinine 1.7 (A) 0.6 - 1.3  Hemoglobin A1c     Status: None   Collection Time: 04/16/17 12:00 AM  Result Value Ref Range   Hemoglobin A1C 10.1       Assessment & Plan:   1. Type 2 diabetes mellitus with stage 3 chronic kidney disease, without long-term current use of insulin ([place])  - Patient has currently uncontrolled symptomatic type 2 DM since  71 years of age. -Patient came with slightly better but still persistently above target blood glucose profile with A1c 10.1% unchanged from 9.8% last visit.   -his diabetes is complicated by stage 3 renal insufficiency, obesity/sedentary life, neuropathy, cardiomyopathy and LELA GELL remains at a high risk for more acute and chronic complications which include CAD, CVA, CKD, retinopathy, and neuropathy. These are all discussed in detail with the patient.  - I have counseled him on diet management and weight loss, by adopting a carbohydrate restricted/protein rich diet.  -  Suggestion is made for him to avoid simple carbohydrates  from his diet including Cakes, Sweet Desserts / Pastries, Ice Cream, Soda (  diet and regular), Sweet Tea, Candies, Chips, Cookies, Store Bought Juices, Alcohol in Excess of  1-2 drinks a day, Artificial  Sweeteners, and "Sugar-free" Products. This will help patient to have stable blood glucose profile and potentially avoid unintended weight gain.   - I encouraged him to switch to  unprocessed or minimally processed complex starch and increased protein intake (animal or plant source), fruits, and vegetables.  - he is advised to stick to a routine mealtimes to eat 3 meals  a day and avoid unnecessary snacks ( to snack only to correct hypoglycemia).   - I have approached him with the following individualized plan to manage diabetes and patient agrees:   - Based on his glucose profile, he will need at basal /bolus  Insulin for treatment of diabetes to target.  -Currently, he is dealing with a gap in his insurance coverage struggling to pay for medications.   -He is receiving samples of basal insulin ,  Toujeo.  I will increase his basal insulin to 50 units nightly, initiate prandial insulin with NovoLog 10 units 3 times daily before meals for pre-meal blood glucose readings above 90 mg/dL (with additional correction for readings above 150 mg/dL)  associated with strict monitoring of blood glucose 4 times a day- before meals and at bedtime. - He is advised to stay committed for proper monitoring for safe use of insulin.  -Patient is encouraged to call clinic for blood glucose levels less than 70 or above 300 mg /dl. - I will disontinue metformin  due to CKD. - He  will be considered for incretin therapy when he gets appropriate insurance coverage.  - Patient specific target  A1c;  LDL, HDL, Triglycerides, and  Waist Circumference were discussed in detail.  2) BP/HTN: Controlled.  Continue current medications including ACEI/ARB. 3) Lipids/HPL:   Lipid panel unknown.   Patient is advised to continue statins. 4)  Weight/Diet: CDE Consult has been initiated , exercise, and detailed carbohydrates information provided.  5) Chronic Care/Health Maintenance:  -he  is on ACEI/ARB and Statin medications and   is encouraged to continue to follow up with Ophthalmology, Dentist,  Podiatrist at least yearly or according to recommendations, and advised to  stay away from smoking. I have recommended yearly flu vaccine and pneumonia vaccination at least every 5 years; moderate intensity exercise for up to 150 minutes weekly; and  sleep for at least 7 hours a day.  - Time spent with the patient: 25 min, of which >50% was spent in reviewing his sugar logs , discussing his hypo- and hyper-glycemic episodes, reviewing his current and  previous labs and insulin doses and developing a plan to avoid hypo- and hyper-glycemia.   - I advised patient to maintain close follow up with Dylan Chroman, MD for primary care needs.  Follow up plan: - Return in about 4 weeks (around 05/24/2017) for follow up with meter and logs- no labs.  Glade Lloyd, MD Phone: [phone]  Fax: [phone]   04/26/2017, 2:24 PM This note was partially dictated with voice recognition software. Similar sounding words can be transcribed inadequately or may not  be corrected upon review.

## 2017-04-26 NOTE — Patient Instructions (Signed)

## 2017-04-27 ENCOUNTER — Encounter: Payer: Self-pay | Admitting: *Deleted

## 2017-04-29 ENCOUNTER — Encounter: Payer: Self-pay | Admitting: Cardiology

## 2017-04-29 DIAGNOSIS — J449 Chronic obstructive pulmonary disease, unspecified: Secondary | ICD-10-CM | POA: Diagnosis not present

## 2017-04-29 DIAGNOSIS — Z2821 Immunization not carried out because of patient refusal: Secondary | ICD-10-CM | POA: Diagnosis not present

## 2017-04-29 DIAGNOSIS — E1142 Type 2 diabetes mellitus with diabetic polyneuropathy: Secondary | ICD-10-CM | POA: Diagnosis not present

## 2017-04-29 DIAGNOSIS — Z299 Encounter for prophylactic measures, unspecified: Secondary | ICD-10-CM | POA: Diagnosis not present

## 2017-04-29 DIAGNOSIS — I739 Peripheral vascular disease, unspecified: Secondary | ICD-10-CM | POA: Diagnosis not present

## 2017-04-29 DIAGNOSIS — Z6834 Body mass index (BMI) 34.0-34.9, adult: Secondary | ICD-10-CM | POA: Diagnosis not present

## 2017-04-29 DIAGNOSIS — N179 Acute kidney failure, unspecified: Secondary | ICD-10-CM | POA: Diagnosis not present

## 2017-04-29 DIAGNOSIS — E785 Hyperlipidemia, unspecified: Secondary | ICD-10-CM | POA: Diagnosis not present

## 2017-04-29 DIAGNOSIS — I4891 Unspecified atrial fibrillation: Secondary | ICD-10-CM | POA: Diagnosis not present

## 2017-04-29 DIAGNOSIS — I10 Essential (primary) hypertension: Secondary | ICD-10-CM | POA: Diagnosis not present

## 2017-04-29 DIAGNOSIS — I959 Hypotension, unspecified: Secondary | ICD-10-CM | POA: Diagnosis not present

## 2017-04-29 DIAGNOSIS — E1165 Type 2 diabetes mellitus with hyperglycemia: Secondary | ICD-10-CM | POA: Diagnosis not present

## 2017-05-06 ENCOUNTER — Ambulatory Visit (INDEPENDENT_AMBULATORY_CARE_PROVIDER_SITE_OTHER): Payer: PPO | Admitting: *Deleted

## 2017-05-06 DIAGNOSIS — I429 Cardiomyopathy, unspecified: Secondary | ICD-10-CM | POA: Diagnosis not present

## 2017-05-06 DIAGNOSIS — I4892 Unspecified atrial flutter: Secondary | ICD-10-CM

## 2017-05-07 ENCOUNTER — Encounter: Payer: Self-pay | Admitting: Cardiology

## 2017-05-07 NOTE — Progress Notes (Signed)
Remote pacemaker transmission.   

## 2017-05-21 DIAGNOSIS — Z6833 Body mass index (BMI) 33.0-33.9, adult: Secondary | ICD-10-CM | POA: Diagnosis not present

## 2017-05-21 DIAGNOSIS — J209 Acute bronchitis, unspecified: Secondary | ICD-10-CM | POA: Diagnosis not present

## 2017-05-21 DIAGNOSIS — E1165 Type 2 diabetes mellitus with hyperglycemia: Secondary | ICD-10-CM | POA: Diagnosis not present

## 2017-05-21 DIAGNOSIS — I10 Essential (primary) hypertension: Secondary | ICD-10-CM | POA: Diagnosis not present

## 2017-05-21 DIAGNOSIS — I4891 Unspecified atrial fibrillation: Secondary | ICD-10-CM | POA: Diagnosis not present

## 2017-05-21 DIAGNOSIS — Z299 Encounter for prophylactic measures, unspecified: Secondary | ICD-10-CM | POA: Diagnosis not present

## 2017-05-21 DIAGNOSIS — J449 Chronic obstructive pulmonary disease, unspecified: Secondary | ICD-10-CM | POA: Diagnosis not present

## 2017-05-24 ENCOUNTER — Ambulatory Visit (INDEPENDENT_AMBULATORY_CARE_PROVIDER_SITE_OTHER): Payer: PPO | Admitting: "Endocrinology

## 2017-05-24 ENCOUNTER — Encounter: Payer: Self-pay | Admitting: "Endocrinology

## 2017-05-24 VITALS — BP 156/75 | HR 76 | Ht 69.0 in | Wt 217.0 lb

## 2017-05-24 DIAGNOSIS — Z6834 Body mass index (BMI) 34.0-34.9, adult: Secondary | ICD-10-CM | POA: Diagnosis not present

## 2017-05-24 DIAGNOSIS — E1122 Type 2 diabetes mellitus with diabetic chronic kidney disease: Secondary | ICD-10-CM

## 2017-05-24 DIAGNOSIS — N183 Chronic kidney disease, stage 3 (moderate): Secondary | ICD-10-CM | POA: Diagnosis not present

## 2017-05-24 DIAGNOSIS — E6609 Other obesity due to excess calories: Secondary | ICD-10-CM | POA: Diagnosis not present

## 2017-05-24 DIAGNOSIS — E1165 Type 2 diabetes mellitus with hyperglycemia: Secondary | ICD-10-CM

## 2017-05-24 DIAGNOSIS — I10 Essential (primary) hypertension: Secondary | ICD-10-CM | POA: Diagnosis not present

## 2017-05-24 DIAGNOSIS — IMO0002 Reserved for concepts with insufficient information to code with codable children: Secondary | ICD-10-CM

## 2017-05-24 DIAGNOSIS — E782 Mixed hyperlipidemia: Secondary | ICD-10-CM | POA: Diagnosis not present

## 2017-05-24 NOTE — Patient Instructions (Signed)

## 2017-05-24 NOTE — Progress Notes (Signed)
Subjective:    Patient ID: Dylan Tyler, male    DOB: [birth year].  he is being seen in consultation for management of currently uncontrolled symptomatic diabetes requested by  Glenda Chroman, MD.   Past Medical History:  Diagnosis Date  . Ankylosing spondylitis ([place])   . Anxiety   . Aortic root dilatation ([facility])    Mild  . AR (aortic regurgitation)    Mild  . Atrial fibrillation and flutter ([place])   . Bladder cancer ([place])   . Chronic pain    Followed at [facility]  . DDD (degenerative disc disease), lumbosacral   . DDD (degenerative disc disease), thoracolumbar   . Degenerative cervical disc   . Depression   . Diabetes mellitus type II   . Diverticulitis   . Essential hypertension, benign   . GERD (gastroesophageal reflux disease)   . Hyperlipidemia   . Kidney stones   . Migraine   . Nonischemic cardiomyopathy ([place])    Cardiolite 12/09 LVEF 55%, minor luminal irregularities at cardiac catheterization  . OSA on CPAP   . Rheumatoid arthritis ([place])   . Symptomatic bradycardia    Medtronic Advisa L dual-chamber pacemaker  05/2015 - Dr. Curt Bears   Past Surgical History:  Procedure Laterality Date  . CARDIAC CATHETERIZATION  08/13/11  . CARDIOVERSION N/A 07/02/2015   Performed by Sanda Klein, MD at [facility]  . CYSTOSCOPY WITH HOLMIUM LASER LITHOTRIPSY  1990's  . KNEE ARTHROSCOPY Right ~ 1991  . LAPAROSCOPIC CHOLECYSTECTOMY  ~ 2008  . Pacemaker Implant N/A 05/21/2015   Performed by Constance Haw, MD at [facility]  . TRANSURETHRAL RESECTION OF BLADDER TUMOR WITH GYRUS (TURBT-GYRUS)  1990's?   Social History   Socioeconomic History  . Marital status: Divorced    Spouse name: None  . Number of children: None  . Years of education: None  . Highest education level: None  Social Needs  . Financial resource strain: None  . Food insecurity - worry: None  . Food insecurity - inability: None  . Transportation needs - medical: None  . Transportation needs  - non-medical: None  Occupational History  . Occupation: Disabled    Employer: DISABLED  Tobacco Use  . Smoking status: Current Every Day Smoker    Packs/day: 0.30    Years: 50.00    Pack years: 15.00    Types: E-cigarettes    Start date: 02/26/1959    Last attempt to quit: 12/06/2013    Years since quitting: 3.4  . Smokeless tobacco: Former Systems developer    Types: Chew  . Tobacco comment: 05/21/2015 "quit chewing in 1992"  Substance and Sexual Activity  . Alcohol use: Yes    Alcohol/week: 1.2 oz    Types: 2 Cans of beer per week  . Drug use: Yes    Types: Cocaine    Comment: 05/21/2015 "quit in the early 2000"  . Sexual activity: None  Other Topics Concern  . None  Social History Narrative   Divorced   No regular exercise   Outpatient Encounter Medications as of 05/24/2017  Medication Sig  . insulin degludec (TRESIBA) 100 UNIT/ML SOPN FlexTouch Pen Inject 50 Units daily at 10 pm into the skin.  Marland Kitchen albuterol (PROVENTIL) (2.5 MG/3ML) 0.083% nebulizer solution Take 2.5 mg by nebulization every 6 (six) hours as needed for wheezing.  Marland Kitchen apixaban (ELIQUIS) 5 MG TABS tablet Take 1 tablet (5 mg total) by mouth 2 (two) times daily.  . carvedilol (COREG)  6.25 MG tablet Take 1 tablet (6.25 mg total) by mouth 2 (two) times daily with a meal.  . Cholecalciferol (VITAMIN D-3) 1000 UNITS CAPS Take 1,000 Units by mouth 2 (two) times daily.   . cholestyramine (QUESTRAN) 4 g packet Take 4 g by mouth 3 (three) times daily with meals.  . citalopram (CELEXA) 40 MG tablet Take 20-40 mg by mouth daily.   Marland Kitchen gabapentin (NEURONTIN) 400 MG capsule Take 400 mg by mouth 3 (three) times daily.   Marland Kitchen ibuprofen (ADVIL,MOTRIN) 200 MG tablet Take 400 mg by mouth every 6 (six) hours as needed for headache or moderate pain.  Marland Kitchen insulin aspart (NOVOLOG FLEXPEN) 100 UNIT/ML FlexPen Inject 10-16 Units into the skin 3 (three) times daily with meals.  . Insulin Pen Needle (B-D ULTRAFINE III SHORT PEN) 31G X 8 MM MISC Use to inject  insulin.  Marland Kitchen ipratropium-albuterol (DUONEB) 0.5-2.5 (3) MG/3ML SOLN Take 3 mLs by nebulization every 4 (four) hours as needed (for shortness of breath).  . methylPREDNISolone (MEDROL DOSEPAK) 4 MG TBPK tablet Take by mouth.  . metoCLOPramide (REGLAN) 5 MG tablet Take 5 mg by mouth every 6 (six) hours as needed for nausea or vomiting.   . nitroGLYCERIN (NITROSTAT) 0.4 MG SL tablet Place 1 tablet (0.4 mg total) under the tongue every 5 (five) minutes as needed for chest pain (up to 3 doses. If no relief after the 3 rd dose, proceed to the ED for an evaluation). May repeat for up to 3 doses.  . NON FORMULARY 1 each by Other route See admin instructions. CPAP - Uses nightly  . simvastatin (ZOCOR) 40 MG tablet Take 1 tablet (40 mg total) by mouth at bedtime.  . vitamin B-12 (CYANOCOBALAMIN) 1000 MCG tablet Take 1,000 mcg by mouth 2 (two) times daily.    . VOLTAREN 1 % GEL Apply 2 g topically 4 (four) times daily as needed (for pain).   . [DISCONTINUED] Insulin Glargine (TOUJEO MAX SOLOSTAR ) Inject 50 Units into the skin at bedtime.   No facility-administered encounter medications on file as of 05/24/2017.     ALLERGIES: No Known Allergies  VACCINATION STATUS:  There is no immunization history on file for this patient.  Diabetes  He presents for his follow-up diabetic visit. He has type 2 diabetes mellitus. Onset time: He was diagnosed at approximate age of 71 years. His disease course has been stable (His most recent A1c was 9.8% on 01/12/2017.). There are no hypoglycemic associated symptoms. Pertinent negatives for hypoglycemia include no confusion, headaches, pallor or seizures. Associated symptoms include foot paresthesias, polydipsia and polyuria. Pertinent negatives for diabetes include no chest pain, no fatigue, no polyphagia and no weakness. There are no hypoglycemic complications. Symptoms are stable. Diabetic complications include peripheral neuropathy. Risk factors for coronary artery  disease include dyslipidemia, diabetes mellitus, hypertension, male sex, sedentary lifestyle, tobacco exposure and family history. Current diabetic treatment includes oral agent (dual therapy). Compliance with diabetes treatment: She admits to dietary indiscretion consuming large quantities of processed  including soda, however he promises to change that. Is currently recovering from upper aspect Infection. His weight is stable. He is following a generally unhealthy diet. When asked about meal planning, he reported none. He has not had a previous visit with a dietitian. He never (He is wheelchair-bound due to deconditioning, and due todegenerative joint disease.) participates in exercise. (He came with no meter nor logs to review today.) An ACE inhibitor/angiotensin II receptor blocker is being taken. He sees a  podiatrist. Hyperlipidemia  This is a chronic problem. The current episode started more than 1 year ago. Exacerbating diseases include diabetes and obesity. Factors aggravating his hyperlipidemia include smoking. Pertinent negatives include no chest pain, myalgias or shortness of breath. Current antihyperlipidemic treatment includes statins. Risk factors for coronary artery disease include dyslipidemia, diabetes mellitus, hypertension, a sedentary lifestyle, male sex and obesity.  Hypertension  This is a chronic problem. The current episode started more than 1 month ago. Pertinent negatives include no chest pain, headaches, neck pain, palpitations or shortness of breath. Risk factors for coronary artery disease include diabetes mellitus, dyslipidemia, male gender, obesity, sedentary lifestyle and smoking/tobacco exposure. Past treatments include ACE inhibitors.     Review of Systems  Constitutional: Negative for chills, fatigue, fever and unexpected weight change.  HENT: Negative for dental problem, mouth sores and trouble swallowing.   Eyes: Negative for visual disturbance.  Respiratory: Positive  for cough. Negative for choking, chest tightness, shortness of breath and wheezing.        Wears nasal mask due to coughing.  Cardiovascular: Negative for chest pain, palpitations and leg swelling.  Gastrointestinal: Negative for abdominal distention, abdominal pain, constipation, diarrhea, nausea and vomiting.  Endocrine: Positive for polydipsia and polyuria. Negative for polyphagia.  Genitourinary: Negative for dysuria, flank pain, hematuria and urgency.  Musculoskeletal: Positive for gait problem. Negative for back pain, myalgias and neck pain.       Walks with a walker.  Skin: Negative for pallor, rash and wound.  Neurological: Negative for seizures, syncope, weakness, numbness and headaches.  Psychiatric/Behavioral: Negative for confusion and dysphoric mood.    Objective:    BP (!) 156/75   Pulse 76   Ht 5\' 9"  (1.753 m)   Wt 217 lb (98.4 kg)   BMI 32.05 kg/m   Wt Readings from Last 3 Encounters:  05/24/17 217 lb (98.4 kg)  04/26/17 223 lb (101.2 kg)  03/31/17 221 lb (100.2 kg)     Physical Exam  Constitutional: He is oriented to person, place, and time. He appears well-developed and well-nourished. He is cooperative. No distress.  HENT:  Head: Normocephalic and atraumatic.  Eyes: EOM are normal.  Neck: Normal range of motion. Neck supple. No tracheal deviation present. No thyromegaly present.  Cardiovascular: Normal rate, S1 normal, S2 normal and normal heart sounds. Exam reveals no gallop.  No murmur heard. Pulses:      Dorsalis pedis pulses are 1+ on the right side, and 1+ on the left side.       Posterior tibial pulses are 1+ on the right side, and 1+ on the left side.  Pulmonary/Chest: Breath sounds normal. No respiratory distress. He has no wheezes.  Abdominal: Soft. Bowel sounds are normal. He exhibits no distension. There is no tenderness. There is no guarding and no CVA tenderness.  Musculoskeletal: He exhibits no edema.       Right shoulder: He exhibits no  swelling and no deformity.  Uses walker to get around.  Neurological: He is alert and oriented to person, place, and time. He has normal strength and normal reflexes. No cranial nerve deficit or sensory deficit. Gait normal.  Skin: Skin is warm and dry. No rash noted. No cyanosis. Nails show no clubbing.  Psychiatric: He has a normal mood and affect. His speech is normal. Cognition and memory are normal.    CMP     Component Value Date/Time   NA 137 05/21/2015 0848   K 4.2 05/21/2015 0848   CL  103 05/21/2015 0848   CO2 22 05/21/2015 0848   GLUCOSE 162 (H) 05/21/2015 0848   BUN 24 (A) 04/16/2017   CREATININE 1.7 (A) 04/16/2017   CREATININE 1.13 05/21/2015 0848   CREATININE 1.29 (H) 04/12/2015 1609   CALCIUM 9.6 05/21/2015 0848   GFRNONAA >60 05/21/2015 0848   GFRAA >60 05/21/2015 0848     Recent Results (from the past 2160 hour(s))  Basic metabolic panel     Status: Abnormal   Collection Time: 04/16/17 12:00 AM  Result Value Ref Range   BUN 24 (A) 4 - 21   Creatinine 1.7 (A) 0.6 - 1.3  Hemoglobin A1c     Status: None   Collection Time: 04/16/17 12:00 AM  Result Value Ref Range   Hemoglobin A1C 10.1       Assessment & Plan:   1. Type 2 diabetes mellitus with stage 3 chronic kidney disease, without long-term current use of insulin ([place])  - Patient has currently uncontrolled symptomatic type 2 DM since  71 years of age. -Patient came without any meter nor logs. His recent A1c was high at 10.1%. -his diabetes is complicated by stage 3 renal insufficiency, obesity/sedentary life, neuropathy, cardiomyopathy and BUNNY LOWDERMILK remains at a high risk for more acute and chronic complications which include CAD, CVA, CKD, retinopathy, and neuropathy. These are all discussed in detail with the patient.  - I have counseled him on diet management and weight loss, by adopting a carbohydrate restricted/protein rich diet.  -  Suggestion is made for him to avoid simple carbohydrates   from his diet including Cakes, Sweet Desserts / Pastries, Ice Cream, Soda (diet and regular), Sweet Tea, Candies, Chips, Cookies, Store Bought Juices, Alcohol in Excess of  1-2 drinks a day, Artificial Sweeteners, and "Sugar-free" Products. This will help patient to have stable blood glucose profile and potentially avoid unintended weight gain.  - I encouraged him to switch to  unprocessed or minimally processed complex starch and increased protein intake (animal or plant source), fruits, and vegetables.  - he is advised to stick to a routine mealtimes to eat 3 meals  a day and avoid unnecessary snacks ( to snack only to correct hypoglycemia).   - I have approached him with the following individualized plan to manage diabetes and patient agrees:   - Based on his glucose profile, he will need at basal /bolus  Insulin for treatment of diabetes to target.   -Currently, he is dealing with a gap in his insurance coverage struggling to pay for medications.   -He is receiving samples of basal insulin ,  Tyler Aas .  I will increase to 50 units nightly,  continue NovoLog 10 units 3 times daily before meals for pre-meal blood glucose readings above 90 mg/dL (with additional correction for readings above 150 mg/dL)  associated with strict monitoring of blood glucose 4 times a day- before meals and at bedtime. - He is advised to stay committed for proper monitoring for safe use of insulin.  -Patient is encouraged to call clinic for blood glucose levels less than 70 or above 300 mg /dl. - I will disontinue metformin  due to CKD. - He  will be considered for incretin therapy when he gets appropriate insurance coverage.  - Patient specific target  A1c;  LDL, HDL, Triglycerides, and  Waist Circumference were discussed in detail.  2) BP/HTN: Controlled.  Continue current medications including ACEI/ARB. 3) Lipids/HPL:   Lipid panel unknown.   Patient is advised  to continue statins. 4)  Weight/Diet: CDE Consult has  been initiated , exercise, and detailed carbohydrates information provided.  5) Chronic Care/Health Maintenance:  -he  is on ACEI/ARB and Statin medications and  is encouraged to continue to follow up with Ophthalmology, Dentist,  Podiatrist at least yearly or according to recommendations, and advised to  stay away from smoking. I have recommended yearly flu vaccine and pneumonia vaccination at least every 5 years; and  sleep for at least 7 hours a day.  - Time spent with the patient: 25 min, of which >50% was spent in reviewing his sugar logs , discussing his hypo- and hyper-glycemic episodes, reviewing his current and  previous labs and insulin doses and developing a plan to avoid hypo- and hyper-glycemia.   - I advised patient to maintain close follow up with Glenda Chroman, MD for primary care needs.  Follow up plan: - Return in about 4 weeks (around 06/21/2017) for meter, and logs, follow up with meter and logs- no labs.  Glade Lloyd, MD Phone: [phone]  Fax: [phone]   05/24/2017, 3:57 PM This note was partially dictated with voice recognition software. Similar sounding words can be transcribed inadequately or may not  be corrected upon review.

## 2017-06-02 LAB — CUP PACEART REMOTE DEVICE CHECK
Battery Voltage: 3 V
Brady Statistic RA Percent Paced: 0 %
Implantable Pulse Generator Implant Date: 20161115
Lead Channel Impedance Value: 304 Ohm
Lead Channel Pacing Threshold Amplitude: 0.5 V
Lead Channel Pacing Threshold Pulse Width: 0.4 ms
Lead Channel Pacing Threshold Pulse Width: 0.4 ms
Lead Channel Sensing Intrinsic Amplitude: 1.25 mV
Lead Channel Sensing Intrinsic Amplitude: 7 mV
MDC IDC MSMT BATTERY REMAINING LONGEVITY: 66 mo
MDC IDC MSMT LEADCHNL RA IMPEDANCE VALUE: 437 Ohm
MDC IDC MSMT LEADCHNL RA SENSING INTR AMPL: 1.25 mV
MDC IDC MSMT LEADCHNL RV IMPEDANCE VALUE: 323 Ohm
MDC IDC MSMT LEADCHNL RV IMPEDANCE VALUE: 399 Ohm
MDC IDC MSMT LEADCHNL RV PACING THRESHOLD AMPLITUDE: 0.75 V
MDC IDC MSMT LEADCHNL RV SENSING INTR AMPL: 7 mV
MDC IDC SESS DTM: 20181101194107
MDC IDC SET LEADCHNL RV PACING AMPLITUDE: 2.5 V
MDC IDC SET LEADCHNL RV PACING PULSEWIDTH: 0.4 ms
MDC IDC SET LEADCHNL RV SENSING SENSITIVITY: 2.8 mV
MDC IDC STAT BRADY AP VP PERCENT: 0 %
MDC IDC STAT BRADY AP VS PERCENT: 0 %
MDC IDC STAT BRADY AS VP PERCENT: 96.33 %
MDC IDC STAT BRADY AS VS PERCENT: 3.67 %
MDC IDC STAT BRADY RV PERCENT PACED: 97.54 %

## 2017-06-07 ENCOUNTER — Telehealth: Payer: Self-pay | Admitting: Cardiology

## 2017-06-07 NOTE — Telephone Encounter (Signed)
Patient called requesting samples of Eliquis please call [phone]

## 2017-06-08 MED ORDER — APIXABAN 5 MG PO TABS: 5.0000 mg | ORAL_TABLET | Freq: Two times a day (BID) | ORAL | 0 refills | Status: DC

## 2017-06-08 NOTE — Telephone Encounter (Signed)
Mardene Celeste notified samples ready for pick up.

## 2017-06-09 DIAGNOSIS — L723 Sebaceous cyst: Secondary | ICD-10-CM | POA: Diagnosis not present

## 2017-06-09 DIAGNOSIS — L570 Actinic keratosis: Secondary | ICD-10-CM | POA: Diagnosis not present

## 2017-06-09 DIAGNOSIS — L308 Other specified dermatitis: Secondary | ICD-10-CM | POA: Diagnosis not present

## 2017-06-24 ENCOUNTER — Ambulatory Visit (INDEPENDENT_AMBULATORY_CARE_PROVIDER_SITE_OTHER): Payer: PPO | Admitting: "Endocrinology

## 2017-06-24 ENCOUNTER — Encounter: Payer: Self-pay | Admitting: "Endocrinology

## 2017-06-24 VITALS — BP 154/84 | HR 85 | Ht 69.0 in | Wt 231.0 lb

## 2017-06-24 DIAGNOSIS — E1122 Type 2 diabetes mellitus with diabetic chronic kidney disease: Secondary | ICD-10-CM | POA: Diagnosis not present

## 2017-06-24 DIAGNOSIS — I10 Essential (primary) hypertension: Secondary | ICD-10-CM | POA: Diagnosis not present

## 2017-06-24 DIAGNOSIS — IMO0002 Reserved for concepts with insufficient information to code with codable children: Secondary | ICD-10-CM

## 2017-06-24 DIAGNOSIS — N183 Chronic kidney disease, stage 3 (moderate): Secondary | ICD-10-CM | POA: Diagnosis not present

## 2017-06-24 DIAGNOSIS — E782 Mixed hyperlipidemia: Secondary | ICD-10-CM | POA: Diagnosis not present

## 2017-06-24 DIAGNOSIS — E1165 Type 2 diabetes mellitus with hyperglycemia: Secondary | ICD-10-CM

## 2017-06-24 NOTE — Progress Notes (Signed)
Subjective:    Patient ID: Dylan Tyler, male    DOB: [birth year].  he is being seen in consultation for management of currently uncontrolled symptomatic diabetes requested by  Glenda Chroman, MD.   Past Medical History:  Diagnosis Date  . Ankylosing spondylitis ([place])   . Anxiety   . Aortic root dilatation (H[facility])    Mild  . AR (aortic regurgitation)    Mild  . Atrial fibrillation and flutter ([place])   . Bladder cancer ([place])   . Chronic pain    Followed at [facility]  . DDD (degenerative disc disease), lumbosacral   . DDD (degenerative disc disease), thoracolumbar   . Degenerative cervical disc   . Depression   . Diabetes mellitus type II   . Diverticulitis   . Essential hypertension, benign   . GERD (gastroesophageal reflux disease)   . Hyperlipidemia   . Kidney stones   . Migraine   . Nonischemic cardiomyopathy ([place])    Cardiolite 12/09 LVEF 55%, minor luminal irregularities at cardiac catheterization  . OSA on CPAP   . Rheumatoid arthritis ([place])   . Symptomatic bradycardia    Medtronic Advisa L dual-chamber pacemaker  05/2015 - Dr. Curt Bears   Past Surgical History:  Procedure Laterality Date  . CARDIAC CATHETERIZATION  08/13/11  . CARDIOVERSION N/A 07/02/2015   Procedure: CARDIOVERSION;  Surgeon: Sanda Klein, MD;  Location: [facility];  Service: Cardiovascular;  Laterality: N/A;  . CYSTOSCOPY WITH HOLMIUM LASER LITHOTRIPSY  1990's  . EP IMPLANTABLE DEVICE N/A 05/21/2015   MDT Advisa DR MRI pacemaker implanted by Dr Curt Bears  . KNEE ARTHROSCOPY Right ~ 1991  . LAPAROSCOPIC CHOLECYSTECTOMY  ~ 2008  . TRANSURETHRAL RESECTION OF BLADDER TUMOR WITH GYRUS (TURBT-GYRUS)  1990's?   Social History   Socioeconomic History  . Marital status: Divorced    Spouse name: None  . Number of children: None  . Years of education: None  . Highest education level: None  Social Needs  . Financial resource strain: None  . Food insecurity - worry: None  . Food insecurity - inability:  None  . Transportation needs - medical: None  . Transportation needs - non-medical: None  Occupational History  . Occupation: Disabled    Employer: DISABLED  Tobacco Use  . Smoking status: Current Every Day Smoker    Packs/day: 0.30    Years: 50.00    Pack years: 15.00    Types: E-cigarettes    Start date: 02/26/1959    Last attempt to quit: 12/06/2013    Years since quitting: 3.5  . Smokeless tobacco: Former Systems developer    Types: Chew  . Tobacco comment: 05/21/2015 "quit chewing in 1992"  Substance and Sexual Activity  . Alcohol use: Yes    Alcohol/week: 1.2 oz    Types: 2 Cans of beer per week  . Drug use: Yes    Types: Cocaine    Comment: 05/21/2015 "quit in the early 2000"  . Sexual activity: None  Other Topics Concern  . None  Social History Narrative   Divorced   No regular exercise   Outpatient Encounter Medications as of 06/24/2017  Medication Sig  . insulin aspart (NOVOLOG) 100 UNIT/ML injection Inject 15-21 Units into the skin 3 (three) times daily before meals.  Marland Kitchen albuterol (PROVENTIL) (2.5 MG/3ML) 0.083% nebulizer solution Take 2.5 mg by nebulization every 6 (six) hours as needed for wheezing.  Marland Kitchen apixaban (ELIQUIS) 5 MG TABS tablet Take 1 tablet (5 mg total) by mouth  2 (two) times daily.  . carvedilol (COREG) 6.25 MG tablet Take 1 tablet (6.25 mg total) by mouth 2 (two) times daily with a meal.  . Cholecalciferol (VITAMIN D-3) 1000 UNITS CAPS Take 1,000 Units by mouth 2 (two) times daily.   . cholestyramine (QUESTRAN) 4 g packet Take 4 g by mouth 3 (three) times daily with meals.  . citalopram (CELEXA) 40 MG tablet Take 20-40 mg by mouth daily.   Marland Kitchen gabapentin (NEURONTIN) 400 MG capsule Take 400 mg by mouth 3 (three) times daily.   Marland Kitchen ibuprofen (ADVIL,MOTRIN) 200 MG tablet Take 400 mg by mouth every 6 (six) hours as needed for headache or moderate pain.  Marland Kitchen insulin degludec (TRESIBA) 100 UNIT/ML SOPN FlexTouch Pen Inject 70 Units into the skin daily at 10 pm.  . Insulin Pen  Needle (B-D ULTRAFINE III SHORT PEN) 31G X 8 MM MISC Use to inject insulin.  Marland Kitchen ipratropium-albuterol (DUONEB) 0.5-2.5 (3) MG/3ML SOLN Take 3 mLs by nebulization every 4 (four) hours as needed (for shortness of breath).  . methylPREDNISolone (MEDROL DOSEPAK) 4 MG TBPK tablet Take by mouth.  . metoCLOPramide (REGLAN) 5 MG tablet Take 5 mg by mouth every 6 (six) hours as needed for nausea or vomiting.   . nitroGLYCERIN (NITROSTAT) 0.4 MG SL tablet Place 1 tablet (0.4 mg total) under the tongue every 5 (five) minutes as needed for chest pain (up to 3 doses. If no relief after the 3 rd dose, proceed to the ED for an evaluation). May repeat for up to 3 doses.  . NON FORMULARY 1 each by Other route See admin instructions. CPAP - Uses nightly  . simvastatin (ZOCOR) 40 MG tablet Take 1 tablet (40 mg total) by mouth at bedtime.  . vitamin B-12 (CYANOCOBALAMIN) 1000 MCG tablet Take 1,000 mcg by mouth 2 (two) times daily.    . VOLTAREN 1 % GEL Apply 2 g topically 4 (four) times daily as needed (for pain).   . [DISCONTINUED] insulin aspart (NOVOLOG FLEXPEN) 100 UNIT/ML FlexPen Inject 10-16 Units into the skin 3 (three) times daily with meals.   No facility-administered encounter medications on file as of 06/24/2017.     ALLERGIES: No Known Allergies  VACCINATION STATUS:  There is no immunization history on file for this patient.  Diabetes  He presents for his follow-up diabetic visit. He has type 2 diabetes mellitus. Onset time: He was diagnosed at approximate age of 71 years. His disease course has been worsening (His most recent A1c was 9.8% on 01/12/2017.). There are no hypoglycemic associated symptoms. Pertinent negatives for hypoglycemia include no confusion, headaches, pallor or seizures. Associated symptoms include foot paresthesias, polydipsia and polyuria. Pertinent negatives for diabetes include no chest pain, no fatigue, no polyphagia and no weakness. There are no hypoglycemic complications.  Symptoms are worsening. Diabetic complications include peripheral neuropathy. Risk factors for coronary artery disease include dyslipidemia, diabetes mellitus, hypertension, male sex, sedentary lifestyle, tobacco exposure and family history. Current diabetic treatment includes oral agent (dual therapy). Compliance with diabetes treatment: She admits to dietary indiscretion consuming large quantities of processed  including soda, however he promises to change that. Is currently recovering from upper aspect Infection. His weight is increasing steadily. He is following a generally unhealthy diet. When asked about meal planning, he reported none. He has not had a previous visit with a dietitian. He never (He is wheelchair-bound due to deconditioning, and due todegenerative joint disease.) participates in exercise. His breakfast blood glucose range is generally >200 mg/dl.  His lunch blood glucose range is generally >200 mg/dl. His dinner blood glucose range is generally >200 mg/dl. His bedtime blood glucose range is generally >200 mg/dl. His overall blood glucose range is >200 mg/dl. (He came with significantly above target blood glucose profile.) An ACE inhibitor/angiotensin II receptor blocker is being taken. He sees a podiatrist. Hyperlipidemia  This is a chronic problem. The current episode started more than 1 year ago. Exacerbating diseases include diabetes and obesity. Factors aggravating his hyperlipidemia include smoking. Pertinent negatives include no chest pain, myalgias or shortness of breath. Current antihyperlipidemic treatment includes statins. Risk factors for coronary artery disease include dyslipidemia, diabetes mellitus, hypertension, a sedentary lifestyle, male sex and obesity.  Hypertension  This is a chronic problem. The current episode started more than 1 month ago. Pertinent negatives include no chest pain, headaches, neck pain, palpitations or shortness of breath. Risk factors for coronary  artery disease include diabetes mellitus, dyslipidemia, male gender, obesity, sedentary lifestyle and smoking/tobacco exposure. Past treatments include ACE inhibitors.    Review of Systems  Constitutional: Negative for chills, fatigue, fever and unexpected weight change.  HENT: Negative for dental problem, mouth sores and trouble swallowing.   Eyes: Negative for visual disturbance.  Respiratory: Positive for cough. Negative for choking, chest tightness, shortness of breath and wheezing.        Wears nasal mask due to coughing.  Cardiovascular: Negative for chest pain, palpitations and leg swelling.  Gastrointestinal: Negative for abdominal distention, abdominal pain, constipation, diarrhea, nausea and vomiting.  Endocrine: Positive for polydipsia and polyuria. Negative for polyphagia.  Genitourinary: Negative for dysuria, flank pain, hematuria and urgency.  Musculoskeletal: Positive for gait problem. Negative for back pain, myalgias and neck pain.       Walks with a walker.  Skin: Negative for pallor, rash and wound.  Neurological: Negative for seizures, syncope, weakness, numbness and headaches.  Psychiatric/Behavioral: Negative for confusion and dysphoric mood.    Objective:    BP (!) 154/84   Pulse 85   Ht 5\' 9"  (1.753 m)   Wt 231 lb (104.8 kg)   BMI 34.11 kg/m   Wt Readings from Last 3 Encounters:  06/24/17 231 lb (104.8 kg)  05/24/17 217 lb (98.4 kg)  04/26/17 223 lb (101.2 kg)     Physical Exam  Constitutional: He is oriented to person, place, and time. He appears well-developed and well-nourished. He is cooperative. No distress.  HENT:  Head: Normocephalic and atraumatic.  Eyes: EOM are normal.  Neck: Normal range of motion. Neck supple. No tracheal deviation present. No thyromegaly present.  Cardiovascular: Normal rate, S1 normal, S2 normal and normal heart sounds. Exam reveals no gallop.  No murmur heard. Pulses:      Dorsalis pedis pulses are 1+ on the right side,  and 1+ on the left side.       Posterior tibial pulses are 1+ on the right side, and 1+ on the left side.  Pulmonary/Chest: Breath sounds normal. No respiratory distress. He has no wheezes.  Abdominal: Soft. Bowel sounds are normal. He exhibits no distension. There is no tenderness. There is no guarding and no CVA tenderness.  Musculoskeletal: He exhibits no edema.       Right shoulder: He exhibits no swelling and no deformity.  Uses walker to get around.  Neurological: He is alert and oriented to person, place, and time. He has normal strength and normal reflexes. No cranial nerve deficit or sensory deficit. Gait normal.  Skin: Skin is warm  and dry. No rash noted. No cyanosis. Nails show no clubbing.  Psychiatric: He has a normal mood and affect. His speech is normal. Cognition and memory are normal.    CMP     Component Value Date/Time   NA 137 05/21/2015 0848   K 4.2 05/21/2015 0848   CL 103 05/21/2015 0848   CO2 22 05/21/2015 0848   GLUCOSE 162 (H) 05/21/2015 0848   BUN 24 (A) 04/16/2017   CREATININE 1.7 (A) 04/16/2017   CREATININE 1.13 05/21/2015 0848   CREATININE 1.29 (H) 04/12/2015 1609   CALCIUM 9.6 05/21/2015 0848   GFRNONAA >60 05/21/2015 0848   GFRAA >60 05/21/2015 0848     Recent Results (from the past 2160 hour(s))  Basic metabolic panel     Status: Abnormal   Collection Time: 04/16/17 12:00 AM  Result Value Ref Range   BUN 24 (A) 4 - 21   Creatinine 1.7 (A) 0.6 - 1.3  Hemoglobin A1c     Status: None   Collection Time: 04/16/17 12:00 AM  Result Value Ref Range   Hemoglobin A1C 10.1   CUP PACEART REMOTE DEVICE CHECK     Status: None   Collection Time: 05/06/17  7:41 PM  Result Value Ref Range   Date Time Interrogation Session [id number]    Pulse Generator Manufacturer MERM    Pulse Gen Model [id number] Advisa DR MRI    Pulse Gen Serial Number [id number]    Clinic Name [facility]    Implantable Pulse Generator Type Implantable Pulse Generator     Implantable Pulse Generator Implant Date [id number]    Lead Channel Setting Sensing Sensitivity 2.8 mV   Lead Channel Setting Pacing Pulse Width 0.4 ms   Lead Channel Setting Pacing Amplitude 2.5 V   Lead Channel Impedance Value 437 ohm   Lead Channel Impedance Value 304 ohm   Lead Channel Sensing Intrinsic Amplitude 1.25 mV   Lead Channel Sensing Intrinsic Amplitude 1.25 mV   Lead Channel Pacing Threshold Amplitude 0.5 V   Lead Channel Pacing Threshold Pulse Width 0.4 ms   Lead Channel Impedance Value 399 ohm   Lead Channel Impedance Value 323 ohm   Lead Channel Sensing Intrinsic Amplitude 7 mV   Lead Channel Sensing Intrinsic Amplitude 7 mV   Lead Channel Pacing Threshold Amplitude 0.75 V   Lead Channel Pacing Threshold Pulse Width 0.4 ms   Battery Status OK    Battery Remaining Longevity 66 mo   Battery Voltage 3.00 V   Brady Statistic RA Percent Paced 0 %   Brady Statistic RV Percent Paced 97.54 %   Brady Statistic AP VP Percent 0 %   Brady Statistic AS VP Percent 96.33 %   Brady Statistic AP VS Percent 0 %   Brady Statistic AS VS Percent 3.67 %   Eval Rhythm AF/Vp       Assessment & Plan:   1. Type 2 diabetes mellitus with stage 3 chronic kidney disease, without long-term current use of insulin ([place])  - Patient has currently uncontrolled symptomatic type 2 DM since  71 years of age. -Patient came with significantly above target blood glucose profile despite large dose of insulin. His recent A1c was high at 10.1%. -his diabetes is complicated by stage 3 renal insufficiency, obesity/sedentary life, neuropathy, cardiomyopathy, chronic heavy smoking, inadequate insurance and BIJON MINEER remains at a high risk for more acute and chronic complications which include CAD, CVA, CKD, retinopathy, and neuropathy. These are all discussed in detail  with the patient.  - I have counseled him on diet management and weight loss, by adopting a carbohydrate restricted/protein rich diet.  -   Suggestion is made for him to avoid simple carbohydrates  from his diet including Cakes, Sweet Desserts / Pastries, Ice Cream, Soda (diet and regular), Sweet Tea, Candies, Chips, Cookies, Store Bought Juices, Alcohol in Excess of  1-2 drinks a day, Artificial Sweeteners, and "Sugar-free" Products. This will help patient to have stable blood glucose profile and potentially avoid unintended weight gain.   - I encouraged him to switch to  unprocessed or minimally processed complex starch and increased protein intake (animal or plant source), fruits, and vegetables.  - he is advised to stick to a routine mealtimes to eat 3 meals  a day and avoid unnecessary snacks ( to snack only to correct hypoglycemia).   - I have approached him with the following individualized plan to manage diabetes and patient agrees:   - Based on his glucose profile, he will  Need even higher dose of insulin in the basal /bolus  fashion for him to achieve control of diabetes to target.  -Currently, he is dealing with a gap in his insurance coverage struggling to pay for medications.   -He is receiving samples of basal insulin ,  Tyler Aas .  I will increase to 70 units nightly,  (samples provided for  Fiasp 15 units 3 times daily before meals for pre-meal blood glucose readings above 90 mg/dL (with additional correction for readings above 150 mg/dL)  associated with strict monitoring of blood glucose 4 times a day- before meals and at bedtime. - He is advised to stay committed for proper monitoring for safe use of insulin.  -Patient is encouraged to call clinic for blood glucose levels less than 70 or above 300 mg /dl. - He is not a candidate for metformin therapy due to CKD.  - He  will be considered for incretin therapy when he gets appropriate insurance coverage.  - Patient specific target  A1c;  LDL, HDL, Triglycerides, and  Waist Circumference were discussed in detail.  2) BP/HTN: uncontrolled. I advised him to continue  current blood pressure medications including  ACEI/ARB. 3) Lipids/HPL:   Lipid panel unknown.   Patient is advised to continue statins. 4)  Weight/Diet: CDE Consult has been initiated , exercise, and detailed carbohydrates information provided.  5) Chronic Care/Health Maintenance:  -he  is on ACEI/ARB and Statin medications and  is encouraged to continue to follow up with Ophthalmology, Dentist,  Podiatrist at least yearly or according to recommendations, and advised to  stay away from smoking. I have recommended yearly flu vaccine and pneumonia vaccination at least every 5 years; and  sleep for at least 7 hours a day.  - Time spent with the patient: 25 min, of which >50% was spent in reviewing his sugar logs , discussing his hypo- and hyper-glycemic episodes, reviewing his current and  previous labs and insulin doses and developing a plan to avoid hypo- and hyper-glycemia.    - I advised patient to maintain close follow up with Glenda Chroman, MD for primary care needs.  Follow up plan: - Return in about 8 weeks (around 08/19/2017) for follow up with pre-visit labs, meter, and logs.  Glade Lloyd, MD Phone: [phone]  Fax: [phone]   06/24/2017, 5:26 PM This note was partially dictated with voice recognition software. Similar sounding words can be transcribed inadequately or may not  be corrected upon review.

## 2017-06-24 NOTE — Patient Instructions (Signed)

## 2017-06-25 ENCOUNTER — Other Ambulatory Visit: Payer: Self-pay

## 2017-06-25 MED ORDER — INSULIN PEN NEEDLE 31G X 8 MM MISC: 3 refills | Status: DC

## 2017-08-05 ENCOUNTER — Ambulatory Visit (INDEPENDENT_AMBULATORY_CARE_PROVIDER_SITE_OTHER): Payer: PPO | Admitting: *Deleted

## 2017-08-05 DIAGNOSIS — I4892 Unspecified atrial flutter: Secondary | ICD-10-CM

## 2017-08-05 DIAGNOSIS — R001 Bradycardia, unspecified: Secondary | ICD-10-CM

## 2017-08-05 DIAGNOSIS — Z950 Presence of cardiac pacemaker: Secondary | ICD-10-CM

## 2017-08-05 NOTE — Progress Notes (Signed)
Remote pacemaker transmission.   

## 2017-08-06 ENCOUNTER — Encounter: Payer: Self-pay | Admitting: Cardiology

## 2017-08-19 ENCOUNTER — Ambulatory Visit (INDEPENDENT_AMBULATORY_CARE_PROVIDER_SITE_OTHER): Payer: PPO | Admitting: "Endocrinology

## 2017-08-19 ENCOUNTER — Encounter: Payer: Self-pay | Admitting: "Endocrinology

## 2017-08-19 VITALS — BP 145/74 | HR 84 | Ht 69.0 in | Wt 238.0 lb

## 2017-08-19 DIAGNOSIS — I10 Essential (primary) hypertension: Secondary | ICD-10-CM

## 2017-08-19 DIAGNOSIS — Z6834 Body mass index (BMI) 34.0-34.9, adult: Secondary | ICD-10-CM | POA: Diagnosis not present

## 2017-08-19 DIAGNOSIS — E782 Mixed hyperlipidemia: Secondary | ICD-10-CM

## 2017-08-19 DIAGNOSIS — N183 Chronic kidney disease, stage 3 (moderate): Secondary | ICD-10-CM | POA: Diagnosis not present

## 2017-08-19 DIAGNOSIS — E1122 Type 2 diabetes mellitus with diabetic chronic kidney disease: Secondary | ICD-10-CM

## 2017-08-19 DIAGNOSIS — E6609 Other obesity due to excess calories: Secondary | ICD-10-CM | POA: Diagnosis not present

## 2017-08-19 DIAGNOSIS — E1165 Type 2 diabetes mellitus with hyperglycemia: Secondary | ICD-10-CM

## 2017-08-19 DIAGNOSIS — IMO0002 Reserved for concepts with insufficient information to code with codable children: Secondary | ICD-10-CM

## 2017-08-19 MED ORDER — INSULIN ASPART 100 UNIT/ML FLEXPEN: 18.0000 [IU] | PEN_INJECTOR | Freq: Three times a day (TID) | SUBCUTANEOUS | 2 refills | Status: DC

## 2017-08-19 NOTE — Progress Notes (Signed)
Subjective:    Patient ID: Dylan Tyler, male    DOB: 07/28/1945.  he is being seen in consultation for management of currently uncontrolled symptomatic diabetes requested by  Glenda Chroman, MD.   Past Medical History:  Diagnosis Date  . Ankylosing spondylitis ([place])   . Anxiety   . Aortic root dilatation ([facility])    Mild  . AR (aortic regurgitation)    Mild  . Atrial fibrillation and flutter ([place])   . Bladder cancer ([place])   . Chronic pain    Followed at [facility]  . DDD (degenerative disc disease), lumbosacral   . DDD (degenerative disc disease), thoracolumbar   . Degenerative cervical disc   . Depression   . Diabetes mellitus type II   . Diverticulitis   . Essential hypertension, benign   . GERD (gastroesophageal reflux disease)   . Hyperlipidemia   . Kidney stones   . Migraine   . Nonischemic cardiomyopathy ([place])    Cardiolite 12/09 LVEF 55%, minor luminal irregularities at cardiac catheterization  . OSA on CPAP   . Rheumatoid arthritis ([place])   . Symptomatic bradycardia    Medtronic Advisa L dual-chamber pacemaker  05/2015 - Dr. Curt Bears   Past Surgical History:  Procedure Laterality Date  . CARDIAC CATHETERIZATION  08/13/11  . CARDIOVERSION N/A 07/02/2015   Procedure: CARDIOVERSION;  Surgeon: Sanda Klein, MD;  Location: [facility];  Service: Cardiovascular;  Laterality: N/A;  . CYSTOSCOPY WITH HOLMIUM LASER LITHOTRIPSY  1990's  . EP IMPLANTABLE DEVICE N/A 05/21/2015   MDT Advisa DR MRI pacemaker implanted by Dr Curt Bears  . KNEE ARTHROSCOPY Right ~ 1991  . LAPAROSCOPIC CHOLECYSTECTOMY  ~ 2008  . TRANSURETHRAL RESECTION OF BLADDER TUMOR WITH GYRUS (TURBT-GYRUS)  1990's?   Social History   Socioeconomic History  . Marital status: Divorced    Spouse name: None  . Number of children: None  . Years of education: None  . Highest education level: None  Social Needs  . Financial resource strain: None  . Food insecurity - worry: None  . Food insecurity - inability:  None  . Transportation needs - medical: None  . Transportation needs - non-medical: None  Occupational History  . Occupation: Disabled    Employer: DISABLED  Tobacco Use  . Smoking status: Current Every Day Smoker    Packs/day: 0.30    Years: 50.00    Pack years: 15.00    Types: E-cigarettes    Start date: 02/26/1959    Last attempt to quit: 12/06/2013    Years since quitting: 3.7  . Smokeless tobacco: Former Systems developer    Types: Chew  . Tobacco comment: 05/21/2015 "quit chewing in 1992"  Substance and Sexual Activity  . Alcohol use: Yes    Alcohol/week: 1.2 oz    Types: 2 Cans of beer per week  . Drug use: Yes    Types: Cocaine    Comment: 05/21/2015 "quit in the early 2000"  . Sexual activity: None  Other Topics Concern  . None  Social History Narrative   Divorced   No regular exercise   Outpatient Encounter Medications as of 08/19/2017  Medication Sig  . albuterol (PROVENTIL) (2.5 MG/3ML) 0.083% nebulizer solution Take 2.5 mg by nebulization every 6 (six) hours as needed for wheezing.  Marland Kitchen apixaban (ELIQUIS) 5 MG TABS tablet Take 1 tablet (5 mg total) by mouth 2 (two) times daily.  . carvedilol (COREG) 6.25 MG tablet Take 1 tablet (6.25 mg total) by mouth 2 (  two) times daily with a meal.  . Cholecalciferol (VITAMIN D-3) 1000 UNITS CAPS Take 1,000 Units by mouth 2 (two) times daily.   . cholestyramine (QUESTRAN) 4 g packet Take 4 g by mouth 3 (three) times daily with meals.  . citalopram (CELEXA) 40 MG tablet Take 20-40 mg by mouth daily.   Marland Kitchen gabapentin (NEURONTIN) 400 MG capsule Take 400 mg by mouth 3 (three) times daily.   Marland Kitchen ibuprofen (ADVIL,MOTRIN) 200 MG tablet Take 400 mg by mouth every 6 (six) hours as needed for headache or moderate pain.  Marland Kitchen insulin aspart (NOVOLOG FLEXPEN) 100 UNIT/ML FlexPen Inject 18-24 Units into the skin 3 (three) times daily with meals.  . insulin degludec (TRESIBA) 100 UNIT/ML SOPN FlexTouch Pen Inject 80 Units into the skin daily at 10 pm.  . Insulin  Pen Needle (B-D ULTRAFINE III SHORT PEN) 31G X 8 MM MISC Use to inject insulin 4 x daily  . Insulin Pen Needle (PEN NEEDLES) 32G X 6 MM MISC 1 each by Does not apply route 4 (four) times daily.  Marland Kitchen ipratropium-albuterol (DUONEB) 0.5-2.5 (3) MG/3ML SOLN Take 3 mLs by nebulization every 4 (four) hours as needed (for shortness of breath).  . methylPREDNISolone (MEDROL DOSEPAK) 4 MG TBPK tablet Take by mouth.  . metoCLOPramide (REGLAN) 5 MG tablet Take 5 mg by mouth every 6 (six) hours as needed for nausea or vomiting.   . nitroGLYCERIN (NITROSTAT) 0.4 MG SL tablet Place 1 tablet (0.4 mg total) under the tongue every 5 (five) minutes as needed for chest pain (up to 3 doses. If no relief after the 3 rd dose, proceed to the ED for an evaluation). May repeat for up to 3 doses.  . NON FORMULARY 1 each by Other route See admin instructions. CPAP - Uses nightly  . simvastatin (ZOCOR) 40 MG tablet Take 1 tablet (40 mg total) by mouth at bedtime.  . vitamin B-12 (CYANOCOBALAMIN) 1000 MCG tablet Take 1,000 mcg by mouth 2 (two) times daily.    . VOLTAREN 1 % GEL Apply 2 g topically 4 (four) times daily as needed (for pain).   . [DISCONTINUED] insulin aspart (NOVOLOG) 100 UNIT/ML injection Inject 18-24 Units into the skin 3 (three) times daily before meals.   No facility-administered encounter medications on file as of 08/19/2017.     ALLERGIES: No Known Allergies  VACCINATION STATUS:  There is no immunization history on file for this patient.  Diabetes  He presents for his follow-up diabetic visit. He has type 2 diabetes mellitus. Onset time: He was diagnosed at approximate age of 59 years. His disease course has been worsening (His most recent A1c was 9.8% on 01/12/2017.). There are no hypoglycemic associated symptoms. Pertinent negatives for hypoglycemia include no confusion, headaches, pallor or seizures. Associated symptoms include foot paresthesias, polydipsia and polyuria. Pertinent negatives for  diabetes include no chest pain, no fatigue, no polyphagia and no weakness. There are no hypoglycemic complications. Symptoms are worsening. Diabetic complications include peripheral neuropathy. Risk factors for coronary artery disease include dyslipidemia, diabetes mellitus, hypertension, male sex, sedentary lifestyle, tobacco exposure and family history. Current diabetic treatment includes oral agent (dual therapy). His weight is increasing steadily. He is following a generally unhealthy diet. When asked about meal planning, he reported none. He has not had a previous visit with a dietitian. He never (He is wheelchair-bound due to deconditioning, and due todegenerative joint disease.) participates in exercise. His breakfast blood glucose range is generally >200 mg/dl. His lunch blood glucose  range is generally >200 mg/dl. His dinner blood glucose range is generally >200 mg/dl. His bedtime blood glucose range is generally >200 mg/dl. His overall blood glucose range is >200 mg/dl. (He came with significantly above target blood glucose profile.  Did not do his previsit labs.) An ACE inhibitor/angiotensin II receptor blocker is being taken. He sees a podiatrist. Hyperlipidemia  This is a chronic problem. The current episode started more than 1 year ago. Exacerbating diseases include diabetes and obesity. Factors aggravating his hyperlipidemia include smoking. Pertinent negatives include no chest pain, myalgias or shortness of breath. Current antihyperlipidemic treatment includes statins. Risk factors for coronary artery disease include dyslipidemia, diabetes mellitus, hypertension, a sedentary lifestyle, male sex and obesity.  Hypertension  This is a chronic problem. The current episode started more than 1 month ago. Pertinent negatives include no chest pain, headaches, neck pain, palpitations or shortness of breath. Risk factors for coronary artery disease include diabetes mellitus, dyslipidemia, male gender,  obesity, sedentary lifestyle and smoking/tobacco exposure. Past treatments include ACE inhibitors.    Review of Systems  Constitutional: Negative for chills, fatigue, fever and unexpected weight change.  HENT: Negative for dental problem, mouth sores and trouble swallowing.   Eyes: Negative for visual disturbance.  Respiratory: Positive for cough. Negative for choking, chest tightness, shortness of breath and wheezing.        Wears nasal mask due to coughing.  Cardiovascular: Negative for chest pain, palpitations and leg swelling.  Gastrointestinal: Negative for abdominal distention, abdominal pain, constipation, diarrhea, nausea and vomiting.  Endocrine: Positive for polydipsia and polyuria. Negative for polyphagia.  Genitourinary: Negative for dysuria, flank pain, hematuria and urgency.  Musculoskeletal: Positive for gait problem. Negative for back pain, myalgias and neck pain.       Walks with a walker.  Skin: Negative for pallor, rash and wound.  Neurological: Negative for seizures, syncope, weakness, numbness and headaches.  Psychiatric/Behavioral: Negative for confusion and dysphoric mood.    Objective:    BP (!) 145/74   Pulse 84   Ht 5\' 9"  (1.753 m)   Wt 238 lb (108 kg)   BMI 35.15 kg/m   Wt Readings from Last 3 Encounters:  08/19/17 238 lb (108 kg)  06/24/17 231 lb (104.8 kg)  05/24/17 217 lb (98.4 kg)     Physical Exam  Constitutional: He is oriented to person, place, and time. He appears well-developed and well-nourished. He is cooperative. No distress.  HENT:  Head: Normocephalic and atraumatic.  Eyes: EOM are normal.  Neck: Normal range of motion. Neck supple. No tracheal deviation present. No thyromegaly present.  Cardiovascular: Normal rate, S1 normal, S2 normal and normal heart sounds. Exam reveals no gallop.  No murmur heard. Pulses:      Dorsalis pedis pulses are 1+ on the right side, and 1+ on the left side.       Posterior tibial pulses are 1+ on the  right side, and 1+ on the left side.  Pulmonary/Chest: Breath sounds normal. No respiratory distress. He has no wheezes.  Abdominal: Soft. Bowel sounds are normal. He exhibits no distension. There is no tenderness. There is no guarding and no CVA tenderness.  Musculoskeletal: He exhibits no edema.       Right shoulder: He exhibits no swelling and no deformity.  Uses walker to get around.  Neurological: He is alert and oriented to person, place, and time. He has normal strength and normal reflexes. No cranial nerve deficit or sensory deficit. Gait normal.  Skin:  Skin is warm and dry. No rash noted. No cyanosis. Nails show no clubbing.  Psychiatric: He has a normal mood and affect. His speech is normal. Cognition and memory are normal.    CMP     Component Value Date/Time   NA 137 05/21/2015 0848   K 4.2 05/21/2015 0848   CL 103 05/21/2015 0848   CO2 22 05/21/2015 0848   GLUCOSE 162 (H) 05/21/2015 0848   BUN 24 (A) 04/16/2017   CREATININE 1.7 (A) 04/16/2017   CREATININE 1.13 05/21/2015 0848   CREATININE 1.29 (H) 04/12/2015 1609   CALCIUM 9.6 05/21/2015 0848   GFRNONAA >60 05/21/2015 0848   GFRAA >60 05/21/2015 0848     No results found for this or any previous visit (from the past 2160 hour(s)).    Assessment & Plan:   1. Type 2 diabetes mellitus with stage 3 chronic kidney disease, without long-term current use of insulin ([place])  - Patient has currently uncontrolled symptomatic type 2 DM since  72 years of age. -Patient came with still significantly above target blood glucose profile despite large dose of insulin.  -He forgot to do his previsit labs.  His A1c during his last visit was 10.1%.   -his diabetes is complicated by stage 3 renal insufficiency, obesity/sedentary life, neuropathy, cardiomyopathy, chronic heavy smoking, inadequate insurance and TEREZ FREIMARK remains at a high risk for more acute and chronic complications which include CAD, CVA, CKD, retinopathy, and  neuropathy. These are all discussed in detail with the patient.  - I have counseled him on diet management and weight loss, by adopting a carbohydrate restricted/protein rich diet.  -  Suggestion is made for him to avoid simple carbohydrates  from his diet including Cakes, Sweet Desserts / Pastries, Ice Cream, Soda (diet and regular), Sweet Tea, Candies, Chips, Cookies, Store Bought Juices, Alcohol in Excess of  1-2 drinks a day, Artificial Sweeteners, and "Sugar-free" Products. This will help patient to have stable blood glucose profile and potentially avoid unintended weight gain.  - I encouraged him to switch to  unprocessed or minimally processed complex starch and increased protein intake (animal or plant source), fruits, and vegetables.  - he is advised to stick to a routine mealtimes to eat 3 meals  a day and avoid unnecessary snacks ( to snack only to correct hypoglycemia).   - I have approached him with the following individualized plan to manage diabetes and patient agrees:   - Based on his glucose profile, he will  need even higher dose of insulin in the basal /bolus  fashion for him to achieve control of diabetes to target.  -Will be sent for new set of labs today. -   I will increase to 80 units nightly, NovoLog to 18 units 3 times a day before meals for pre-meal blood glucose readings above 90 mg/dL (with additional correction for readings above 150 mg/dL)  associated with strict monitoring of blood glucose 4 times a day- before meals and at bedtime. - He is advised to stay committed for proper monitoring for safe use of insulin.  -Patient is encouraged to call clinic for blood glucose levels less than 70 or above 300 mg /dl. - He is not a candidate for metformin therapy due to CKD.  - He  will be considered for incretin therapy when he gets appropriate insurance coverage.  - Patient specific target  A1c;  LDL, HDL, Triglycerides, and  Waist Circumference were discussed in  detail.  2) BP/HTN:  His blood pressure is not controlled to target.  -He admits to salt indiscretion.  I advised him to continue current blood pressure medications including  ACEI/ARB, and avoid adding salt to his food. 3) Lipids/HPL:   Lipid panel unknown.   Patient is advised to continue statins. 4)  Weight/Diet: CDE Consult has been initiated , exercise, and detailed carbohydrates information provided.  5) Chronic Care/Health Maintenance:  -he  is on ACEI/ARB and Statin medications and  is encouraged to continue to follow up with Ophthalmology, Dentist,  Podiatrist at least yearly or according to recommendations, and advised to  stay away from smoking. I have recommended yearly flu vaccine and pneumonia vaccination at least every 5 years; and  sleep for at least 7 hours a day.  - Time spent with the patient: 25 min, of which >50% was spent in reviewing his blood glucose logs , discussing his hypo- and hyper-glycemic episodes, reviewing his current and  previous labs and insulin doses and developing a plan to avoid hypo- and hyper-glycemia. Please refer to Patient Instructions for Blood Glucose Monitoring and Insulin/Medications Dosing Guide"  in media tab for additional information.  - I advised patient to maintain close follow up with Jerene Bears B, MD for primary care needs.  Follow up plan: - Return in about 2 weeks (around 09/02/2017) for meter, and logs, labs today, follow up with pre-visit labs, meter, and logs.  Glade Lloyd, MD Phone: [phone]  Fax: [phone]   08/19/2017, 4:03 PM This note was partially dictated with voice recognition software. Similar sounding words can be transcribed inadequately or may not  be corrected upon review.

## 2017-08-19 NOTE — Patient Instructions (Signed)

## 2017-08-20 DIAGNOSIS — E1122 Type 2 diabetes mellitus with diabetic chronic kidney disease: Secondary | ICD-10-CM | POA: Diagnosis not present

## 2017-08-20 DIAGNOSIS — N183 Chronic kidney disease, stage 3 (moderate): Secondary | ICD-10-CM | POA: Diagnosis not present

## 2017-08-20 DIAGNOSIS — E1165 Type 2 diabetes mellitus with hyperglycemia: Secondary | ICD-10-CM | POA: Diagnosis not present

## 2017-08-20 LAB — CUP PACEART REMOTE DEVICE CHECK
Brady Statistic AP VS Percent: 0 %
Brady Statistic AS VP Percent: 95.91 %
Brady Statistic AS VS Percent: 4.09 %
Brady Statistic RA Percent Paced: 0 %
Brady Statistic RV Percent Paced: 96.97 %
Date Time Interrogation Session: 20190131144516
Implantable Pulse Generator Implant Date: 20161115
Lead Channel Impedance Value: 361 Ohm
Lead Channel Impedance Value: 437 Ohm
Lead Channel Pacing Threshold Amplitude: 0.625 V
Lead Channel Pacing Threshold Pulse Width: 0.4 ms
Lead Channel Pacing Threshold Pulse Width: 0.4 ms
Lead Channel Sensing Intrinsic Amplitude: 3 mV
Lead Channel Sensing Intrinsic Amplitude: 6.125 mV
Lead Channel Sensing Intrinsic Amplitude: 6.125 mV
Lead Channel Setting Pacing Amplitude: 2.5 V
Lead Channel Setting Sensing Sensitivity: 2.8 mV
MDC IDC MSMT BATTERY REMAINING LONGEVITY: 65 mo
MDC IDC MSMT BATTERY VOLTAGE: 3 V
MDC IDC MSMT LEADCHNL RA IMPEDANCE VALUE: 475 Ohm
MDC IDC MSMT LEADCHNL RA PACING THRESHOLD AMPLITUDE: 0.5 V
MDC IDC MSMT LEADCHNL RA SENSING INTR AMPL: 3 mV
MDC IDC MSMT LEADCHNL RV IMPEDANCE VALUE: 342 Ohm
MDC IDC SET LEADCHNL RV PACING PULSEWIDTH: 0.4 ms
MDC IDC STAT BRADY AP VP PERCENT: 0 %

## 2017-08-20 LAB — HEMOGLOBIN A1C: Hemoglobin A1C: 9.7

## 2017-08-20 LAB — BASIC METABOLIC PANEL
BUN: 15 (ref 4–21)
CREATININE: 1.4 — AB (ref 0.6–1.3)

## 2017-09-02 ENCOUNTER — Encounter: Payer: Self-pay | Admitting: "Endocrinology

## 2017-09-02 ENCOUNTER — Ambulatory Visit (INDEPENDENT_AMBULATORY_CARE_PROVIDER_SITE_OTHER): Payer: PPO | Admitting: "Endocrinology

## 2017-09-02 VITALS — BP 136/77 | HR 87 | Ht 69.0 in | Wt 240.0 lb

## 2017-09-02 DIAGNOSIS — E1122 Type 2 diabetes mellitus with diabetic chronic kidney disease: Secondary | ICD-10-CM

## 2017-09-02 DIAGNOSIS — I10 Essential (primary) hypertension: Secondary | ICD-10-CM | POA: Diagnosis not present

## 2017-09-02 DIAGNOSIS — E1165 Type 2 diabetes mellitus with hyperglycemia: Secondary | ICD-10-CM | POA: Diagnosis not present

## 2017-09-02 DIAGNOSIS — E6609 Other obesity due to excess calories: Secondary | ICD-10-CM

## 2017-09-02 DIAGNOSIS — E782 Mixed hyperlipidemia: Secondary | ICD-10-CM | POA: Diagnosis not present

## 2017-09-02 DIAGNOSIS — IMO0002 Reserved for concepts with insufficient information to code with codable children: Secondary | ICD-10-CM

## 2017-09-02 DIAGNOSIS — Z6834 Body mass index (BMI) 34.0-34.9, adult: Secondary | ICD-10-CM | POA: Diagnosis not present

## 2017-09-02 DIAGNOSIS — N183 Chronic kidney disease, stage 3 (moderate): Secondary | ICD-10-CM | POA: Diagnosis not present

## 2017-09-02 MED ORDER — INSULIN PEN NEEDLE 31G X 8 MM MISC: 1.0000 | 3 refills | Status: DC

## 2017-09-02 MED ORDER — INSULIN GLARGINE 100 UNIT/ML SOLOSTAR PEN: 80.0000 [IU] | PEN_INJECTOR | Freq: Every day | SUBCUTANEOUS | 2 refills | Status: DC

## 2017-09-02 MED ORDER — INSULIN ASPART 100 UNIT/ML FLEXPEN: 22.0000 [IU] | PEN_INJECTOR | Freq: Three times a day (TID) | SUBCUTANEOUS | 2 refills | Status: DC

## 2017-09-02 NOTE — Progress Notes (Signed)
Subjective:    Patient ID: Dylan Tyler, male    DOB: Jan 29, 1946.  he is being seen in consultation for management of currently uncontrolled symptomatic diabetes requested by  Glenda Chroman, MD.   Past Medical History:  Diagnosis Date  . Ankylosing spondylitis ([place])   . Anxiety   . Aortic root dilatation (HCC)    Mild  . AR (aortic regurgitation)    Mild  . Atrial fibrillation and flutter ([place])   . Bladder cancer ([place])   . Chronic pain    Followed at [facility]  . DDD (degenerative disc disease), lumbosacral   . DDD (degenerative disc disease), thoracolumbar   . Degenerative cervical disc   . Depression   . Diabetes mellitus type II   . Diverticulitis   . Essential hypertension, benign   . GERD (gastroesophageal reflux disease)   . Hyperlipidemia   . Kidney stones   . Migraine   . Nonischemic cardiomyopathy ([place])    Cardiolite 12/09 LVEF 55%, minor luminal irregularities at cardiac catheterization  . OSA on CPAP   . Rheumatoid arthritis ([place])   . Symptomatic bradycardia    Medtronic Advisa L dual-chamber pacemaker  05/2015 - Dr. Curt Bears   Past Surgical History:  Procedure Laterality Date  . CARDIAC CATHETERIZATION  08/13/11  . CARDIOVERSION N/A 07/02/2015   Procedure: CARDIOVERSION;  Surgeon: Sanda Klein, MD;  Location: [facility] END[facility];  Service: Cardiovascular;  Laterality: N/A;  . CYSTOSCOPY WITH HOLMIUM LASER LITHOTRIPSY  1990's  . EP IMPLANTABLE DEVICE N/A 05/21/2015   MDT Advisa DR MRI pacemaker implanted by Dr Curt Bears  . KNEE ARTHROSCOPY Right ~ 1991  . LAPAROSCOPIC CHOLECYSTECTOMY  ~ 2008  . TRANSURETHRAL RESECTION OF BLADDER TUMOR WITH GYRUS (TURBT-GYRUS)  1990's?   Social History   Socioeconomic History  . Marital status: Divorced    Spouse name: None  . Number of children: None  . Years of education: None  . Highest education level: None  Social Needs  . Financial resource strain: None  . Food insecurity - worry: None  . Food insecurity - inability:  None  . Transportation needs - medical: None  . Transportation needs - non-medical: None  Occupational History  . Occupation: Disabled    Employer: DISABLED  Tobacco Use  . Smoking status: Current Every Day Smoker    Packs/day: 0.30    Years: 50.00    Pack years: 15.00    Types: E-cigarettes    Start date: 02/26/1959    Last attempt to quit: 12/06/2013    Years since quitting: 3.7  . Smokeless tobacco: Former Systems developer    Types: Chew  . Tobacco comment: 05/21/2015 "quit chewing in 1992"  Substance and Sexual Activity  . Alcohol use: Yes    Alcohol/week: 1.2 oz    Types: 2 Cans of beer per week  . Drug use: Yes    Types: Cocaine    Comment: 05/21/2015 "quit in the early 2000"  . Sexual activity: None  Other Topics Concern  . None  Social History Narrative   Divorced   No regular exercise   Outpatient Encounter Medications as of 09/02/2017  Medication Sig  . albuterol (PROVENTIL) (2.5 MG/3ML) 0.083% nebulizer solution Take 2.5 mg by nebulization every 6 (six) hours as needed for wheezing.  Marland Kitchen apixaban (ELIQUIS) 5 MG TABS tablet Take 1 tablet (5 mg total) by mouth 2 (two) times daily.  . carvedilol (COREG) 6.25 MG tablet Take 1 tablet (6.25 mg total) by mouth 2 (  two) times daily with a meal.  . Cholecalciferol (VITAMIN D-3) 1000 UNITS CAPS Take 1,000 Units by mouth 2 (two) times daily.   . cholestyramine (QUESTRAN) 4 g packet Take 4 g by mouth 3 (three) times daily with meals.  . citalopram (CELEXA) 40 MG tablet Take 20-40 mg by mouth daily.   Marland Kitchen gabapentin (NEURONTIN) 400 MG capsule Take 400 mg by mouth 3 (three) times daily.   Marland Kitchen ibuprofen (ADVIL,MOTRIN) 200 MG tablet Take 400 mg by mouth every 6 (six) hours as needed for headache or moderate pain.  Marland Kitchen insulin aspart (NOVOLOG FLEXPEN) 100 UNIT/ML FlexPen Inject 22-28 Units into the skin 3 (three) times daily with meals.  . Insulin Glargine (LANTUS SOLOSTAR) 100 UNIT/ML Solostar Pen Inject 80 Units into the skin daily at 10 pm.  .  Insulin Pen Needle (B-D ULTRAFINE III SHORT PEN) 31G X 8 MM MISC Use to inject insulin 4 x daily  . Insulin Pen Needle (B-D ULTRAFINE III SHORT PEN) 31G X 8 MM MISC 1 each by Does not apply route as directed.  . Insulin Pen Needle (PEN NEEDLES) 32G X 6 MM MISC 1 each by Does not apply route 4 (four) times daily.  Marland Kitchen ipratropium-albuterol (DUONEB) 0.5-2.5 (3) MG/3ML SOLN Take 3 mLs by nebulization every 4 (four) hours as needed (for shortness of breath).  . methylPREDNISolone (MEDROL DOSEPAK) 4 MG TBPK tablet Take by mouth.  . metoCLOPramide (REGLAN) 5 MG tablet Take 5 mg by mouth every 6 (six) hours as needed for nausea or vomiting.   . nitroGLYCERIN (NITROSTAT) 0.4 MG SL tablet Place 1 tablet (0.4 mg total) under the tongue every 5 (five) minutes as needed for chest pain (up to 3 doses. If no relief after the 3 rd dose, proceed to the ED for an evaluation). May repeat for up to 3 doses.  . NON FORMULARY 1 each by Other route See admin instructions. CPAP - Uses nightly  . simvastatin (ZOCOR) 40 MG tablet Take 1 tablet (40 mg total) by mouth at bedtime.  . vitamin B-12 (CYANOCOBALAMIN) 1000 MCG tablet Take 1,000 mcg by mouth 2 (two) times daily.    . VOLTAREN 1 % GEL Apply 2 g topically 4 (four) times daily as needed (for pain).   . [DISCONTINUED] insulin aspart (NOVOLOG FLEXPEN) 100 UNIT/ML FlexPen Inject 18-24 Units into the skin 3 (three) times daily with meals.  . [DISCONTINUED] insulin degludec (TRESIBA) 100 UNIT/ML SOPN FlexTouch Pen Inject 80 Units into the skin daily at 10 pm.   No facility-administered encounter medications on file as of 09/02/2017.     ALLERGIES: No Known Allergies  VACCINATION STATUS:  There is no immunization history on file for this patient.  Diabetes  He presents for his follow-up diabetic visit. He has type 2 diabetes mellitus. Onset time: He was diagnosed at approximate age of 16 years. His disease course has been improving (His most recent A1c was 9.8% on  01/12/2017.). There are no hypoglycemic associated symptoms. Pertinent negatives for hypoglycemia include no confusion, headaches, pallor or seizures. Associated symptoms include foot paresthesias, polydipsia and polyuria. Pertinent negatives for diabetes include no chest pain, no fatigue, no polyphagia and no weakness. There are no hypoglycemic complications. Symptoms are improving. Diabetic complications include peripheral neuropathy. Risk factors for coronary artery disease include dyslipidemia, diabetes mellitus, hypertension, male sex, sedentary lifestyle, tobacco exposure and family history. Current diabetic treatment includes oral agent (dual therapy). His weight is increasing steadily. He is following a generally unhealthy diet. When  asked about meal planning, he reported none. He has not had a previous visit with a dietitian. He never (He is wheelchair-bound due to deconditioning, and due todegenerative joint disease.) participates in exercise. His breakfast blood glucose range is generally 180-200 mg/dl. His lunch blood glucose range is generally 180-200 mg/dl. His dinner blood glucose range is generally >200 mg/dl. His bedtime blood glucose range is generally 180-200 mg/dl. His overall blood glucose range is >200 mg/dl. (He came with improving however still significantly above target blood glucose profile.  His recent labs show A1c of 9.7%. ) An ACE inhibitor/angiotensin II receptor blocker is being taken. He sees a podiatrist. Hyperlipidemia  This is a chronic problem. The current episode started more than 1 year ago. Exacerbating diseases include diabetes and obesity. Factors aggravating his hyperlipidemia include smoking. Pertinent negatives include no chest pain, myalgias or shortness of breath. Current antihyperlipidemic treatment includes statins. Risk factors for coronary artery disease include dyslipidemia, diabetes mellitus, hypertension, a sedentary lifestyle, male sex and obesity.   Hypertension  This is a chronic problem. The current episode started more than 1 year ago. The problem is controlled. Pertinent negatives include no chest pain, headaches, neck pain, palpitations or shortness of breath. Risk factors for coronary artery disease include diabetes mellitus, dyslipidemia, male gender, obesity, sedentary lifestyle and smoking/tobacco exposure. Past treatments include ACE inhibitors.    Review of Systems  Constitutional: Negative for chills, fatigue, fever and unexpected weight change.  HENT: Negative for dental problem, mouth sores and trouble swallowing.   Eyes: Negative for visual disturbance.  Respiratory: Positive for cough. Negative for choking, chest tightness, shortness of breath and wheezing.   Cardiovascular: Negative for chest pain, palpitations and leg swelling.  Gastrointestinal: Negative for abdominal distention, abdominal pain, constipation, diarrhea, nausea and vomiting.  Endocrine: Positive for polydipsia and polyuria. Negative for polyphagia.  Genitourinary: Negative for dysuria, flank pain, hematuria and urgency.  Musculoskeletal: Positive for gait problem. Negative for back pain, myalgias and neck pain.       Walks with a cane.  Skin: Negative for pallor, rash and wound.  Neurological: Negative for seizures, syncope, weakness, numbness and headaches.  Psychiatric/Behavioral: Negative for confusion and dysphoric mood.    Objective:    BP 136/77   Pulse 87   Ht 5\' 9"  (1.753 m)   Wt 240 lb (108.9 kg)   BMI 35.44 kg/m   Wt Readings from Last 3 Encounters:  09/02/17 240 lb (108.9 kg)  08/19/17 238 lb (108 kg)  06/24/17 231 lb (104.8 kg)     Physical Exam  Constitutional: He is oriented to person, place, and time. He appears well-developed and well-nourished. He is cooperative. No distress.  HENT:  Head: Normocephalic and atraumatic.  Eyes: EOM are normal.  Neck: Normal range of motion. Neck supple. No tracheal deviation present. No  thyromegaly present.  Cardiovascular: Normal rate, S1 normal, S2 normal and normal heart sounds. Exam reveals no gallop.  No murmur heard. Pulses:      Dorsalis pedis pulses are 1+ on the right side, and 1+ on the left side.       Posterior tibial pulses are 1+ on the right side, and 1+ on the left side.  Pulmonary/Chest: Breath sounds normal. No respiratory distress. He has no wheezes.  Abdominal: Soft. Bowel sounds are normal. He exhibits no distension. There is no tenderness. There is no guarding and no CVA tenderness.  Musculoskeletal: He exhibits no edema.       Right shoulder: He  exhibits no swelling and no deformity.  Uses walker to get around.  Neurological: He is alert and oriented to person, place, and time. He has normal strength and normal reflexes. No cranial nerve deficit or sensory deficit. Gait normal.  Skin: Skin is warm and dry. No rash noted. No cyanosis. Nails show no clubbing.  Psychiatric: He has a normal mood and affect. His speech is normal. Cognition and memory are normal.    CMP     Component Value Date/Time   NA 137 05/21/2015 0848   K 4.2 05/21/2015 0848   CL 103 05/21/2015 0848   CO2 22 05/21/2015 0848   GLUCOSE 162 (H) 05/21/2015 0848   BUN 24 (A) 04/16/2017   CREATININE 1.7 (A) 04/16/2017   CREATININE 1.13 05/21/2015 0848   CREATININE 1.29 (H) 04/12/2015 1609   CALCIUM 9.6 05/21/2015 0848   GFRNONAA >60 05/21/2015 0848   GFRAA >60 05/21/2015 0848   A1c of 9.7% on August 20, 2017.     Assessment & Plan:   1. Type 2 diabetes mellitus with stage 3 chronic kidney disease, without long-term current use of insulin ([place])  - Patient has currently uncontrolled symptomatic type 2 DM since  72 years of age. -Patient came with still significantly above target blood glucose profile despite large dose of insulin.  -We will repeat labs show improving renal function, A1c is still high at 9.7%.   -his diabetes is complicated by stage 3 renal insufficiency,  obesity/sedentary life, neuropathy, cardiomyopathy, chronic heavy smoking, inadequate insurance and DENYM RAHIMI remains at a high risk for more acute and chronic complications which include CAD, CVA, CKD, retinopathy, and neuropathy. These are all discussed in detail with the patient.  - I have counseled him on diet management and weight loss, by adopting a carbohydrate restricted/protein rich diet.  -  Suggestion is made for him to avoid simple carbohydrates  from his diet including Cakes, Sweet Desserts / Pastries, Ice Cream, Soda (diet and regular), Sweet Tea, Candies, Chips, Cookies, Store Bought Juices, Alcohol in Excess of  1-2 drinks a day, Artificial Sweeteners, and "Sugar-free" Products. This will help patient to have stable blood glucose profile and potentially avoid unintended weight gain.  - I encouraged him to switch to  unprocessed or minimally processed complex starch and increased protein intake (animal or plant source), fruits, and vegetables.  - he is advised to stick to a routine mealtimes to eat 3 meals  a day and avoid unnecessary snacks ( to snack only to correct hypoglycemia).   - I have approached him with the following individualized plan to manage diabetes and patient agrees:   - Based on his glucose profile, he will  need even higher dose of insulin in the basal /bolus  fashion for him to achieve control of diabetes to target.  -Will be sent for new set of labs today. -   I will continue Lantus  80 units nightly, increase NovoLog to 22  units 3 times a day before meals for pre-meal blood glucose readings above 90 mg/dL (with additional correction for readings above 150 mg/dL)  associated with strict monitoring of blood glucose 4 times a day- before meals and at bedtime. - He is advised to stay committed for proper monitoring for safe use of insulin.  -Patient is encouraged to call clinic for blood glucose levels less than 70 or above 300 mg /dl. - He is not a candidate  for metformin therapy due to CKD.  - He  will be considered for incretin therapy when he gets appropriate insurance coverage.  - Patient specific target  A1c;  LDL, HDL, Triglycerides, and  Waist Circumference were discussed in detail.  2) BP/HTN: His blood pressure is not controlled to target.  -He admits to salt indiscretion.  I advised him to continue current blood pressure medications including  ACEI/ARB, and avoid adding salt to his food. 3) Lipids/HPL:   Lipid panel unknown.   Patient is advised to continue statins.  He will have fasting lipid panel along with his next blood work. 4)  Weight/Diet: CDE Consult has been initiated , exercise, and detailed carbohydrates information provided.  5) Chronic Care/Health Maintenance:  -he  is on ACEI/ARB and Statin medications and  is encouraged to continue to follow up with Ophthalmology, Dentist,  Podiatrist at least yearly or according to recommendations, and advised to  stay away from smoking. I have recommended yearly flu vaccine and pneumonia vaccination at least every 5 years; and  sleep for at least 7 hours a day.  - Time spent with the patient: 25 min, of which >50% was spent in reviewing his blood glucose logs , discussing his hypo- and hyper-glycemic episodes, reviewing his current and  previous labs and insulin doses and developing a plan to avoid hypo- and hyper-glycemia. Please refer to Patient Instructions for Blood Glucose Monitoring and Insulin/Medications Dosing Guide"  in media tab for additional information.   - I advised patient to maintain close follow up with Jerene Bears B, MD for primary care needs.  Follow up plan: - Return in about 3 months (around 11/30/2017) for follow up with pre-visit labs, meter, and logs.  Glade Lloyd, MD Phone: [phone]  Fax: [phone]   09/02/2017, 3:14 PM This note was partially dictated with voice recognition software. Similar sounding words can be transcribed inadequately or may not  be  corrected upon review.

## 2017-09-02 NOTE — Patient Instructions (Signed)

## 2017-09-15 ENCOUNTER — Other Ambulatory Visit: Payer: Self-pay | Admitting: Cardiology

## 2017-09-18 IMAGING — DX DG CHEST 2V
2 series · 2 of 2 positions shown · non-contrast
Comparison: 02/12/2011

CLINICAL DATA: Cardiac device in situ.

EXAM:
CHEST  2 VIEW

[chest pa]
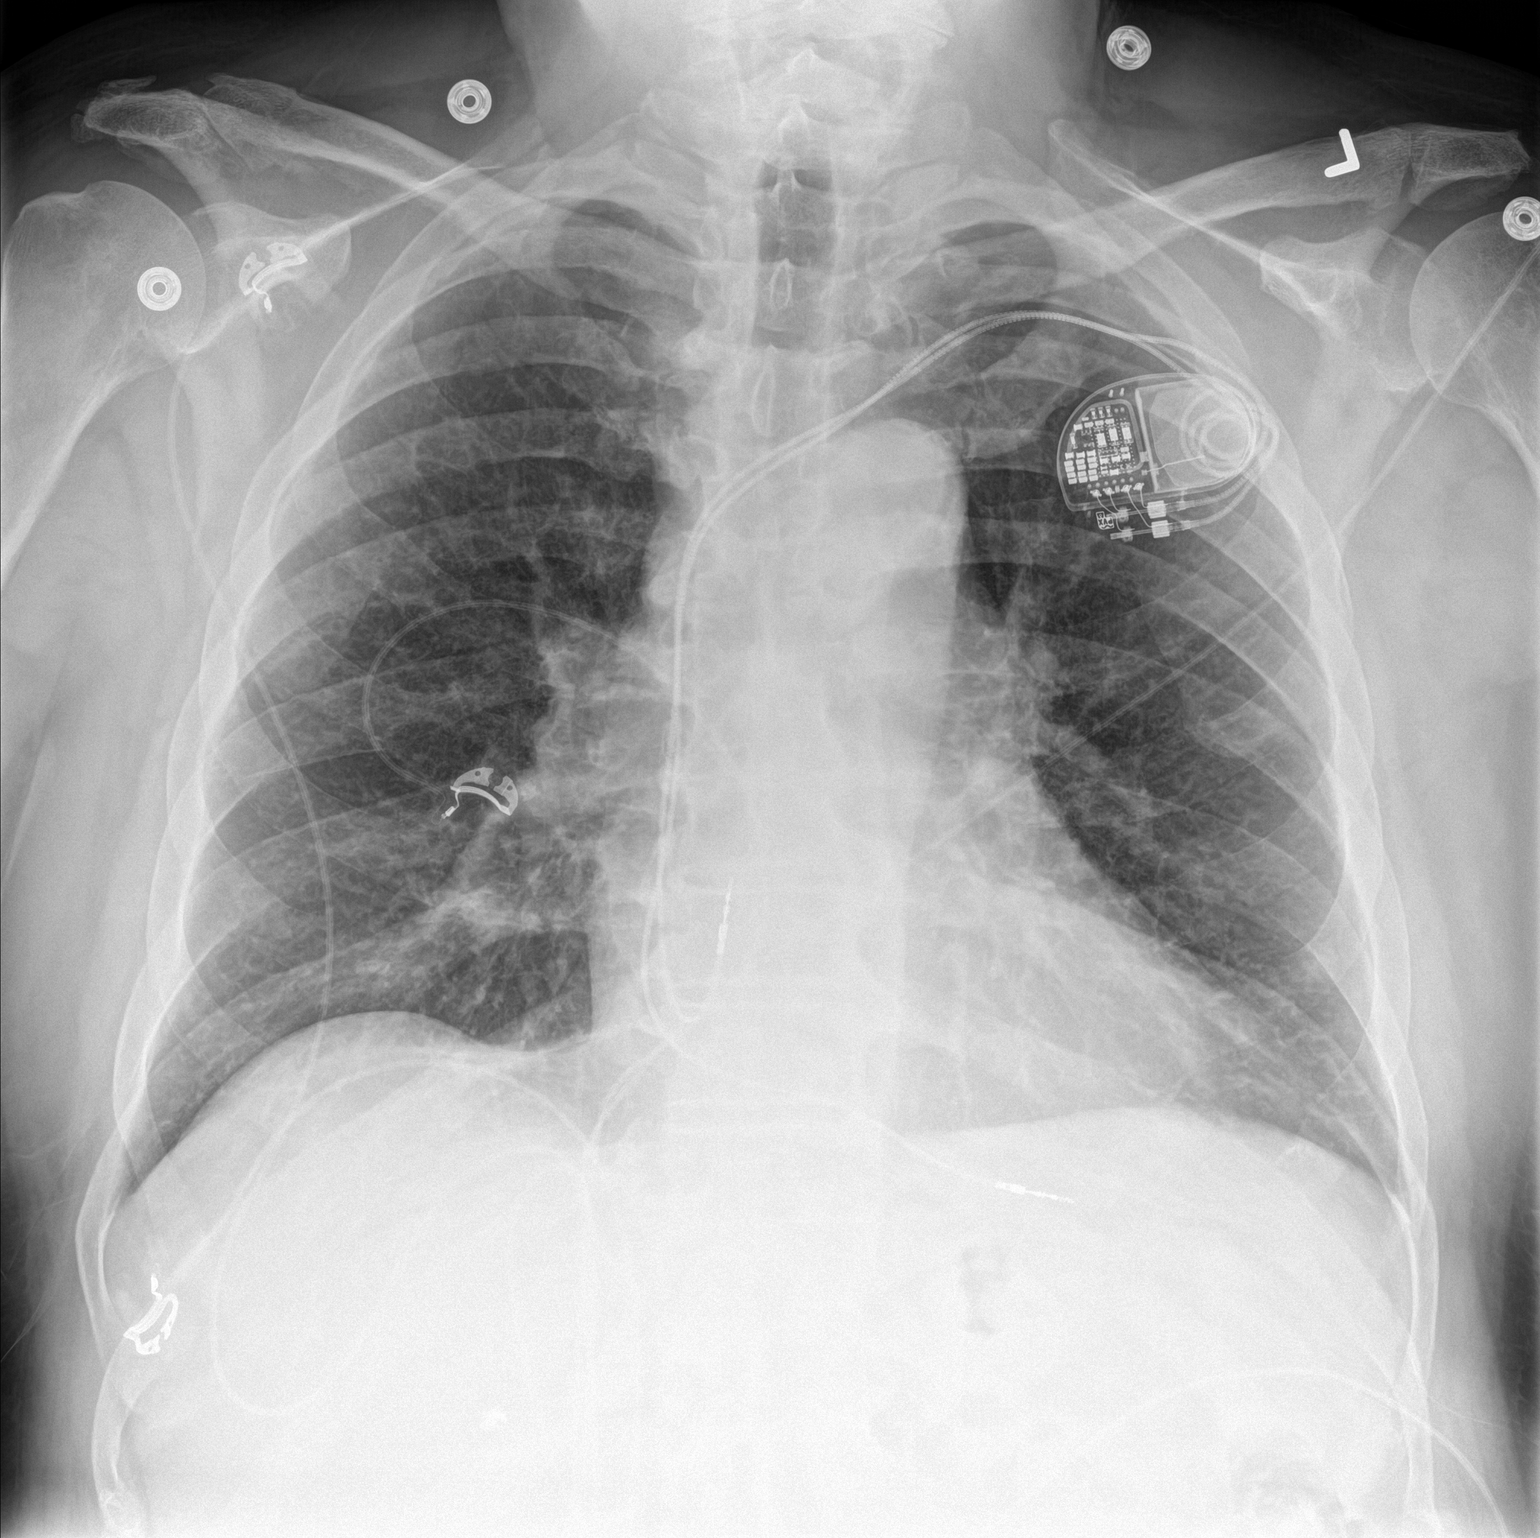

[chest lat]
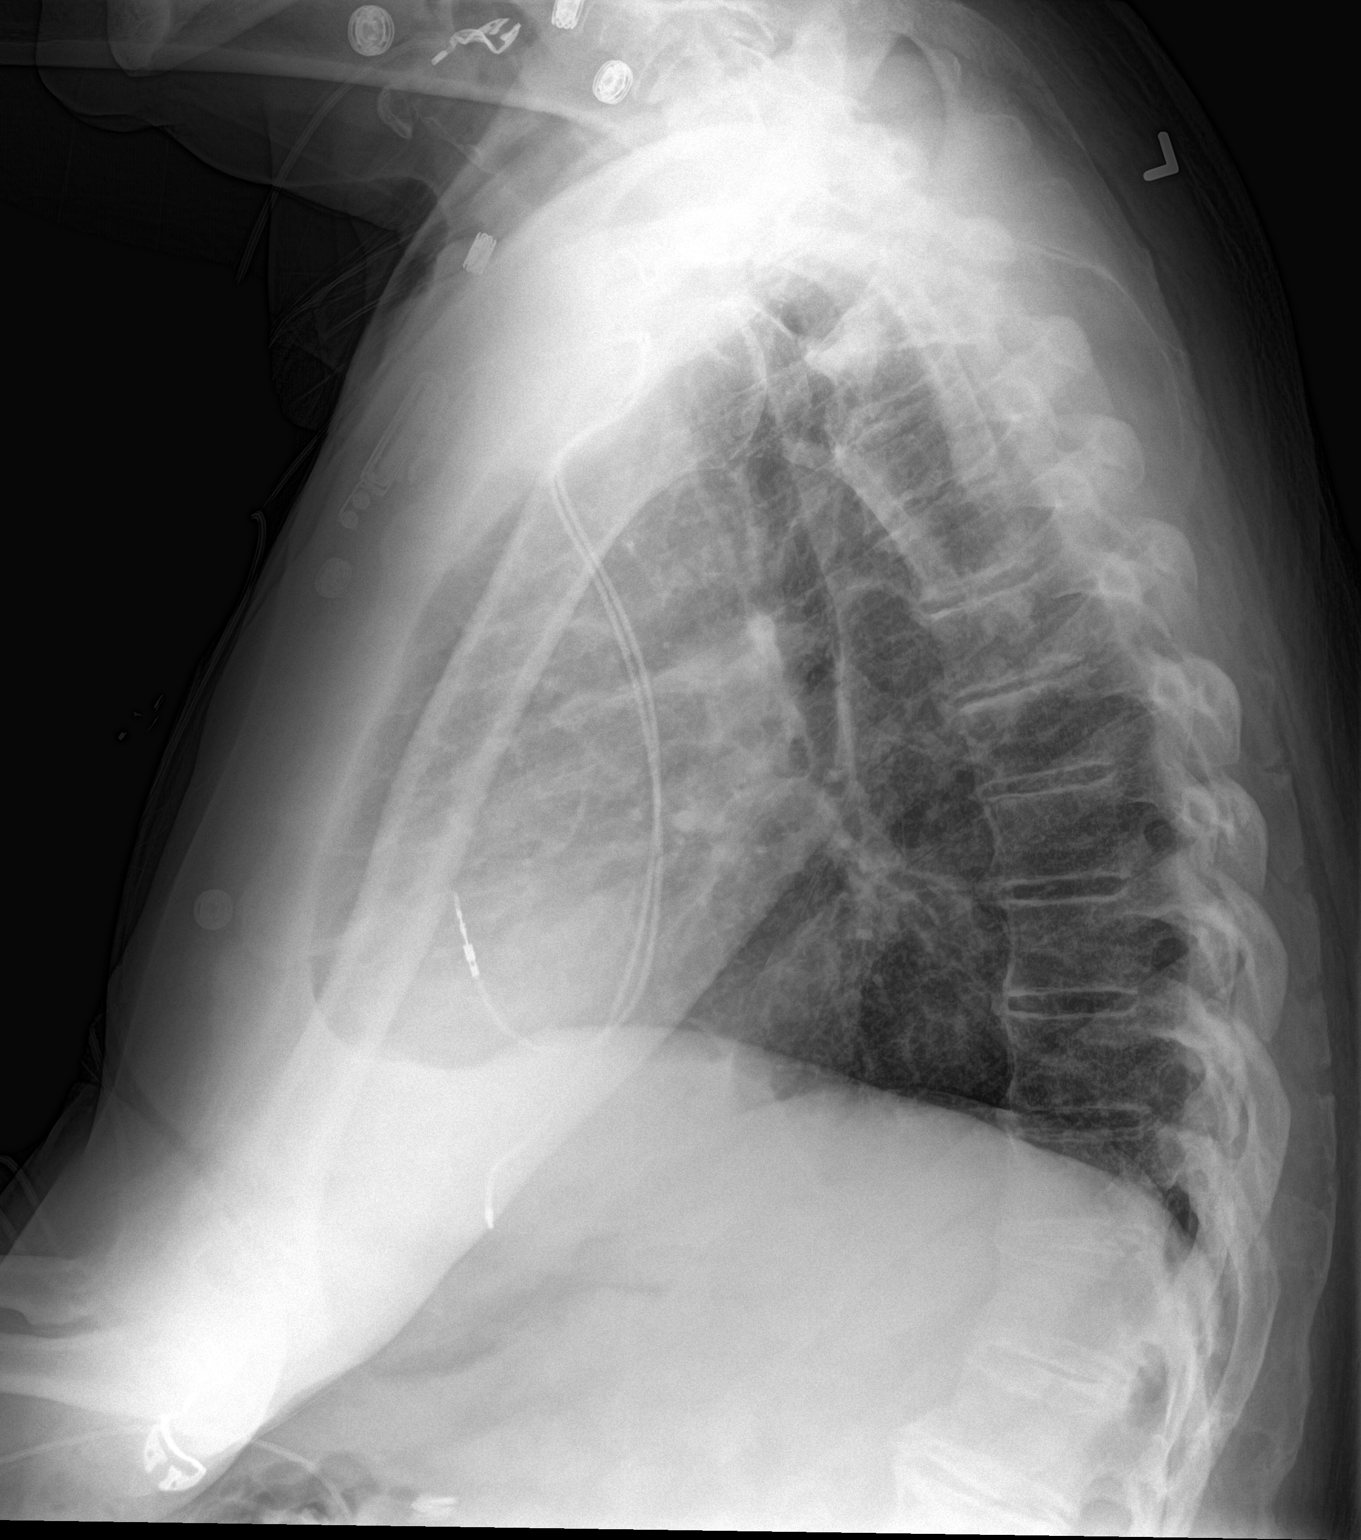

[2 of 2 positions shown; findings below may reference images not displayed]

FINDINGS: There has been interval placement of a left subclavian approach dual
lead pacemaker with leads terminating over the right atrium and
right ventricle. The cardiomediastinal silhouette is unchanged and
within normal limits. The lungs are well inflated and clear. No
pleural effusion or pneumothorax is identified. Thoracic spondylosis
is noted.
IMPRESSION: Interval pacemaker placement. No pneumothorax or evidence of acute
airspace disease.

## 2017-10-20 ENCOUNTER — Emergency Department (HOSPITAL_COMMUNITY)
Admission: EM | Admit: 2017-10-20 | Discharge: 2017-10-20 | Disposition: A | Discharge status: Home / Self Care | Payer: PPO | Attending: Emergency Medicine | Admitting: Emergency Medicine

## 2017-10-20 ENCOUNTER — Telehealth: Payer: Self-pay | Admitting: *Deleted

## 2017-10-20 ENCOUNTER — Emergency Department (HOSPITAL_COMMUNITY): Payer: PPO

## 2017-10-20 ENCOUNTER — Other Ambulatory Visit: Payer: Self-pay

## 2017-10-20 ENCOUNTER — Encounter (HOSPITAL_COMMUNITY): Payer: Self-pay | Admitting: Emergency Medicine

## 2017-10-20 DIAGNOSIS — G510 Bell's palsy: Secondary | ICD-10-CM | POA: Diagnosis not present

## 2017-10-20 DIAGNOSIS — H538 Other visual disturbances: Secondary | ICD-10-CM | POA: Diagnosis not present

## 2017-10-20 DIAGNOSIS — Z79899 Other long term (current) drug therapy: Secondary | ICD-10-CM | POA: Insufficient documentation

## 2017-10-20 DIAGNOSIS — F1410 Cocaine abuse, uncomplicated: Secondary | ICD-10-CM | POA: Insufficient documentation

## 2017-10-20 DIAGNOSIS — Z794 Long term (current) use of insulin: Secondary | ICD-10-CM | POA: Diagnosis not present

## 2017-10-20 DIAGNOSIS — N183 Chronic kidney disease, stage 3 (moderate): Secondary | ICD-10-CM | POA: Diagnosis not present

## 2017-10-20 DIAGNOSIS — F17210 Nicotine dependence, cigarettes, uncomplicated: Secondary | ICD-10-CM | POA: Diagnosis not present

## 2017-10-20 DIAGNOSIS — E1122 Type 2 diabetes mellitus with diabetic chronic kidney disease: Secondary | ICD-10-CM | POA: Diagnosis not present

## 2017-10-20 DIAGNOSIS — I129 Hypertensive chronic kidney disease with stage 1 through stage 4 chronic kidney disease, or unspecified chronic kidney disease: Secondary | ICD-10-CM | POA: Diagnosis not present

## 2017-10-20 DIAGNOSIS — Z7901 Long term (current) use of anticoagulants: Secondary | ICD-10-CM | POA: Insufficient documentation

## 2017-10-20 DIAGNOSIS — F17290 Nicotine dependence, other tobacco product, uncomplicated: Secondary | ICD-10-CM | POA: Insufficient documentation

## 2017-10-20 DIAGNOSIS — Z950 Presence of cardiac pacemaker: Secondary | ICD-10-CM | POA: Insufficient documentation

## 2017-10-20 DIAGNOSIS — Z8052 Family history of malignant neoplasm of bladder: Secondary | ICD-10-CM | POA: Diagnosis not present

## 2017-10-20 DIAGNOSIS — R29810 Facial weakness: Secondary | ICD-10-CM | POA: Diagnosis not present

## 2017-10-20 LAB — I-STAT CHEM 8, ED
BUN: 26 mg/dL — AB (ref 6–20)
Calcium, Ion: 1.3 mmol/L (ref 1.15–1.40)
Chloride: 107 mmol/L (ref 101–111)
Creatinine, Ser: 1.5 mg/dL — ABNORMAL HIGH (ref 0.61–1.24)
Glucose, Bld: 211 mg/dL — ABNORMAL HIGH (ref 65–99)
HEMATOCRIT: 49 % (ref 39.0–52.0)
HEMOGLOBIN: 16.7 g/dL (ref 13.0–17.0)
POTASSIUM: 4.9 mmol/L (ref 3.5–5.1)
SODIUM: 138 mmol/L (ref 135–145)
TCO2: 22 mmol/L (ref 22–32)

## 2017-10-20 LAB — COMPREHENSIVE METABOLIC PANEL
ALBUMIN: 3.9 g/dL (ref 3.5–5.0)
ALK PHOS: 69 U/L (ref 38–126)
ALT: 24 U/L (ref 17–63)
AST: 24 U/L (ref 15–41)
Anion gap: 10 (ref 5–15)
BUN: 23 mg/dL — AB (ref 6–20)
CALCIUM: 9.9 mg/dL (ref 8.9–10.3)
CHLORIDE: 106 mmol/L (ref 101–111)
CO2: 20 mmol/L — AB (ref 22–32)
CREATININE: 1.57 mg/dL — AB (ref 0.61–1.24)
GFR calc non Af Amer: 43 mL/min — ABNORMAL LOW (ref >60)
GFR, EST AFRICAN AMERICAN: 49 mL/min — AB (ref >60)
GLUCOSE: 211 mg/dL — AB (ref 65–99)
Potassium: 4.7 mmol/L (ref 3.5–5.1)
SODIUM: 136 mmol/L (ref 135–145)
Total Bilirubin: 0.8 mg/dL (ref 0.3–1.2)
Total Protein: 7.4 g/dL (ref 6.5–8.1)

## 2017-10-20 LAB — CBC WITH DIFFERENTIAL/PLATELET
BASOS ABS: 0 10*3/uL (ref 0.0–0.1)
BASOS PCT: 0 %
EOS ABS: 0.2 10*3/uL (ref 0.0–0.7)
EOS PCT: 2 %
HCT: 46.7 % (ref 39.0–52.0)
HEMOGLOBIN: 16 g/dL (ref 13.0–17.0)
LYMPHS ABS: 4 10*3/uL (ref 0.7–4.0)
Lymphocytes Relative: 44 %
MCH: 27.4 pg (ref 26.0–34.0)
MCHC: 34.3 g/dL (ref 30.0–36.0)
MCV: 80 fL (ref 78.0–100.0)
Monocytes Absolute: 0.7 10*3/uL (ref 0.1–1.0)
Monocytes Relative: 8 %
Neutro Abs: 4 10*3/uL (ref 1.7–7.7)
Neutrophils Relative %: 46 %
PLATELETS: 144 10*3/uL — AB (ref 150–400)
RBC: 5.84 MIL/uL — AB (ref 4.22–5.81)
RDW: 15 % (ref 11.5–15.5)
WBC: 8.9 10*3/uL (ref 4.0–10.5)

## 2017-10-20 LAB — TROPONIN I: Troponin I: 0.03 ng/mL (ref <0.03)

## 2017-10-20 LAB — PROTIME-INR
INR: 1.16
PROTHROMBIN TIME: 14.7 s (ref 11.4–15.2)

## 2017-10-20 LAB — ETHANOL: Alcohol, Ethyl (B): <10 mg/dL (ref <10)

## 2017-10-20 LAB — APTT: aPTT: 39 seconds — ABNORMAL HIGH (ref 24–36)

## 2017-10-20 MED ORDER — ACYCLOVIR 400 MG PO TABS: 800.0000 mg | ORAL_TABLET | Freq: Every day | ORAL | 0 refills | Status: DC

## 2017-10-20 MED ORDER — PREDNISONE 20 MG PO TABS: 40.0000 mg | ORAL_TABLET | Freq: Every day | ORAL | 0 refills | Status: DC

## 2017-10-20 NOTE — ED Triage Notes (Signed)
Pt has a left sided facial droop. Stated this started at 4pm yesterday. No other complaints. Pt states when he drinks water spills from his mouth. Takes Eliquis

## 2017-10-20 NOTE — Consult Note (Addendum)
[facility] Consult Services Date of service: 10/20/2017  Impression: 72 year old male who presents with left upper and lower facial droop likely due to bell's palsy.     Not a tpa candidate due to: Last seen normal more than 4.5 hours prior to arrival Not an NIR candidate due to: Symptoms unlikely related to LVO   Differential Diagnosis:   1. Bell's Palsy 2. Less likely -- small brain stem infarct  Comments:  STAT consult  Recommendations:  -Continue home Eliquis -Can consider steroid taper with Prednisone 60 mg x 5 days then 50 mg x 1 day, 40 mg x 1 day, 30 mg x 1 day, 20 mg x 1 day, 10 mg x 1 day then stop.  -Can consider Acyclovir x 7 days inpatient neurology consultation Inpatient stroke evaluation as per Neurology/ Internal Medicine Discussed with ED MD  -----------------------------------------------------------------------------------------  CC: Bell's Palsy  History of Present Illness   Patient is a 72 year old male with a history of atrial fibrillation on Eliquis and pacemaker who presents with left upper and lower facial droop. Patient was out working out in the garden yesterday and when he came inside he noted left facial droop. He did not have any other new focal weakness or numbness. He did complain of some numbness in the middle left face and blurred vision in his left eye. He also notes that he is unable to close his left eye and it seems very dry.   CT brain showed multiple chronic infarcts and chronic white matter changes but no new strokes.   Diagnostic: CT Brain w/o contrast: IMPRESSION: No acute finding by CT. Widespread chronic small-vessel ischemic changes of the hemispheric white matter. 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 Old left posterior temporal cortical and subcortical infarction.  Exam:  NIHSS score: 5      Medical Decision Making:  - Extensive number of diagnosis or management options are considered  above.   - Extensive amount of complex data reviewed.   - High risk of complication and/or morbidity or mortality are associated with differential diagnostic considerations above.  - There may be Uncertain outcome and increased probability of prolonged functional impairment or high probability of severe prolonged functional impairment associated with some of these differential diagnosis.  Medical Data Reviewed:  1.Data reviewed include clinical labs, radiology,  Medical Tests;   2.Tests results discussed w/performing or interpreting physician;   3.Obtaining/reviewing old medical records;  4.Obtaining case history from another source;  5.Independent review of image, tracing or specimen.    Patient was informed the Neurology Consult would happen via telehealth (remote video) and consented to receiving care in this manner.     NIH Stroke Scale   Interval: Baseline Time: 4:31 PM Person Administering Scale: Tsosie Billing  Administer stroke scale items in the order listed. Record performance in each category after each subscale exam. Do not go back and change scores. Follow directions provided for each exam technique. Scores should reflect what the patient does, not what the clinician thinks the patient can do. The clinician should record answers while administering the exam and work quickly. Except where indicated, the patient should not be coached (i.e., repeated requests to patient to make a special effort).   1a  Level of consciousness: 0=alert; keenly responsive  1b. LOC questions:  0=Performs both tasks correctly  1c. LOC commands: 0=Performs both tasks correctly  2.  Best Gaze: 0=normal  3.  Visual: 0=No visual loss  4. Facial Palsy:  3=Complete paralysis of one or both sides (absence of facial movement in the upper and lower face)  5a.  Motor left arm: 0=No drift, limb holds 90 (or 45) degrees for full 10 seconds  5b.  Motor right arm: 0=No drift, limb holds 90 (or 45) degrees  for full 10 seconds  6a. motor left leg: 0=No drift, limb holds 90 (or 45) degrees for full 10 seconds  6b  Motor right leg:  0=No drift, limb holds 90 (or 45) degrees for full 10 seconds  7. Limb Ataxia: 0=Absent  8.  Sensory: 1=Mild to moderate sensory loss; patient feels pinprick is less sharp or is dull on the affected side; there is a loss of superficial pain with pinprick but patient is aware He is being touched  9. Best Language:  0=No aphasia, normal  10. Dysarthria: 1=Mild to moderate, patient slurs at least some words and at worst, can be understood with some difficulty  11. Extinction and Inattention: 0=No abnormality  12. Distal motor function: 0=Normal   Total:   5  Note -- dysarthria is because of facial droop

## 2017-10-20 NOTE — ED Provider Notes (Signed)
[facility] EMERGENCY DEPARTMENT Provider Note   CSN: [id number] Arrival date & time: 10/20/17  1508     History   Chief Complaint Chief Complaint  Patient presents with  . Facial Droop    HPI Dylan Tyler is a 72 y.o. male.  HPI  The patient is a 72 year old male, has a known history of multiple medical problems including pacemaker, atrial fibrillation, aortic root dilation, nonischemic cardiomyopathy with an ejection fraction of 55% and rheumatoid arthritis, he is a diabetic requiring insulin and has a history of tobacco use.  The patient reports that he has never had a stroke, he came into the house yesterday approximately 23 hours ago from working out in the yard and noticed that his face was drooping and he was having difficulty swallowing water, he decided to come to the hospital today because it had not gotten any better.  We are not sure about the time that it started but he first noticed it at 5:00 yesterday afternoon when he went into the house from the yard.  He has also reported some blurred vision in his left eye as well as some difficulty with his left leg feeling like he is not able to walk as well with his leg like it is going to give out.  He reports intermittent problems with this over time but seems to have been worse today.  He denies chest pain shortness of breath fevers coughing swelling of the legs and has no numbness or weakness except for the left side of his face which does feel numb to the touch.  His speech is unchanged and according to his wife who is an additional historian he has not had any difficulty with his mental status memory or speaking.  Past Medical History:  Diagnosis Date  . Ankylosing spondylitis ([place])   . Anxiety   . Aortic root dilatation (HCC)    Mild  . AR (aortic regurgitation)    Mild  . Atrial fibrillation and flutter ([place])   . Bladder cancer ([place])   . Chronic pain    Followed at [facility]  . DDD (degenerative disc disease),  lumbosacral   . DDD (degenerative disc disease), thoracolumbar   . Degenerative cervical disc   . Depression   . Diabetes mellitus type II   . Diverticulitis   . Essential hypertension, benign   . GERD (gastroesophageal reflux disease)   . Hyperlipidemia   . Kidney stones   . Migraine   . Nonischemic cardiomyopathy ([place])    Cardiolite 12/09 LVEF 55%, minor luminal irregularities at cardiac catheterization  . OSA on CPAP   . Rheumatoid arthritis ([place])   . Symptomatic bradycardia    Medtronic Advisa L dual-chamber pacemaker  05/2015 - Dr. Curt Bears    Patient Active Problem List   Diagnosis Date Noted  . Uncontrolled type 2 diabetes mellitus with stage 3 chronic kidney disease ([place]) 03/24/2017  . Class 1 obesity due to excess calories with serious comorbidity and body mass index (BMI) of 34.0 to 34.9 in adult 03/24/2017  . Atrial fibrillation ([place]) 11/08/2015  . Atypical atrial flutter ([place])   . Heart block 05/21/2015  . Aortic valve disorder 01/01/2010  . TOBACCO ABUSE 12/13/2009  . CAROTID BRUIT 12/13/2009  . Recurrent chest pain 09/30/2009  . Mixed hyperlipidemia 02/27/2009  . Essential hypertension, benign 02/27/2009  . Atrial flutter ([place]) 02/27/2009    Past Surgical History:  Procedure Laterality Date  . CARDIAC CATHETERIZATION  08/13/11  . CARDIOVERSION N/A  07/02/2015   Procedure: CARDIOVERSION;  Surgeon: Sanda Klein, MD;  Location: [facility];  Service: Cardiovascular;  Laterality: N/A;  . CYSTOSCOPY WITH HOLMIUM LASER LITHOTRIPSY  1990's  . EP IMPLANTABLE DEVICE N/A 05/21/2015   MDT Advisa DR MRI pacemaker implanted by Dr Curt Bears  . KNEE ARTHROSCOPY Right ~ 1991  . LAPAROSCOPIC CHOLECYSTECTOMY  ~ 2008  . TRANSURETHRAL RESECTION OF BLADDER TUMOR WITH GYRUS (TURBT-GYRUS)  1990's?        Home Medications    Prior to Admission medications   Medication Sig Start Date End Date Taking? Authorizing Provider  acyclovir (ZOVIRAX) 400 MG tablet Take 2 tablets (800  mg total) by mouth 5 (five) times daily. 10/20/17   Noemi Chapel, MD  albuterol (PROVENTIL) (2.5 MG/3ML) 0.083% nebulizer solution Take 2.5 mg by nebulization every 6 (six) hours as needed for wheezing.    [provider]  apixaban (ELIQUIS) 5 MG TABS tablet Take 1 tablet (5 mg total) by mouth 2 (two) times daily. 06/08/17   [name], MD  carvedilol (COREG) 6.25 MG tablet Take 1 tablet (6.25 mg total) by mouth 2 (two) times daily with a meal. 02/13/16   Baldwin Jamaica, PA-C  Cholecalciferol (VITAMIN D-3) 1000 UNITS CAPS Take 1,000 Units by mouth 2 (two) times daily.     [provider]  cholestyramine (QUESTRAN) 4 g packet Take 4 g by mouth 3 (three) times daily with meals.    [provider]  citalopram (CELEXA) 40 MG tablet Take 20-40 mg by mouth daily.     [provider]  ELIQUIS 5 MG TABS tablet TAKE ONE TABLET BY MOUTH TWICE DAILY 09/15/17   [name], MD  gabapentin (NEURONTIN) 400 MG capsule Take 400 mg by mouth 3 (three) times daily.     [provider]  ibuprofen (ADVIL,MOTRIN) 200 MG tablet Take 400 mg by mouth every 6 (six) hours as needed for headache or moderate pain.    [provider]  insulin aspart (NOVOLOG FLEXPEN) 100 UNIT/ML FlexPen Inject 22-28 Units into the skin 3 (three) times daily with meals. 09/02/17   Cassandria Anger, MD  Insulin Glargine (LANTUS SOLOSTAR) 100 UNIT/ML Solostar Pen Inject 80 Units into the skin daily at 10 pm. 09/02/17   Nida, Marella Chimes, MD  Insulin Pen Needle (B-D ULTRAFINE III SHORT PEN) 31G X 8 MM MISC Use to inject insulin 4 x daily 06/25/17   Cassandria Anger, MD  Insulin Pen Needle (B-D ULTRAFINE III SHORT PEN) 31G X 8 MM MISC 1 each by Does not apply route as directed. 09/02/17   Cassandria Anger, MD  Insulin Pen Needle (PEN NEEDLES) 32G X 6 MM MISC 1 each by Does not apply route 4 (four) times daily.    [provider]  ipratropium-albuterol (DUONEB)  0.5-2.5 (3) MG/3ML SOLN Take 3 mLs by nebulization every 4 (four) hours as needed (for shortness of breath).    [provider]  methylPREDNISolone (MEDROL DOSEPAK) 4 MG TBPK tablet Take by mouth.    [provider]  metoCLOPramide (REGLAN) 5 MG tablet Take 5 mg by mouth every 6 (six) hours as needed for nausea or vomiting.     [provider]  nitroGLYCERIN (NITROSTAT) 0.4 MG SL tablet Place 1 tablet (0.4 mg total) under the tongue every 5 (five) minutes as needed for chest pain (up to 3 doses. If no relief after the 3 rd dose, proceed to the ED for an evaluation). May repeat  for up to 3 doses. 09/18/16   [name], MD  NON FORMULARY 1 each by Other route See admin instructions. CPAP - Uses nightly    [provider]  predniSONE (DELTASONE) 20 MG tablet Take 2 tablets (40 mg total) by mouth daily. 10/20/17   Noemi Chapel, MD  simvastatin (ZOCOR) 40 MG tablet Take 1 tablet (40 mg total) by mouth at bedtime. 09/18/16   [name], MD  vitamin B-12 (CYANOCOBALAMIN) 1000 MCG tablet Take 1,000 mcg by mouth 2 (two) times daily.      [provider]  VOLTAREN 1 % GEL Apply 2 g topically 4 (four) times daily as needed (for pain).  07/18/12   [provider]    Family History Family History  Problem Relation Age of Onset  . Heart attack Mother   . Cancer Father     Social History Social History   Tobacco Use  . Smoking status: Current Every Day Smoker    Packs/day: 0.30    Years: 50.00    Pack years: 15.00    Types: E-cigarettes    Start date: 02/26/1959    Last attempt to quit: 12/06/2013    Years since quitting: 3.8  . Smokeless tobacco: Former Systems developer    Types: Chew  . Tobacco comment: 05/21/2015 "quit chewing in 1992"  Substance Use Topics  . Alcohol use: Yes    Alcohol/week: 1.2 oz    Types: 2 Cans of beer per week  . Drug use: Yes    Types: Cocaine    Comment: 05/21/2015 "quit in the early 2000"     Allergies     Patient has no known allergies.   Review of Systems Review of Systems  All other systems reviewed and are negative.    Physical Exam Updated Vital Signs BP (!) 154/85   Pulse 67   Temp (!) 97 F (36.1 C) (Oral)   Resp 20   Ht 5\' 9"  (1.753 m)   Wt 108.9 kg (240 lb)   SpO2 96%   BMI 35.44 kg/m   Physical Exam  Constitutional: He appears well-developed and well-nourished. No distress.  HENT:  Head: Normocephalic and atraumatic.  Mouth/Throat: Oropharynx is clear and moist. No oropharyngeal exudate.  Eyes: Pupils are equal, round, and reactive to light. Conjunctivae and EOM are normal. Right eye exhibits no discharge. Left eye exhibits no discharge. No scleral icterus.  Neck: Normal range of motion. Neck supple. No JVD present. No thyromegaly present.  Cardiovascular: Normal rate, regular rhythm, normal heart sounds and intact distal pulses. Exam reveals no gallop and no friction rub.  No murmur heard. Pulmonary/Chest: Effort normal and breath sounds normal. No respiratory distress. He has no wheezes. He has no rales.  Abdominal: Soft. Bowel sounds are normal. He exhibits no distension and no mass. There is no tenderness.  Musculoskeletal: Normal range of motion. He exhibits no edema or tenderness.  Lymphadenopathy:    He has no cervical adenopathy.  Neurological: He is alert. Coordination normal.  The patient has left-sided facial droop with dense droop - inability to lift L eyebrow / forehead, and the inability to close the left eye very well.  He has normal strength and sensation of the bilateral upper extremities, his lower extremities appear to have normal strength and sensation as well.  There is normal coordination by finger-nose-finger and no pronator drift.  Speech is clear given the limitations of the facial droop.  The patient has normal ability to extend  his tongue and move from left to right, cranial nerves III through XII are otherwise intact.  His vision is grossly  blurry in the left though it he states it is just mild.  His left side of his face has a slight decrease sensation.  Skin: Skin is warm and dry. No rash noted. No erythema.  Psychiatric: He has a normal mood and affect. His behavior is normal.  Nursing note and vitals reviewed.    ED Treatments / Results  Labs (all labs ordered are listed, but only abnormal results are displayed) Labs Reviewed  I-STAT CHEM 8, ED - Abnormal; Notable for the following components:      Result Value   BUN 26 (*)    Creatinine, Ser 1.50 (*)    Glucose, Bld 211 (*)    All other components within normal limits  CBC WITH DIFFERENTIAL/PLATELET  COMPREHENSIVE METABOLIC PANEL  ETHANOL  PROTIME-INR  APTT  RAPID URINE DRUG SCREEN, HOSP PERFORMED  URINALYSIS, ROUTINE W REFLEX MICROSCOPIC  TROPONIN I    EKG EKG Interpretation  Date/Time:  Wednesday October 20 2017 15:41:26 EDT Ventricular Rate:  74 PR Interval:    QRS Duration: 158 QT Interval:  422 QTC Calculation: 469 R Axis:   -81 Text Interpretation:  Afib/flutter and ventricular-paced rhythm No further analysis attempted due to paced rhythm since last tracing no significant change Confirmed by Noemi Chapel [phone]) on 10/20/2017 3:47:28 PM   Radiology Ct Head Wo Contrast  Result Date: 10/20/2017 CLINICAL DATA:  Left facial droop beginning yesterday. Pacemaker. Some blurred vision. EXAM: CT HEAD WITHOUT CONTRAST TECHNIQUE: Contiguous axial images were obtained from the base of the skull through the vertex without intravenous contrast. COMPARISON:  None. FINDINGS: Brain: Generalized atrophy. No focal insult is seen affecting the brainstem or cerebellum. Right cerebral hemisphere shows an old right frontal cortical and subcortical infarction and chronic small-vessel ischemic changes of the white matter. Left hemisphere shows an old left posterior temporal cortical and subcortical infarction and chronic small-vessel changes of the white matter. No sign of  acute infarction, mass lesion, hemorrhage, hydrocephalus or extra-axial collection. Vascular: There is atherosclerotic calcification of the major vessels at the base of the brain. Skull: Negative Sinuses/Orbits: Clear/normal Other: None IMPRESSION: No acute finding by CT. Widespread chronic small-vessel ischemic changes of the hemispheric white matter. 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 Old left posterior temporal cortical and subcortical infarction. Electronically Signed   By: Nelson Chimes M.D.   On: 10/20/2017 15:36    Procedures Procedures (including critical care time)  Medications Ordered in ED Medications - No data to display   Initial Impression / Assessment and Plan / ED Course  I have reviewed the triage vital signs and the nursing notes.  Pertinent labs & imaging results that were available during my care of the patient were reviewed by me and considered in my medical decision making (see chart for details).  Clinical Course as of Oct 21 1655  Wed Oct 20, 2017  1617 D/w Neuro - they will see the pt at this time   [BM]  1617 Pt on eliquis   [BM]  1653 Neurology has seen the patient and agrees that the patient likely has a Bell's palsy and recommends prednisone therapy however the patient is a diabetic, I will have this discussion with the patient.  Labs at this time show that the patient has a normal metabolic panel.  Other than mild renal insufficiency which she is known to have.   [  BM]  1654 I-Stat Chem 8, ED(!) [BM]  1654 CT scan of the brain shows old infarcts but nothing new.  The patient may remain on his Eliquis according to the neurologist who agrees that this would be unlikely to cause hemorrhagic transformation of any small strokes.   [BM]    Clinical Course User Index [BM] Noemi Chapel, MD   The patient's presentation is consistent with a Bell's palsy however there are some factors that make this slightly abnormal and atypical including left leg  historical weakness today as well as left-sided visual blurriness, it is not bilateral visual blurriness making me think it is more likely related to his inability to blink however the patient will need to have further evaluation and a neurology evaluation as well.  The patient has a pacemaker and cannot undergo MRI unfortunately.  Final Clinical Impressions(s) / ED Diagnoses   Final diagnoses:  Bell palsy    ED Discharge Orders        Ordered    predniSONE (DELTASONE) 20 MG tablet  Daily     10/20/17 1654    acyclovir (ZOVIRAX) 400 MG tablet  5 times daily     10/20/17 1654       Noemi Chapel, MD 10/20/17 1657

## 2017-10-20 NOTE — ED Notes (Signed)
CRITICAL VALUE ALERT  Critical Value:  Troponin 0.03  Date & Time Notied:  10/20/2017, 1800  Provider Notified: Dr. Sabra Heck   Orders Received/Actions taken:

## 2017-10-20 NOTE — ED Notes (Signed)
ED Provider with pt in triage

## 2017-10-20 NOTE — Telephone Encounter (Signed)
Wife calling with c/o of face / eye / eyebrow drooping on left side with weakness.  Was just getting out of shower & states that drooping seems to be more noticeable as we speak.  Advised she take take him to ED immediately as these could be symptoms of stroke.  Stated that she will bring him to [facility] as they are closer to her location.

## 2017-10-20 NOTE — ED Notes (Signed)
Failed swallow screening.

## 2017-10-20 NOTE — Discharge Instructions (Signed)
Prednisone daily for 5 days - 40mg  daily  Acyclovir 800mg  5 times daily  See your doctor in the next 3 days  ER for worsening symptoms.

## 2017-10-21 NOTE — Progress Notes (Signed)
CODE STROKE 1521 CALL\ 1523 BEEPER [place]

## 2017-10-25 ENCOUNTER — Other Ambulatory Visit: Payer: Self-pay | Admitting: Cardiology

## 2017-10-26 DIAGNOSIS — G510 Bell's palsy: Secondary | ICD-10-CM | POA: Diagnosis not present

## 2017-10-26 DIAGNOSIS — R51 Headache: Secondary | ICD-10-CM | POA: Diagnosis not present

## 2017-10-26 DIAGNOSIS — I739 Peripheral vascular disease, unspecified: Secondary | ICD-10-CM | POA: Diagnosis not present

## 2017-10-26 DIAGNOSIS — Z6834 Body mass index (BMI) 34.0-34.9, adult: Secondary | ICD-10-CM | POA: Diagnosis not present

## 2017-10-26 DIAGNOSIS — I4891 Unspecified atrial fibrillation: Secondary | ICD-10-CM | POA: Diagnosis not present

## 2017-10-26 DIAGNOSIS — J449 Chronic obstructive pulmonary disease, unspecified: Secondary | ICD-10-CM | POA: Diagnosis not present

## 2017-10-26 DIAGNOSIS — E1165 Type 2 diabetes mellitus with hyperglycemia: Secondary | ICD-10-CM | POA: Diagnosis not present

## 2017-10-26 DIAGNOSIS — E1142 Type 2 diabetes mellitus with diabetic polyneuropathy: Secondary | ICD-10-CM | POA: Diagnosis not present

## 2017-10-27 NOTE — Progress Notes (Signed)
Cardiology Office Note  Date: 10/29/2017   ID: Dylan, Tyler [birth year], MRN [id number]  PCP: Dylan Chroman, MD  Primary Cardiologist: Dylan Lesches, MD   Chief Complaint  Patient presents with  . Atrial Fibrillation    History of Present Illness: Dylan Tyler is a 72 y.o. male last seen in September 2018.  He is here with his wife for a follow-up visit.  Reports no palpitations or chest pain.  He was recently diagnosed with Bell's palsy and seen in the ER.  I reviewed his lab work.  He continues to follow with Dr. Rayann Tyler in the device clinic, Medtronic pacemaker in place.  He has had no dizziness or syncope.  We went over his medications.  He continues on Eliquis, Coreg, and Zocor.  Past Medical History:  Diagnosis Date  . Ankylosing spondylitis ([place])   . Anxiety   . Aortic root dilatation (HCC)    Mild  . AR (aortic regurgitation)    Mild  . Atrial fibrillation and flutter ([place])   . Bladder cancer ([place])   . Chronic pain    Followed at [facility]  . DDD (degenerative disc disease), lumbosacral   . DDD (degenerative disc disease), thoracolumbar   . Degenerative cervical disc   . Depression   . Diabetes mellitus type II   . Diverticulitis   . Essential hypertension, benign   . GERD (gastroesophageal reflux disease)   . Hyperlipidemia   . Kidney stones   . Migraine   . Nonischemic cardiomyopathy ([place])    Cardiolite 12/09 LVEF 55%, minor luminal irregularities at cardiac catheterization  . OSA on CPAP   . Rheumatoid arthritis ([place])   . Symptomatic bradycardia    Medtronic Advisa L dual-chamber pacemaker  05/2015 - Dr. Curt Tyler    Past Surgical History:  Procedure Laterality Date  . CARDIAC CATHETERIZATION  08/13/11  . CARDIOVERSION N/A 07/02/2015   Procedure: CARDIOVERSION;  Surgeon: Sanda Klein, MD;  Location: [facility];  Service: Cardiovascular;  Laterality: N/A;  . CYSTOSCOPY WITH HOLMIUM LASER LITHOTRIPSY  1990's  . EP IMPLANTABLE DEVICE N/A  05/21/2015   MDT Advisa DR MRI pacemaker implanted by Dr Dylan Tyler  . KNEE ARTHROSCOPY Right ~ 1991  . LAPAROSCOPIC CHOLECYSTECTOMY  ~ 2008  . TRANSURETHRAL RESECTION OF BLADDER TUMOR WITH GYRUS (TURBT-GYRUS)  1990's?    Current Outpatient Medications  Medication Sig Dispense Refill  . acyclovir (ZOVIRAX) 400 MG tablet Take 2 tablets (800 mg total) by mouth 5 (five) times daily. 50 tablet 0  . albuterol (PROVENTIL) (2.5 MG/3ML) 0.083% nebulizer solution Take 2.5 mg by nebulization every 6 (six) hours as needed for wheezing.    Marland Kitchen apixaban (ELIQUIS) 5 MG TABS tablet Take 1 tablet (5 mg total) by mouth 2 (two) times daily. 42 tablet 0  . carvedilol (COREG) 6.25 MG tablet Take 1 tablet (6.25 mg total) by mouth 2 (two) times daily with a meal. 180 tablet 2  . Cholecalciferol (VITAMIN D-3) 1000 UNITS CAPS Take 1,000 Units by mouth 2 (two) times daily.     . cholestyramine (QUESTRAN) 4 g packet Take 4 g by mouth 3 (three) times daily with meals.    . citalopram (CELEXA) 40 MG tablet Take 20-40 mg by mouth daily.     Marland Kitchen gabapentin (NEURONTIN) 400 MG capsule Take 400 mg by mouth 3 (three) times daily.     Marland Kitchen ibuprofen (ADVIL,MOTRIN) 200 MG tablet Take 400 mg by mouth every 6 (six) hours as needed for  headache or moderate pain.    Marland Kitchen insulin aspart (NOVOLOG FLEXPEN) 100 UNIT/ML FlexPen Inject 22-28 Units into the skin 3 (three) times daily with meals. 10 pen 2  . Insulin Glargine (LANTUS SOLOSTAR) 100 UNIT/ML Solostar Pen Inject 80 Units into the skin daily at 10 pm. 10 pen 2  . Insulin Pen Needle (B-D ULTRAFINE III SHORT PEN) 31G X 8 MM MISC Use to inject insulin 4 x daily 200 each 3  . Insulin Pen Needle (B-D ULTRAFINE III SHORT PEN) 31G X 8 MM MISC 1 each by Does not apply route as directed. 150 each 3  . Insulin Pen Needle (PEN NEEDLES) 32G X 6 MM MISC 1 each by Does not apply route 4 (four) times daily.    Marland Kitchen ipratropium-albuterol (DUONEB) 0.5-2.5 (3) MG/3ML SOLN Take 3 mLs by nebulization every 4 (four)  hours as needed (for shortness of breath).    . metoCLOPramide (REGLAN) 5 MG tablet Take 5 mg by mouth every 6 (six) hours as needed for nausea or vomiting.     . nitroGLYCERIN (NITROSTAT) 0.4 MG SL tablet Place 1 tablet (0.4 mg total) under the tongue every 5 (five) minutes as needed for chest pain (up to 3 doses. If no relief after the 3 rd dose, proceed to the ED for an evaluation). May repeat for up to 3 doses. 25 tablet 3  . NON FORMULARY 1 each by Other route See admin instructions. CPAP - Uses nightly    . simvastatin (ZOCOR) 40 MG tablet TAKE ONE TABLET BY MOUTH AT BEDTIME 90 tablet 1  . vitamin B-12 (CYANOCOBALAMIN) 1000 MCG tablet Take 1,000 mcg by mouth 2 (two) times daily.      . VOLTAREN 1 % GEL Apply 2 g topically 4 (four) times daily as needed (for pain).      No current facility-administered medications for this visit.    Allergies:  Patient has no known allergies.   Social History: The patient  reports that he has been smoking e-cigarettes.  He started smoking about 58 years ago. He has a 15.00 pack-year smoking history. He has quit using smokeless tobacco. His smokeless tobacco use included chew. He reports that he drinks about 1.2 oz of alcohol per week. He reports that he has current or past drug history. Drug: Cocaine.   ROS:  Please see the history of present illness. Otherwise, complete review of systems is positive for left facial droop due to Bell's palsy.  All other systems are reviewed and negative.   Physical Exam: VS:  BP 138/72   Pulse 78   Ht 5\' 9"  (1.753 m)   Wt 244 lb (110.7 kg)   SpO2 98%   BMI 36.03 kg/m , BMI Body mass index is 36.03 kg/m.  Wt Readings from Last 3 Encounters:  10/29/17 244 lb (110.7 kg)  10/20/17 240 lb (108.9 kg)  09/02/17 240 lb (108.9 kg)    General: Patient appears comfortable at rest.  Uses a cane. HEENT: Conjunctiva normal, left facial droop with recent diagnosis of Bell's palsy Neck: Supple, no elevated JVP or carotid  bruits, no thyromegaly. Lungs: Clear to auscultation, nonlabored breathing at rest. Cardiac: Regular rate and rhythm, no S3 or significant systolic murmur, no pericardial rub. Abdomen: Soft, nontender, bowel sounds present. Extremities: Mild lower leg edema, distal pulses 2+. Skin: Warm and dry. Musculoskeletal: No kyphosis. Neuropsychiatric: Alert and oriented x3, affect grossly appropriate.  ECG: I personally reviewed the tracing from 10/21/2017 which showed atrial fibrillation with  a ventricular paced rhythm.  Recent Labwork: 10/20/2017: ALT 24; AST 24; BUN 26; Creatinine, Ser 1.50; Hemoglobin 16.7; Platelets 144; Potassium 4.9; Sodium 138   Other Studies Reviewed Today:  Echocardiogram 04/04/2015: Study Conclusions  - Left ventricle: The cavity size was normal. Wall thickness was increased increased in a pattern of mild to moderate LVH. Systolic function was normal. The estimated ejection fraction was in the range of 50% to 55%. Heart rhythm is coarse atrial fibrillation/flutter. Although no diagnostic regional wall motion abnormality was identified, this possibility cannot be completely excluded on the basis of this study. The study is not technically sufficient to allow evaluation of LV diastolic function. - Aortic valve: Mildly calcified annulus. Trileaflet. There was mild regurgitation. - Aortic root: The aortic root was mildly dilated. - Mitral valve: There was trivial regurgitation. - Left atrium: The atrium was mildly to moderately dilated. - Right ventricle: The cavity size was normal. - Right atrium: The atrium was mildly dilated. Central venous pressure (est): 3 mm Hg. - Atrial septum: There was a patent foramen ovale with left to right shunt by color Doppler. - Tricuspid valve: There was trivial regurgitation. - Pulmonary arteries: PA peak pressure: 23 mm Hg (S). - Pericardium, extracardiac: A trivial pericardial effusion  was identified.  Impressions:  - Mild to moderate LVH with LVEF 50-55%, indeterminate diastolic function. Mild to moderate left atrial enlargement. Mild aortic regurgitation. Mildly dilated aortic root. Trivial tricuspid regurgitation with PASP 23 mmHg. PFO with right to left shunt by color Doppler. RV contraction is normal.  Assessment and Plan:  1.  History of atrial fibrillation/flutter, doing well without palpitations.  He continues on Eliquis for stroke prophylaxis and Coreg.  I reviewed his recent lab work.  He has had no bleeding problems.  2.  History of nonischemic cardiomyopathy with improvement in LVEF to the range of 50-55%.  3.  Symptomatic bradycardia status post Medtronic pacemaker.  No dizziness or syncope.  He continues to follow with Dr. Rayann Tyler.  4.  History of hypertension.  Currently off lisinopril.  Current medicines were reviewed with the patient today.  Disposition: Follow-up in 6 months.  Signed, [name], MD, [facility] 10/29/2017 1:43 PM    [facility], [place], [place] [address] Phone: [phone]; Fax: [phone]

## 2017-10-29 ENCOUNTER — Encounter: Payer: Self-pay | Admitting: Cardiology

## 2017-10-29 ENCOUNTER — Ambulatory Visit: Payer: PPO | Admitting: Cardiology

## 2017-10-29 VITALS — BP 138/72 | HR 78 | Ht 69.0 in | Wt 244.0 lb

## 2017-10-29 DIAGNOSIS — I482 Chronic atrial fibrillation, unspecified: Secondary | ICD-10-CM

## 2017-10-29 DIAGNOSIS — I429 Cardiomyopathy, unspecified: Secondary | ICD-10-CM

## 2017-10-29 DIAGNOSIS — I10 Essential (primary) hypertension: Secondary | ICD-10-CM | POA: Diagnosis not present

## 2017-10-29 DIAGNOSIS — Z950 Presence of cardiac pacemaker: Secondary | ICD-10-CM | POA: Diagnosis not present

## 2017-10-29 NOTE — Patient Instructions (Signed)

## 2017-11-01 ENCOUNTER — Other Ambulatory Visit: Payer: Self-pay | Admitting: "Endocrinology

## 2017-11-04 ENCOUNTER — Telehealth: Payer: Self-pay | Admitting: Cardiology

## 2017-11-04 ENCOUNTER — Ambulatory Visit (INDEPENDENT_AMBULATORY_CARE_PROVIDER_SITE_OTHER): Payer: PPO | Admitting: *Deleted

## 2017-11-04 DIAGNOSIS — I482 Chronic atrial fibrillation, unspecified: Secondary | ICD-10-CM

## 2017-11-04 NOTE — Telephone Encounter (Signed)
Spoke with pt and reminded pt of remote transmission that is due today. Pt verbalized understanding.   

## 2017-11-05 ENCOUNTER — Encounter: Payer: Self-pay | Admitting: Internal Medicine

## 2017-11-05 ENCOUNTER — Ambulatory Visit: Payer: PPO | Admitting: Internal Medicine

## 2017-11-05 VITALS — BP 130/70 | HR 72 | Ht 69.0 in | Wt 239.0 lb

## 2017-11-05 DIAGNOSIS — I459 Conduction disorder, unspecified: Secondary | ICD-10-CM

## 2017-11-05 DIAGNOSIS — I481 Persistent atrial fibrillation: Secondary | ICD-10-CM | POA: Diagnosis not present

## 2017-11-05 DIAGNOSIS — I10 Essential (primary) hypertension: Secondary | ICD-10-CM

## 2017-11-05 DIAGNOSIS — I4819 Other persistent atrial fibrillation: Secondary | ICD-10-CM

## 2017-11-05 LAB — CUP PACEART INCLINIC DEVICE CHECK
Battery Remaining Longevity: 56 mo
Battery Voltage: 2.99 V
Brady Statistic AP VP Percent: 0 %
Brady Statistic AS VP Percent: 94.6 %
Brady Statistic RV Percent Paced: 95.66 %
Date Time Interrogation Session: 20190503140044
Implantable Lead Implant Date: 20161115
Implantable Lead Location: 753860
Implantable Lead Model: 5076
Implantable Lead Model: 5076
Lead Channel Impedance Value: 361 Ohm
Lead Channel Impedance Value: 456 Ohm
Lead Channel Impedance Value: 494 Ohm
Lead Channel Pacing Threshold Amplitude: 0.5 V
Lead Channel Pacing Threshold Amplitude: 0.625 V
Lead Channel Sensing Intrinsic Amplitude: 0.5 mV
Lead Channel Sensing Intrinsic Amplitude: 2 mV
Lead Channel Sensing Intrinsic Amplitude: 7.25 mV
Lead Channel Setting Pacing Amplitude: 2.5 V
MDC IDC LEAD IMPLANT DT: 20161115
MDC IDC LEAD LOCATION: 753859
MDC IDC MSMT LEADCHNL RA PACING THRESHOLD PULSEWIDTH: 0.4 ms
MDC IDC MSMT LEADCHNL RV IMPEDANCE VALUE: 361 Ohm
MDC IDC MSMT LEADCHNL RV PACING THRESHOLD PULSEWIDTH: 0.4 ms
MDC IDC MSMT LEADCHNL RV SENSING INTR AMPL: 10.125 mV
MDC IDC PG IMPLANT DT: 20161115
MDC IDC SET LEADCHNL RV PACING PULSEWIDTH: 0.4 ms
MDC IDC SET LEADCHNL RV SENSING SENSITIVITY: 2.8 mV
MDC IDC STAT BRADY AP VS PERCENT: 0 %
MDC IDC STAT BRADY AS VS PERCENT: 5.4 %
MDC IDC STAT BRADY RA PERCENT PACED: 0 %

## 2017-11-05 NOTE — Progress Notes (Signed)
PCP: Glenda Chroman, MD Primary Cardiologist: Domenic Polite Primary EP:  Dr Odette Fraction Dylan Tyler is a 72 y.o. male who presents today for routine electrophysiology followup.  Since last being seen in our clinic, the patient reports doing reasonably well.  Primarily limited by back, leg, foot pain.  No cardiac concerns today.  Today, he denies symptoms of palpitations, chest pain, shortness of breath,  lower extremity edema, dizziness, presyncope, or syncope.  The patient is otherwise without complaint today.   Past Medical History:  Diagnosis Date  . Ankylosing spondylitis ([place])   . Anxiety   . Aortic root dilatation (HCC)    Mild  . AR (aortic regurgitation)    Mild  . Atrial fibrillation and flutter ([place])   . Bladder cancer ([place])   . Chronic pain    Followed at [facility]  . DDD (degenerative disc disease), lumbosacral   . DDD (degenerative disc disease), thoracolumbar   . Degenerative cervical disc   . Depression   . Diabetes mellitus type II   . Diverticulitis   . Essential hypertension, benign   . GERD (gastroesophageal reflux disease)   . Hyperlipidemia   . Kidney stones   . Migraine   . Nonischemic cardiomyopathy ([place])    Cardiolite 12/09 LVEF 55%, minor luminal irregularities at cardiac catheterization  . OSA on CPAP   . Rheumatoid arthritis ([place])   . Symptomatic bradycardia    Medtronic Advisa L dual-chamber pacemaker  05/2015 - Dr. Curt Bears   Past Surgical History:  Procedure Laterality Date  . CARDIAC CATHETERIZATION  08/13/11  . CARDIOVERSION N/A 07/02/2015   Procedure: CARDIOVERSION;  Surgeon: Sanda Klein, MD;  Location: [facility];  Service: Cardiovascular;  Laterality: N/A;  . CYSTOSCOPY WITH HOLMIUM LASER LITHOTRIPSY  1990's  . EP IMPLANTABLE DEVICE N/A 05/21/2015   MDT Advisa DR MRI pacemaker implanted by Dr Curt Bears  . KNEE ARTHROSCOPY Right ~ 1991  . LAPAROSCOPIC CHOLECYSTECTOMY  ~ 2008  . TRANSURETHRAL RESECTION OF BLADDER TUMOR WITH GYRUS (TURBT-GYRUS)   1990's?    ROS- all systems are reviewed and negative except as per HPI above  Current Outpatient Medications  Medication Sig Dispense Refill  . acyclovir (ZOVIRAX) 400 MG tablet Take 2 tablets (800 mg total) by mouth 5 (five) times daily. 50 tablet 0  . albuterol (PROVENTIL) (2.5 MG/3ML) 0.083% nebulizer solution Take 2.5 mg by nebulization every 6 (six) hours as needed for wheezing.    Marland Kitchen apixaban (ELIQUIS) 5 MG TABS tablet Take 1 tablet (5 mg total) by mouth 2 (two) times daily. 42 tablet 0  . carvedilol (COREG) 6.25 MG tablet Take 1 tablet (6.25 mg total) by mouth 2 (two) times daily with a meal. 180 tablet 2  . Cholecalciferol (VITAMIN D-3) 1000 UNITS CAPS Take 1,000 Units by mouth 2 (two) times daily.     . cholestyramine (QUESTRAN) 4 g packet Take 4 g by mouth 3 (three) times daily with meals.    . citalopram (CELEXA) 40 MG tablet Take 20-40 mg by mouth daily.     Marland Kitchen gabapentin (NEURONTIN) 400 MG capsule Take 400 mg by mouth 3 (three) times daily.     Marland Kitchen ibuprofen (ADVIL,MOTRIN) 200 MG tablet Take 400 mg by mouth every 6 (six) hours as needed for headache or moderate pain.    Marland Kitchen insulin aspart (NOVOLOG FLEXPEN) 100 UNIT/ML FlexPen Inject 22-28 Units into the skin 3 (three) times daily with meals. 10 pen 2  . insulin aspart (NOVOLOG FLEXPEN) 100 UNIT/ML  FlexPen Inject 22-28 Units into the skin 3 (three) times daily with meals. 30 mL 2  . Insulin Glargine (LANTUS SOLOSTAR) 100 UNIT/ML Solostar Pen Inject 80 Units into the skin daily at 10 pm. 10 pen 2  . Insulin Pen Needle (B-D ULTRAFINE III SHORT PEN) 31G X 8 MM MISC Use to inject insulin 4 x daily 200 each 3  . Insulin Pen Needle (B-D ULTRAFINE III SHORT PEN) 31G X 8 MM MISC 1 each by Does not apply route as directed. 150 each 3  . Insulin Pen Needle (PEN NEEDLES) 32G X 6 MM MISC 1 each by Does not apply route 4 (four) times daily.    Marland Kitchen ipratropium-albuterol (DUONEB) 0.5-2.5 (3) MG/3ML SOLN Take 3 mLs by nebulization every 4 (four) hours as  needed (for shortness of breath).    . metoCLOPramide (REGLAN) 5 MG tablet Take 5 mg by mouth every 6 (six) hours as needed for nausea or vomiting.     . nitroGLYCERIN (NITROSTAT) 0.4 MG SL tablet Place 1 tablet (0.4 mg total) under the tongue every 5 (five) minutes as needed for chest pain (up to 3 doses. If no relief after the 3 rd dose, proceed to the ED for an evaluation). May repeat for up to 3 doses. 25 tablet 3  . NON FORMULARY 1 each by Other route See admin instructions. CPAP - Uses nightly    . simvastatin (ZOCOR) 40 MG tablet TAKE ONE TABLET BY MOUTH AT BEDTIME 90 tablet 1  . vitamin B-12 (CYANOCOBALAMIN) 1000 MCG tablet Take 1,000 mcg by mouth 2 (two) times daily.      . VOLTAREN 1 % GEL Apply 2 g topically 4 (four) times daily as needed (for pain).      No current facility-administered medications for this visit.     Physical Exam: Vitals:   11/05/17 1052  BP: 130/70  Pulse: 72  SpO2: 97%  Weight: 239 lb (108.4 kg)  Height: 5\' 9"  (1.753 m)    GEN- The patient is well appearing, alert and oriented x 3 today.  Walks with a cane. Head- normocephalic, atraumatic Eyes-  Sclera clear, conjunctiva pink Ears- hearing intact Oropharynx- clear Lungs- Clear to ausculation bilaterally, normal work of breathing Chest- pacemaker pocket is well healed Heart- Regular rate and rhythm (paced) GI- soft, NT, ND, + BS Extremities- no clubbing, cyanosis, + dependant edema  Pacemaker interrogation- reviewed in detail today,  See PACEART report   Assessment and Plan:  1. Symptomatic complete heart block Normal pacemaker function See Pace Art report No changes today  2. Permanent afib Rate controlled and asymptomatic On anticoagulation with eliquis  3. obesity Body mass index is 35.29 kg/m. lifestyle modification encouraged  4. HTN Stable No change required today  Carelink Return in a year  Thompson Grayer MD, [facility] 11/05/2017 11:25 AM

## 2017-11-05 NOTE — Patient Instructions (Addendum)
Medication Instructions:   Your physician recommends that you continue on your current medications as directed. Please refer to the Current Medication list given to you today.  Labwork:  NONE  Testing/Procedures:  NONE  Follow-Up: Your physician recommends that you schedule a follow-up appointment in: 1 year with Dr. [name]. Please schedule this appointment today before leaving the office.   Any Other Special Instructions Will Be Listed Below (If Applicable).  Your next device check from home is on 02/03/18.  If you need a refill on your cardiac medications before your next appointment, please call your pharmacy.

## 2017-11-08 NOTE — Progress Notes (Signed)
Remote pacemaker transmission.   

## 2017-11-09 ENCOUNTER — Encounter: Payer: Self-pay | Admitting: Cardiology

## 2017-11-16 LAB — CUP PACEART REMOTE DEVICE CHECK
Battery Remaining Longevity: 56 mo
Battery Voltage: 2.99 V
Brady Statistic AP VP Percent: 0 %
Brady Statistic AS VS Percent: 9.45 %
Brady Statistic RV Percent Paced: 91.67 %
Date Time Interrogation Session: 20190502233136
Implantable Lead Implant Date: 20161115
Implantable Lead Implant Date: 20161115
Implantable Lead Location: 753859
Implantable Lead Location: 753860
Implantable Lead Model: 5076
Implantable Pulse Generator Implant Date: 20161115
Lead Channel Impedance Value: 361 Ohm
Lead Channel Impedance Value: 456 Ohm
Lead Channel Impedance Value: 494 Ohm
Lead Channel Pacing Threshold Amplitude: 0.5 V
Lead Channel Pacing Threshold Amplitude: 0.75 V
Lead Channel Pacing Threshold Pulse Width: 0.4 ms
Lead Channel Sensing Intrinsic Amplitude: 1 mV
Lead Channel Setting Pacing Pulse Width: 0.4 ms
Lead Channel Setting Sensing Sensitivity: 2.8 mV
MDC IDC MSMT LEADCHNL RA IMPEDANCE VALUE: 342 Ohm
MDC IDC MSMT LEADCHNL RA SENSING INTR AMPL: 1 mV
MDC IDC MSMT LEADCHNL RV PACING THRESHOLD PULSEWIDTH: 0.4 ms
MDC IDC MSMT LEADCHNL RV SENSING INTR AMPL: 7.375 mV
MDC IDC MSMT LEADCHNL RV SENSING INTR AMPL: 7.375 mV
MDC IDC SET LEADCHNL RV PACING AMPLITUDE: 2.5 V
MDC IDC STAT BRADY AP VS PERCENT: 0 %
MDC IDC STAT BRADY AS VP PERCENT: 90.55 %
MDC IDC STAT BRADY RA PERCENT PACED: 0 %

## 2017-11-18 ENCOUNTER — Other Ambulatory Visit: Payer: Self-pay | Admitting: Cardiology

## 2017-11-22 DIAGNOSIS — W57XXXA Bitten or stung by nonvenomous insect and other nonvenomous arthropods, initial encounter: Secondary | ICD-10-CM | POA: Diagnosis not present

## 2017-11-22 DIAGNOSIS — I10 Essential (primary) hypertension: Secondary | ICD-10-CM | POA: Diagnosis not present

## 2017-11-22 DIAGNOSIS — J449 Chronic obstructive pulmonary disease, unspecified: Secondary | ICD-10-CM | POA: Diagnosis not present

## 2017-11-22 DIAGNOSIS — Z299 Encounter for prophylactic measures, unspecified: Secondary | ICD-10-CM | POA: Diagnosis not present

## 2017-11-22 DIAGNOSIS — S90869A Insect bite (nonvenomous), unspecified foot, initial encounter: Secondary | ICD-10-CM | POA: Diagnosis not present

## 2017-11-22 DIAGNOSIS — M459 Ankylosing spondylitis of unspecified sites in spine: Secondary | ICD-10-CM | POA: Diagnosis not present

## 2017-11-22 DIAGNOSIS — Z6835 Body mass index (BMI) 35.0-35.9, adult: Secondary | ICD-10-CM | POA: Diagnosis not present

## 2017-11-22 DIAGNOSIS — I739 Peripheral vascular disease, unspecified: Secondary | ICD-10-CM | POA: Diagnosis not present

## 2017-11-22 DIAGNOSIS — I4891 Unspecified atrial fibrillation: Secondary | ICD-10-CM | POA: Diagnosis not present

## 2017-12-06 DIAGNOSIS — E1165 Type 2 diabetes mellitus with hyperglycemia: Secondary | ICD-10-CM | POA: Diagnosis not present

## 2017-12-06 DIAGNOSIS — Z6834 Body mass index (BMI) 34.0-34.9, adult: Secondary | ICD-10-CM | POA: Diagnosis not present

## 2017-12-06 DIAGNOSIS — E6609 Other obesity due to excess calories: Secondary | ICD-10-CM | POA: Diagnosis not present

## 2017-12-06 DIAGNOSIS — N183 Chronic kidney disease, stage 3 (moderate): Secondary | ICD-10-CM | POA: Diagnosis not present

## 2017-12-06 DIAGNOSIS — E1122 Type 2 diabetes mellitus with diabetic chronic kidney disease: Secondary | ICD-10-CM | POA: Diagnosis not present

## 2017-12-06 LAB — BASIC METABOLIC PANEL
BUN: 20 (ref 4–21)
Creatinine: 1.7 — AB (ref <=1.3)

## 2017-12-06 LAB — VITAMIN D 25 HYDROXY (VIT D DEFICIENCY, FRACTURES): VIT D 25 HYDROXY: 24.5

## 2017-12-06 LAB — LIPID PANEL
CHOLESTEROL: 111 (ref 0–200)
HDL: 33 — AB (ref 35–70)
Triglycerides: 423 — AB (ref 40–160)

## 2017-12-06 LAB — HEMOGLOBIN A1C: Hemoglobin A1C: 8.7

## 2017-12-09 ENCOUNTER — Encounter: Payer: Self-pay | Admitting: "Endocrinology

## 2017-12-09 ENCOUNTER — Ambulatory Visit (INDEPENDENT_AMBULATORY_CARE_PROVIDER_SITE_OTHER): Payer: PPO | Admitting: "Endocrinology

## 2017-12-09 VITALS — BP 132/74 | HR 78 | Ht 69.0 in | Wt 242.0 lb

## 2017-12-09 DIAGNOSIS — E1165 Type 2 diabetes mellitus with hyperglycemia: Secondary | ICD-10-CM | POA: Diagnosis not present

## 2017-12-09 DIAGNOSIS — Z6834 Body mass index (BMI) 34.0-34.9, adult: Secondary | ICD-10-CM | POA: Diagnosis not present

## 2017-12-09 DIAGNOSIS — E782 Mixed hyperlipidemia: Secondary | ICD-10-CM

## 2017-12-09 DIAGNOSIS — IMO0002 Reserved for concepts with insufficient information to code with codable children: Secondary | ICD-10-CM

## 2017-12-09 DIAGNOSIS — E6609 Other obesity due to excess calories: Secondary | ICD-10-CM

## 2017-12-09 DIAGNOSIS — E1122 Type 2 diabetes mellitus with diabetic chronic kidney disease: Secondary | ICD-10-CM

## 2017-12-09 DIAGNOSIS — N183 Chronic kidney disease, stage 3 (moderate): Secondary | ICD-10-CM

## 2017-12-09 DIAGNOSIS — I10 Essential (primary) hypertension: Secondary | ICD-10-CM | POA: Diagnosis not present

## 2017-12-09 MED ORDER — INSULIN ASPART 100 UNIT/ML FLEXPEN: 25.0000 [IU] | PEN_INJECTOR | Freq: Three times a day (TID) | SUBCUTANEOUS | 2 refills | Status: DC

## 2017-12-09 NOTE — Patient Instructions (Signed)

## 2017-12-09 NOTE — Progress Notes (Signed)
Subjective:    Patient ID: Dylan Tyler, male    DOB: 07-21-1945.  he is being seen in consultation for management of currently uncontrolled symptomatic diabetes requested by  Glenda Chroman, MD.   Past Medical History:  Diagnosis Date  . Ankylosing spondylitis ([place])   . Anxiety   . Aortic root dilatation (HCC)    Mild  . AR (aortic regurgitation)    Mild  . Atrial fibrillation and flutter ([place])   . Bladder cancer ([place])   . Chronic pain    Followed at [facility]  . DDD (degenerative disc disease), lumbosacral   . DDD (degenerative disc disease), thoracolumbar   . Degenerative cervical disc   . Depression   . Diabetes mellitus type II   . Diverticulitis   . Essential hypertension, benign   . GERD (gastroesophageal reflux disease)   . Hyperlipidemia   . Kidney stones   . Migraine   . Nonischemic cardiomyopathy ([place])    Cardiolite 12/09 LVEF 55%, minor luminal irregularities at cardiac catheterization  . OSA on CPAP   . Rheumatoid arthritis ([place])   . Symptomatic bradycardia    Medtronic Advisa L dual-chamber pacemaker  05/2015 - Dr. Curt Bears   Past Surgical History:  Procedure Laterality Date  . CARDIAC CATHETERIZATION  08/13/11  . CARDIOVERSION N/A 07/02/2015   Procedure: CARDIOVERSION;  Surgeon: Sanda Klein, MD;  Location: [facility];  Service: Cardiovascular;  Laterality: N/A;  . CYSTOSCOPY WITH HOLMIUM LASER LITHOTRIPSY  1990's  . EP IMPLANTABLE DEVICE N/A 05/21/2015   MDT Advisa DR MRI pacemaker implanted by Dr Curt Bears  . KNEE ARTHROSCOPY Right ~ 1991  . LAPAROSCOPIC CHOLECYSTECTOMY  ~ 2008  . TRANSURETHRAL RESECTION OF BLADDER TUMOR WITH GYRUS (TURBT-GYRUS)  1990's?   Social History   Socioeconomic History  . Marital status: Divorced    Spouse name: Not on file  . Number of children: Not on file  . Years of education: Not on file  . Highest education level: Not on file  Occupational History  . Occupation: Disabled    Employer: DISABLED  Social Needs  .  Financial resource strain: Not on file  . Food insecurity:    Worry: Not on file    Inability: Not on file  . Transportation needs:    Medical: Not on file    Non-medical: Not on file  Tobacco Use  . Smoking status: Current Every Day Smoker    Packs/day: 0.30    Years: 50.00    Pack years: 15.00    Types: E-cigarettes    Start date: 02/26/1959    Last attempt to quit: 12/06/2013    Years since quitting: 4.0  . Smokeless tobacco: Former Systems developer    Types: Chew  . Tobacco comment: 05/21/2015 "quit chewing in 1992"  Substance and Sexual Activity  . Alcohol use: Yes    Alcohol/week: 1.2 oz    Types: 2 Cans of beer per week  . Drug use: Yes    Types: Cocaine    Comment: 05/21/2015 "quit in the early 2000"  . Sexual activity: Not on file  Lifestyle  . Physical activity:    Days per week: Not on file    Minutes per session: Not on file  . Stress: Not on file  Relationships  . Social connections:    Talks on phone: Not on file    Gets together: Not on file    Attends religious service: Not on file    Active member of  club or organization: Not on file    Attends meetings of clubs or organizations: Not on file    Relationship status: Not on file  Other Topics Concern  . Not on file  Social History Narrative   Divorced   No regular exercise   Outpatient Encounter Medications as of 12/09/2017  Medication Sig  . acyclovir (ZOVIRAX) 400 MG tablet Take 2 tablets (800 mg total) by mouth 5 (five) times daily.  Marland Kitchen albuterol (PROVENTIL) (2.5 MG/3ML) 0.083% nebulizer solution Take 2.5 mg by nebulization every 6 (six) hours as needed for wheezing.  Marland Kitchen apixaban (ELIQUIS) 5 MG TABS tablet Take 1 tablet (5 mg total) by mouth 2 (two) times daily.  . carvedilol (COREG) 6.25 MG tablet Take 1 tablet (6.25 mg total) by mouth 2 (two) times daily with a meal.  . carvedilol (COREG) 6.25 MG tablet TAKE ONE TABLET BY MOUTH TWICE DAILY WITH A MEAL.  Marland Kitchen Cholecalciferol (VITAMIN D-3) 1000 UNITS CAPS Take 1,000  Units by mouth 2 (two) times daily.   . cholestyramine (QUESTRAN) 4 g packet Take 4 g by mouth 3 (three) times daily with meals.  . citalopram (CELEXA) 40 MG tablet Take 20-40 mg by mouth daily.   Marland Kitchen doxycycline (VIBRA-TABS) 100 MG tablet Take 100 mg by mouth 2 (two) times daily.  Marland Kitchen gabapentin (NEURONTIN) 400 MG capsule Take 400 mg by mouth 3 (three) times daily.   Marland Kitchen ibuprofen (ADVIL,MOTRIN) 200 MG tablet Take 400 mg by mouth every 6 (six) hours as needed for headache or moderate pain.  Marland Kitchen insulin aspart (NOVOLOG FLEXPEN) 100 UNIT/ML FlexPen Inject 25-31 Units into the skin 3 (three) times daily with meals.  . Insulin Glargine (LANTUS SOLOSTAR) 100 UNIT/ML Solostar Pen Inject 80 Units into the skin daily at 10 pm.  . Insulin Pen Needle (B-D ULTRAFINE III SHORT PEN) 31G X 8 MM MISC Use to inject insulin 4 x daily  . ipratropium-albuterol (DUONEB) 0.5-2.5 (3) MG/3ML SOLN Take 3 mLs by nebulization every 4 (four) hours as needed (for shortness of breath).  . metoCLOPramide (REGLAN) 5 MG tablet Take 5 mg by mouth every 6 (six) hours as needed for nausea or vomiting.   . nitroGLYCERIN (NITROSTAT) 0.4 MG SL tablet Place 1 tablet (0.4 mg total) under the tongue every 5 (five) minutes as needed for chest pain (up to 3 doses. If no relief after the 3 rd dose, proceed to the ED for an evaluation). May repeat for up to 3 doses.  . NON FORMULARY 1 each by Other route See admin instructions. CPAP - Uses nightly  . simvastatin (ZOCOR) 40 MG tablet TAKE ONE TABLET BY MOUTH AT BEDTIME  . vitamin B-12 (CYANOCOBALAMIN) 1000 MCG tablet Take 1,000 mcg by mouth 2 (two) times daily.    . VOLTAREN 1 % GEL Apply 2 g topically 4 (four) times daily as needed (for pain).   . [DISCONTINUED] insulin aspart (NOVOLOG FLEXPEN) 100 UNIT/ML FlexPen Inject 22-28 Units into the skin 3 (three) times daily with meals.  . [DISCONTINUED] insulin aspart (NOVOLOG FLEXPEN) 100 UNIT/ML FlexPen Inject 22-28 Units into the skin 3 (three) times  daily with meals.  . [DISCONTINUED] Insulin Pen Needle (B-D ULTRAFINE III SHORT PEN) 31G X 8 MM MISC 1 each by Does not apply route as directed.  . [DISCONTINUED] Insulin Pen Needle (PEN NEEDLES) 32G X 6 MM MISC 1 each by Does not apply route 4 (four) times daily.   No facility-administered encounter medications on file as of 12/09/2017.  ALLERGIES: No Known Allergies  VACCINATION STATUS:  There is no immunization history on file for this patient.  Diabetes  He presents for his follow-up diabetic visit. He has type 2 diabetes mellitus. Onset time: He was diagnosed at approximate age of 71 years. His disease course has been improving (His most recent A1c was 9.8% on 01/12/2017.). There are no hypoglycemic associated symptoms. Pertinent negatives for hypoglycemia include no confusion, headaches, pallor or seizures. Associated symptoms include foot paresthesias, polydipsia and polyuria. Pertinent negatives for diabetes include no chest pain, no fatigue, no polyphagia and no weakness. There are no hypoglycemic complications. Symptoms are improving. Diabetic complications include peripheral neuropathy. Risk factors for coronary artery disease include dyslipidemia, diabetes mellitus, hypertension, male sex, sedentary lifestyle, tobacco exposure and family history. Current diabetic treatment includes oral agent (dual therapy). His weight is fluctuating minimally. He is following a generally unhealthy diet. When asked about meal planning, he reported none. He has not had a previous visit with a dietitian. He never (He is wheelchair-bound due to deconditioning, and due todegenerative joint disease.) participates in exercise. His breakfast blood glucose range is generally >200 mg/dl. His lunch blood glucose range is generally 180-200 mg/dl. His dinner blood glucose range is generally 180-200 mg/dl. His bedtime blood glucose range is generally 180-200 mg/dl. His overall blood glucose range is >200 mg/dl. An ACE  inhibitor/angiotensin II receptor blocker is being taken. He sees a podiatrist. Hyperlipidemia  This is a chronic problem. The current episode started more than 1 year ago. Exacerbating diseases include diabetes and obesity. Factors aggravating his hyperlipidemia include smoking. Associated symptoms include shortness of breath. Pertinent negatives include no chest pain or myalgias. Current antihyperlipidemic treatment includes statins. Risk factors for coronary artery disease include dyslipidemia, diabetes mellitus, hypertension, a sedentary lifestyle, male sex and obesity.  Hypertension  This is a chronic problem. The current episode started more than 1 year ago. The problem is controlled. Associated symptoms include shortness of breath. Pertinent negatives include no chest pain, headaches, neck pain or palpitations. Risk factors for coronary artery disease include diabetes mellitus, dyslipidemia, male gender, obesity, sedentary lifestyle and smoking/tobacco exposure. Past treatments include ACE inhibitors.    Review of Systems  Constitutional: Negative for chills, fatigue, fever and unexpected weight change.  HENT: Negative for dental problem, mouth sores and trouble swallowing.   Eyes: Negative for visual disturbance.  Respiratory: Positive for cough and shortness of breath. Negative for choking, chest tightness and wheezing.   Cardiovascular: Negative for chest pain, palpitations and leg swelling.  Gastrointestinal: Negative for abdominal distention, abdominal pain, constipation, diarrhea, nausea and vomiting.  Endocrine: Positive for polydipsia and polyuria. Negative for polyphagia.  Genitourinary: Negative for dysuria, flank pain, hematuria and urgency.  Musculoskeletal: Positive for gait problem. Negative for back pain, myalgias and neck pain.       Walks with a cane.  Skin: Negative for pallor, rash and wound.  Neurological: Negative for seizures, syncope, weakness, numbness and headaches.   Psychiatric/Behavioral: Negative for confusion and dysphoric mood.    Objective:    BP 132/74   Pulse 78   Ht 5\' 9"  (1.753 m)   Wt 242 lb (109.8 kg)   BMI 35.74 kg/m   Wt Readings from Last 3 Encounters:  12/09/17 242 lb (109.8 kg)  11/05/17 239 lb (108.4 kg)  10/29/17 244 lb (110.7 kg)     Physical Exam  Constitutional: He is oriented to person, place, and time. He appears well-developed. He is cooperative. No distress.  Dylan Tyler walks with a cane.  HENT:  Head: Normocephalic and atraumatic.  Eyes: EOM are normal.  Neck: Normal range of motion. Neck supple. No tracheal deviation present. No thyromegaly present.  Cardiovascular: Normal rate, S1 normal and S2 normal. Exam reveals no gallop.  No murmur heard. Pulses:      Dorsalis pedis pulses are 1+ on the right side, and 1+ on the left side.       Posterior tibial pulses are 1+ on the right side, and 1+ on the left side.  Pulmonary/Chest: Effort normal. No respiratory distress. He has no wheezes.  Abdominal: He exhibits no distension. There is no tenderness. There is no guarding and no CVA tenderness.  Musculoskeletal: He exhibits no edema.       Right shoulder: He exhibits no swelling and no deformity.  Uses walker to get around.  Neurological: He is alert and oriented to person, place, and time. He has normal strength. No cranial nerve deficit or sensory deficit. Gait normal.  Skin: Skin is warm and dry. No rash noted. No cyanosis. Nails show no clubbing.  Psychiatric: He has a normal mood and affect. His speech is normal. Cognition and memory are normal.    CMP     Component Value Date/Time   NA 138 10/20/2017 1608   K 4.9 10/20/2017 1608   CL 107 10/20/2017 1608   CO2 20 (L) 10/20/2017 1600   GLUCOSE 211 (H) 10/20/2017 1608   BUN 20 12/06/2017   CREATININE 1.7 (A) 12/06/2017   CREATININE 1.50 (H) 10/20/2017 1608   CREATININE 1.29 (H) 04/12/2015 1609   CALCIUM 9.9 10/20/2017 1600   PROT 7.4 10/20/2017 1600    ALBUMIN 3.9 10/20/2017 1600   AST 24 10/20/2017 1600   ALT 24 10/20/2017 1600   ALKPHOS 69 10/20/2017 1600   BILITOT 0.8 10/20/2017 1600   GFRNONAA 43 (L) 10/20/2017 1600   GFRAA 49 (L) 10/20/2017 1600    Lipid Panel     Component Value Date/Time   CHOL 111 12/06/2017   TRIG 423 (A) 12/06/2017   HDL 33 (A) 12/06/2017   Results for NICOLUS, OSE (MRN [id number]) as of 12/09/2017 16:57  Ref. Range 04/16/2017 00:00 08/20/2017 00:00 10/20/2017 16:00 10/20/2017 16:08 12/06/2017 00:00  Glucose Latest Ref Range: 65 - 99 mg/dL   211 (H) 211 (H)   Hemoglobin A1C Unknown 10.1 9.7   8.7    Assessment & Plan:   1. Type 2 diabetes mellitus with stage 3 chronic kidney disease, without long-term current use of insulin ([place])  - Patient has currently uncontrolled symptomatic type 2 DM since  72 years of age. -Patient came with slightly better blood glucose profile and A1c of 8.7% improving from 10.1%.     -his diabetes is complicated by stage 3 renal insufficiency, obesity/sedentary life, neuropathy, cardiomyopathy, chronic heavy smoking, inadequate insurance and VANDY TSUCHIYA remains at a high risk for more acute and chronic complications which include CAD, CVA, CKD, retinopathy, and neuropathy. These are all discussed in detail with the patient.  - I have counseled him on diet management and weight loss, by adopting a carbohydrate restricted/protein rich diet.  -  Suggestion is made for him to avoid simple carbohydrates  from his diet including Cakes, Sweet Desserts / Pastries, Ice Cream, Soda (diet and regular), Sweet Tea, Candies, Chips, Cookies, Store Bought Juices, Alcohol in Excess of  1-2 drinks a day, Artificial Sweeteners, and "Sugar-free" Products. This will help patient to have stable blood glucose  profile and potentially avoid unintended weight gain.   - I encouraged him to switch to  unprocessed or minimally processed complex starch and increased protein intake (animal or plant  source), fruits, and vegetables.  - he is advised to stick to a routine mealtimes to eat 3 meals  a day and avoid unnecessary snacks ( to snack only to correct hypoglycemia).   - I have approached him with the following individualized plan to manage diabetes and patient agrees:   - Based on his glucose profile, he will  need even higher dose of insulin in the basal /bolus  fashion for him to achieve control of diabetes to target.   -   I advised him to continue Lantus 80 units nightly, increase NovoLog to 25  units 3 times a day before meals for pre-meal blood glucose readings above 90 mg/dL (with additional correction for readings above 150 mg/dL)  associated with strict monitoring of blood glucose 4 times a day- before meals and at bedtime. - He is advised to stay committed for proper monitoring for safe use of insulin.  -Patient is encouraged to call clinic for blood glucose levels less than 70 or above 300 mg /dl. - He is not a candidate for metformin therapy due to CKD.  - He  will be considered for incretin therapy when he gets appropriate insurance coverage.  - Patient specific target  A1c;  LDL, HDL, Triglycerides, and  Waist Circumference were discussed in detail.  2) BP/HTN: His blood pressure is controlled to target.   I advised him to continue current blood pressure medications.  3) Lipids/HPL: He has significant hypertriglyceridemia.  He is advised to continue simvastatin 40 mg p.o. nightly, and cholestyramine 4 g 3 times a day.   4)  Weight/Diet: CDE Consult has been initiated , exercise, and detailed carbohydrates information provided.  5) Chronic Care/Health Maintenance:  -he  is on ACEI/ARB and Statin medications and  is encouraged to continue to follow up with Ophthalmology, Dentist,  Podiatrist at least yearly or according to recommendations, and advised to  stay away from smoking. I have recommended yearly flu vaccine and pneumonia vaccination at least every 5 years; and   sleep for at least 7 hours a day.  - I advised patient to maintain close follow up with Glenda Chroman, MD for primary care needs.  - Time spent with the patient: 25 min, of which >50% was spent in reviewing his blood glucose logs , discussing his hypo- and hyper-glycemic episodes, reviewing his current and  previous labs and insulin doses and developing a plan to avoid hypo- and hyper-glycemia. Please refer to Patient Instructions for Blood Glucose Monitoring and Insulin/Medications Dosing Guide"  in media tab for additional information. [name] participated in the discussions, expressed understanding, and voiced agreement with the above plans.  All questions were answered to his satisfaction. he is encouraged to contact clinic should he have any questions or concerns prior to his return visit.  Follow up plan: - Return in about 3 months (around 03/11/2018) for meter, and logs.  Glade Lloyd, MD Phone: [phone]  Fax: [phone]   12/09/2017, 4:53 PM This note was partially dictated with voice recognition software. Similar sounding words can be transcribed inadequately or may not  be corrected upon review.

## 2017-12-13 DIAGNOSIS — I4891 Unspecified atrial fibrillation: Secondary | ICD-10-CM | POA: Diagnosis not present

## 2017-12-13 DIAGNOSIS — J069 Acute upper respiratory infection, unspecified: Secondary | ICD-10-CM | POA: Diagnosis not present

## 2017-12-13 DIAGNOSIS — Z299 Encounter for prophylactic measures, unspecified: Secondary | ICD-10-CM | POA: Diagnosis not present

## 2017-12-13 DIAGNOSIS — B356 Tinea cruris: Secondary | ICD-10-CM | POA: Diagnosis not present

## 2017-12-13 DIAGNOSIS — I10 Essential (primary) hypertension: Secondary | ICD-10-CM | POA: Diagnosis not present

## 2017-12-13 DIAGNOSIS — Z6836 Body mass index (BMI) 36.0-36.9, adult: Secondary | ICD-10-CM | POA: Diagnosis not present

## 2017-12-16 ENCOUNTER — Other Ambulatory Visit: Payer: Self-pay | Admitting: "Endocrinology

## 2017-12-30 ENCOUNTER — Ambulatory Visit: Payer: PPO | Admitting: "Endocrinology

## 2018-01-13 ENCOUNTER — Other Ambulatory Visit: Payer: Self-pay | Admitting: "Endocrinology

## 2018-01-26 DIAGNOSIS — Z1331 Encounter for screening for depression: Secondary | ICD-10-CM | POA: Diagnosis not present

## 2018-01-26 DIAGNOSIS — Z0000 Encounter for general adult medical examination without abnormal findings: Secondary | ICD-10-CM | POA: Diagnosis not present

## 2018-01-26 DIAGNOSIS — Z299 Encounter for prophylactic measures, unspecified: Secondary | ICD-10-CM | POA: Diagnosis not present

## 2018-01-26 DIAGNOSIS — Z1339 Encounter for screening examination for other mental health and behavioral disorders: Secondary | ICD-10-CM | POA: Diagnosis not present

## 2018-01-26 DIAGNOSIS — I10 Essential (primary) hypertension: Secondary | ICD-10-CM | POA: Diagnosis not present

## 2018-01-26 DIAGNOSIS — Z6836 Body mass index (BMI) 36.0-36.9, adult: Secondary | ICD-10-CM | POA: Diagnosis not present

## 2018-01-26 DIAGNOSIS — E785 Hyperlipidemia, unspecified: Secondary | ICD-10-CM | POA: Diagnosis not present

## 2018-01-26 DIAGNOSIS — Z1211 Encounter for screening for malignant neoplasm of colon: Secondary | ICD-10-CM | POA: Diagnosis not present

## 2018-01-26 DIAGNOSIS — R5383 Other fatigue: Secondary | ICD-10-CM | POA: Diagnosis not present

## 2018-01-26 DIAGNOSIS — M456 Ankylosing spondylitis lumbar region: Secondary | ICD-10-CM | POA: Diagnosis not present

## 2018-01-26 DIAGNOSIS — Z7189 Other specified counseling: Secondary | ICD-10-CM | POA: Diagnosis not present

## 2018-01-27 ENCOUNTER — Other Ambulatory Visit: Payer: Self-pay | Admitting: Cardiology

## 2018-01-27 ENCOUNTER — Other Ambulatory Visit: Payer: Self-pay | Admitting: "Endocrinology

## 2018-01-27 DIAGNOSIS — Z125 Encounter for screening for malignant neoplasm of prostate: Secondary | ICD-10-CM | POA: Diagnosis not present

## 2018-01-27 DIAGNOSIS — R5383 Other fatigue: Secondary | ICD-10-CM | POA: Diagnosis not present

## 2018-01-27 DIAGNOSIS — E785 Hyperlipidemia, unspecified: Secondary | ICD-10-CM | POA: Diagnosis not present

## 2018-01-27 DIAGNOSIS — Z79899 Other long term (current) drug therapy: Secondary | ICD-10-CM | POA: Diagnosis not present

## 2018-02-03 ENCOUNTER — Telehealth: Payer: Self-pay

## 2018-02-03 ENCOUNTER — Ambulatory Visit (INDEPENDENT_AMBULATORY_CARE_PROVIDER_SITE_OTHER): Payer: PPO | Admitting: *Deleted

## 2018-02-03 DIAGNOSIS — R001 Bradycardia, unspecified: Secondary | ICD-10-CM

## 2018-02-03 NOTE — Telephone Encounter (Signed)
Spoke with pt and reminded pt of remote transmission that is due today. Pt verbalized understanding.   

## 2018-02-04 NOTE — Progress Notes (Signed)
Remote pacemaker transmission.   

## 2018-03-03 LAB — CUP PACEART REMOTE DEVICE CHECK
Brady Statistic AP VP Percent: 0 %
Brady Statistic AP VS Percent: 0 %
Brady Statistic AS VP Percent: 90.46 %
Brady Statistic AS VS Percent: 9.54 %
Brady Statistic RV Percent Paced: 91.91 %
Date Time Interrogation Session: 20190802010907
Implantable Lead Implant Date: 20161115
Implantable Lead Location: 753860
Implantable Lead Model: 5076
Implantable Lead Model: 5076
Lead Channel Impedance Value: 437 Ohm
Lead Channel Impedance Value: 494 Ohm
Lead Channel Pacing Threshold Pulse Width: 0.4 ms
Lead Channel Sensing Intrinsic Amplitude: 1.375 mV
Lead Channel Sensing Intrinsic Amplitude: 1.375 mV
Lead Channel Sensing Intrinsic Amplitude: 6.125 mV
Lead Channel Sensing Intrinsic Amplitude: 6.125 mV
Lead Channel Setting Pacing Amplitude: 2.5 V
MDC IDC LEAD IMPLANT DT: 20161115
MDC IDC LEAD LOCATION: 753859
MDC IDC MSMT BATTERY REMAINING LONGEVITY: 54 mo
MDC IDC MSMT BATTERY VOLTAGE: 2.99 V
MDC IDC MSMT LEADCHNL RA IMPEDANCE VALUE: 361 Ohm
MDC IDC MSMT LEADCHNL RA PACING THRESHOLD AMPLITUDE: 0.5 V
MDC IDC MSMT LEADCHNL RV IMPEDANCE VALUE: 342 Ohm
MDC IDC MSMT LEADCHNL RV PACING THRESHOLD AMPLITUDE: 0.875 V
MDC IDC MSMT LEADCHNL RV PACING THRESHOLD PULSEWIDTH: 0.4 ms
MDC IDC PG IMPLANT DT: 20161115
MDC IDC SET LEADCHNL RV PACING PULSEWIDTH: 0.4 ms
MDC IDC SET LEADCHNL RV SENSING SENSITIVITY: 2.8 mV
MDC IDC STAT BRADY RA PERCENT PACED: 0 %

## 2018-03-11 DIAGNOSIS — N183 Chronic kidney disease, stage 3 (moderate): Secondary | ICD-10-CM | POA: Diagnosis not present

## 2018-03-11 DIAGNOSIS — E1122 Type 2 diabetes mellitus with diabetic chronic kidney disease: Secondary | ICD-10-CM | POA: Diagnosis not present

## 2018-03-11 DIAGNOSIS — E1165 Type 2 diabetes mellitus with hyperglycemia: Secondary | ICD-10-CM | POA: Diagnosis not present

## 2018-03-11 LAB — HEMOGLOBIN A1C: Hemoglobin A1C: 8.2

## 2018-03-11 LAB — BASIC METABOLIC PANEL
BUN: 16 (ref 4–21)
Creatinine: 1.5 — AB (ref 0.6–1.3)

## 2018-03-14 ENCOUNTER — Encounter: Payer: Self-pay | Admitting: "Endocrinology

## 2018-03-14 ENCOUNTER — Ambulatory Visit (INDEPENDENT_AMBULATORY_CARE_PROVIDER_SITE_OTHER): Payer: PPO | Admitting: "Endocrinology

## 2018-03-14 VITALS — BP 133/70 | HR 71 | Ht 69.0 in | Wt 242.0 lb

## 2018-03-14 DIAGNOSIS — F17200 Nicotine dependence, unspecified, uncomplicated: Secondary | ICD-10-CM | POA: Diagnosis not present

## 2018-03-14 DIAGNOSIS — E782 Mixed hyperlipidemia: Secondary | ICD-10-CM | POA: Diagnosis not present

## 2018-03-14 DIAGNOSIS — I10 Essential (primary) hypertension: Secondary | ICD-10-CM

## 2018-03-14 DIAGNOSIS — E1122 Type 2 diabetes mellitus with diabetic chronic kidney disease: Secondary | ICD-10-CM

## 2018-03-14 DIAGNOSIS — N183 Chronic kidney disease, stage 3 (moderate): Secondary | ICD-10-CM

## 2018-03-14 DIAGNOSIS — Z6834 Body mass index (BMI) 34.0-34.9, adult: Secondary | ICD-10-CM | POA: Diagnosis not present

## 2018-03-14 DIAGNOSIS — E1165 Type 2 diabetes mellitus with hyperglycemia: Secondary | ICD-10-CM

## 2018-03-14 DIAGNOSIS — E6609 Other obesity due to excess calories: Secondary | ICD-10-CM

## 2018-03-14 DIAGNOSIS — IMO0002 Reserved for concepts with insufficient information to code with codable children: Secondary | ICD-10-CM

## 2018-03-14 NOTE — Patient Instructions (Signed)

## 2018-03-14 NOTE — Progress Notes (Signed)
Endocrinology follow-up note   Subjective:    Patient ID: Dylan Tyler, male    DOB: Oct 15, 1945.  he is being seen in consultation for management of currently uncontrolled symptomatic diabetes type 2 diabetes, hypertension, hyperlipidemia. PMD:  Glenda Chroman, MD.   Past Medical History:  Diagnosis Date  . Ankylosing spondylitis ([place])   . Anxiety   . Aortic root dilatation ([facility])    Mild  . AR (aortic regurgitation)    Mild  . Atrial fibrillation and flutter ([place])   . Bladder cancer ([place])   . Chronic pain    Followed at [facility]  . DDD (degenerative disc disease), lumbosacral   . DDD (degenerative disc disease), thoracolumbar   . Degenerative cervical disc   . Depression   . Diabetes mellitus type II   . Diverticulitis   . Essential hypertension, benign   . GERD (gastroesophageal reflux disease)   . Hyperlipidemia   . Kidney stones   . Migraine   . Nonischemic cardiomyopathy ([place])    Cardiolite 12/09 LVEF 55%, minor luminal irregularities at cardiac catheterization  . OSA on CPAP   . Rheumatoid arthritis ([place])   . Symptomatic bradycardia    Medtronic Advisa L dual-chamber pacemaker  05/2015 - Dr. Curt Bears   Past Surgical History:  Procedure Laterality Date  . CARDIAC CATHETERIZATION  08/13/11  . CARDIOVERSION N/A 07/02/2015   Procedure: CARDIOVERSION;  Surgeon: Sanda Klein, MD;  Location: [facility] ENDOSCOPY;  Service: Cardiovascular;  Laterality: N/A;  . CYSTOSCOPY WITH HOLMIUM LASER LITHOTRIPSY  1990's  . EP IMPLANTABLE DEVICE N/A 05/21/2015   MDT Advisa DR MRI pacemaker implanted by Dr Curt Bears  . KNEE ARTHROSCOPY Right ~ 1991  . LAPAROSCOPIC CHOLECYSTECTOMY  ~ 2008  . TRANSURETHRAL RESECTION OF BLADDER TUMOR WITH GYRUS (TURBT-GYRUS)  1990's?   Social History   Socioeconomic History  . Marital status: Divorced    Spouse name: Not on file  . Number of children: Not on file  . Years of education: Not on file  . Highest education level: Not on file  Occupational  History  . Occupation: Disabled    Employer: DISABLED  Social Needs  . Financial resource strain: Not on file  . Food insecurity:    Worry: Not on file    Inability: Not on file  . Transportation needs:    Medical: Not on file    Non-medical: Not on file  Tobacco Use  . Smoking status: Current Every Day Smoker    Packs/day: 0.30    Years: 50.00    Pack years: 15.00    Types: E-cigarettes    Start date: 02/26/1959    Last attempt to quit: 12/06/2013    Years since quitting: 4.2  . Smokeless tobacco: Former Systems developer    Types: Chew  . Tobacco comment: 05/21/2015 "quit chewing in 1992"  Substance and Sexual Activity  . Alcohol use: Yes    Alcohol/week: 2.0 standard drinks    Types: 2 Cans of beer per week  . Drug use: Yes    Types: Cocaine    Comment: 05/21/2015 "quit in the early 2000"  . Sexual activity: Not on file  Lifestyle  . Physical activity:    Days per week: Not on file    Minutes per session: Not on file  . Stress: Not on file  Relationships  . Social connections:    Talks on phone: Not on file    Gets together: Not on file    Attends religious service:  Not on file    Active member of club or organization: Not on file    Attends meetings of clubs or organizations: Not on file    Relationship status: Not on file  Other Topics Concern  . Not on file  Social History Narrative   Divorced   No regular exercise   Outpatient Encounter Medications as of 03/14/2018  Medication Sig  . acyclovir (ZOVIRAX) 400 MG tablet Take 2 tablets (800 mg total) by mouth 5 (five) times daily.  Marland Kitchen albuterol (PROVENTIL) (2.5 MG/3ML) 0.083% nebulizer solution Take 2.5 mg by nebulization every 6 (six) hours as needed for wheezing.  Marland Kitchen apixaban (ELIQUIS) 5 MG TABS tablet Take 1 tablet (5 mg total) by mouth 2 (two) times daily.  . BD PEN NEEDLE NANO U/F 32G X 4 MM MISC USE ONE AS DIRECTED  . carvedilol (COREG) 6.25 MG tablet Take 1 tablet (6.25 mg total) by mouth 2 (two) times daily with a meal.   . Cholecalciferol (VITAMIN D-3) 1000 UNITS CAPS Take 1,000 Units by mouth 2 (two) times daily.   . cholestyramine (QUESTRAN) 4 g packet Take 4 g by mouth 3 (three) times daily with meals.  . citalopram (CELEXA) 40 MG tablet Take 20-40 mg by mouth daily.   Marland Kitchen doxycycline (VIBRA-TABS) 100 MG tablet Take 100 mg by mouth 2 (two) times daily.  Marland Kitchen gabapentin (NEURONTIN) 400 MG capsule Take 400 mg by mouth 3 (three) times daily.   Marland Kitchen ibuprofen (ADVIL,MOTRIN) 200 MG tablet Take 400 mg by mouth every 6 (six) hours as needed for headache or moderate pain.  Marland Kitchen insulin aspart (NOVOLOG FLEXPEN) 100 UNIT/ML FlexPen Inject 25-31 Units into the skin 3 (three) times daily with meals.  . Insulin Pen Needle (B-D ULTRAFINE III SHORT PEN) 31G X 8 MM MISC Use to inject insulin 4 x daily  . ipratropium-albuterol (DUONEB) 0.5-2.5 (3) MG/3ML SOLN Take 3 mLs by nebulization every 4 (four) hours as needed (for shortness of breath).  Marland Kitchen LANTUS SOLOSTAR 100 UNIT/ML Solostar Pen INJECT 80 UNITS INTO SKIN DAILY AT 10PM.  . metoCLOPramide (REGLAN) 5 MG tablet Take 5 mg by mouth every 6 (six) hours as needed for nausea or vomiting.   . nitroGLYCERIN (NITROSTAT) 0.4 MG SL tablet Place 1 tablet (0.4 mg total) under the tongue every 5 (five) minutes as needed for chest pain (up to 3 doses. If no relief after the 3 rd dose, proceed to the ED for an evaluation). May repeat for up to 3 doses.  . NON FORMULARY 1 each by Other route See admin instructions. CPAP - Uses nightly  . simvastatin (ZOCOR) 40 MG tablet TAKE ONE TABLET BY MOUTH AT BEDTIME  . vitamin B-12 (CYANOCOBALAMIN) 1000 MCG tablet Take 1,000 mcg by mouth 2 (two) times daily.    . VOLTAREN 1 % GEL Apply 2 g topically 4 (four) times daily as needed (for pain).   . [DISCONTINUED] insulin aspart (NOVOLOG FLEXPEN) 100 UNIT/ML FlexPen Inject 25-31 Units into the skin 3 (three) times daily with meals.   No facility-administered encounter medications on file as of 03/14/2018.      ALLERGIES: No Known Allergies  VACCINATION STATUS:  There is no immunization history on file for this patient.  Diabetes  He presents for his follow-up diabetic visit. He has type 2 diabetes mellitus. Onset time: He was diagnosed at approximate age of 27 years. His disease course has been improving (His most recent A1c was 9.8% on 01/12/2017.). There are no hypoglycemic associated  symptoms. Pertinent negatives for hypoglycemia include no confusion, headaches, pallor or seizures. Associated symptoms include foot paresthesias, polydipsia and polyuria. Pertinent negatives for diabetes include no chest pain, no fatigue, no polyphagia and no weakness. There are no hypoglycemic complications. Symptoms are improving. Diabetic complications include peripheral neuropathy. Risk factors for coronary artery disease include dyslipidemia, diabetes mellitus, hypertension, male sex, sedentary lifestyle, tobacco exposure and family history. Current diabetic treatment includes oral agent (dual therapy). His weight is fluctuating minimally. He is following a generally unhealthy diet. When asked about meal planning, he reported none. He has not had a previous visit with a dietitian. He never (He is wheelchair-bound due to deconditioning, and due todegenerative joint disease.) participates in exercise. His breakfast blood glucose range is generally >200 mg/dl. His lunch blood glucose range is generally 180-200 mg/dl. His dinner blood glucose range is generally 180-200 mg/dl. His bedtime blood glucose range is generally 180-200 mg/dl. His overall blood glucose range is >200 mg/dl. An ACE inhibitor/angiotensin II receptor blocker is being taken. He sees a podiatrist. Hyperlipidemia  This is a chronic problem. The current episode started more than 1 year ago. Exacerbating diseases include diabetes and obesity. Factors aggravating his hyperlipidemia include smoking. Associated symptoms include shortness of breath. Pertinent  negatives include no chest pain or myalgias. Current antihyperlipidemic treatment includes statins. Risk factors for coronary artery disease include dyslipidemia, diabetes mellitus, hypertension, a sedentary lifestyle, male sex and obesity.  Hypertension  This is a chronic problem. The current episode started more than 1 year ago. The problem is controlled. Associated symptoms include shortness of breath. Pertinent negatives include no chest pain, headaches, neck pain or palpitations. Risk factors for coronary artery disease include diabetes mellitus, dyslipidemia, male gender, obesity, sedentary lifestyle and smoking/tobacco exposure. Past treatments include ACE inhibitors.    Review of Systems  Constitutional: Negative for chills, fatigue, fever and unexpected weight change.  HENT: Negative for dental problem, mouth sores and trouble swallowing.   Eyes: Negative for visual disturbance.  Respiratory: Positive for cough and shortness of breath. Negative for choking, chest tightness and wheezing.   Cardiovascular: Negative for chest pain, palpitations and leg swelling.  Gastrointestinal: Negative for abdominal distention, abdominal pain, constipation, diarrhea, nausea and vomiting.  Endocrine: Positive for polydipsia and polyuria. Negative for polyphagia.  Genitourinary: Negative for dysuria, flank pain, hematuria and urgency.  Musculoskeletal: Positive for gait problem. Negative for back pain, myalgias and neck pain.       Walks with a cane.  Skin: Negative for pallor, rash and wound.  Neurological: Negative for seizures, syncope, weakness, numbness and headaches.  Psychiatric/Behavioral: Negative for confusion and dysphoric mood.    Objective:    BP 133/70   Pulse 71   Ht 5\' 9"  (1.753 m)   Wt 242 lb (109.8 kg)   BMI 35.74 kg/m   Wt Readings from Last 3 Encounters:  03/14/18 242 lb (109.8 kg)  12/09/17 242 lb (109.8 kg)  11/05/17 239 lb (108.4 kg)     Physical Exam   Constitutional: He is oriented to person, place, and time. He appears well-developed. He is cooperative. No distress.  Dylan Tyler walks with a cane.  HENT:  Head: Normocephalic and atraumatic.  Eyes: EOM are normal.  Neck: Normal range of motion. Neck supple. No tracheal deviation present. No thyromegaly present.  Cardiovascular: Normal rate, S1 normal and S2 normal. Exam reveals no gallop.  No murmur heard. Pulses:      Dorsalis pedis pulses are 1+ on the right side,  and 1+ on the left side.       Posterior tibial pulses are 1+ on the right side, and 1+ on the left side.  Pulmonary/Chest: Effort normal. No respiratory distress. He has no wheezes.  Abdominal: He exhibits no distension. There is no tenderness. There is no guarding and no CVA tenderness.  Musculoskeletal: He exhibits no edema.       Right shoulder: He exhibits no swelling and no deformity.  Uses walker to get around.  Neurological: He is alert and oriented to person, place, and time. He has normal strength. No cranial nerve deficit or sensory deficit. Gait normal.  Skin: Skin is warm and dry. No rash noted. No cyanosis. Nails show no clubbing.  Psychiatric: He has a normal mood and affect. His speech is normal. Cognition and memory are normal.    CMP     Component Value Date/Time   NA 138 10/20/2017 1608   K 4.9 10/20/2017 1608   CL 107 10/20/2017 1608   CO2 20 (L) 10/20/2017 1600   GLUCOSE 211 (H) 10/20/2017 1608   BUN 16 03/11/2018   CREATININE 1.5 (A) 03/11/2018   CREATININE 1.50 (H) 10/20/2017 1608   CREATININE 1.29 (H) 04/12/2015 1609   CALCIUM 9.9 10/20/2017 1600   PROT 7.4 10/20/2017 1600   ALBUMIN 3.9 10/20/2017 1600   AST 24 10/20/2017 1600   ALT 24 10/20/2017 1600   ALKPHOS 69 10/20/2017 1600   BILITOT 0.8 10/20/2017 1600   GFRNONAA 43 (L) 10/20/2017 1600   GFRAA 49 (L) 10/20/2017 1600    Lipid Panel     Component Value Date/Time   CHOL 111 12/06/2017   TRIG 423 (A) 12/06/2017   HDL 33 (A)  12/06/2017    A1c on 10 x 6 2019 was 8.2%.   Assessment & Plan:   1. Type 2 diabetes mellitus with stage 3 chronic kidney disease, without long-term current use of insulin ([place])  - Patient has currently uncontrolled symptomatic type 2 DM since  72 years of age. -Patient came with continued improvement with A1c of 8.2%, generally improving from 10.1%.   -He has missed a third of his Lantus injections due to the fact that he falls asleep in the evening hours.  Most of those omissions led to above target fasting blood glucose readings.    -his diabetes is complicated by stage 3 renal insufficiency, obesity/sedentary life, neuropathy, cardiomyopathy, chronic heavy smoking, inadequate insurance and Dylan Tyler remains at a high risk for more acute and chronic complications which include CAD, CVA, CKD, retinopathy, and neuropathy. These are all discussed in detail with the patient.  - I have counseled him on diet management and weight loss, by adopting a carbohydrate restricted/protein rich diet.  -  Suggestion is made for him to avoid simple carbohydrates  from his diet including Cakes, Sweet Desserts / Pastries, Ice Cream, Soda (diet and regular), Sweet Tea, Candies, Chips, Cookies, Store Bought Juices, Alcohol in Excess of  1-2 drinks a day, Artificial Sweeteners, and "Sugar-free" Products. This will help patient to have stable blood glucose profile and potentially avoid unintended weight gain.  - I encouraged him to switch to  unprocessed or minimally processed complex starch and increased protein intake (animal or plant source), fruits, and vegetables.  - he is advised to stick to a routine mealtimes to eat 3 meals  a day and avoid unnecessary snacks ( to snack only to correct hypoglycemia).   - I have approached him with the following  individualized plan to manage diabetes and patient agrees:   - Based on his glucose profile, he will  need even higher dose of insulin in the basal /bolus   fashion for him to achieve control of diabetes to target.   -   I advised him to switch his Lantus injections to 8 PM every day so that he want me if his doses.  I advised him to continue Lantus 80 units nightly, continue NovoLog  25  units 3 times a day before meals for pre-meal blood glucose readings above 90 mg/dL (with additional correction for readings above 150 mg/dL)  associated with strict monitoring of blood glucose 4 times a day- before meals and at bedtime. - He is advised to stay committed for proper monitoring for safe use of insulin.  -Patient is encouraged to call clinic for blood glucose levels less than 70 or above 300 mg /dl. - He is not a candidate for metformin therapy due to CKD.  - He  will be considered for incretin therapy when he gets appropriate insurance coverage.  - Patient specific target  A1c;  LDL, HDL, Triglycerides, and  Waist Circumference were discussed in detail.  2) BP/HTN: His blood pressure is controlled to target.  I advised him to continue his current blood pressure medications including carvedilol 6.25 mg p.o. twice daily. 3) Lipids/HPL: He has significant hypertriglyceridemia.  He is advised to continue simvastatin 40 mg p.o. nightly along with his cholestyramine 4 g 3 times a day.   4)  Weight/Diet: CDE Consult has been initiated , exercise, and detailed carbohydrates information provided.  5) Chronic Care/Health Maintenance:  -he  is on ACEI/ARB and Statin medications and  is encouraged to continue to follow up with Ophthalmology, Dentist,  Podiatrist at least yearly or according to recommendations. -He is extensively counseled against smoking. I have recommended yearly flu vaccine and pneumonia vaccination at least every 5 years; and  sleep for at least 7 hours a day.  - I advised patient to maintain close follow up with Glenda Chroman, MD for primary care needs.  - Time spent with the patient: 25 min, of which >50% was spent in reviewing his blood  glucose logs , discussing his hypo- and hyper-glycemic episodes, reviewing his current and  previous labs and insulin doses and developing a plan to avoid hypo- and hyper-glycemia. Please refer to Patient Instructions for Blood Glucose Monitoring and Insulin/Medications Dosing Guide"  in media tab for additional information. Dylan Tyler participated in the discussions, expressed understanding, and voiced agreement with the above plans.  All questions were answered to his satisfaction. he is encouraged to contact clinic should he have any questions or concerns prior to his return visit.   Follow up plan: - Return in about 3 months (around 06/13/2018) for Follow up with Pre-visit Labs, Meter, and Logs.  Dylan Lloyd, MD Phone: [phone]  Fax: [phone]   03/14/2018, 2:54 PM This note was partially dictated with voice recognition software. Similar sounding words can be transcribed inadequately or may not  be corrected upon review.

## 2018-04-18 ENCOUNTER — Other Ambulatory Visit: Payer: Self-pay | Admitting: "Endocrinology

## 2018-04-27 DIAGNOSIS — M1711 Unilateral primary osteoarthritis, right knee: Secondary | ICD-10-CM | POA: Diagnosis not present

## 2018-04-28 DIAGNOSIS — Z299 Encounter for prophylactic measures, unspecified: Secondary | ICD-10-CM | POA: Diagnosis not present

## 2018-04-28 DIAGNOSIS — I10 Essential (primary) hypertension: Secondary | ICD-10-CM | POA: Diagnosis not present

## 2018-04-28 DIAGNOSIS — R110 Nausea: Secondary | ICD-10-CM | POA: Diagnosis not present

## 2018-04-28 DIAGNOSIS — J309 Allergic rhinitis, unspecified: Secondary | ICD-10-CM | POA: Diagnosis not present

## 2018-04-28 DIAGNOSIS — I739 Peripheral vascular disease, unspecified: Secondary | ICD-10-CM | POA: Diagnosis not present

## 2018-04-28 DIAGNOSIS — Z6837 Body mass index (BMI) 37.0-37.9, adult: Secondary | ICD-10-CM | POA: Diagnosis not present

## 2018-05-05 ENCOUNTER — Telehealth: Payer: Self-pay

## 2018-05-05 ENCOUNTER — Ambulatory Visit (INDEPENDENT_AMBULATORY_CARE_PROVIDER_SITE_OTHER): Payer: PPO | Admitting: *Deleted

## 2018-05-05 DIAGNOSIS — R001 Bradycardia, unspecified: Secondary | ICD-10-CM

## 2018-05-05 DIAGNOSIS — I429 Cardiomyopathy, unspecified: Secondary | ICD-10-CM

## 2018-05-05 NOTE — Telephone Encounter (Signed)
Confirmed remote transmission w/ pt daughter.   

## 2018-05-06 NOTE — Progress Notes (Signed)
Remote pacemaker transmission.   

## 2018-05-15 ENCOUNTER — Encounter: Payer: Self-pay | Admitting: Cardiology

## 2018-05-16 ENCOUNTER — Other Ambulatory Visit: Payer: Self-pay | Admitting: Cardiology

## 2018-05-19 ENCOUNTER — Other Ambulatory Visit: Payer: Self-pay | Admitting: "Endocrinology

## 2018-05-26 DIAGNOSIS — I10 Essential (primary) hypertension: Secondary | ICD-10-CM | POA: Diagnosis not present

## 2018-05-26 DIAGNOSIS — F419 Anxiety disorder, unspecified: Secondary | ICD-10-CM | POA: Diagnosis not present

## 2018-05-26 DIAGNOSIS — I4891 Unspecified atrial fibrillation: Secondary | ICD-10-CM | POA: Diagnosis not present

## 2018-05-26 DIAGNOSIS — Z6836 Body mass index (BMI) 36.0-36.9, adult: Secondary | ICD-10-CM | POA: Diagnosis not present

## 2018-05-26 DIAGNOSIS — Z299 Encounter for prophylactic measures, unspecified: Secondary | ICD-10-CM | POA: Diagnosis not present

## 2018-05-26 DIAGNOSIS — R5383 Other fatigue: Secondary | ICD-10-CM | POA: Diagnosis not present

## 2018-06-09 DIAGNOSIS — Z6834 Body mass index (BMI) 34.0-34.9, adult: Secondary | ICD-10-CM | POA: Diagnosis not present

## 2018-06-09 DIAGNOSIS — E6609 Other obesity due to excess calories: Secondary | ICD-10-CM | POA: Diagnosis not present

## 2018-06-09 DIAGNOSIS — E1122 Type 2 diabetes mellitus with diabetic chronic kidney disease: Secondary | ICD-10-CM | POA: Diagnosis not present

## 2018-06-09 DIAGNOSIS — E1165 Type 2 diabetes mellitus with hyperglycemia: Secondary | ICD-10-CM | POA: Diagnosis not present

## 2018-06-09 DIAGNOSIS — N183 Chronic kidney disease, stage 3 (moderate): Secondary | ICD-10-CM | POA: Diagnosis not present

## 2018-06-09 LAB — HEMOGLOBIN A1C: Hemoglobin A1C: 8.1

## 2018-06-10 ENCOUNTER — Other Ambulatory Visit: Payer: Self-pay | Admitting: *Deleted

## 2018-06-10 MED ORDER — CARVEDILOL 6.25 MG PO TABS: 6.2500 mg | ORAL_TABLET | Freq: Two times a day (BID) | ORAL | 0 refills | Status: DC

## 2018-06-13 ENCOUNTER — Encounter: Payer: Self-pay | Admitting: "Endocrinology

## 2018-06-13 ENCOUNTER — Ambulatory Visit (INDEPENDENT_AMBULATORY_CARE_PROVIDER_SITE_OTHER): Payer: PPO | Admitting: "Endocrinology

## 2018-06-13 VITALS — BP 141/89 | HR 84 | Ht 69.0 in | Wt 250.0 lb

## 2018-06-13 DIAGNOSIS — E1165 Type 2 diabetes mellitus with hyperglycemia: Secondary | ICD-10-CM | POA: Diagnosis not present

## 2018-06-13 DIAGNOSIS — E782 Mixed hyperlipidemia: Secondary | ICD-10-CM | POA: Diagnosis not present

## 2018-06-13 DIAGNOSIS — IMO0002 Reserved for concepts with insufficient information to code with codable children: Secondary | ICD-10-CM

## 2018-06-13 DIAGNOSIS — E1122 Type 2 diabetes mellitus with diabetic chronic kidney disease: Secondary | ICD-10-CM | POA: Diagnosis not present

## 2018-06-13 DIAGNOSIS — N183 Chronic kidney disease, stage 3 (moderate): Secondary | ICD-10-CM | POA: Diagnosis not present

## 2018-06-13 DIAGNOSIS — I10 Essential (primary) hypertension: Secondary | ICD-10-CM

## 2018-06-13 MED ORDER — FREESTYLE LIBRE 14 DAY SENSOR MISC: 1.0000 | 2 refills | Status: DC

## 2018-06-13 MED ORDER — FREESTYLE LIBRE 14 DAY READER DEVI: 1.0000 | 0 refills | Status: DC

## 2018-06-13 NOTE — Patient Instructions (Signed)

## 2018-06-13 NOTE — Progress Notes (Signed)
Endocrinology follow-up note   Subjective:    Patient ID: Dylan Tyler, male    DOB: 10/09/1945.  he is being seen in consultation for management of currently uncontrolled symptomatic diabetes type 2 diabetes, hypertension, hyperlipidemia. PMD:  Glenda Chroman, MD.   Past Medical History:  Diagnosis Date  . Ankylosing spondylitis ([place])   . Anxiety   . Aortic root dilatation ([facility])    Mild  . AR (aortic regurgitation)    Mild  . Atrial fibrillation and flutter ([place])   . Bladder cancer ([place])   . Chronic pain    Followed at [facility] [place]  . DDD (degenerative disc disease), lumbosacral   . DDD (degenerative disc disease), thoracolumbar   . Degenerative cervical disc   . Depression   . Diabetes mellitus type II   . Diverticulitis   . Essential hypertension, benign   . GERD (gastroesophageal reflux disease)   . Hyperlipidemia   . Kidney stones   . Migraine   . Nonischemic cardiomyopathy ([place])    Cardiolite 12/09 LVEF 55%, minor luminal irregularities at cardiac catheterization  . OSA on CPAP   . Rheumatoid arthritis ([place])   . Symptomatic bradycardia    Medtronic Advisa L dual-chamber pacemaker  05/2015 - Dr. Curt Bears   Past Surgical History:  Procedure Laterality Date  . CARDIAC CATHETERIZATION  08/13/11  . CARDIOVERSION N/A 07/02/2015   Procedure: CARDIOVERSION;  Surgeon: Sanda Klein, MD;  Location: [facility] END[facility];  Service: Cardiovascular;  Laterality: N/A;  . CYSTOSCOPY WITH HOLMIUM LASER LITHOTRIPSY  1990's  . EP IMPLANTABLE DEVICE N/A 05/21/2015   MDT Advisa DR MRI pacemaker implanted by Dr Curt Bears  . KNEE ARTHROSCOPY Right ~ 1991  . LAPAROSCOPIC CHOLECYSTECTOMY  ~ 2008  . TRANSURETHRAL RESECTION OF BLADDER TUMOR WITH GYRUS (TURBT-GYRUS)  1990's?   Social History   Socioeconomic History  . Marital status: Divorced    Spouse name: Not on file  . Number of children: Not on file  . Years of education: Not on file  . Highest education level: Not on file  Occupational  History  . Occupation: Disabled    Employer: DISABLED  Social Needs  . Financial resource strain: Not on file  . Food insecurity:    Worry: Not on file    Inability: Not on file  . Transportation needs:    Medical: Not on file    Non-medical: Not on file  Tobacco Use  . Smoking status: Current Every Day Smoker    Packs/day: 0.30    Years: 50.00    Pack years: 15.00    Types: E-cigarettes    Start date: 02/26/1959    Last attempt to quit: 12/06/2013    Years since quitting: 4.5  . Smokeless tobacco: Former Systems developer    Types: Chew  . Tobacco comment: 05/21/2015 "quit chewing in 1992"  Substance and Sexual Activity  . Alcohol use: Yes    Alcohol/week: 2.0 standard drinks    Types: 2 Cans of beer per week  . Drug use: Yes    Types: Cocaine    Comment: 05/21/2015 "quit in the early 2000"  . Sexual activity: Not on file  Lifestyle  . Physical activity:    Days per week: Not on file    Minutes per session: Not on file  . Stress: Not on file  Relationships  . Social connections:    Talks on phone: Not on file    Gets together: Not on file    Attends religious service:  Not on file    Active member of club or organization: Not on file    Attends meetings of clubs or organizations: Not on file    Relationship status: Not on file  Other Topics Concern  . Not on file  Social History Narrative   Divorced   No regular exercise   Outpatient Encounter Medications as of 06/13/2018  Medication Sig  . acetaminophen (TYLENOL) 500 MG tablet Take 500 mg by mouth every 6 (six) hours as needed.  . Continuous Blood Gluc Receiver (FREESTYLE LIBRE 14 DAY READER) DEVI 1 each by Does not apply route every 14 (fourteen) days.  . Continuous Blood Gluc Sensor (FREESTYLE LIBRE 14 DAY SENSOR) MISC 1 each by Does not apply route every 14 (fourteen) days.  . [DISCONTINUED] Continuous Blood Gluc Receiver (FREESTYLE LIBRE 14 DAY READER) DEVI by Does not apply route every 14 (fourteen) days.  .  [DISCONTINUED] Continuous Blood Gluc Sensor (FREESTYLE LIBRE 14 DAY SENSOR) MISC by Does not apply route every 14 (fourteen) days.  Marland Kitchen acyclovir (ZOVIRAX) 400 MG tablet Take 2 tablets (800 mg total) by mouth 5 (five) times daily.  Marland Kitchen albuterol (PROVENTIL) (2.5 MG/3ML) 0.083% nebulizer solution Take 2.5 mg by nebulization every 6 (six) hours as needed for wheezing.  Marland Kitchen ALPRAZolam (XANAX) 0.5 MG tablet Take 0.5 mg by mouth 2 (two) times daily as needed.  Marland Kitchen apixaban (ELIQUIS) 5 MG TABS tablet Take 1 tablet (5 mg total) by mouth 2 (two) times daily.  . BD PEN NEEDLE NANO U/F 32G X 4 MM MISC USE ONE AS DIRECTED  . carvedilol (COREG) 6.25 MG tablet Take 1 tablet (6.25 mg total) by mouth 2 (two) times daily with a meal.  . Cholecalciferol (VITAMIN D-3) 1000 UNITS CAPS Take 1,000 Units by mouth 2 (two) times daily.   . cholestyramine (QUESTRAN) 4 g packet Take 4 g by mouth 3 (three) times daily with meals.  . citalopram (CELEXA) 40 MG tablet Take 20-40 mg by mouth daily.   Marland Kitchen doxycycline (VIBRA-TABS) 100 MG tablet Take 100 mg by mouth 2 (two) times daily.  Marland Kitchen ELIQUIS 5 MG TABS tablet TAKE ONE TABLET BY MOUTH TWICE DAILY  . FREESTYLE LITE test strip USE TO CHECK BLOOD SUGAR FOUR TIMES DAILY.  Marland Kitchen gabapentin (NEURONTIN) 400 MG capsule Take 400 mg by mouth 3 (three) times daily.   Marland Kitchen ibuprofen (ADVIL,MOTRIN) 200 MG tablet Take 400 mg by mouth every 6 (six) hours as needed for headache or moderate pain.  Marland Kitchen insulin aspart (NOVOLOG FLEXPEN) 100 UNIT/ML FlexPen Inject 25-31 Units into the skin 3 (three) times daily with meals.  . Insulin Pen Needle (B-D ULTRAFINE III SHORT PEN) 31G X 8 MM MISC Use to inject insulin 4 x daily  . ipratropium-albuterol (DUONEB) 0.5-2.5 (3) MG/3ML SOLN Take 3 mLs by nebulization every 4 (four) hours as needed (for shortness of breath).  Marland Kitchen LANTUS SOLOSTAR 100 UNIT/ML Solostar Pen INJECT 80 UNITS INTO SKIN DAILY AT 10PM.  . metoCLOPramide (REGLAN) 5 MG tablet Take 5 mg by mouth every 6 (six)  hours as needed for nausea or vomiting.   . nitroGLYCERIN (NITROSTAT) 0.4 MG SL tablet Place 1 tablet (0.4 mg total) under the tongue every 5 (five) minutes as needed for chest pain (up to 3 doses. If no relief after the 3 rd dose, proceed to the ED for an evaluation). May repeat for up to 3 doses.  . NON FORMULARY 1 each by Other route See admin instructions. CPAP - Uses  nightly  . NOVOLOG FLEXPEN 100 UNIT/ML FlexPen INJECT 25-31 UNITS SUBCUTANEOUSLY THREE TIMES DAILY AS DIRECTED. TAKE WITH MEALS.  . simvastatin (ZOCOR) 40 MG tablet TAKE ONE TABLET BY MOUTH AT BEDTIME  . vitamin B-12 (CYANOCOBALAMIN) 1000 MCG tablet Take 1,000 mcg by mouth 2 (two) times daily.    . VOLTAREN 1 % GEL Apply 2 g topically 4 (four) times daily as needed (for pain).    No facility-administered encounter medications on file as of 06/13/2018.     ALLERGIES: No Known Allergies  VACCINATION STATUS:  There is no immunization history on file for this patient.  Diabetes  He presents for his follow-up diabetic visit. He has type 2 diabetes mellitus. Onset time: He was diagnosed at approximate age of 47 years. His disease course has been improving (His most recent A1c was 9.8% on 01/12/2017.). There are no hypoglycemic associated symptoms. Pertinent negatives for hypoglycemia include no confusion, headaches, pallor or seizures. Associated symptoms include foot paresthesias, polydipsia and polyuria. Pertinent negatives for diabetes include no chest pain, no fatigue, no polyphagia and no weakness. There are no hypoglycemic complications. Symptoms are improving. Diabetic complications include peripheral neuropathy. Risk factors for coronary artery disease include dyslipidemia, diabetes mellitus, hypertension, male sex, sedentary lifestyle, tobacco exposure and family history. Current diabetic treatment includes oral agent (dual therapy). His weight is fluctuating minimally. He is following a generally unhealthy diet. When asked  about meal planning, he reported none. He has not had a previous visit with a dietitian. He never (He is wheelchair-bound due to deconditioning, and due todegenerative joint disease.) participates in exercise. His breakfast blood glucose range is generally >200 mg/dl. His lunch blood glucose range is generally 180-200 mg/dl. His dinner blood glucose range is generally 180-200 mg/dl. His bedtime blood glucose range is generally 180-200 mg/dl. His overall blood glucose range is >200 mg/dl. An ACE inhibitor/angiotensin II receptor blocker is being taken. He sees a podiatrist. Hyperlipidemia  This is a chronic problem. The current episode started more than 1 year ago. Exacerbating diseases include diabetes and obesity. Factors aggravating his hyperlipidemia include smoking. Associated symptoms include shortness of breath. Pertinent negatives include no chest pain or myalgias. Current antihyperlipidemic treatment includes statins. Risk factors for coronary artery disease include dyslipidemia, diabetes mellitus, hypertension, a sedentary lifestyle, male sex and obesity.  Hypertension  This is a chronic problem. The current episode started more than 1 year ago. The problem is controlled. Associated symptoms include shortness of breath. Pertinent negatives include no chest pain, headaches, neck pain or palpitations. Risk factors for coronary artery disease include diabetes mellitus, dyslipidemia, male gender, obesity, sedentary lifestyle and smoking/tobacco exposure. Past treatments include ACE inhibitors.    Review of Systems  Constitutional: Negative for chills, fatigue, fever and unexpected weight change.  HENT: Negative for dental problem, mouth sores and trouble swallowing.   Eyes: Negative for visual disturbance.  Respiratory: Positive for cough and shortness of breath. Negative for choking, chest tightness and wheezing.   Cardiovascular: Negative for chest pain, palpitations and leg swelling.   Gastrointestinal: Negative for abdominal distention, abdominal pain, constipation, diarrhea, nausea and vomiting.  Endocrine: Positive for polydipsia and polyuria. Negative for polyphagia.  Genitourinary: Negative for dysuria, flank pain, hematuria and urgency.  Musculoskeletal: Positive for gait problem. Negative for back pain, myalgias and neck pain.       Walks with a cane.  Skin: Negative for pallor, rash and wound.  Neurological: Negative for seizures, syncope, weakness, numbness and headaches.  Psychiatric/Behavioral: Negative for confusion  and dysphoric mood.    Objective:    BP (!) 141/89   Pulse 84   Ht 5\' 9"  (1.753 m)   Wt 250 lb (113.4 kg)   BMI 36.92 kg/m   Wt Readings from Last 3 Encounters:  06/13/18 250 lb (113.4 kg)  03/14/18 242 lb (109.8 kg)  12/09/17 242 lb (109.8 kg)     Physical Exam  Constitutional: He is oriented to person, place, and time. He appears well-developed. He is cooperative. No distress.  Mr. Chaves walks with a cane.  HENT:  Head: Normocephalic and atraumatic.  Eyes: EOM are normal.  Neck: Normal range of motion. Neck supple. No tracheal deviation present. No thyromegaly present.  Cardiovascular: Normal rate, S1 normal and S2 normal. Exam reveals no gallop.  No murmur heard. Pulses:      Dorsalis pedis pulses are 1+ on the right side, and 1+ on the left side.       Posterior tibial pulses are 1+ on the right side, and 1+ on the left side.  Pulmonary/Chest: Effort normal. No respiratory distress. He has no wheezes.  Abdominal: He exhibits no distension. There is no tenderness. There is no guarding and no CVA tenderness.  Musculoskeletal: He exhibits no edema.       Right shoulder: He exhibits no swelling and no deformity.  Uses walker to get around.  Neurological: He is alert and oriented to person, place, and time. He has normal strength. No cranial nerve deficit or sensory deficit. Gait normal.  Skin: Skin is warm and dry. No rash noted.  No cyanosis. Nails show no clubbing.  Psychiatric: He has a normal mood and affect. His speech is normal. Cognition and memory are normal.    CMP     Component Value Date/Time   NA 138 10/20/2017 1608   K 4.9 10/20/2017 1608   CL 107 10/20/2017 1608   CO2 20 (L) 10/20/2017 1600   GLUCOSE 211 (H) 10/20/2017 1608   BUN 16 03/11/2018   CREATININE 1.5 (A) 03/11/2018   CREATININE 1.50 (H) 10/20/2017 1608   CREATININE 1.29 (H) 04/12/2015 1609   CALCIUM 9.9 10/20/2017 1600   PROT 7.4 10/20/2017 1600   ALBUMIN 3.9 10/20/2017 1600   AST 24 10/20/2017 1600   ALT 24 10/20/2017 1600   ALKPHOS 69 10/20/2017 1600   BILITOT 0.8 10/20/2017 1600   GFRNONAA 43 (L) 10/20/2017 1600   GFRAA 49 (L) 10/20/2017 1600    Lipid Panel     Component Value Date/Time   CHOL 111 12/06/2017   TRIG 423 (A) 12/06/2017   HDL 33 (A) 12/06/2017    A1c on 10 x 6 2019 was 8.2%. A1c on June 09, 2018 was 8.1%  Assessment & Plan:   1. Type 2 diabetes mellitus with stage 3 chronic kidney disease, without long-term current use of insulin ([place])  - Patient has currently uncontrolled symptomatic type 2 DM since  72 years of age. -Patient came with continued improvement with A1c of 8.1%, generally improving from 10.1%.   -He is more consistent injecting his basal insulin this time at 8 PM every evening.  -his diabetes is complicated by stage 3 renal insufficiency, obesity/sedentary life, neuropathy, cardiomyopathy, chronic heavy smoking, inadequate insurance and WAYLON HERSHEY remains at a high risk for more acute and chronic complications which include CAD, CVA, CKD, retinopathy, and neuropathy. These are all discussed in detail with the patient.  - I have counseled him on diet management and weight loss, by  adopting a carbohydrate restricted/protein rich diet.  -  Suggestion is made for him to avoid simple carbohydrates  from his diet including Cakes, Sweet Desserts / Pastries, Ice Cream, Soda (diet and  regular), Sweet Tea, Candies, Chips, Cookies, Store Bought Juices, Alcohol in Excess of  1-2 drinks a day, Artificial Sweeteners, and "Sugar-free" Products. This will help patient to have stable blood glucose profile and potentially avoid unintended weight gain.   - I encouraged him to switch to  unprocessed or minimally processed complex starch and increased protein intake (animal or plant source), fruits, and vegetables.  - he is advised to stick to a routine mealtimes to eat 3 meals  a day and avoid unnecessary snacks ( to snack only to correct hypoglycemia).   - I have approached him with the following individualized plan to manage diabetes and patient agrees:   - Based on his glucose profile, he will continue to need intensive treatment with insulin in order for him to maintain control of diabetes to target.   -He is advised to continue Lantus 80 units at 8 PM every day so that he won't miss  his doses.  -He is advised to continue NovoLog 25 units  units 3 times a day before meals for pre-meal blood glucose readings above 90 mg/dL (with additional correction for readings above 150 mg/dL)  associated with strict monitoring of blood glucose 4 times a day- before meals and at bedtime. - He is advised to stay committed for proper monitoring for safe use of insulin.  -Patient is encouraged to call clinic for blood glucose levels less than 70 or above 300 mg /dl. - He is not a candidate for metformin therapy due to CKD.  - He  will be considered for incretin therapy when he gets appropriate insurance coverage.  - Patient specific target  A1c;  LDL, HDL, Triglycerides, and  Waist Circumference were discussed in detail.  2) BP/HTN: His blood pressure is not controlled to target.  He is advised to continue his current blood pressure medications including carvedilol 6.25 mg p.o. twice daily. 3) Lipids/HPL: He has significant hypertriglyceridemia.  He is advised to continue simvastatin 40 mg p.o.  nightly along with his cholestyramine 4 g 3 times a day.   4)  Weight/Diet: CDE Consult has been initiated , exercise, and detailed carbohydrates information provided.  5) Chronic Care/Health Maintenance:  -he  is on ACEI/ARB and Statin medications and  is encouraged to continue to follow up with Ophthalmology, Dentist,  Podiatrist at least yearly or according to recommendations. -He is extensively counseled against smoking. I have recommended yearly flu vaccine and pneumonia vaccination at least every 5 years; and  sleep for at least 7 hours a day.  - I advised patient to maintain close follow up with Glenda Chroman, MD for primary care needs.  - Time spent with the patient: 25 min, of which >50% was spent in reviewing his blood glucose logs , discussing his hypo- and hyper-glycemic episodes, reviewing his current and  previous labs and insulin doses and developing a plan to avoid hypo- and hyper-glycemia. Please refer to Patient Instructions for Blood Glucose Monitoring and Insulin/Medications Dosing Guide"  in media tab for additional information. [name] participated in the discussions, expressed understanding, and voiced agreement with the above plans.  All questions were answered to his satisfaction. he is encouraged to contact clinic should he have any questions or concerns prior to his return visit.  Follow up plan: -  Return in about 4 months (around 10/13/2018) for Meter, and Logs.  Glade Lloyd, MD Phone: [phone]  Fax: [phone]   06/13/2018, 3:35 PM This note was partially dictated with voice recognition software. Similar sounding words can be transcribed inadequately or may not  be corrected upon review.

## 2018-07-05 LAB — CUP PACEART REMOTE DEVICE CHECK
Battery Remaining Longevity: 49 mo
Battery Voltage: 2.99 V
Brady Statistic AP VP Percent: 0 %
Brady Statistic AP VS Percent: 0 %
Brady Statistic AS VP Percent: 94.08 %
Brady Statistic AS VS Percent: 5.92 %
Brady Statistic RA Percent Paced: 0 %
Date Time Interrogation Session: 20191031180754
Implantable Lead Implant Date: 20161115
Implantable Lead Implant Date: 20161115
Implantable Lead Location: 753859
Implantable Lead Model: 5076
Implantable Pulse Generator Implant Date: 20161115
Lead Channel Impedance Value: 361 Ohm
Lead Channel Impedance Value: 361 Ohm
Lead Channel Impedance Value: 456 Ohm
Lead Channel Impedance Value: 513 Ohm
Lead Channel Pacing Threshold Amplitude: 0.5 V
Lead Channel Pacing Threshold Amplitude: 0.875 V
Lead Channel Pacing Threshold Pulse Width: 0.4 ms
Lead Channel Pacing Threshold Pulse Width: 0.4 ms
Lead Channel Sensing Intrinsic Amplitude: 1.625 mV
Lead Channel Sensing Intrinsic Amplitude: 6.5 mV
Lead Channel Setting Pacing Amplitude: 2.5 V
Lead Channel Setting Pacing Pulse Width: 0.4 ms
Lead Channel Setting Sensing Sensitivity: 2.8 mV
MDC IDC LEAD LOCATION: 753860
MDC IDC MSMT LEADCHNL RA SENSING INTR AMPL: 1.625 mV
MDC IDC MSMT LEADCHNL RV SENSING INTR AMPL: 6.5 mV
MDC IDC STAT BRADY RV PERCENT PACED: 95.12 %

## 2018-07-26 DIAGNOSIS — Z299 Encounter for prophylactic measures, unspecified: Secondary | ICD-10-CM | POA: Diagnosis not present

## 2018-07-26 DIAGNOSIS — Z6837 Body mass index (BMI) 37.0-37.9, adult: Secondary | ICD-10-CM | POA: Diagnosis not present

## 2018-07-26 DIAGNOSIS — J449 Chronic obstructive pulmonary disease, unspecified: Secondary | ICD-10-CM | POA: Diagnosis not present

## 2018-07-26 DIAGNOSIS — I10 Essential (primary) hypertension: Secondary | ICD-10-CM | POA: Diagnosis not present

## 2018-07-26 DIAGNOSIS — I739 Peripheral vascular disease, unspecified: Secondary | ICD-10-CM | POA: Diagnosis not present

## 2018-07-26 DIAGNOSIS — I4891 Unspecified atrial fibrillation: Secondary | ICD-10-CM | POA: Diagnosis not present

## 2018-07-26 DIAGNOSIS — F17210 Nicotine dependence, cigarettes, uncomplicated: Secondary | ICD-10-CM | POA: Diagnosis not present

## 2018-08-02 DIAGNOSIS — E1140 Type 2 diabetes mellitus with diabetic neuropathy, unspecified: Secondary | ICD-10-CM | POA: Diagnosis not present

## 2018-08-02 DIAGNOSIS — E1159 Type 2 diabetes mellitus with other circulatory complications: Secondary | ICD-10-CM | POA: Diagnosis not present

## 2018-08-04 ENCOUNTER — Ambulatory Visit (INDEPENDENT_AMBULATORY_CARE_PROVIDER_SITE_OTHER): Payer: PPO

## 2018-08-04 DIAGNOSIS — R001 Bradycardia, unspecified: Secondary | ICD-10-CM

## 2018-08-05 LAB — CUP PACEART REMOTE DEVICE CHECK
Battery Remaining Longevity: 45 mo
Battery Voltage: 2.98 V
Brady Statistic AP VS Percent: 0 %
Brady Statistic AS VP Percent: 94.28 %
Brady Statistic AS VS Percent: 5.72 %
Brady Statistic RA Percent Paced: 0 %
Brady Statistic RV Percent Paced: 95.43 %
Date Time Interrogation Session: 20200131010557
Implantable Lead Implant Date: 20161115
Implantable Lead Implant Date: 20161115
Implantable Lead Location: 753859
Implantable Lead Location: 753860
Implantable Lead Model: 5076
Implantable Lead Model: 5076
Implantable Pulse Generator Implant Date: 20161115
Lead Channel Impedance Value: 361 Ohm
Lead Channel Impedance Value: 361 Ohm
Lead Channel Impedance Value: 456 Ohm
Lead Channel Impedance Value: 513 Ohm
Lead Channel Pacing Threshold Amplitude: 0.5 V
Lead Channel Pacing Threshold Amplitude: 0.75 V
Lead Channel Pacing Threshold Pulse Width: 0.4 ms
Lead Channel Pacing Threshold Pulse Width: 0.4 ms
Lead Channel Sensing Intrinsic Amplitude: 0.875 mV
Lead Channel Sensing Intrinsic Amplitude: 0.875 mV
Lead Channel Sensing Intrinsic Amplitude: 6.875 mV
Lead Channel Setting Pacing Amplitude: 2.5 V
Lead Channel Setting Pacing Pulse Width: 0.4 ms
Lead Channel Setting Sensing Sensitivity: 2.8 mV
MDC IDC MSMT LEADCHNL RV SENSING INTR AMPL: 6.875 mV
MDC IDC STAT BRADY AP VP PERCENT: 0 %

## 2018-08-12 NOTE — Progress Notes (Signed)
Remote pacemaker transmission.   

## 2018-08-15 ENCOUNTER — Encounter: Payer: Self-pay | Admitting: Cardiology

## 2018-08-22 ENCOUNTER — Other Ambulatory Visit: Payer: Self-pay | Admitting: "Endocrinology

## 2018-08-29 ENCOUNTER — Other Ambulatory Visit: Payer: Self-pay | Admitting: "Endocrinology

## 2018-09-02 ENCOUNTER — Other Ambulatory Visit: Payer: Self-pay | Admitting: Cardiology

## 2018-09-14 DIAGNOSIS — R0602 Shortness of breath: Secondary | ICD-10-CM | POA: Diagnosis not present

## 2018-09-14 DIAGNOSIS — E1165 Type 2 diabetes mellitus with hyperglycemia: Secondary | ICD-10-CM | POA: Diagnosis not present

## 2018-09-14 DIAGNOSIS — E1122 Type 2 diabetes mellitus with diabetic chronic kidney disease: Secondary | ICD-10-CM | POA: Diagnosis not present

## 2018-09-14 DIAGNOSIS — N183 Chronic kidney disease, stage 3 (moderate): Secondary | ICD-10-CM | POA: Diagnosis not present

## 2018-09-14 DIAGNOSIS — R05 Cough: Secondary | ICD-10-CM | POA: Diagnosis not present

## 2018-09-14 LAB — HEMOGLOBIN A1C: Hemoglobin A1C: 9.7

## 2018-09-14 LAB — BASIC METABOLIC PANEL
BUN: 16 (ref 4–21)
Creatinine: 1.5 — AB (ref 0.6–1.3)

## 2018-09-23 DIAGNOSIS — I739 Peripheral vascular disease, unspecified: Secondary | ICD-10-CM | POA: Diagnosis not present

## 2018-09-23 DIAGNOSIS — J441 Chronic obstructive pulmonary disease with (acute) exacerbation: Secondary | ICD-10-CM | POA: Diagnosis not present

## 2018-09-23 DIAGNOSIS — Z299 Encounter for prophylactic measures, unspecified: Secondary | ICD-10-CM | POA: Diagnosis not present

## 2018-09-23 DIAGNOSIS — I4891 Unspecified atrial fibrillation: Secondary | ICD-10-CM | POA: Diagnosis not present

## 2018-09-23 DIAGNOSIS — I10 Essential (primary) hypertension: Secondary | ICD-10-CM | POA: Diagnosis not present

## 2018-10-04 ENCOUNTER — Ambulatory Visit: Payer: PPO | Admitting: Cardiology

## 2018-10-05 ENCOUNTER — Other Ambulatory Visit: Payer: Self-pay | Admitting: Cardiology

## 2018-10-05 ENCOUNTER — Other Ambulatory Visit: Payer: Self-pay | Admitting: "Endocrinology

## 2018-10-07 DIAGNOSIS — E1165 Type 2 diabetes mellitus with hyperglycemia: Secondary | ICD-10-CM | POA: Diagnosis not present

## 2018-10-07 DIAGNOSIS — E1142 Type 2 diabetes mellitus with diabetic polyneuropathy: Secondary | ICD-10-CM | POA: Diagnosis not present

## 2018-10-07 DIAGNOSIS — R29810 Facial weakness: Secondary | ICD-10-CM | POA: Diagnosis not present

## 2018-10-07 DIAGNOSIS — M7712 Lateral epicondylitis, left elbow: Secondary | ICD-10-CM | POA: Diagnosis not present

## 2018-10-07 DIAGNOSIS — E119 Type 2 diabetes mellitus without complications: Secondary | ICD-10-CM | POA: Diagnosis not present

## 2018-10-07 DIAGNOSIS — J449 Chronic obstructive pulmonary disease, unspecified: Secondary | ICD-10-CM | POA: Diagnosis not present

## 2018-10-07 DIAGNOSIS — M67912 Unspecified disorder of synovium and tendon, left shoulder: Secondary | ICD-10-CM | POA: Diagnosis not present

## 2018-10-07 DIAGNOSIS — F17210 Nicotine dependence, cigarettes, uncomplicated: Secondary | ICD-10-CM | POA: Diagnosis not present

## 2018-10-07 DIAGNOSIS — Z299 Encounter for prophylactic measures, unspecified: Secondary | ICD-10-CM | POA: Diagnosis not present

## 2018-10-07 DIAGNOSIS — Z6837 Body mass index (BMI) 37.0-37.9, adult: Secondary | ICD-10-CM | POA: Diagnosis not present

## 2018-10-12 ENCOUNTER — Other Ambulatory Visit: Payer: Self-pay | Admitting: "Endocrinology

## 2018-10-13 ENCOUNTER — Encounter: Payer: Self-pay | Admitting: "Endocrinology

## 2018-10-13 ENCOUNTER — Other Ambulatory Visit: Payer: Self-pay

## 2018-10-13 ENCOUNTER — Ambulatory Visit (INDEPENDENT_AMBULATORY_CARE_PROVIDER_SITE_OTHER): Payer: PPO | Admitting: "Endocrinology

## 2018-10-13 DIAGNOSIS — E1165 Type 2 diabetes mellitus with hyperglycemia: Secondary | ICD-10-CM | POA: Diagnosis not present

## 2018-10-13 DIAGNOSIS — E1122 Type 2 diabetes mellitus with diabetic chronic kidney disease: Secondary | ICD-10-CM

## 2018-10-13 DIAGNOSIS — E782 Mixed hyperlipidemia: Secondary | ICD-10-CM | POA: Diagnosis not present

## 2018-10-13 DIAGNOSIS — IMO0002 Reserved for concepts with insufficient information to code with codable children: Secondary | ICD-10-CM

## 2018-10-13 DIAGNOSIS — N183 Chronic kidney disease, stage 3 (moderate): Secondary | ICD-10-CM

## 2018-10-13 MED ORDER — FREESTYLE LIBRE 14 DAY READER DEVI: 1.0000 | 0 refills | Status: DC

## 2018-10-13 MED ORDER — FREESTYLE LANCETS MISC: 5 refills | Status: AC

## 2018-10-13 MED ORDER — ALCOHOL SWABS 70 % PADS: MEDICATED_PAD | 2 refills | Status: AC

## 2018-10-13 MED ORDER — INSULIN GLARGINE 100 UNIT/ML SOLOSTAR PEN: PEN_INJECTOR | SUBCUTANEOUS | 4 refills | Status: DC

## 2018-10-13 MED ORDER — FREESTYLE LIBRE 14 DAY SENSOR MISC: 1.0000 | 2 refills | Status: DC

## 2018-10-13 NOTE — Progress Notes (Signed)
Endocrinology Telephone Visit Follow up Note -During COVID -19 Pandemic    Subjective:    Patient ID: Dylan Tyler, male    DOB: [birth year].  he is gauged in telephone visit in follow-up for management of currently uncontrolled, symptomatic type 2 diabetes, hyperlipidemia.    PMD:  Glenda Chroman, MD.   Past Medical History:  Diagnosis Date  . Ankylosing spondylitis ([place])   . Anxiety   . Aortic root dilatation (H[facility])    Mild  . AR (aortic regurgitation)    Mild  . Atrial fibrillation and flutter ([place])   . Bladder cancer ([place])   . Chronic pain    Followed at [facility]  . DDD (degenerative disc disease), lumbosacral   . DDD (degenerative disc disease), thoracolumbar   . Degenerative cervical disc   . Depression   . Diabetes mellitus type II   . Diverticulitis   . Essential hypertension, benign   . GERD (gastroesophageal reflux disease)   . Hyperlipidemia   . Kidney stones   . Migraine   . Nonischemic cardiomyopathy ([place])    Cardiolite 12/09 LVEF 55%, minor luminal irregularities at cardiac catheterization  . OSA on CPAP   . Rheumatoid arthritis ([place])   . Symptomatic bradycardia    Medtronic Advisa L dual-chamber pacemaker  05/2015 - Dr. Curt Bears   Past Surgical History:  Procedure Laterality Date  . CARDIAC CATHETERIZATION  08/13/11  . CARDIOVERSION N/A 07/02/2015   Procedure: CARDIOVERSION;  Surgeon: Sanda Klein, MD;  Location: [facility];  Service: Cardiovascular;  Laterality: N/A;  . CYSTOSCOPY WITH HOLMIUM LASER LITHOTRIPSY  1990's  . EP IMPLANTABLE DEVICE N/A 05/21/2015   MDT Advisa DR MRI pacemaker implanted by Dr Curt Bears  . KNEE ARTHROSCOPY Right ~ 1991  . LAPAROSCOPIC CHOLECYSTECTOMY  ~ 2008  . TRANSURETHRAL RESECTION OF BLADDER TUMOR WITH GYRUS (TURBT-GYRUS)  1990's?   Social History   Socioeconomic History  . Marital status: Divorced    Spouse name: Not on file  . Number of children: Not on file  .  Years of education: Not on file  . Highest education level: Not on file  Occupational History  . Occupation: Disabled    Employer: DISABLED  Social Needs  . Financial resource strain: Not on file  . Food insecurity:    Worry: Not on file    Inability: Not on file  . Transportation needs:    Medical: Not on file    Non-medical: Not on file  Tobacco Use  . Smoking status: Current Every Day Smoker    Packs/day: 0.30    Years: 50.00    Pack years: 15.00    Types: E-cigarettes    Start date: 02/26/1959    Last attempt to quit: 12/06/2013    Years since quitting: 4.8  . Smokeless tobacco: Former Systems developer    Types: Chew  . Tobacco comment: 05/21/2015 "quit chewing in 1992"  Substance and Sexual Activity  . Alcohol use: Yes    Alcohol/week: 2.0 standard drinks    Types: 2 Cans of beer per week  . Drug use: Yes    Types: Cocaine    Comment: 05/21/2015 "quit in the early 2000"  . Sexual activity:  Not on file  Lifestyle  . Physical activity:    Days per week: Not on file    Minutes per session: Not on file  . Stress: Not on file  Relationships  . Social connections:    Talks on phone: Not on file    Gets together: Not on file    Attends religious service: Not on file    Active member of club or organization: Not on file    Attends meetings of clubs or organizations: Not on file    Relationship status: Not on file  Other Topics Concern  . Not on file  Social History Narrative   Divorced   No regular exercise   Outpatient Encounter Medications as of 10/13/2018  Medication Sig  . acetaminophen (TYLENOL) 500 MG tablet Take 500 mg by mouth every 6 (six) hours as needed.  Marland Kitchen acyclovir (ZOVIRAX) 400 MG tablet Take 2 tablets (800 mg total) by mouth 5 (five) times daily.  Marland Kitchen albuterol (PROVENTIL) (2.5 MG/3ML) 0.083% nebulizer solution Take 2.5 mg by nebulization every 6 (six) hours as needed for wheezing.  . Alcohol Swabs 70 % PADS Use as directed  . ALPRAZolam (XANAX) 0.5 MG tablet Take  0.5 mg by mouth 2 (two) times daily as needed.  Marland Kitchen apixaban (ELIQUIS) 5 MG TABS tablet Take 1 tablet (5 mg total) by mouth 2 (two) times daily.  . BD PEN NEEDLE NANO U/F 32G X 4 MM MISC USE ONE AS DIRECTED - 4 injections per day  . carvedilol (COREG) 6.25 MG tablet Take 1 tablet (6.25 mg total) by mouth 2 (two) times daily with a meal.  . Cholecalciferol (VITAMIN D-3) 1000 UNITS CAPS Take 1,000 Units by mouth 2 (two) times daily.   . cholestyramine (QUESTRAN) 4 g packet Take 4 g by mouth 3 (three) times daily with meals.  . citalopram (CELEXA) 40 MG tablet Take 20-40 mg by mouth daily.   . Continuous Blood Gluc Receiver (FREESTYLE LIBRE 14 DAY READER) DEVI 1 each by Does not apply route every 14 (fourteen) days.  . Continuous Blood Gluc Sensor (FREESTYLE LIBRE 14 DAY SENSOR) MISC 1 each by Does not apply route every 14 (fourteen) days.  Marland Kitchen doxycycline (VIBRA-TABS) 100 MG tablet Take 100 mg by mouth 2 (two) times daily.  Marland Kitchen ELIQUIS 5 MG TABS tablet TAKE ONE TABLET BY MOUTH TWICE DAILY  . FREESTYLE LITE test strip USE TO CHECK BLOOD SUGAR FOUR TIMES DAILY.  Marland Kitchen gabapentin (NEURONTIN) 400 MG capsule Take 400 mg by mouth 3 (three) times daily.   Marland Kitchen ibuprofen (ADVIL,MOTRIN) 200 MG tablet Take 400 mg by mouth every 6 (six) hours as needed for headache or moderate pain.  . Insulin Glargine (LANTUS SOLOSTAR) 100 UNIT/ML Solostar Pen INJECT 90 UNITS INTO SKIN DAILY AT 10PM.  . Insulin Pen Needle (B-D ULTRAFINE III SHORT PEN) 31G X 8 MM MISC Use to inject insulin 4 x daily  . ipratropium-albuterol (DUONEB) 0.5-2.5 (3) MG/3ML SOLN Take 3 mLs by nebulization every 4 (four) hours as needed (for shortness of breath).  . Lancets (FREESTYLE) lancets USE TO CHECK BLOOD SUGAR FOUR TIMES DAILY.  Marland Kitchen metoCLOPramide (REGLAN) 5 MG tablet Take 5 mg by mouth every 6 (six) hours as needed for nausea or vomiting.   . nitroGLYCERIN (NITROSTAT) 0.4 MG SL tablet DISSOLVE ONE TABLET UNDER TONGUE EVERY 5 MINUTES UP TO 3 DOSES AS NEEDED  FOR CHEST PAIN. IF no RELIEF AFTER 3 doses, proceed TO THE ER  . NON FORMULARY 1  each by Other route See admin instructions. CPAP - Uses nightly  . NOVOLOG FLEXPEN 100 UNIT/ML FlexPen INJECT 25-31 UNITS SUBCUTANEOUSLY THREE TIMES DAILY AS DIRECTED. TAKE WITH MEALS.  . simvastatin (ZOCOR) 40 MG tablet TAKE ONE TABLET BY MOUTH AT BEDTIME  . vitamin B-12 (CYANOCOBALAMIN) 1000 MCG tablet Take 1,000 mcg by mouth 2 (two) times daily.    . VOLTAREN 1 % GEL Apply 2 g topically 4 (four) times daily as needed (for pain).   . [DISCONTINUED] Continuous Blood Gluc Receiver (FREESTYLE LIBRE 14 DAY READER) DEVI 1 each by Does not apply route every 14 (fourteen) days.  . [DISCONTINUED] Continuous Blood Gluc Sensor (FREESTYLE LIBRE 14 DAY SENSOR) MISC 1 each by Does not apply route every 14 (fourteen) days.  . [DISCONTINUED] insulin aspart (NOVOLOG FLEXPEN) 100 UNIT/ML FlexPen Inject 25-31 Units into the skin 3 (three) times daily with meals.  . [DISCONTINUED] Lancets (FREESTYLE) lancets USE TO CHECK BLOOD SUGAR FOUR TIMES DAILY.  . [DISCONTINUED] LANTUS SOLOSTAR 100 UNIT/ML Solostar Pen INJECT 80 UNITS INTO SKIN DAILY AT 10PM.   No facility-administered encounter medications on file as of 10/13/2018.     ALLERGIES: No Known Allergies  VACCINATION STATUS:  There is no immunization history on file for this patient.  Diabetes  He presents for his follow-up diabetic visit. He has type 2 diabetes mellitus. Onset time: He was diagnosed at approximate age of 28 years. His disease course has been worsening (His most recent A1c was 9.8% on 01/12/2017.). There are no hypoglycemic associated symptoms. Pertinent negatives for hypoglycemia include no confusion, pallor or seizures. Associated symptoms include foot paresthesias, polydipsia and polyuria. Pertinent negatives for diabetes include no fatigue, no polyphagia and no weakness. There are no hypoglycemic complications. Symptoms are improving. Diabetic complications  include peripheral neuropathy. Risk factors for coronary artery disease include dyslipidemia, diabetes mellitus, hypertension, male sex, sedentary lifestyle, tobacco exposure and family history. Current diabetic treatment includes oral agent (dual therapy). He is following a generally unhealthy diet. When asked about meal planning, he reported none. He has not had a previous visit with a dietitian. He never (He is wheelchair-bound due to deconditioning, and due todegenerative joint disease.) participates in exercise. His breakfast blood glucose range is generally >200 mg/dl. His lunch blood glucose range is generally 180-200 mg/dl. His dinner blood glucose range is generally 180-200 mg/dl. His bedtime blood glucose range is generally 180-200 mg/dl. His overall blood glucose range is >200 mg/dl. An ACE inhibitor/angiotensin II receptor blocker is being taken. He sees a podiatrist. Hyperlipidemia  This is a chronic problem. The current episode started more than 1 year ago. Exacerbating diseases include diabetes and obesity. Factors aggravating his hyperlipidemia include smoking. Pertinent negatives include no myalgias. Current antihyperlipidemic treatment includes statins. Risk factors for coronary artery disease include dyslipidemia, diabetes mellitus, hypertension, a sedentary lifestyle, male sex and obesity.     Objective:    There were no vitals taken for this visit.  Wt Readings from Last 3 Encounters:  06/13/18 250 lb (113.4 kg)  03/14/18 242 lb (109.8 kg)  12/09/17 242 lb (109.8 kg)      CMP     Component Value Date/Time   NA 138 10/20/2017 1608   K 4.9 10/20/2017 1608   CL 107 10/20/2017 1608   CO2 20 (L) 10/20/2017 1600   GLUCOSE 211 (H) 10/20/2017 1608   BUN 16 09/14/2018   CREATININE 1.5 (A) 09/14/2018   CREATININE 1.50 (H) 10/20/2017 1608   CREATININE 1.29 (H) 04/12/2015 1609  CALCIUM 9.9 10/20/2017 1600   PROT 7.4 10/20/2017 1600   ALBUMIN 3.9 10/20/2017 1600   AST 24  10/20/2017 1600   ALT 24 10/20/2017 1600   ALKPHOS 69 10/20/2017 1600   BILITOT 0.8 10/20/2017 1600   GFRNONAA 43 (L) 10/20/2017 1600   GFRAA 49 (L) 10/20/2017 1600    Lipid Panel     Component Value Date/Time   CHOL 111 12/06/2017   TRIG 423 (A) 12/06/2017   HDL 33 (A) 12/06/2017    A1c on 10 x 6 2019 was 8.2%. A1c on June 09, 2018 was 8.1% A1c was 9.7% on March 05/2019  Assessment & Plan:   1. Type 2 diabetes mellitus with stage 3 chronic kidney disease, without long-term current use of insulin ([place])  - Patient has currently uncontrolled symptomatic type 2 DM since  73 years of age. -His previsit labs show A1c of 9.7%, increasing from 8.1% during his last visit.    -his diabetes is complicated by stage 3 renal insufficiency, obesity/sedentary life, neuropathy, cardiomyopathy, chronic heavy smoking, inadequate insurance and PARAS KREIDER remains at a high risk for more acute and chronic complications which include CAD, CVA, CKD, retinopathy, and neuropathy. These are all discussed in detail with the patient.  - I have counseled him on diet management and weight loss, by adopting a carbohydrate restricted/protein rich diet.  - Patient admits there is a room for improvement in his diet and drink choices. -  Suggestion is made for him to avoid simple carbohydrates  from his diet including Cakes, Sweet Desserts / Pastries, Ice Cream, Soda (diet and regular), Sweet Tea, Candies, Chips, Cookies, Store Bought Juices, Alcohol in Excess of  1-2 drinks a day, Artificial Sweeteners, and "Sugar-free" Products. This will help patient to have stable blood glucose profile and potentially avoid unintended weight gain.  - I encouraged him to switch to  unprocessed or minimally processed complex starch and increased protein intake (animal or plant source), fruits, and vegetables.  - he is advised to stick to a routine mealtimes to eat 3 meals  a day and avoid unnecessary snacks ( to snack only  to correct hypoglycemia).   - I have approached him with the following individualized plan to manage diabetes and patient agrees:   - Based on his glucose profile, he will continue to need intensive treatment with basal/bolus insulin in order for him to achieve and maintain control of diabetes to target. -He is advised to increase his Lantus to 90 units  at 8 PM every day so that he won't miss  his doses.  -He is advised to continue NovoLog 25 units  units 3 times a day before meals for pre-meal blood glucose readings above 90 mg/dL (with additional correction for readings above 150 mg/dL)  associated with strict monitoring of blood glucose 4 times a day- before meals and at bedtime. - He is advised to stay committed for proper monitoring for safe use of insulin.  -Patient is encouraged to call clinic for blood glucose levels less than 70 or above 300 mg /dl. - He is not a candidate for metformin therapy due to CKD.  - He  will be considered for incretin therapy when he gets appropriate insurance coverage.   2) Lipids/HPL: He has significant hypertriglyceridemia.  He is advised to continue simvastatin 40 mg p.o. nightly along with his cholestyramine 4 g 3 times a day.     3) Chronic Care/Health Maintenance:  -he  is on ACEI/ARB and  Statin medications and  is encouraged to continue to follow up with Ophthalmology, Dentist,  Podiatrist at least yearly or according to recommendations. -He is extensively counseled against smoking. I have recommended yearly flu vaccine and pneumonia vaccination at least every 5 years; and  sleep for at least 7 hours a day.  - I advised patient to maintain close follow up with Glenda Chroman, MD for primary care needs.  - Patient Care Time Today:  25 min, of which >50% was spent in reviewing his  current and  previous labs/studies, previous treatments, and medications doses and developing a plan for long-term care based on the latest recommendations for standards of  care.  Dylan Tyler participated in the discussions, expressed understanding, and voiced agreement with the above plans.  All questions were answered to his satisfaction. he is encouraged to contact clinic should he have any questions or concerns prior to his return visit.   Follow up plan: - No follow-ups on file.  Glade Lloyd, MD Phone: [phone]  Fax: [phone]   10/13/2018, 12:13 PM This note was partially dictated with voice recognition software. Similar sounding words can be transcribed inadequately or may not  be corrected upon review.

## 2018-10-17 ENCOUNTER — Ambulatory Visit: Payer: PPO | Admitting: "Endocrinology

## 2018-10-25 ENCOUNTER — Other Ambulatory Visit: Payer: Self-pay

## 2018-10-26 DIAGNOSIS — I2510 Atherosclerotic heart disease of native coronary artery without angina pectoris: Secondary | ICD-10-CM | POA: Diagnosis not present

## 2018-10-26 DIAGNOSIS — J209 Acute bronchitis, unspecified: Secondary | ICD-10-CM | POA: Diagnosis not present

## 2018-10-26 DIAGNOSIS — Z6837 Body mass index (BMI) 37.0-37.9, adult: Secondary | ICD-10-CM | POA: Diagnosis not present

## 2018-10-26 DIAGNOSIS — J440 Chronic obstructive pulmonary disease with acute lower respiratory infection: Secondary | ICD-10-CM | POA: Diagnosis not present

## 2018-10-26 DIAGNOSIS — Z299 Encounter for prophylactic measures, unspecified: Secondary | ICD-10-CM | POA: Diagnosis not present

## 2018-10-26 DIAGNOSIS — B370 Candidal stomatitis: Secondary | ICD-10-CM | POA: Diagnosis not present

## 2018-10-27 MED ORDER — FREESTYLE LIBRE 14 DAY SENSOR MISC: 1.0000 | 2 refills | Status: DC

## 2018-10-27 MED ORDER — FREESTYLE LIBRE 14 DAY READER DEVI: 1.0000 | 0 refills | Status: DC

## 2018-10-31 ENCOUNTER — Other Ambulatory Visit: Payer: Self-pay

## 2018-10-31 MED ORDER — FREESTYLE LIBRE 14 DAY SENSOR MISC: 1.0000 | 2 refills | Status: DC

## 2018-10-31 MED ORDER — FREESTYLE LIBRE 14 DAY READER DEVI: 1.0000 | 0 refills | Status: DC

## 2018-11-03 ENCOUNTER — Telehealth: Payer: Self-pay

## 2018-11-03 ENCOUNTER — Other Ambulatory Visit: Payer: Self-pay

## 2018-11-03 ENCOUNTER — Ambulatory Visit (INDEPENDENT_AMBULATORY_CARE_PROVIDER_SITE_OTHER): Payer: PPO | Admitting: *Deleted

## 2018-11-03 DIAGNOSIS — R001 Bradycardia, unspecified: Secondary | ICD-10-CM

## 2018-11-03 NOTE — Telephone Encounter (Signed)
Left message regarding appt on 11/04/18.

## 2018-11-04 ENCOUNTER — Other Ambulatory Visit: Payer: Self-pay

## 2018-11-04 ENCOUNTER — Ambulatory Visit (INDEPENDENT_AMBULATORY_CARE_PROVIDER_SITE_OTHER): Payer: PPO | Admitting: Internal Medicine

## 2018-11-04 DIAGNOSIS — I10 Essential (primary) hypertension: Secondary | ICD-10-CM

## 2018-11-04 DIAGNOSIS — I4819 Other persistent atrial fibrillation: Secondary | ICD-10-CM | POA: Diagnosis not present

## 2018-11-04 DIAGNOSIS — R001 Bradycardia, unspecified: Secondary | ICD-10-CM | POA: Diagnosis not present

## 2018-11-04 DIAGNOSIS — I459 Conduction disorder, unspecified: Secondary | ICD-10-CM | POA: Diagnosis not present

## 2018-11-04 NOTE — Progress Notes (Signed)
Electrophysiology TeleHealth Note  Due to national recommendations of social distancing due to COVID 19, an audio/video telehealth visit is felt to be most appropriate for this patient at this time.  Verbal consent was obtained from the patient today.  We used doximity for this visit today.   Date:  11/04/2018   ID:  Dylan Tyler, DOB 11-24-1945, MRN [id number]  Location: patient's home  Provider location: [facility]  Evaluation Performed: Follow-up visit  PCP:  Glenda Chroman, MD  Cardiologist:  Rozann Lesches, MD  Electrophysiologist:  Dr [name]  Chief Complaint:  HTN and afib  History of Present Illness:    Dylan Tyler is a 73 y.o. male who presents via audio/video conferencing for a telehealth visit today.  Since last being seen in our clinic, the patient reports doing very well.  SOB is due to COPD and only " a little worse" than last year.  Today, he denies symptoms of palpitations, chest pain,  lower extremity edema, dizziness, presyncope, or syncope.  The patient is otherwise without complaint today.  The patient denies symptoms of fevers, chills, cough, or new SOB worrisome for COVID 19.  Past Medical History:  Diagnosis Date  . Ankylosing spondylitis ([place])   . Anxiety   . Aortic root dilatation (H[facility])    Mild  . AR (aortic regurgitation)    Mild  . Atrial fibrillation and flutter ([place])   . Bladder cancer ([place])   . Chronic pain    [facility]ed at [facility]  . DDD (degenerative disc disease), lumbosacral   . DDD (degenerative disc disease), thoracolumbar   . Degenerative cervical disc   . Depression   . Diabetes mellitus type II   . Diverticulitis   . Essential hypertension, benign   . GERD (gastroesophageal reflux disease)   . Hyperlipidemia   . Kidney stones   . Migraine   . Nonischemic cardiomyopathy ([place])    Cardiolite 12/09 LVEF 55%, minor luminal irregularities at cardiac catheterization  . OSA on CPAP   . Rheumatoid arthritis ([place])   . Symptomatic  bradycardia    Medtronic Advisa L dual-chamber pacemaker  05/2015 - Dr. Curt Bears    Past Surgical History:  Procedure Laterality Date  . CARDIAC CATHETERIZATION  08/13/11  . CARDIOVERSION N/A 07/02/2015   Procedure: CARDIOVERSION;  Surgeon: Sanda Klein, MD;  Location: [facility];  Service: Cardiovascular;  Laterality: N/A;  . CYSTOSCOPY WITH HOLMIUM LASER LITHOTRIPSY  1990's  . EP IMPLANTABLE DEVICE N/A 05/21/2015   MDT Advisa DR MRI pacemaker implanted by Dr Curt Bears  . KNEE ARTHROSCOPY Right ~ 1991  . LAPAROSCOPIC CHOLECYSTECTOMY  ~ 2008  . TRANSURETHRAL RESECTION OF BLADDER TUMOR WITH GYRUS (TURBT-GYRUS)  1990's?    Current Outpatient Medications  Medication Sig Dispense Refill  . acetaminophen (TYLENOL) 500 MG tablet Take 500 mg by mouth every 6 (six) hours as needed.    Marland Kitchen acyclovir (ZOVIRAX) 400 MG tablet Take 2 tablets (800 mg total) by mouth 5 (five) times daily. 50 tablet 0  . albuterol (PROVENTIL) (2.5 MG/3ML) 0.083% nebulizer solution Take 2.5 mg by nebulization every 6 (six) hours as needed for wheezing.    . Alcohol Swabs 70 % PADS Use as directed 120 each 2  . ALPRAZolam (XANAX) 0.5 MG tablet Take 0.5 mg by mouth 2 (two) times daily as needed.  1  . apixaban (ELIQUIS) 5 MG TABS tablet Take 1 tablet (5 mg total) by mouth 2 (two) times daily. 42 tablet 0  .  BD PEN NEEDLE NANO U/F 32G X 4 MM MISC USE ONE AS DIRECTED - 4 injections per day 150 each 5  . carvedilol (COREG) 6.25 MG tablet Take 1 tablet (6.25 mg total) by mouth 2 (two) times daily with a meal. 60 tablet 0  . Cholecalciferol (VITAMIN D-3) 1000 UNITS CAPS Take 1,000 Units by mouth 2 (two) times daily.     . cholestyramine (QUESTRAN) 4 g packet Take 4 g by mouth 3 (three) times daily with meals.    . citalopram (CELEXA) 40 MG tablet Take 20-40 mg by mouth daily.     . Continuous Blood Gluc Receiver (FREESTYLE LIBRE 14 DAY READER) DEVI 1 each by Does not apply route every 14 (fourteen) days. 1 Device 0  . Continuous  Blood Gluc Sensor (FREESTYLE LIBRE 14 DAY SENSOR) MISC 1 each by Does not apply route every 14 (fourteen) days. 6 each 2  . doxycycline (VIBRA-TABS) 100 MG tablet Take 100 mg by mouth 2 (two) times daily.  0  . ELIQUIS 5 MG TABS tablet TAKE ONE TABLET BY MOUTH TWICE DAILY 60 tablet 2  . FREESTYLE LITE test strip USE TO CHECK BLOOD SUGAR FOUR TIMES DAILY. 150 each 5  . gabapentin (NEURONTIN) 400 MG capsule Take 400 mg by mouth 3 (three) times daily.     Marland Kitchen ibuprofen (ADVIL,MOTRIN) 200 MG tablet Take 400 mg by mouth every 6 (six) hours as needed for headache or moderate pain.    . Insulin Glargine (LANTUS SOLOSTAR) 100 UNIT/ML Solostar Pen INJECT 90 UNITS INTO SKIN DAILY AT 10PM. 30 mL 4  . Insulin Pen Needle (B-D ULTRAFINE III SHORT PEN) 31G X 8 MM MISC Use to inject insulin 4 x daily 200 each 3  . ipratropium-albuterol (DUONEB) 0.5-2.5 (3) MG/3ML SOLN Take 3 mLs by nebulization every 4 (four) hours as needed (for shortness of breath).    . Lancets (FREESTYLE) lancets USE TO CHECK BLOOD SUGAR FOUR TIMES DAILY. 200 each 5  . metoCLOPramide (REGLAN) 5 MG tablet Take 5 mg by mouth every 6 (six) hours as needed for nausea or vomiting.     . nitroGLYCERIN (NITROSTAT) 0.4 MG SL tablet DISSOLVE ONE TABLET UNDER TONGUE EVERY 5 MINUTES UP TO 3 DOSES AS NEEDED FOR CHEST PAIN. IF no RELIEF AFTER 3 doses, proceed TO THE ER 25 tablet 3  . NON FORMULARY 1 each by Other route See admin instructions. CPAP - Uses nightly    . NOVOLOG FLEXPEN 100 UNIT/ML FlexPen INJECT 25-31 UNITS SUBCUTANEOUSLY THREE TIMES DAILY AS DIRECTED. TAKE WITH MEALS. 30 mL 2  . simvastatin (ZOCOR) 40 MG tablet TAKE ONE TABLET BY MOUTH AT BEDTIME 90 tablet 0  . vitamin B-12 (CYANOCOBALAMIN) 1000 MCG tablet Take 1,000 mcg by mouth 2 (two) times daily.      . VOLTAREN 1 % GEL Apply 2 g topically 4 (four) times daily as needed (for pain).      No current facility-administered medications for this visit.     Allergies:   Patient has no known  allergies.   Social History:  The patient  reports that he has been smoking e-cigarettes. He started smoking about 59 years ago. He has a 15.00 pack-year smoking history. He has quit using smokeless tobacco.  His smokeless tobacco use included chew. He reports current alcohol use of about 2.0 standard drinks of alcohol per week. He reports current drug use. Drug: Cocaine.   Family History:  The patient's family history includes Cancer in his father;  Heart attack in his mother.   ROS:  Please see the history of present illness.   All other systems are personally reviewed and negative.    Exam:    Vital Signs:  There were no vitals taken for this visit.  Well appearing, alert and conversant, regular work of breathing,  good skin color Eyes- anicteric, neuro- grossly intact, skin- no apparent rash or lesions or cyanosis, mouth- oral mucosa is pink   Labs/Other Tests and Data Reviewed:    Recent Labs: 09/14/2018: BUN 16; Creatinine 1.5   Wt Readings from Last 3 Encounters:  06/13/18 250 lb (113.4 kg)  03/14/18 242 lb (109.8 kg)  12/09/17 242 lb (109.8 kg)     Other studies personally reviewed: Additional studies/ records that were reviewed today include: my prior office notes  Review of the above records today demonstrates: as above   Last device remote is reviewed from [facility] PDF dated 08/05/2018 which reveals normal device function, permanent afib, rate controlled   ASSESSMENT & PLAN:    1.  Permanent afib Rate controlled On eliquis  2. Complete heart block Pacemaker remotes are uptodate Last remote reveals normal device function  3. HTN Stable No change required today  4. Obesity Stable No change required today  5. COVID 19 screen The patient denies symptoms of COVID 19 at this time.  The importance of social distancing was discussed today.  6. Tobacco Cessation advised (he still vapes)  Follow-up:  12 months with me in [place] Next remote: 11/2018   Patient  Risk:  after full review of this patients clinical status, I feel that they are at moderate risk at this time.  Today, I have spent 15 minutes with the patient with telehealth technology discussing COPD and afib .    Army Fossa, MD  11/04/2018 12:23 PM     [facility] [address] [place] [address] [phone] (office) [phone] (fax)

## 2018-11-06 LAB — CUP PACEART REMOTE DEVICE CHECK
Battery Remaining Longevity: 38 mo
Battery Voltage: 2.98 V
Brady Statistic AP VP Percent: 0 %
Brady Statistic AP VS Percent: 0 %
Brady Statistic AS VP Percent: 96.23 %
Brady Statistic AS VS Percent: 3.77 %
Brady Statistic RA Percent Paced: 0 %
Brady Statistic RV Percent Paced: 97.13 %
Date Time Interrogation Session: 20200501153202
Implantable Lead Implant Date: 20161115
Implantable Lead Implant Date: 20161115
Implantable Lead Location: 753859
Implantable Lead Location: 753860
Implantable Lead Model: 5076
Implantable Lead Model: 5076
Implantable Pulse Generator Implant Date: 20161115
Lead Channel Impedance Value: 380 Ohm
Lead Channel Impedance Value: 399 Ohm
Lead Channel Impedance Value: 494 Ohm
Lead Channel Impedance Value: 551 Ohm
Lead Channel Pacing Threshold Amplitude: 0.5 V
Lead Channel Pacing Threshold Amplitude: 0.625 V
Lead Channel Pacing Threshold Pulse Width: 0.4 ms
Lead Channel Pacing Threshold Pulse Width: 0.4 ms
Lead Channel Sensing Intrinsic Amplitude: 1.5 mV
Lead Channel Sensing Intrinsic Amplitude: 1.5 mV
Lead Channel Sensing Intrinsic Amplitude: 10.75 mV
Lead Channel Sensing Intrinsic Amplitude: 10.75 mV
Lead Channel Setting Pacing Amplitude: 2.5 V
Lead Channel Setting Pacing Pulse Width: 0.4 ms
Lead Channel Setting Sensing Sensitivity: 2.8 mV

## 2018-11-08 ENCOUNTER — Other Ambulatory Visit: Payer: Self-pay | Admitting: Cardiology

## 2018-11-10 ENCOUNTER — Encounter: Payer: Self-pay | Admitting: Cardiology

## 2018-11-10 NOTE — Progress Notes (Signed)
Remote pacemaker transmission.   

## 2018-11-11 DIAGNOSIS — I10 Essential (primary) hypertension: Secondary | ICD-10-CM | POA: Diagnosis not present

## 2018-11-11 DIAGNOSIS — Z713 Dietary counseling and surveillance: Secondary | ICD-10-CM | POA: Diagnosis not present

## 2018-11-11 DIAGNOSIS — E1165 Type 2 diabetes mellitus with hyperglycemia: Secondary | ICD-10-CM | POA: Diagnosis not present

## 2018-11-11 DIAGNOSIS — Z299 Encounter for prophylactic measures, unspecified: Secondary | ICD-10-CM | POA: Diagnosis not present

## 2018-11-15 ENCOUNTER — Telehealth: Payer: Self-pay | Admitting: "Endocrinology

## 2018-11-15 MED ORDER — FREESTYLE LIBRE 14 DAY SENSOR MISC: 1.0000 | 2 refills | Status: DC

## 2018-11-15 NOTE — Telephone Encounter (Signed)
[place] [employer] officer, museum wants you to send a pump order to [facility]. If questions call [phone]

## 2018-11-15 NOTE — Telephone Encounter (Signed)
Rx sent to [facility]

## 2018-11-29 ENCOUNTER — Other Ambulatory Visit: Payer: Self-pay | Admitting: "Endocrinology

## 2018-11-30 ENCOUNTER — Other Ambulatory Visit: Payer: Self-pay

## 2018-11-30 MED ORDER — FREESTYLE LIBRE 14 DAY SENSOR MISC: 1.0000 | 2 refills | Status: DC

## 2018-11-30 MED ORDER — FREESTYLE LIBRE 14 DAY READER DEVI: 1.0000 | 0 refills | Status: DC

## 2018-11-30 NOTE — Telephone Encounter (Signed)
Freestyle Dylan Tyler has been sent to [facility]. This is the second time this has been sent to [place]. The actual prescription has been sent 5 times to different pharmacies as advised by pt.

## 2018-12-02 DIAGNOSIS — I10 Essential (primary) hypertension: Secondary | ICD-10-CM | POA: Diagnosis not present

## 2018-12-02 DIAGNOSIS — Z6841 Body Mass Index (BMI) 40.0 and over, adult: Secondary | ICD-10-CM | POA: Diagnosis not present

## 2018-12-02 DIAGNOSIS — Z299 Encounter for prophylactic measures, unspecified: Secondary | ICD-10-CM | POA: Diagnosis not present

## 2018-12-02 DIAGNOSIS — R609 Edema, unspecified: Secondary | ICD-10-CM | POA: Diagnosis not present

## 2018-12-02 DIAGNOSIS — M459 Ankylosing spondylitis of unspecified sites in spine: Secondary | ICD-10-CM | POA: Diagnosis not present

## 2018-12-02 DIAGNOSIS — R51 Headache: Secondary | ICD-10-CM | POA: Diagnosis not present

## 2018-12-09 ENCOUNTER — Other Ambulatory Visit: Payer: Self-pay | Admitting: Cardiology

## 2018-12-12 DIAGNOSIS — R600 Localized edema: Secondary | ICD-10-CM | POA: Diagnosis not present

## 2018-12-15 ENCOUNTER — Telehealth: Payer: Self-pay | Admitting: *Deleted

## 2018-12-15 ENCOUNTER — Telehealth: Payer: Self-pay | Admitting: Cardiology

## 2018-12-15 DIAGNOSIS — I10 Essential (primary) hypertension: Secondary | ICD-10-CM

## 2018-12-15 DIAGNOSIS — I429 Cardiomyopathy, unspecified: Secondary | ICD-10-CM

## 2018-12-15 MED ORDER — POTASSIUM CHLORIDE ER 10 MEQ PO TBCR: 10.0000 meq | EXTENDED_RELEASE_TABLET | Freq: Every day | ORAL | 1 refills | Status: DC

## 2018-12-15 MED ORDER — FUROSEMIDE 20 MG PO TABS: 20.0000 mg | ORAL_TABLET | Freq: Every day | ORAL | 1 refills | Status: DC

## 2018-12-15 NOTE — Telephone Encounter (Signed)
Lab order for BMET faxed to [facility] and will have it done on 01/02/2019

## 2018-12-15 NOTE — Telephone Encounter (Signed)
-----   Message from [name], MD sent at 12/15/2018 10:28 AM EDT ----- Results reviewed.  Echo done at [facility] recently indicated LVEF 30 to 40% range which is a decrease compared to prior study through our practice.  Please schedule an office follow-up visit in [place] within the next few months, he will need a BMET for that visit.

## 2018-12-15 NOTE — Telephone Encounter (Signed)
Pt is retaining fluid in his feet and legs per phone call from wife Patty.   Echo report was scanned in today.

## 2018-12-15 NOTE — Telephone Encounter (Signed)
Have him stop chlorthalidone and start Lasix 20 mg daily with KCl 10 mEq daily.  His echocardiogram done at [facility] does show reduction in LVEF compared to prior assessment, he is likely retaining fluid due to this and will require further medication adjustments.

## 2018-12-15 NOTE — Telephone Encounter (Signed)

## 2018-12-15 NOTE — Telephone Encounter (Signed)
Chlorthalidone started by PCP on 12/02/2018 for swelling in legs and feet. Says swelling in feet is better after sleeping and after moving around, swelling in legs and feet increases. Says that swelling is not any better since starting chlorthalidone. Reports weight gain. Today weighs 257 lbs. Reports been lightheaded one day last week while outside in the heat. Denies worsening symptoms of dizziness, chest pain or sob. Medications reconciled. Appointment scheduled for 01/04/2019 @2 :00 pm Home office. Advised if he develops worsening symptoms to go to the ED for an evaluation.

## 2018-12-15 NOTE — Telephone Encounter (Signed)
Patient and significant other informed and verbalized understanding of plan.

## 2018-12-20 ENCOUNTER — Ambulatory Visit: Payer: PPO | Admitting: Cardiology

## 2018-12-20 DIAGNOSIS — M25512 Pain in left shoulder: Secondary | ICD-10-CM | POA: Diagnosis not present

## 2018-12-20 DIAGNOSIS — M1711 Unilateral primary osteoarthritis, right knee: Secondary | ICD-10-CM | POA: Diagnosis not present

## 2018-12-21 NOTE — Progress Notes (Signed)
Virtual Visit via Telephone Note   This visit type was conducted due to national recommendations for restrictions regarding the COVID-19 Pandemic (e.g. social distancing) in an effort to limit this patient's exposure and mitigate transmission in our community.  Due to his co-morbid illnesses, this patient is at least at moderate risk for complications without adequate follow up.  This format is felt to be most appropriate for this patient at this time.  The patient did not have access to video technology/had technical difficulties with video requiring transitioning to audio format only (telephone).  All issues noted in this document were discussed and addressed.  No physical exam could be performed with this format.  Please refer to the patient's chart for his  consent to telehealth for [facility].   Date:  12/22/2018   ID:  Dylan Tyler, DOB [birth year], MRN [id number]  Patient Location: Home Provider Location: Home  PCP:  Glenda Chroman, MD  Cardiologist:  Rozann Lesches, MD Electrophysiologist:  Thompson Grayer, MD   Evaluation Performed:  Follow-Up Visit  Chief Complaint:   Cardiac follow-up  History of Present Illness:    Dylan Tyler is a 73 y.o. male that I last saw in May 2019.  We had difficulty with video access today and spoke by phone.  Recent telephone notes reviewed, patient had been experiencing leg swelling and was started on chlorthalidone by his PCP.  We ultimately switched this to Lasix with potassium supplements. He had a recent echocardiogram done at [facility] demonstrating LVEF 30 to 40% range which has decreased in comparison to previous assessment.  He states that his leg swelling has not yet begun to resolve.  He has gained approximately 10 or 15 pounds as best he can tell over a few months.  Does not report any angina symptoms, no palpitations or syncope.  He admits that he has not been very active in the last few months.  Home blood pressures  have been elevated with systolics in the 782N to 562Z.  He sees Dr. [name] in the device clinic, Medtronic pacemaker in place.  I reviewed the recent note from May.  The patient does not have symptoms concerning for COVID-19 infection (fever, chills, cough, or new shortness of breath).    Past Medical History:  Diagnosis Date  . Ankylosing spondylitis ([place])   . Anxiety   . Aortic root dilatation ([facility])    Mild  . AR (aortic regurgitation)    Mild  . Atrial fibrillation and flutter ([place])   . Bladder cancer ([place])   . Chronic pain    Followed at [facility]  . DDD (degenerative disc disease), lumbosacral   . DDD (degenerative disc disease), thoracolumbar   . Degenerative cervical disc   . Depression   . Diabetes mellitus type II   . Diverticulitis   . Essential hypertension, benign   . GERD (gastroesophageal reflux disease)   . Hyperlipidemia   . Kidney stones   . Migraine   . Nonischemic cardiomyopathy ([place])    Cardiolite 12/09 LVEF 55%, minor luminal irregularities at cardiac catheterization  . OSA on CPAP   . Rheumatoid arthritis ([place])   . Symptomatic bradycardia    Medtronic Advisa L dual-chamber pacemaker  05/2015 - Dr. Curt Bears   Past Surgical History:  Procedure Laterality Date  . CARDIAC CATHETERIZATION  08/13/11  . CARDIOVERSION N/A 07/02/2015   Procedure: CARDIOVERSION;  Surgeon: Sanda Klein, MD;  Location: [facility] END[facility]COPY;  Service: Cardiovascular;  Laterality: N/A;  .  CYSTOSCOPY WITH HOLMIUM LASER LITHOTRIPSY  1990's  . EP IMPLANTABLE DEVICE N/A 05/21/2015   MDT Advisa DR MRI pacemaker implanted by Dr Curt Bears  . KNEE ARTHROSCOPY Right ~ 1991  . LAPAROSCOPIC CHOLECYSTECTOMY  ~ 2008  . TRANSURETHRAL RESECTION OF BLADDER TUMOR WITH GYRUS (TURBT-GYRUS)  1990's?     Current Meds  Medication Sig  . acetaminophen (TYLENOL) 500 MG tablet Take 500 mg by mouth every 6 (six) hours as needed.  Marland Kitchen albuterol (PROVENTIL) (2.5 MG/3ML) 0.083% nebulizer solution Take 2.5 mg by  nebulization every 6 (six) hours as needed for wheezing.  . Alcohol Swabs 70 % PADS Use as directed  . ALPRAZolam (XANAX) 0.5 MG tablet Take 0.5 mg by mouth 2 (two) times daily as needed.  . BD PEN NEEDLE NANO U/F 32G X 4 MM MISC USE ONE AS DIRECTED - 4 injections per day  . carvedilol (COREG) 6.25 MG tablet TAKE ONE TABLET BY MOUTH TWICE DAILY. TAKE WITH A MEAL.  Marland Kitchen Cholecalciferol (VITAMIN D-3) 1000 UNITS CAPS Take 1,000 Units by mouth 2 (two) times daily.   . cholestyramine (QUESTRAN) 4 g packet Take 4 g by mouth 3 (three) times daily with meals.  . citalopram (CELEXA) 40 MG tablet Take 20-40 mg by mouth daily.   . Continuous Blood Gluc Receiver (FREESTYLE LIBRE 14 DAY READER) DEVI 1 each by Does not apply route every 14 (fourteen) days.  . Continuous Blood Gluc Sensor (FREESTYLE LIBRE 14 DAY SENSOR) MISC 1 each by Does not apply route every 14 (fourteen) days.  Marland Kitchen ELIQUIS 5 MG TABS tablet TAKE ONE TABLET BY MOUTH TWICE DAILY  . FREESTYLE LITE test strip USE TO CHECK BLOOD SUGAR FOUR TIMES DAILY.  . furosemide (LASIX) 20 MG tablet Take 1 tablet (20 mg total) by mouth daily.  Marland Kitchen gabapentin (NEURONTIN) 400 MG capsule Take 400 mg by mouth 3 (three) times daily.   Marland Kitchen ibuprofen (ADVIL,MOTRIN) 200 MG tablet Take 400 mg by mouth every 6 (six) hours as needed for headache or moderate pain.  . Insulin Glargine (LANTUS SOLOSTAR) 100 UNIT/ML Solostar Pen INJECT 90 UNITS INTO SKIN DAILY AT 10PM.  . Insulin Pen Needle (B-D ULTRAFINE III SHORT PEN) 31G X 8 MM MISC Use to inject insulin 4 x daily  . ipratropium-albuterol (DUONEB) 0.5-2.5 (3) MG/3ML SOLN Take 3 mLs by nebulization every 4 (four) hours as needed (for shortness of breath).  . Lancets (FREESTYLE) lancets USE TO CHECK BLOOD SUGAR FOUR TIMES DAILY.  Marland Kitchen metoCLOPramide (REGLAN) 5 MG tablet Take 5 mg by mouth every 6 (six) hours as needed for nausea or vomiting.   . nitroGLYCERIN (NITROSTAT) 0.4 MG SL tablet DISSOLVE ONE TABLET UNDER TONGUE EVERY 5  MINUTES UP TO 3 DOSES AS NEEDED FOR CHEST PAIN. IF no RELIEF AFTER 3 doses, proceed TO THE ER  . NON FORMULARY 1 each by Other route See admin instructions. CPAP - Uses nightly  . NOVOLOG FLEXPEN 100 UNIT/ML FlexPen INJECT 25-31 UNITS SUBCUTANEOUSLY THREE TIMES DAILY AS DIRECTED. TAKE WITH MEALS.  Marland Kitchen potassium chloride (K-DUR) 10 MEQ tablet Take 1 tablet (10 mEq total) by mouth daily.  . simvastatin (ZOCOR) 40 MG tablet TAKE ONE TABLET BY MOUTH AT BEDTIME  . traMADol (ULTRAM) 50 MG tablet Take 1 tablet by mouth daily as needed.  . TRELEGY ELLIPTA 100-62.5-25 MCG/INH AEPB Inhale 1 puff into the lungs daily.  . vitamin B-12 (CYANOCOBALAMIN) 1000 MCG tablet Take 1,000 mcg by mouth 2 (two) times daily.    . VOLTAREN  1 % GEL Apply 2 g topically 4 (four) times daily as needed (for pain).      Allergies:   Patient has no known allergies.   Social History   Tobacco Use  . Smoking status: Current Every Day Smoker    Packs/day: 0.30    Years: 50.00    Pack years: 15.00    Types: E-cigarettes    Start date: 02/26/1959    Last attempt to quit: 12/06/2013    Years since quitting: 5.0  . Smokeless tobacco: Former Systems developer    Types: Chew  . Tobacco comment: 05/21/2015 "quit chewing in 1992"  Substance Use Topics  . Alcohol use: Yes    Alcohol/week: 2.0 standard drinks    Types: 2 Cans of beer per week  . Drug use: Yes    Types: Cocaine    Comment: 05/21/2015 "quit in the early 2000"     Family Hx: The patient's family history includes Cancer in his father; Heart attack in his mother.  ROS:   Please see the history of present illness. All other systems reviewed and are negative.   Prior CV studies:   The following studies were reviewed today:  Echocardiogram 12/12/2018 [facility]): Mild to moderate LVH with LVEF approximately 30 to 40% with global hypokinesis, mildly dilated right ventricle with device wire present, moderate left atrial enlargement, mild aortic regurgitation, mild  mitral regurgitation, mild tricuspid regurgitation, RVSP estimated 33 mmHg.  Labs/Other Tests and Data Reviewed:    EKG:  An ECG dated 10/20/2017 was personally reviewed today and demonstrated:  Ventricular paced rhythm with underlying atrial fibrillation.  Recent Labs: 09/14/2018: BUN 16; Creatinine 1.5   Recent Lipid Panel Lab Results  Component Value Date/Time   CHOL 111 12/06/2017   TRIG 423 (A) 12/06/2017   HDL 33 (A) 12/06/2017    Wt Readings from Last 3 Encounters:  12/22/18 250 lb (113.4 kg)  06/13/18 250 lb (113.4 kg)  03/14/18 242 lb (109.8 kg)     Objective:    Vital Signs:  BP (!) 164/90   Ht 5\' 9"  (1.753 m)   Wt 250 lb (113.4 kg)   BMI 36.92 kg/m    Patient spoke in full sentences, not short of breath. No wheezing or coughing audible. Speech pattern normal.  ASSESSMENT & PLAN:    1.  Nonischemic cardiomyopathy, recent LVEF documented in the range of 30 to 40% by echocardiogram at [facility].  This represents a decrease compared to last evaluation.  Plan to continue Coreg and recent addition of Lasix and potassium supplements, also start Entresto 24/26 mg twice daily with follow-up BMET in 7-10 days.  Hopefully he will be able to tolerate this with known CKD.  Would not add Aldactone at this point.  2.  Acute on chronic combined heart failure, patient reports leg swelling and weight gain.  Lasix was added recently, also starting Entresto which may have additional diuretic effect.  He will need an office follow-up for further evaluation.  3.  Symptomatic bradycardia status post Medtronic pacemaker.  He follows with Dr. [name].  Recent device interrogation indicated 97% RV pacing, question whether this could be related to his cardiomyopathy as well.  4.  Essential hypertension, blood pressure is not optimally controlled.  Medications are being adjusted.  COVID-19 Education: The signs and symptoms of COVID-19 were discussed with the patient and how to  seek care for testing (follow up with PCP or arrange E-visit).  The importance of social distancing  was discussed today.  Time:   Today, I have spent 8 minutes with the patient with telehealth technology discussing the above problems.     Medication Adjustments/Labs and Tests Ordered: Current medicines are reviewed at length with the patient today.  Concerns regarding medicines are outlined above.   Tests Ordered: Orders Placed This Encounter  Procedures  . Basic Metabolic Panel (BMET)    Medication Changes: Meds ordered this encounter  Medications  . sacubitril-valsartan (ENTRESTO) 24-26 MG    Sig: Take 1 tablet by mouth 2 (two) times daily.    Dispense:  60 tablet    Refill:  6    Please Honor Card patient is presenting for Carmie Kanner: [phone]; Juanna Cao: [phone]; KCCQF: [id number]; ISSUER: [id number] OY:[id number]    Follow Up:  In Person 4 weeks in the [place] office.  Signed, Rozann Lesches, MD  12/22/2018 11:40 AM    [place]

## 2018-12-22 ENCOUNTER — Telehealth (INDEPENDENT_AMBULATORY_CARE_PROVIDER_SITE_OTHER): Payer: PPO | Admitting: Cardiology

## 2018-12-22 ENCOUNTER — Encounter: Payer: Self-pay | Admitting: Cardiology

## 2018-12-22 ENCOUNTER — Other Ambulatory Visit: Payer: Self-pay

## 2018-12-22 VITALS — BP 164/90 | Ht 69.0 in | Wt 250.0 lb

## 2018-12-22 DIAGNOSIS — I429 Cardiomyopathy, unspecified: Secondary | ICD-10-CM | POA: Diagnosis not present

## 2018-12-22 DIAGNOSIS — Z7189 Other specified counseling: Secondary | ICD-10-CM | POA: Diagnosis not present

## 2018-12-22 DIAGNOSIS — Z950 Presence of cardiac pacemaker: Secondary | ICD-10-CM

## 2018-12-22 DIAGNOSIS — I4821 Permanent atrial fibrillation: Secondary | ICD-10-CM | POA: Diagnosis not present

## 2018-12-22 DIAGNOSIS — I10 Essential (primary) hypertension: Secondary | ICD-10-CM

## 2018-12-22 DIAGNOSIS — I5043 Acute on chronic combined systolic (congestive) and diastolic (congestive) heart failure: Secondary | ICD-10-CM | POA: Diagnosis not present

## 2018-12-22 MED ORDER — ENTRESTO 24-26 MG PO TABS: 1.0000 | ORAL_TABLET | Freq: Two times a day (BID) | ORAL | 6 refills | Status: DC

## 2018-12-22 NOTE — Patient Instructions (Signed)
Medication Instructions: START Entresto 24/26 mg twice a day  Labwork:  BMET in 7-10 days  Procedures/Testing: none  Follow-Up: 4 weeks with Bernerd Pho PA-C  Any Additional Special Instructions Will Be Listed Below (If Applicable).     If you need a refill on your cardiac medications before your next appointment, please call your pharmacy.     Thank you for choosing [facility] !

## 2019-01-02 ENCOUNTER — Telehealth: Payer: Self-pay | Admitting: *Deleted

## 2019-01-02 DIAGNOSIS — I429 Cardiomyopathy, unspecified: Secondary | ICD-10-CM | POA: Diagnosis not present

## 2019-01-02 DIAGNOSIS — I5043 Acute on chronic combined systolic (congestive) and diastolic (congestive) heart failure: Secondary | ICD-10-CM | POA: Diagnosis not present

## 2019-01-02 NOTE — Telephone Encounter (Signed)
-----   Message from [name], MD sent at 01/02/2019  2:16 PM EDT ----- Results reviewed.  Overall stable findings with creatinine 1.52 and normal potassium.  Continue with current medications and follow-up plan.

## 2019-01-03 DIAGNOSIS — H524 Presbyopia: Secondary | ICD-10-CM | POA: Diagnosis not present

## 2019-01-03 NOTE — Telephone Encounter (Signed)
Patient informed. Copy sent to PCP °

## 2019-01-04 ENCOUNTER — Ambulatory Visit: Payer: PPO | Admitting: Cardiology

## 2019-01-05 ENCOUNTER — Other Ambulatory Visit: Payer: Self-pay | Admitting: "Endocrinology

## 2019-01-12 DIAGNOSIS — H25813 Combined forms of age-related cataract, bilateral: Secondary | ICD-10-CM | POA: Diagnosis not present

## 2019-01-19 ENCOUNTER — Encounter: Payer: Self-pay | Admitting: Student

## 2019-01-19 ENCOUNTER — Other Ambulatory Visit: Payer: Self-pay

## 2019-01-19 ENCOUNTER — Ambulatory Visit (INDEPENDENT_AMBULATORY_CARE_PROVIDER_SITE_OTHER): Payer: PPO | Admitting: Student

## 2019-01-19 VITALS — BP 132/78 | HR 74 | Temp 97.9°F | Ht 69.0 in | Wt 256.0 lb

## 2019-01-19 DIAGNOSIS — I10 Essential (primary) hypertension: Secondary | ICD-10-CM

## 2019-01-19 DIAGNOSIS — I4821 Permanent atrial fibrillation: Secondary | ICD-10-CM

## 2019-01-19 DIAGNOSIS — I5043 Acute on chronic combined systolic (congestive) and diastolic (congestive) heart failure: Secondary | ICD-10-CM

## 2019-01-19 DIAGNOSIS — R001 Bradycardia, unspecified: Secondary | ICD-10-CM | POA: Diagnosis not present

## 2019-01-19 DIAGNOSIS — E785 Hyperlipidemia, unspecified: Secondary | ICD-10-CM

## 2019-01-19 MED ORDER — FUROSEMIDE 40 MG PO TABS: 40.0000 mg | ORAL_TABLET | Freq: Every day | ORAL | 1 refills | Status: DC

## 2019-01-19 NOTE — Progress Notes (Signed)
Cardiology Office Note    Date:  01/19/2019   ID:  Dylan Tyler 1945-08-23, MRN [id number]  PCP:  Glenda Chroman, MD  Cardiologist: Rozann Lesches, MD    Chief Complaint  Patient presents with   Follow-up    1 month visit    History of Present Illness:    Dylan Tyler is a 73 y.o. male with past medical history of chronic combined systolic and diastolic CHF, normal cors by cath in 2013, symptomatic bradycardia (s/p PPM placement), PAF (on Eliquis for anticoagulation), HTN, and HLD who presents to the office today for 1 month follow-up.  He most recently had a telehealth visit with Dr. Domenic Polite on 12/22/2018 after a recent echocardiogram had shown his EF was reduced to 30%. He reported a weight gain of approximately 10 to 15 pounds but denied any chest pain or dyspnea. Had experienced worsening lower extremity edema. He was continued on Coreg and Lasix with Entresto 24-26 mg twice daily being added to his medication regimen. Repeat BMET on 01/02/2019 showed stable renal function with creatinine at 1.52 and he was continued on his current regimen.  In talking with the patient today, he reports significant improvement in his lower extremity edema since taking Lasix regularly and being started on Entresto. Still has significant lower extremity edema on examination. Weight had previously peaked at 260 lbs on his home scales and is now at 252 lbs (256 lbs on office scales). He has been trying to follow a low-sodium diet but reports his family does most of the cooking. He denies any recent chest pain or palpitations. He sleeps in a recliner and has done this for several years due to chronic back pain. No specific orthopnea or PND.  Not overly active at baseline but does walk to the mailbox.   Past Medical History:  Diagnosis Date   Ankylosing spondylitis (HCC)    Anxiety    Aortic root dilatation (HCC)    Mild   AR (aortic regurgitation)    Mild   Atrial fibrillation and  flutter (HCC)    Bladder cancer (HCC)    Chronic pain    Follow[facility] at [facility]   DDD (degenerative disc disease), lumbosacral    DDD (degenerative disc disease), thoracolumbar    Degenerative cervical disc    Depression    Diabetes mellitus type II    Diverticulitis    Essential hypertension, benign    GERD (gastroesophageal reflux disease)    Hyperlipidemia    Kidney stones    Migraine    Nonischemic cardiomyopathy ([place])    Cardiolite 12/09 LVEF 55%, minor luminal irregularities at cardiac catheterization   OSA on CPAP    Rheumatoid arthritis ([place])    Symptomatic bradycardia    Medtronic Advisa L dual-chamber pacemaker  05/2015 - Dr. Curt Bears    Past Surgical History:  Procedure Laterality Date   CARDIAC CATHETERIZATION  08/13/11   CARDIOVERSION N/A 07/02/2015   Procedure: CARDIOVERSION;  Surgeon: Sanda Klein, MD;  Location: [facility];  Service: Cardiovascular;  Laterality: N/A;   CYSTOSCOPY WITH HOLMIUM LASER LITHOTRIPSY  1990's   EP IMPLANTABLE DEVICE N/A 05/21/2015   MDT Advisa DR MRI pacemaker implanted by Dr Curt Bears   KNEE ARTHROSCOPY Right ~ [place]  ~ 2008   TRANSURETHRAL RESECTION OF BLADDER TUMOR WITH GYRUS (TURBT-GYRUS)  1990's?    Current Medications: Outpatient Medications Prior to Visit  Medication Sig Dispense Refill   acetaminophen (TYLENOL) 500 MG tablet Take  500 mg by mouth every 6 (six) hours as needed.     albuterol (PROVENTIL) (2.5 MG/3ML) 0.083% nebulizer solution Take 2.5 mg by nebulization every 6 (six) hours as needed for wheezing.     Alcohol Swabs 70 % PADS Use as directed 120 each 2   ALPRAZolam (XANAX) 0.5 MG tablet Take 0.5 mg by mouth 2 (two) times daily as needed.  1   BD PEN NEEDLE NANO U/F 32G X 4 MM MISC USE ONE AS DIRECTED - 4 injections per day 150 each 5   carvedilol (COREG) 6.25 MG tablet TAKE ONE TABLET BY MOUTH TWICE DAILY. TAKE WITH A MEAL. 60 tablet 6   Cholecalciferol  (VITAMIN D-3) 1000 UNITS CAPS Take 1,000 Units by mouth 2 (two) times daily.      cholestyramine (QUESTRAN) 4 g packet Take 4 g by mouth 3 (three) times daily with meals.     citalopram (CELEXA) 40 MG tablet Take 20-40 mg by mouth daily.      Continuous Blood Gluc Receiver (FREESTYLE LIBRE 14 DAY READER) DEVI 1 each by Does not apply route every 14 (fourteen) days. 1 Device 0   Continuous Blood Gluc Sensor (FREESTYLE LIBRE 14 DAY SENSOR) MISC 1 each by Does not apply route every 14 (fourteen) days. 6 each 2   ELIQUIS 5 MG TABS tablet TAKE ONE TABLET BY MOUTH TWICE DAILY 60 tablet 2   FREESTYLE LITE test strip USE TO CHECK BLOOD SUGAR FOUR TIMES DAILY. 100 each 5   gabapentin (NEURONTIN) 400 MG capsule Take 400 mg by mouth 3 (three) times daily.      ibuprofen (ADVIL,MOTRIN) 200 MG tablet Take 400 mg by mouth every 6 (six) hours as needed for headache or moderate pain.     Insulin Glargine (LANTUS SOLOSTAR) 100 UNIT/ML Solostar Pen INJECT 90 UNITS INTO SKIN DAILY AT 10PM. 30 mL 4   Insulin Pen Needle (B-D ULTRAFINE III SHORT PEN) 31G X 8 MM MISC Use to inject insulin 4 x daily 200 each 3   ipratropium-albuterol (DUONEB) 0.5-2.5 (3) MG/3ML SOLN Take 3 mLs by nebulization every 4 (four) hours as needed (for shortness of breath).     Lancets (FREESTYLE) lancets USE TO CHECK BLOOD SUGAR FOUR TIMES DAILY. 200 each 5   metoCLOPramide (REGLAN) 5 MG tablet Take 5 mg by mouth every 6 (six) hours as needed for nausea or vomiting.      nitroGLYCERIN (NITROSTAT) 0.4 MG SL tablet DISSOLVE ONE TABLET UNDER TONGUE EVERY 5 MINUTES UP TO 3 DOSES AS NEEDED FOR CHEST PAIN. IF no RELIEF AFTER 3 doses, proceed TO THE ER 25 tablet 3   NON FORMULARY 1 each by Other route See admin instructions. CPAP - Uses nightly     NOVOLOG FLEXPEN 100 UNIT/ML FlexPen INJECT 25-31 UNITS SUBCUTANEOUSLY THREE TIMES DAILY AS DIRECTED. TAKE WITH MEALS. 30 mL 2   potassium chloride (K-DUR) 10 MEQ tablet Take 1 tablet (10 mEq  total) by mouth daily. 90 tablet 1   sacubitril-valsartan (ENTRESTO) 24-26 MG Take 1 tablet by mouth 2 (two) times daily. 60 tablet 6   simvastatin (ZOCOR) 40 MG tablet TAKE ONE TABLET BY MOUTH AT BEDTIME 90 tablet 0   traMADol (ULTRAM) 50 MG tablet Take 1 tablet by mouth daily as needed.     TRELEGY ELLIPTA 100-62.5-25 MCG/INH AEPB Inhale 1 puff into the lungs daily.     vitamin B-12 (CYANOCOBALAMIN) 1000 MCG tablet Take 1,000 mcg by mouth 2 (two) times daily.  VOLTAREN 1 % GEL Apply 2 g topically 4 (four) times daily as needed (for pain).      furosemide (LASIX) 20 MG tablet Take 1 tablet (20 mg total) by mouth daily. 90 tablet 1   No facility-administered medications prior to visit.      Allergies:   Patient has no known allergies.   Social History   Socioeconomic History   Marital status: Divorced    Spouse name: Not on file   Number of children: Not on file   Years of education: Not on file   Highest education level: Not on file  Occupational History   Occupation: Disabled    Employer: [employer] resource strain: Not on file   Food insecurity    Worry: Not on file    Inability: Not on file   Transportation needs    Medical: Not on file    Non-medical: Not on file  Tobacco Use   Smoking status: Current Every Day Smoker    Packs/day: 0.30    Years: 50.00    Pack years: 15.00    Types: E-cigarettes    Start date: 02/26/1959    Last attempt to quit: 12/06/2013    Years since quitting: 5.1   Smokeless tobacco: Former Systems developer    Types: Chew   Tobacco comment: 05/21/2015 "quit chewing in 1992"  Substance and Sexual Activity   Alcohol use: Yes    Alcohol/week: 2.0 standard drinks    Types: 2 Cans of beer per week   Drug use: Yes    Types: Cocaine    Comment: 05/21/2015 "quit in the early 2000"   Sexual activity: Not on file  Lifestyle   Physical activity    Days per week: Not on file    Minutes per session: Not on file     Stress: Not on file  Relationships   Social connections    Talks on phone: Not on file    Gets together: Not on file    Attends religious service: Not on file    Active member of club or organization: Not on file    Attends meetings of clubs or organizations: Not on file    Relationship status: Not on file  Other Topics Concern   Not on file  Social History Narrative   Divorced   No regular exercise     Family History:  The patient's family history includes Cancer in his father; Heart attack in his mother.   Review of Systems:   Please see the history of present illness.     General:  No chills, fever, night sweats or weight changes.  Cardiovascular:  No chest pain, dyspnea on exertion, orthopnea, palpitations, paroxysmal nocturnal dyspnea. Positive for lower extremity edema.  Dermatological: No rash, lesions/masses Respiratory: No cough, dyspnea Urologic: No hematuria, dysuria Abdominal:   No nausea, vomiting, diarrhea, bright red blood per rectum, melena, or hematemesis Neurologic:  No visual changes, wkns, changes in mental status. All other systems reviewed and are otherwise negative except as noted above.   Physical Exam:    VS:  BP 132/78 (BP Location: Right Arm)    Pulse 74    Temp 97.9 F (36.6 C)    Ht 5\' 9"  (1.753 m)    Wt 256 lb (116.1 kg)    SpO2 95%    BMI 37.80 kg/m    General: Well developed, well nourished,male appearing in no acute distress. Head: Normocephalic, atraumatic, sclera non-icteric, no xanthomas,  nares are without discharge.  Neck: No carotid bruits. JVD not elevated.  Lungs: Respirations regular and unlabored, without wheezes or rales.  Heart: Irregularly irregular. No S3 or S4.  No murmur, no rubs, or gallops appreciated. Abdomen: Soft, non-tender, non-distended with normoactive bowel sounds. No hepatomegaly. No rebound/guarding. No obvious abdominal masses. Msk:  Strength and tone appear normal for age. No joint deformities or  effusions. Extremities: No clubbing or cyanosis. 1+ pitting edema up to knees bilaterally.  Distal pedal pulses are 2+ bilaterally. Neuro: Alert and oriented X 3. Moves all extremities spontaneously. No focal deficits noted. Psych:  Responds to questions appropriately with a normal affect. Skin: No rashes or lesions noted  Wt Readings from Last 3 Encounters:  01/19/19 256 lb (116.1 kg)  12/22/18 250 lb (113.4 kg)  06/13/18 250 lb (113.4 kg)        Studies/Labs Reviewed:   EKG:  EKG is ordered today. The ekg ordered today demonstrates V-pacing with underlying atrial fibrillation, HR 88. Frequent PVC's and RBBB.   Recent Labs: 09/14/2018: BUN 16; Creatinine 1.5   Lipid Panel    Component Value Date/Time   CHOL 111 12/06/2017   TRIG 423 (A) 12/06/2017   HDL 33 (A) 12/06/2017    Additional studies/ records that were reviewed today include:   Echocardiogram: 12/2018   Assessment:    1. Acute on chronic combined systolic and diastolic CHF (congestive heart failure) ([place])   2. Symptomatic bradycardia   3. Permanent atrial fibrillation   4. Essential hypertension   5. Hyperlipidemia LDL goal <70      Plan:   In order of problems listed above:  1. Chronic Combined Systolic and Diastolic CHF - he has a known reduced EF of 30% and reports worsening edema over the past few months. Weight has declined by over 8 lbs on his home scales since taking Lasix regularly and starting Entresto. He still has significant edema on examination. Has baseline dyspnea on exertion in the setting of COPD but says his breathing has overall been stable. - will plan to titrate Lasix to 40mg  daily. Repeat BMET in 2 weeks. Continue Coreg 6.25mg  BID and Entresto 24-26mg  BID. If renal function remains stable with dose titration of Lasix, hopefully we can further increase Entresto at follow-up. Importance of a low-sodium diet and fluid restriction reviewed with the patient.   2. Symptomatic  bradycardia - s/p PPM placement which is followed by Dr. [name]. Most recent interrogation in 11/2018 showed normal device function.   3. Paroxysmal Atrial Fibrillation - he denies any recent palpitations. HR well-controlled during today's visit. Continue Coreg for rate-control. - he denies any evidence of active bleeding. Continue Eliquis 5mg  BID for anticoagulation.   4. HTN - BP well-controlled at 132/78 during today's visit. Continue Coreg and Entresto at current dosing. If kidney function remains stable with dose adjustment of Lasix, would plan to further titrate Entresto at follow-up as outlined above.   5. HLD - followed by PCP. He remains on Simvastatin 40mg  daily.    Medication Adjustments/Labs and Tests Ordered: Current medicines are reviewed at length with the patient today.  Concerns regarding medicines are outlined above.  Medication changes, Labs and Tests ordered today are listed in the Patient Instructions below. Patient Instructions  Medication Instructions: INCREASE Lasix to 40 mg daily  Labwork: BMET in 2-3 weeks  Procedures/Testing: None  Follow-Up:  Dr.McDowell in [place] office in 6-8 weeks  Any Additional Special Instructions Will Be Listed Below (If Applicable).  If you need a refill on your cardiac medications before your next appointment, please call your pharmacy.      Thank you for choosing [facility] !           Signed, Erma Heritage, PA-C  01/19/2019 6:30 PM    [place] [address] [place], [place] [address] Phone: [phone] Fax: [phone]

## 2019-01-19 NOTE — Patient Instructions (Signed)
Medication Instructions: INCREASE Lasix to 40 mg daily  Labwork: BMET in 2-3 weeks  Procedures/Testing: None  Follow-Up:  Dr.McDowell in [place] office in 6-8 weeks  Any Additional Special Instructions Will Be Listed Below (If Applicable).     If you need a refill on your cardiac medications before your next appointment, please call your pharmacy.      Thank you for choosing [facility] !

## 2019-01-24 ENCOUNTER — Other Ambulatory Visit: Payer: Self-pay | Admitting: "Endocrinology

## 2019-01-30 ENCOUNTER — Encounter: Payer: Self-pay | Admitting: *Deleted

## 2019-01-30 NOTE — Progress Notes (Signed)
Notification received from [facility] that entresto 24/26 mg approved through 01/28/2020. Faxed to pharmacy.

## 2019-02-01 NOTE — H&P (Signed)
Surgical History & Physical  Patient Name: Dylan Tyler DOB: 1945/10/20  Surgery: Cataract extraction with intraocular lens implant phacoemulsification; Left Eye  Surgeon: Baruch Goldmann MD Surgery Date:  02/10/2019 Pre-Op Date:  01/12/2019  HPI: A 14 Yr. old male patient referred by Dr. Radford Pax for cataract evaluation 1. 1. The patient complains of sunlight glare causing poor vision, which began 1 years ago. Both eyes are affected. The episode is gradual. The patient describes foggy, glare and hazy symptoms affecting their eyes/vision. Continues to decrease with time. Patient was told glasses would not improve [facility]. Distance and near [place] blurred constantly. This is negatively affecting the patient's quality of life. Using OTC readers with minimal help with reading. Significant glare with sunlight. *Significant SOB on exam today-COPD HPI was performed by Baruch Goldmann .  Medical History: Cataracts Hypertension Retinopathy Cataracts Anxiety  DM neuropathy Arthritis Diabetes Heart Problem High Blood Pressure LDL Lung Problems  Review of Systems Negative Allergic/Immunologic Negative Cardiovascular Negative Constitutional Negative Ear, Nose, Mouth & Throat Negative Endocrine Negative Eyes Negative Gastrointestinal Negative Genitourinary Negative Hemotologic/Lymphatic Negative Integumentary Negative Musculoskeletal Negative Neurological Negative Psychiatry Negative Respiratory  Social   Current every day smoker   Medication Ibuprofen, NovoLog, Duoneb, Lantus, Metoclopramide, Nitroglycerin, Simvastatin, Vitamin B-12, Vitamin D3, Voltaren, Proventil, Eliquis, Carvedilol, Cholstyramine, Citalopram, Gabapentin,   Sx/Procedures Pacemaker, stent, Gallbladder Removal, Left rotator cuff repair,   Drug Allergies   NKDA  History & Physical: Heent:  Cataract, Left eye NECK: supple without bruits LUNGS: lungs clear to auscultation CV: regular rate and rhythm Abdomen: soft and  non-tender Impression & Plan: Assessment: 1.  COMBINED FORMS AGE RELATED CATARACT; Both Eyes (H25.813)  Plan: 1.  Cataract accounts for the patient's decreased vision. This visual impairment is not correctable with a tolerable change in glasses or contact lenses. Cataract surgery with an implantation of a new lens should significantly improve the visual and functional status of the patient. Discussed all risks, benefits, alternatives, and potential complications. Discussed the procedures and recovery. Patient desires to have surgery. A-scan ordered and performed today for intra-ocular lens calculations. The surgery will be performed in order to improve vision for driving, reading, and for eye examinations. Recommend phacoemulsification with intra-ocular lens. Left Eye worse - first. Dilates poorly - shugacaine by protocol. Malyugin Ring in room.

## 2019-02-02 ENCOUNTER — Ambulatory Visit: Payer: PPO | Admitting: Cardiology

## 2019-02-02 ENCOUNTER — Ambulatory Visit (INDEPENDENT_AMBULATORY_CARE_PROVIDER_SITE_OTHER): Payer: PPO | Admitting: *Deleted

## 2019-02-02 DIAGNOSIS — I429 Cardiomyopathy, unspecified: Secondary | ICD-10-CM | POA: Diagnosis not present

## 2019-02-03 DIAGNOSIS — H25812 Combined forms of age-related cataract, left eye: Secondary | ICD-10-CM | POA: Diagnosis not present

## 2019-02-03 LAB — CUP PACEART REMOTE DEVICE CHECK
Battery Remaining Longevity: 39 mo
Battery Voltage: 2.98 V
Brady Statistic AP VP Percent: 0 %
Brady Statistic AP VS Percent: 0 %
Brady Statistic AS VP Percent: 92.56 %
Brady Statistic AS VS Percent: 7.44 %
Brady Statistic RA Percent Paced: 0 %
Brady Statistic RV Percent Paced: 93.75 %
Date Time Interrogation Session: 20200730232645
Implantable Lead Implant Date: 20161115
Implantable Lead Implant Date: 20161115
Implantable Lead Location: 753859
Implantable Lead Location: 753860
Implantable Lead Model: 5076
Implantable Lead Model: 5076
Implantable Pulse Generator Implant Date: 20161115
Lead Channel Impedance Value: 342 Ohm
Lead Channel Impedance Value: 361 Ohm
Lead Channel Impedance Value: 437 Ohm
Lead Channel Impedance Value: 475 Ohm
Lead Channel Pacing Threshold Amplitude: 0.5 V
Lead Channel Pacing Threshold Amplitude: 0.75 V
Lead Channel Pacing Threshold Pulse Width: 0.4 ms
Lead Channel Pacing Threshold Pulse Width: 0.4 ms
Lead Channel Sensing Intrinsic Amplitude: 11.375 mV
Lead Channel Sensing Intrinsic Amplitude: 11.375 mV
Lead Channel Sensing Intrinsic Amplitude: 2.125 mV
Lead Channel Sensing Intrinsic Amplitude: 2.125 mV
Lead Channel Setting Pacing Amplitude: 2.5 V
Lead Channel Setting Pacing Pulse Width: 0.4 ms
Lead Channel Setting Sensing Sensitivity: 2.8 mV

## 2019-02-06 NOTE — Patient Instructions (Signed)
Dylan Tyler  02/06/2019     @PREFPERIOPPHARMACY @   Your procedure is scheduled on 02/10/19.  Report to [facility] at 11:10 A.M.  Call this number if you have problems the morning of surgery:  [phone]   Remember:  Do not eat or drink after midnight.      Take these medicines the morning of surgery with A SIP OF WATER No AM insulin, Inhalers, Duoneb, Xanax, Coreg, Celexa, Ultram, Duoneb, Reglan, Gabapentin,     Do not wear jewelry, make-up or nail polish.  Do not wear lotions, powders, or perfumes, or deodorant.  Do not shave 48 hours prior to surgery.  Men may shave face and neck.  Do not bring valuables to the hospital.  [facility] is not responsible for any belongings or valuables.  Contacts, dentures or bridgework may not be worn into surgery.  Leave your suitcase in the car.  After surgery it may be brought to your room.  For patients admitted to the hospital, discharge time will be determined by your treatment team.  Patients discharged the day of surgery will not be allowed to drive home.    Please read over the following fact sheets that you were given. Anesthesia Post-op Instructions     PATIENT INSTRUCTIONS POST-ANESTHESIA  IMMEDIATELY FOLLOWING SURGERY:  Do not drive or operate machinery for the first twenty four hours after surgery.  Do not make any important decisions for twenty four hours after surgery or while taking narcotic pain medications or sedatives.  If you develop intractable nausea and vomiting or a severe headache please notify your doctor immediately.  FOLLOW-UP:  Please make an appointment with your surgeon as instructed. You do not need to follow up with anesthesia unless specifically instructed to do so.  WOUND CARE INSTRUCTIONS (if applicable):  Keep a dry clean dressing on the anesthesia/puncture wound site if there is drainage.  Once the wound has quit draining you may leave it open to air.  Generally you should leave the bandage intact  for twenty four hours unless there is drainage.  If the epidural site drains for more than 36-48 hours please call the anesthesia department.  QUESTIONS?:  Please feel free to call your physician or the hospital operator if you have any questions, and they will be happy to assist you.      Cataract Surgery Cataract surgery is a procedure to remove a cloudy lens (cataract) in the eye. The lens focuses light inside the eye. When a lens becomes cloudy, your vision is affected. Another lens (intraocular lens or IOL) is usually inserted to replace the cloudy lens and to properly focus light in the eye. Cataract surgery is usually recommended when the cataract is causing problems with daily functioning, such as reading, watching television, or driving. Tell a health care provider about:  Any allergies you have.  All medicines you are taking, including vitamins, herbs, eye drops, creams, and over-the-counter medicines.  Any problems you or family members have had with anesthetic medicines.  Any blood disorders you have.  Any surgeries you have had, especially eye surgeries that include refractive surgery, such as PRK and LASIK.  Any medical conditions you have.  Whether you are pregnant or may be pregnant. What are the risks? Generally, this is a safe procedure. However, problems may occur, including:  Infection.  Bleeding.  High pressure in the eye (glaucoma).  Detachment of the retina.  Corneal swelling.  Allergic reactions to medicines.  Damage to other  structures or organs.  Inflammation of the eye.  Clouding of the part of your eye that holds an IOL in place (after-cataract). This is common and can be treated at a later date with laser surgery.  An IOL moving out of position (rare).  Loss of vision (rare). What happens before the procedure? Staying hydrated Follow instructions from your health care provider about hydration, which may include:  Up to 2 to 6 hours before  the procedure - you may continue to drink clear liquids, such as water, clear fruit juice, black coffee, and plain tea. Exact instructions will be given by your health care provider.  Eating and drinking restrictions Follow instructions from your health care provider about eating and drinking, which may include:  8 hours before the procedure - stop eating heavy meals or foods, such as meat, fried foods, or fatty foods.  6-8 hours before the procedure - stop eating light meals or foods, such as toast or cereal.  6-8 hours before the procedure - stop drinking milk or drinks that contain milk.  2-6 hours before the procedure - stop drinking clear liquids. Medicines Ask your health care provider about:  Changing or stopping your regular medicines. This is especially important if you are taking diabetes medicines or blood thinners.  Taking medicines such as aspirin and ibuprofen. These medicines can thin your blood. Do not take these medicines unless your health care provider tells you to take them.  Taking over-the-counter medicines, vitamins, herbs, and supplements. General instructions  Do not put contact lenses in either eye on the day of your surgery.  Plan to have someone take you home from the hospital or clinic.  If you will be going home right after the procedure, plan to have someone with you for 24 hours.  Ask your health care provider what steps will be taken to help prevent infection. These may include: ? Washing skin with a germ-killing soap. ? Taking antibiotic medicine. What happens during the procedure?      An IV may be inserted into one of your veins.  You will be given one or both of the following: ? A medicine to help you relax (sedative). ? A medicine to numb the area (local anesthetic). This may be numbing eye drops or an injection that is given behind the eye.  A small cut (incision) will be made to the edge of the clear surface that covers the front of  the eye (cornea).  A small probe will be inserted into the eye. This device gives off ultrasound waves that soften and break up the cloudy center of the lens. This makes it easier for the cataract to be removed.  The cataract will be removed by suction.  An IOL may be implanted.  Part of the capsule that surrounds the lens will be left in the eye to support the IOL.  Your surgeon may use stitches (sutures) to close the incision. The procedure may vary among health care providers and hospitals. What happens after the procedure?  Your blood pressure, heart rate, breathing rate, and blood oxygen level will be monitored until you leave the hospital or clinic.  You may be given a protective shield to wear over your eye.  You may be given medicines to relieve discomfort and swelling. Some medicines may be in the form of eye drops.  Do not drive for 24 hours if you were given a sedative during your procedure. Summary  Cataract surgery is a procedure to remove  a cloudy lens (cataract) in the eye.  This is a safe procedure. However, problems may occur, including infection, bleeding, glaucoma, inflammation, and loss of vision.  Follow your health care provider's instructions about when to stop eating and drinking and whether to change or stop certain medicines.  After the procedure, you may be given medicines or an eye shield to wear over your eye.  Do not drive for 24 hours if you were given a sedative during your procedure. This information is not intended to replace advice given to you by your health care provider. Make sure you discuss any questions you have with your health care provider. Document Released: 06/11/2011 Document Revised: 12/20/2017 Document Reviewed: 12/20/2017 Elsevier Patient Education  2020 [employer].

## 2019-02-08 ENCOUNTER — Other Ambulatory Visit (HOSPITAL_COMMUNITY)
Admission: RE | Admit: 2019-02-08 | Discharge: 2019-02-08 | Disposition: A | Discharge status: Home / Self Care | Payer: PPO | Source: Ambulatory Visit | Attending: Student | Admitting: Student

## 2019-02-08 ENCOUNTER — Other Ambulatory Visit (HOSPITAL_COMMUNITY)
Admission: RE | Admit: 2019-02-08 | Discharge: 2019-02-08 | Disposition: A | Discharge status: Home / Self Care | Payer: PPO | Source: Ambulatory Visit | Attending: Ophthalmology | Admitting: Ophthalmology

## 2019-02-08 ENCOUNTER — Other Ambulatory Visit (HOSPITAL_COMMUNITY)
Admission: RE | Admit: 2019-02-08 | Discharge: 2019-02-08 | Disposition: A | Discharge status: Home / Self Care | Payer: PPO | Source: Ambulatory Visit | Attending: "Endocrinology | Admitting: "Endocrinology

## 2019-02-08 ENCOUNTER — Encounter (HOSPITAL_COMMUNITY)
Admission: RE | Admit: 2019-02-08 | Discharge: 2019-02-08 | Disposition: A | Discharge status: Home / Self Care | Payer: PPO | Source: Ambulatory Visit | Attending: Ophthalmology | Admitting: Ophthalmology

## 2019-02-08 ENCOUNTER — Other Ambulatory Visit: Payer: Self-pay

## 2019-02-08 ENCOUNTER — Encounter (HOSPITAL_COMMUNITY): Payer: Self-pay

## 2019-02-08 DIAGNOSIS — Z01812 Encounter for preprocedural laboratory examination: Secondary | ICD-10-CM | POA: Diagnosis not present

## 2019-02-08 DIAGNOSIS — Z20828 Contact with and (suspected) exposure to other viral communicable diseases: Secondary | ICD-10-CM | POA: Diagnosis not present

## 2019-02-08 HISTORY — DX: Personal history of urinary calculi: Z87.442

## 2019-02-08 HISTORY — DX: Chronic obstructive pulmonary disease, unspecified: J44.9

## 2019-02-08 LAB — HEMOGLOBIN A1C
Hgb A1c MFr Bld: 7.2 % — ABNORMAL HIGH (ref 4.8–5.6)
Mean Plasma Glucose: 159.94 mg/dL

## 2019-02-08 LAB — COMPREHENSIVE METABOLIC PANEL
ALT: 17 U/L (ref 0–44)
AST: 19 U/L (ref 15–41)
Albumin: 3.6 g/dL (ref 3.5–5.0)
Alkaline Phosphatase: 54 U/L (ref 38–126)
Anion gap: 13 (ref 5–15)
BUN: 21 mg/dL (ref 8–23)
CO2: 26 mmol/L (ref 22–32)
Calcium: 8.5 mg/dL — ABNORMAL LOW (ref 8.9–10.3)
Chloride: 100 mmol/L (ref 98–111)
Creatinine, Ser: 1.63 mg/dL — ABNORMAL HIGH (ref 0.61–1.24)
GFR calc Af Amer: 48 mL/min — ABNORMAL LOW (ref >60)
GFR calc non Af Amer: 41 mL/min — ABNORMAL LOW (ref >60)
Glucose, Bld: 138 mg/dL — ABNORMAL HIGH (ref 70–99)
Potassium: 4.1 mmol/L (ref 3.5–5.1)
Sodium: 139 mmol/L (ref 135–145)
Total Bilirubin: 1 mg/dL (ref 0.3–1.2)
Total Protein: 6.4 g/dL — ABNORMAL LOW (ref 6.5–8.1)

## 2019-02-08 LAB — BASIC METABOLIC PANEL
Anion gap: 12 (ref 5–15)
BUN: 22 mg/dL (ref 8–23)
CO2: 25 mmol/L (ref 22–32)
Calcium: 8.4 mg/dL — ABNORMAL LOW (ref 8.9–10.3)
Chloride: 101 mmol/L (ref 98–111)
Creatinine, Ser: 1.65 mg/dL — ABNORMAL HIGH (ref 0.61–1.24)
GFR calc Af Amer: 47 mL/min — ABNORMAL LOW (ref >60)
GFR calc non Af Amer: 41 mL/min — ABNORMAL LOW (ref >60)
Glucose, Bld: 144 mg/dL — ABNORMAL HIGH (ref 70–99)
Potassium: 4 mmol/L (ref 3.5–5.1)
Sodium: 138 mmol/L (ref 135–145)

## 2019-02-08 LAB — SARS CORONAVIRUS 2 (TAT 6-24 HRS): SARS Coronavirus 2: NEGATIVE

## 2019-02-09 NOTE — Pre-Procedure Instructions (Signed)
HgbA1C routed to PCP. 

## 2019-02-10 ENCOUNTER — Ambulatory Visit (HOSPITAL_COMMUNITY): Payer: PPO | Admitting: Anesthesiology

## 2019-02-10 ENCOUNTER — Encounter (HOSPITAL_COMMUNITY): Payer: Self-pay | Admitting: *Deleted

## 2019-02-10 ENCOUNTER — Ambulatory Visit (HOSPITAL_COMMUNITY)
Admission: RE | Admit: 2019-02-10 | Discharge: 2019-02-10 | Disposition: A | Discharge status: Home / Self Care | Payer: PPO | Attending: Ophthalmology | Admitting: Ophthalmology

## 2019-02-10 ENCOUNTER — Encounter: Payer: Self-pay | Admitting: Cardiology

## 2019-02-10 ENCOUNTER — Encounter (HOSPITAL_COMMUNITY)
Admission: RE | Disposition: A | Discharge status: Home / Self Care | Payer: Self-pay | Source: Home / Self Care | Attending: Ophthalmology

## 2019-02-10 DIAGNOSIS — E1136 Type 2 diabetes mellitus with diabetic cataract: Secondary | ICD-10-CM | POA: Insufficient documentation

## 2019-02-10 DIAGNOSIS — Z794 Long term (current) use of insulin: Secondary | ICD-10-CM | POA: Insufficient documentation

## 2019-02-10 DIAGNOSIS — Z7901 Long term (current) use of anticoagulants: Secondary | ICD-10-CM | POA: Insufficient documentation

## 2019-02-10 DIAGNOSIS — Z950 Presence of cardiac pacemaker: Secondary | ICD-10-CM | POA: Diagnosis not present

## 2019-02-10 DIAGNOSIS — F17200 Nicotine dependence, unspecified, uncomplicated: Secondary | ICD-10-CM | POA: Diagnosis not present

## 2019-02-10 DIAGNOSIS — H25812 Combined forms of age-related cataract, left eye: Secondary | ICD-10-CM | POA: Insufficient documentation

## 2019-02-10 DIAGNOSIS — M1990 Unspecified osteoarthritis, unspecified site: Secondary | ICD-10-CM | POA: Insufficient documentation

## 2019-02-10 DIAGNOSIS — E1140 Type 2 diabetes mellitus with diabetic neuropathy, unspecified: Secondary | ICD-10-CM | POA: Insufficient documentation

## 2019-02-10 DIAGNOSIS — Z79899 Other long term (current) drug therapy: Secondary | ICD-10-CM | POA: Diagnosis not present

## 2019-02-10 DIAGNOSIS — H2181 Floppy iris syndrome: Secondary | ICD-10-CM | POA: Insufficient documentation

## 2019-02-10 DIAGNOSIS — E11319 Type 2 diabetes mellitus with unspecified diabetic retinopathy without macular edema: Secondary | ICD-10-CM | POA: Diagnosis not present

## 2019-02-10 DIAGNOSIS — I509 Heart failure, unspecified: Secondary | ICD-10-CM | POA: Insufficient documentation

## 2019-02-10 DIAGNOSIS — G4730 Sleep apnea, unspecified: Secondary | ICD-10-CM | POA: Diagnosis not present

## 2019-02-10 DIAGNOSIS — J449 Chronic obstructive pulmonary disease, unspecified: Secondary | ICD-10-CM | POA: Insufficient documentation

## 2019-02-10 DIAGNOSIS — F419 Anxiety disorder, unspecified: Secondary | ICD-10-CM | POA: Diagnosis not present

## 2019-02-10 DIAGNOSIS — I110 Hypertensive heart disease with heart failure: Secondary | ICD-10-CM | POA: Insufficient documentation

## 2019-02-10 DIAGNOSIS — E119 Type 2 diabetes mellitus without complications: Secondary | ICD-10-CM | POA: Diagnosis not present

## 2019-02-10 HISTORY — PX: CATARACT EXTRACTION W/PHACO: SHX586

## 2019-02-10 LAB — GLUCOSE, CAPILLARY: Glucose-Capillary: 167 mg/dL — ABNORMAL HIGH (ref 70–99)

## 2019-02-10 SURGERY — PHACOEMULSIFICATION, CATARACT, WITH IOL INSERTION
Anesthesia: Monitor Anesthesia Care | Site: Eye | Laterality: Left

## 2019-02-10 MED ORDER — BSS IO SOLN
INTRAOCULAR | Status: DC | PRN
  Administered 2019-02-10: 15 mL via INTRAOCULAR

## 2019-02-10 MED ORDER — EPINEPHRINE PF 1 MG/ML IJ SOLN
INTRAOCULAR | Status: DC | PRN
  Administered 2019-02-10: 12:00:00 500 mL

## 2019-02-10 MED ORDER — NEOMYCIN-POLYMYXIN-DEXAMETH 3.5-10000-0.1 OP SUSP
OPHTHALMIC | Status: DC | PRN
  Administered 2019-02-10: 2 [drp] via OPHTHALMIC

## 2019-02-10 MED ORDER — CYCLOPENTOLATE-PHENYLEPHRINE 0.2-1 % OP SOLN
1.0000 [drp] | OPHTHALMIC | Status: AC | PRN
  Administered 2019-02-10 (×3): 1 [drp] via OPHTHALMIC

## 2019-02-10 MED ORDER — PHENYLEPHRINE HCL 2.5 % OP SOLN
1.0000 [drp] | OPHTHALMIC | Status: AC | PRN
  Administered 2019-02-10 (×3): 1 [drp] via OPHTHALMIC

## 2019-02-10 MED ORDER — TETRACAINE HCL 0.5 % OP SOLN
1.0000 [drp] | OPHTHALMIC | Status: AC | PRN
  Administered 2019-02-10 (×3): 1 [drp] via OPHTHALMIC

## 2019-02-10 MED ORDER — SODIUM HYALURONATE 23 MG/ML IO SOLN
INTRAOCULAR | Status: DC | PRN
  Administered 2019-02-10: 0.6 mL via INTRAOCULAR

## 2019-02-10 MED ORDER — LIDOCAINE HCL (PF) 1 % IJ SOLN
INTRAOCULAR | Status: DC | PRN
  Administered 2019-02-10: 1 mL via OPHTHALMIC

## 2019-02-10 MED ORDER — LIDOCAINE HCL 3.5 % OP GEL
1.0000 "application " | Freq: Once | OPHTHALMIC | Status: AC
  Administered 2019-02-10: 1 via OPHTHALMIC

## 2019-02-10 MED ORDER — POVIDONE-IODINE 5 % OP SOLN
OPHTHALMIC | Status: DC | PRN
  Administered 2019-02-10: 1 via OPHTHALMIC

## 2019-02-10 MED ORDER — PROVISC 10 MG/ML IO SOLN
INTRAOCULAR | Status: DC | PRN
  Administered 2019-02-10: 0.85 mL via INTRAOCULAR

## 2019-02-10 SURGICAL SUPPLY — 14 items

## 2019-02-10 NOTE — Transfer of Care (Signed)
Immediate Anesthesia Transfer of Care Note  Patient: Dylan Tyler  Procedure(s) Performed: CATARACT EXTRACTION PHACO AND INTRAOCULAR LENS PLACEMENT (IOC) (Left Eye)  Patient Location: Short Stay  Anesthesia Type:MAC  Level of Consciousness: awake, alert  and patient cooperative  Airway & Oxygen Therapy: Patient Spontanous Breathing  Post-op Assessment: Report given to RN and Post -op Vital signs reviewed and stable  Post vital signs: Reviewed and stable  Last Vitals:  Vitals Value Taken Time  BP 159/71   Temp 98.8   Pulse 71   Resp 18   SpO2 95     Last Pain:  Vitals:   02/10/19 1124  TempSrc: Oral  PainSc: 3       Patients Stated Pain Goal: 7 (64/33/29 [id number])  Complications: No apparent anesthesia complications

## 2019-02-10 NOTE — Interval H&P Note (Signed)
History and Physical Interval Note: The H and P was reviewed and updated. The patient was examined.  No changes were found after exam.  The surgical eye was marked.  02/10/2019 12:00 PM  Dylan Tyler  has presented today for surgery, with the diagnosis of nuclear cataract left eye.  The various methods of treatment have been discussed with the patient and family. After consideration of risks, benefits and other options for treatment, the patient has consented to  Procedure(s) with comments: CATARACT EXTRACTION PHACO AND INTRAOCULAR LENS PLACEMENT (IOC) (Left) - CDE:  as a surgical intervention.  The patient's history has been reviewed, patient examined, no change in status, stable for surgery.  I have reviewed the patient's chart and labs.  Questions were answered to the patient's satisfaction.     Baruch Goldmann

## 2019-02-10 NOTE — Discharge Instructions (Addendum)
Monitored Anesthesia Care, Care After °These instructions provide you with information about caring for yourself after your procedure. Your health care provider may also give you more specific instructions. Your treatment has been planned according to current medical practices, but problems sometimes occur. Call your health care provider if you have any problems or questions after your procedure. °What can I expect after the procedure? °After your procedure, you may: °· Feel sleepy for several hours. °· Feel clumsy and have poor balance for several hours. °· Feel forgetful about what happened after the procedure. °· Have poor judgment for several hours. °· Feel nauseous or vomit. °· Have a sore throat if you had a breathing tube during the procedure. °Follow these instructions at home: °For at least 24 hours after the procedure: ° °  ° °· Have a responsible adult stay with you. It is important to have someone help care for you until you are awake and alert. °· Rest as needed. °· Do not: °? Participate in activities in which you could fall or become injured. °? Drive. °? Use heavy machinery. °? Drink alcohol. °? Take sleeping pills or medicines that cause drowsiness. °? Make important decisions or sign legal documents. °? Take care of children on your own. °Eating and drinking °· Follow the diet that is recommended by your health care provider. °· If you vomit, drink water, juice, or soup when you can drink without vomiting. °· Make sure you have little or no nausea before eating solid foods. °General instructions °· Take over-the-counter and prescription medicines only as told by your health care provider. °· If you have sleep apnea, surgery and certain medicines can increase your risk for breathing problems. Follow instructions from your health care provider about wearing your sleep device: °? Anytime you are sleeping, including during daytime naps. °? While taking prescription pain medicines, sleeping medicines,  or medicines that make you drowsy. °· If you smoke, do not smoke without supervision. °· Keep all follow-up visits as told by your health care provider. This is important. °Contact a health care provider if: °· You keep feeling nauseous or you keep vomiting. °· You feel light-headed. °· You develop a rash. °· You have a fever. °Get help right away if: °· You have trouble breathing. °Summary °· For several hours after your procedure, you may feel sleepy and have poor judgment. °· Have a responsible adult stay with you for at least 24 hours or until you are awake and alert. °This information is not intended to replace advice given to you by your health care provider. Make sure you discuss any questions you have with your health care provider. °Document Released: 10/13/2015 Document Revised: 09/20/2017 Document Reviewed: 10/13/2015 °Elsevier Patient Education © 2020 [employer] °Please discharge patient when stable, will follow up today with Dr. Wrzosek at the [facility] office immediately following discharge.  Leave shield in place until visit.  All paperwork with discharge instructions will be given at the office. ° °

## 2019-02-10 NOTE — Anesthesia Procedure Notes (Signed)
Procedure Name: MAC Date/Time: 02/10/2019 12:02 PM Performed by: Vista Deck, CRNA Pre-anesthesia Checklist: Patient identified, Emergency Drugs available, Suction available, Timeout performed and Patient being monitored Patient Re-evaluated:Patient Re-evaluated prior to induction Oxygen Delivery Method: Nasal Cannula

## 2019-02-10 NOTE — Anesthesia Preprocedure Evaluation (Addendum)
Anesthesia Evaluation  Patient identified by MRN, date of birth, ID band Patient awake    Reviewed: Allergy & Precautions, NPO status , Patient's Chart, lab work & pertinent test results, reviewed documented beta blocker date and time   Airway Mallampati: III  TM Distance: >3 FB Neck ROM: Full    Dental  (+) Edentulous Upper, Edentulous Lower   Pulmonary sleep apnea , COPD, Current Smoker,    breath sounds clear to auscultation       Cardiovascular hypertension (took carvediol - 02/09/19), Pt. on medications and Pt. on home beta blockers +CHF (EF - 30%) and + DOE  + dysrhythmias + pacemaker  Rhythm:Regular  V-pacing with underlying atrial fibrillation - frequent multiform ectopic ventricular beats  # VECs = 3, # types 2 -Right bundle branch block with left axis -bifascicular block.   - (age undetermined)  Extensive anterior-lateral and  (age undetermined)  Inferior infarct.    Neuro/Psych  Headaches, PSYCHIATRIC DISORDERS Anxiety Depression    GI/Hepatic GERD  ,  Endo/Other  diabetes, Type 2, Insulin Dependent  Renal/GU Renal InsufficiencyRenal disease     Musculoskeletal  (+) Arthritis ,   Abdominal (+) + obese,   Peds  Hematology On Eliquis   Anesthesia Other Findings   Reproductive/Obstetrics                            Anesthesia Physical Anesthesia Plan  ASA: IV  Anesthesia Plan: MAC   Post-op Pain Management:    Induction:   PONV Risk Score and Plan:   Airway Management Planned: Nasal Cannula and Natural Airway  Additional Equipment:   Intra-op Plan:   Post-operative Plan:   Informed Consent: I have reviewed the patients History and Physical, chart, labs and discussed the procedure including the risks, benefits and alternatives for the proposed anesthesia with the patient or authorized representative who has indicated his/her understanding and acceptance.       Plan  Discussed with: CRNA  Anesthesia Plan Comments:         Anesthesia Quick Evaluation

## 2019-02-10 NOTE — Op Note (Signed)
Date of procedure: 02/10/19  Pre-operative diagnosis: Visually significant cataract, Left Eye; Poor dilation, Left eye (H25.812; H21.81)  Post-operative diagnosis: Visually significant cataract, Left Eye; Intra-operative Floppy Iris Syndrome, Left Eye  Procedure: Complex removal of cataract via phacoemulsification and insertion of intra-ocular lens [employer] PCB00  +20.5D into the capsular bag of the Left Eye  Attending surgeon: Gerda Diss. Lawyer Washabaugh, MD, MA  Anesthesia: MAC, Topical Akten  Complications: None  Estimated Blood Loss: <56m (minimal)  Specimens: None  Implants: As above  Indications:  Visually significant cataract, Left Eye  Procedure:  The patient was seen and identified in the pre-operative area. The operative eye was identified and dilated.  The operative eye was marked.  Topical anesthesia was administered to the operative eye.     The patient was then to the operative suite and placed in the supine position.  A timeout was performed confirming the patient, procedure to be performed, and all other relevant information.   The patient's face was prepped and draped in the usual fashion for intra-ocular surgery.  A lid speculum was placed into the operative eye and the surgical microscope moved into place and focused.  Poor dilation of the iris was confirmed.  An inferotemporal paracentesis was created using a 20 gauge paracentesis blade.  Shugarcaine was injected into the anterior chamber.  Viscoelastic was injected into the anterior chamber.  A temporal clear-corneal main wound incision was created using a 2.413mmicrokeratome.  A Malyugin ring was placed.  A continuous curvilinear capsulorrhexis was initiated using an irrigating cystitome and completed using capsulorrhexis forceps.  Hydrodissection and hydrodeliniation were performed.  Viscoelastic was injected into the anterior chamber.  A phacoemulsification handpiece and a chopper as a second instrument were  used to remove the nucleus and epinucleus. The irrigation/aspiration handpiece was used to remove any remaining cortical material.   The capsular bag was reinflated with viscoelastic, checked, and found to be intact.  The intraocular lens was inserted into the capsular bag and dialed into place using a Kuglen hook. The Malyugin ring was removed.  The irrigation/aspiration handpiece was used to remove any remaining viscoelastic.  The clear corneal wound and paracentesis wounds were then hydrated and checked with Weck-Cels to be watertight.  The lid-speculum and drape was removed, and the patient's face was cleaned with a wet and dry 4x4.  Maxitrol was instilled in the eye before a clear shield was taped over the eye. The patient was taken to the post-operative care unit in good condition, having tolerated the procedure well.  Post-Op Instructions: [facility] and will receive all other orders and instructions.

## 2019-02-10 NOTE — Progress Notes (Signed)
Remote pacemaker transmission.   

## 2019-02-10 NOTE — Anesthesia Postprocedure Evaluation (Signed)
Anesthesia Post Note  Patient: Dylan Tyler  Procedure(s) Performed: CATARACT EXTRACTION PHACO AND INTRAOCULAR LENS PLACEMENT ([place]) (Left Eye)  Patient location during evaluation: Short Stay Anesthesia Type: MAC Level of consciousness: awake and alert and patient cooperative Pain management: pain level controlled Vital Signs Assessment: post-procedure vital signs reviewed and stable Respiratory status: spontaneous breathing Cardiovascular status: stable Postop Assessment: no apparent nausea or vomiting Anesthetic complications: no     Last Vitals:  Vitals:   02/10/19 1124  BP: (!) 144/79  Pulse: 70  Resp: 14  Temp: 36.7 C  SpO2: 97%    Last Pain:  Vitals:   02/10/19 1124  TempSrc: Oral  PainSc: 3                  Anilah Huck

## 2019-02-13 ENCOUNTER — Telehealth: Payer: Self-pay

## 2019-02-13 ENCOUNTER — Encounter (HOSPITAL_COMMUNITY): Payer: Self-pay | Admitting: Ophthalmology

## 2019-02-13 NOTE — Telephone Encounter (Signed)
-----   Message from Erma Heritage, [place] sent at 02/08/2019 10:52 AM EDT ----- Please let the patient know his lab work showed stable electrolytes. Renal function has slightly worsened. Was 1.52 in 12/2018, now at 1.65. Was previously 1.5 - 1.7 last year. Given his significant edema at the time of his recent office visit, would continue his Lasix at current dosing for now and repeat a BMET in 2-3 weeks for reassessment. If creatinine continues to trend upwards, would need to reduce Lasix at that time. Please forward a copy of results to Glenda Chroman, MD.

## 2019-02-13 NOTE — Telephone Encounter (Signed)
Called pt. No answer on home phone, unable to leave message. Called cell phone, voicemail full.

## 2019-02-14 ENCOUNTER — Ambulatory Visit (INDEPENDENT_AMBULATORY_CARE_PROVIDER_SITE_OTHER): Payer: PPO | Admitting: "Endocrinology

## 2019-02-14 ENCOUNTER — Encounter: Payer: Self-pay | Admitting: "Endocrinology

## 2019-02-14 ENCOUNTER — Other Ambulatory Visit: Payer: Self-pay

## 2019-02-14 DIAGNOSIS — E782 Mixed hyperlipidemia: Secondary | ICD-10-CM | POA: Diagnosis not present

## 2019-02-14 DIAGNOSIS — N183 Chronic kidney disease, stage 3 (moderate): Secondary | ICD-10-CM

## 2019-02-14 DIAGNOSIS — IMO0002 Reserved for concepts with insufficient information to code with codable children: Secondary | ICD-10-CM

## 2019-02-14 DIAGNOSIS — I10 Essential (primary) hypertension: Secondary | ICD-10-CM

## 2019-02-14 DIAGNOSIS — E1165 Type 2 diabetes mellitus with hyperglycemia: Secondary | ICD-10-CM | POA: Diagnosis not present

## 2019-02-14 DIAGNOSIS — E1122 Type 2 diabetes mellitus with diabetic chronic kidney disease: Secondary | ICD-10-CM | POA: Diagnosis not present

## 2019-02-14 NOTE — Progress Notes (Signed)
02/14/2019                                                    Endocrinology Telehealth Visit Follow up Note -During COVID -19 Pandemic  This visit type was conducted due to national recommendations for restrictions regarding the COVID-19 Pandemic  in an effort to limit this patient's exposure and mitigate transmission of the corona virus.  Due to his co-morbid illnesses, Dylan Tyler is at  moderate to high risk for complications without adequate follow up.  This format is felt to be most appropriate for him at this time.  I connected with this patient on 02/14/2019   by telephone and verified that I am speaking with the correct person using two identifiers. Dylan Tyler, 07/17/1945. he has verbally consented to this visit. All issues noted in this document were discussed and addressed. The format was not optimal for physical exam.    Subjective:    Patient ID: Dylan Tyler, male    DOB: 1946/01/19.  he is engaged in telehealth  visit in follow-up for management of currently uncontrolled, symptomatic type 2 diabetes, hyperlipidemia.    PMD:  Glenda Chroman, MD.   Past Medical History:  Diagnosis Date  . Ankylosing spondylitis ([place])   . Anxiety   . Aortic root dilatation ([facility])    Mild  . AR (aortic regurgitation)    Mild  . Atrial fibrillation and flutter ([place])   . Bladder cancer ([place])   . Chronic pain    [facility]  . COPD (chronic obstructive pulmonary disease) ([place])   . DDD (degenerative disc disease), lumbosacral   . DDD (degenerative disc disease), thoracolumbar   . Degenerative cervical disc   . Depression   . Diabetes mellitus type II   . Diverticulitis   . Essential hypertension, benign   . GERD (gastroesophageal reflux disease)   . History of kidney stones   . Hyperlipidemia   . Kidney stones   . Migraine   . Nonischemic cardiomyopathy ([place])    Cardiolite 12/09 LVEF 55%, minor luminal irregularities at cardiac catheterization  . OSA on CPAP    doesnt  use any more, needs new machine and hasnt gotten one.  . Presence of permanent cardiac pacemaker   . Rheumatoid arthritis ([place])   . Symptomatic bradycardia    Medtronic Advisa L dual-chamber pacemaker  05/2015 - Dr. Curt Bears   Past Surgical History:  Procedure Laterality Date  . CARDIAC CATHETERIZATION  08/13/11  . CARDIOVERSION N/A 07/02/2015   Procedure: CARDIOVERSION;  Surgeon: Sanda Klein, MD;  Location: [facility];  Service: Cardiovascular;  Laterality: N/A;  . CATARACT EXTRACTION W/PHACO Left 02/10/2019   Procedure: CATARACT EXTRACTION PHACO AND INTRAOCULAR LENS PLACEMENT ([place]);  Surgeon: Baruch Goldmann, MD;  Location: [facility];  Service: Ophthalmology;  Laterality: Left;  CDE: 7.10  . CYSTOSCOPY WITH HOLMIUM LASER LITHOTRIPSY  1990's  . EP IMPLANTABLE DEVICE N/A 05/21/2015   MDT Advisa DR MRI pacemaker implanted by Dr Curt Bears  . KNEE ARTHROSCOPY Right ~ 1991  . LAPAROSCOPIC CHOLECYSTECTOMY  ~ 2008  . TRANSURETHRAL RESECTION OF BLADDER TUMOR WITH GYRUS (TURBT-GYRUS)  1990's?   Social History   Socioeconomic History  . Marital status: Divorced    Spouse name: Not on file  . Number of children: Not on file  .  Years of education: Not on file  . Highest education level: Not on file  Occupational History  . Occupation: Disabled    Employer: DISABLED  Social Needs  . Financial resource strain: Not on file  . Food insecurity    Worry: Not on file    Inability: Not on file  . Transportation needs    Medical: Not on file    Non-medical: Not on file  Tobacco Use  . Smoking status: Current Every Day Smoker    Packs/day: 0.30    Years: 50.00    Pack years: 15.00    Types: E-cigarettes    Start date: 02/26/1959    Last attempt to quit: 12/06/2013    Years since quitting: 5.1  . Smokeless tobacco: Former Systems developer    Types: Chew  . Tobacco comment: 05/21/2015 "quit chewing in 1992"  Substance and Sexual Activity  . Alcohol use: Yes    Alcohol/week: 2.0 standard drinks    Types: 2  Cans of beer per week  . Drug use: Yes    Types: Cocaine    Comment: 05/21/2015 "quit in the early 2000"  . Sexual activity: Not on file  Lifestyle  . Physical activity    Days per week: Not on file    Minutes per session: Not on file  . Stress: Not on file  Relationships  . Social Herbalist on phone: Not on file    Gets together: Not on file    Attends religious service: Not on file    Active member of club or organization: Not on file    Attends meetings of clubs or organizations: Not on file    Relationship status: Not on file  Other Topics Concern  . Not on file  Social History Narrative   Divorced   No regular exercise   Outpatient Encounter Medications as of 02/14/2019  Medication Sig  . acetaminophen (TYLENOL) 500 MG tablet Take 500 mg by mouth every 6 (six) hours as needed (pain).   Marland Kitchen albuterol (PROVENTIL) (2.5 MG/3ML) 0.083% nebulizer solution Take 2.5 mg by nebulization every 6 (six) hours as needed for wheezing.  . Alcohol Swabs 70 % PADS Use as directed  . ALPRAZolam (XANAX) 0.5 MG tablet Take 0.5 mg by mouth 2 (two) times daily as needed.  . BD PEN NEEDLE NANO U/F 32G X 4 MM MISC USE ONE AS DIRECTED - 4 injections per day  . carvedilol (COREG) 6.25 MG tablet TAKE ONE TABLET BY MOUTH TWICE DAILY. TAKE WITH A MEAL. (Patient taking differently: Take 6.25 mg by mouth 2 (two) times daily with a meal. )  . Cholecalciferol (VITAMIN D-3) 1000 UNITS CAPS Take 1,000 Units by mouth 2 (two) times daily.   . cholestyramine (QUESTRAN) 4 g packet Take 4 g by mouth 3 (three) times daily with meals.  . citalopram (CELEXA) 40 MG tablet Take 20-40 mg by mouth daily.   . Continuous Blood Gluc Receiver (FREESTYLE LIBRE 14 DAY READER) DEVI USE AS DIRECTED.  Marland Kitchen Continuous Blood Gluc Sensor (FREESTYLE LIBRE 14 DAY SENSOR) MISC 1 each by Does not apply route every 14 (fourteen) days.  Marland Kitchen ELIQUIS 5 MG TABS tablet TAKE ONE TABLET BY MOUTH TWICE DAILY  . FREESTYLE LITE test strip USE  TO CHECK BLOOD SUGAR FOUR TIMES DAILY.  . furosemide (LASIX) 40 MG tablet Take 1 tablet (40 mg total) by mouth daily.  Marland Kitchen gabapentin (NEURONTIN) 400 MG capsule Take 400 mg by mouth 3 (three) times  daily.   . ibuprofen (ADVIL,MOTRIN) 200 MG tablet Take 400 mg by mouth every 6 (six) hours as needed for headache or moderate pain.  . Insulin Glargine (LANTUS SOLOSTAR) 100 UNIT/ML Solostar Pen INJECT 90 UNITS INTO SKIN DAILY AT 10PM.  . Insulin Pen Needle (B-D ULTRAFINE III SHORT PEN) 31G X 8 MM MISC Use to inject insulin 4 x daily  . ipratropium-albuterol (DUONEB) 0.5-2.5 (3) MG/3ML SOLN Take 3 mLs by nebulization every 4 (four) hours as needed (for shortness of breath).  . Lancets (FREESTYLE) lancets USE TO CHECK BLOOD SUGAR FOUR TIMES DAILY.  Marland Kitchen metoCLOPramide (REGLAN) 5 MG tablet Take 5 mg by mouth every 6 (six) hours as needed for nausea or vomiting.   . nitroGLYCERIN (NITROSTAT) 0.4 MG SL tablet DISSOLVE ONE TABLET UNDER TONGUE EVERY 5 MINUTES UP TO 3 DOSES AS NEEDED FOR CHEST PAIN. IF no RELIEF AFTER 3 doses, proceed TO THE ER (Patient taking differently: Place 0.4 mg under the tongue every 5 (five) minutes as needed for chest pain. )  . NON FORMULARY 1 each by Other route See admin instructions. CPAP - Uses nightly  . NOVOLOG FLEXPEN 100 UNIT/ML FlexPen INJECT 25-31 UNITS SUBCUTANEOUSLY THREE TIMES DAILY AS DIRECTED. TAKE WITH MEALS. (Patient taking differently: Inject 25-31 Units into the skin 3 (three) times daily with meals. Per sliding scale)  . potassium chloride (K-DUR) 10 MEQ tablet Take 1 tablet (10 mEq total) by mouth daily.  . sacubitril-valsartan (ENTRESTO) 24-26 MG Take 1 tablet by mouth 2 (two) times daily.  . simvastatin (ZOCOR) 40 MG tablet TAKE ONE TABLET BY MOUTH AT BEDTIME  . traMADol (ULTRAM) 50 MG tablet Take 1 tablet by mouth daily as needed (pain).   . TRELEGY ELLIPTA 100-62.5-25 MCG/INH AEPB Inhale 1 puff into the lungs daily.  . vitamin B-12 (CYANOCOBALAMIN) 1000 MCG tablet  Take 1,000 mcg by mouth 2 (two) times daily.    . VOLTAREN 1 % GEL Apply 2 g topically 4 (four) times daily as needed (for pain).    No facility-administered encounter medications on file as of 02/14/2019.     ALLERGIES: No Known Allergies  VACCINATION STATUS:  There is no immunization history on file for this patient.  Diabetes He presents for his follow-up diabetic visit. He has type 2 diabetes mellitus. Onset time: He was diagnosed at approximate age of 11 years. His disease course has been worsening (His most recent A1c was 9.8% on 01/12/2017.). There are no hypoglycemic associated symptoms. Pertinent negatives for hypoglycemia include no confusion, pallor or seizures. Associated symptoms include foot paresthesias, polydipsia and polyuria. Pertinent negatives for diabetes include no fatigue, no polyphagia and no weakness. There are no hypoglycemic complications. Symptoms are improving. Diabetic complications include peripheral neuropathy. Risk factors for coronary artery disease include dyslipidemia, diabetes mellitus, hypertension, male sex, sedentary lifestyle, tobacco exposure and family history. Current diabetic treatment includes oral agent (dual therapy). He is following a generally unhealthy diet. When asked about meal planning, he reported none. He has not had a previous visit with a dietitian. He never (He is wheelchair-bound due to deconditioning, and due todegenerative joint disease.) participates in exercise. His breakfast blood glucose range is generally >200 mg/dl. His lunch blood glucose range is generally 180-200 mg/dl. His dinner blood glucose range is generally 180-200 mg/dl. His bedtime blood glucose range is generally 180-200 mg/dl. His overall blood glucose range is >200 mg/dl. An ACE inhibitor/angiotensin II receptor blocker is being taken. He sees a podiatrist. Hyperlipidemia This is a chronic  problem. The current episode started more than 1 year ago. Exacerbating diseases  include diabetes and obesity. Factors aggravating his hyperlipidemia include smoking. Pertinent negatives include no myalgias. Current antihyperlipidemic treatment includes statins. Risk factors for coronary artery disease include dyslipidemia, diabetes mellitus, hypertension, a sedentary lifestyle, male sex and obesity.     Objective:    There were no vitals taken for this visit.  Wt Readings from Last 3 Encounters:  02/08/19 257 lb (116.6 kg)  01/19/19 256 lb (116.1 kg)  12/22/18 250 lb (113.4 kg)      Lipid Panel     Component Value Date/Time   CHOL 111 12/06/2017   TRIG 423 (A) 12/06/2017   HDL 33 (A) 12/06/2017   Recent Results (from the past 2160 hour(s))  CUP PACEART REMOTE DEVICE CHECK     Status: None   Collection Time: 02/02/19 11:26 PM  Result Value Ref Range   Date Time Interrogation Session [id number]    Pulse Generator Manufacturer MERM    Pulse Gen Model [id number] Advisa DR MRI    Pulse Gen Serial Number [id number] [place] [facility]    Implantable Pulse Generator Type Implantable Pulse Generator    Implantable Pulse Generator Implant Date [id number]    Implantable Lead Manufacturer MERM    Implantable Lead Model 5076 CapSureFix Novus    Implantable Lead Serial Number [id number]    Implantable Lead Implant Date [id number]    Implantable Lead Location Detail 1 UNKNOWN    Implantable Lead Location [id number]    Implantable Lead Manufacturer MERM    Implantable Lead Model 5076 CapSureFix Novus    Implantable Lead Serial Number [id number]    Implantable Lead Implant Date [id number]    Implantable Lead Location Detail 1 UNKNOWN    Implantable Lead Location [phone]    Lead Channel Setting Sensing Sensitivity 2.8 mV   Lead Channel Setting Pacing Pulse Width 0.4 ms   Lead Channel Setting Pacing Amplitude 2.5 V   Lead Channel Impedance Value 475 ohm   Lead Channel Impedance Value 342 ohm   Lead Channel Sensing Intrinsic Amplitude 2.125 mV   Lead Channel  Sensing Intrinsic Amplitude 2.125 mV   Lead Channel Pacing Threshold Amplitude 0.5 V   Lead Channel Pacing Threshold Pulse Width 0.4 ms   Lead Channel Impedance Value 437 ohm   Lead Channel Impedance Value 361 ohm   Lead Channel Sensing Intrinsic Amplitude 11.375 mV   Lead Channel Sensing Intrinsic Amplitude 11.375 mV   Lead Channel Pacing Threshold Amplitude 0.75 V   Lead Channel Pacing Threshold Pulse Width 0.4 ms   Battery Status OK    Battery Remaining Longevity 39 mo   Battery Voltage 2.98 V   Brady Statistic RA Percent Paced 0 %   Brady Statistic RV Percent Paced 93.75 %   Brady Statistic AP VP Percent 0 %   Brady Statistic AS VP Percent 92.56 %   Brady Statistic AP VS Percent 0 %   Brady Statistic AS VS Percent 7.44 %  SARS CORONAVIRUS 2 Nasal Swab Aptima Multi Swab     Status: None   Collection Time: 02/08/19  6:41 AM   Specimen: Aptima Multi Swab; Nasal Swab  Result Value Ref Range   SARS Coronavirus 2 NEGATIVE NEGATIVE    Comment: (NOTE) SARS-CoV-2 target nucleic acids are NOT DETECTED. The SARS-CoV-2 RNA is generally detectable in upper and lower respiratory specimens during the acute phase of infection. Negative results do not preclude SARS-CoV-2 infection, do  not rule out co-infections with other pathogens, and should not be used as the sole basis for treatment or other patient management decisions. Negative results must be combined with clinical observations, patient history, and epidemiological information. The expected result is Negative. Fact Sheet for Patients: SugarRoll.be Fact Sheet for Healthcare Providers: https://www.woods-mathews.com/ This test is not yet approved or cleared by the [place] FDA and  has been authorized for detection and/or diagnosis of SARS-CoV-2 by FDA under an Emergency Use Authorization (EUA). This EUA will remain  in effect (meaning this test can be used) for the duration of the COVID-19  declaration under Section 56 4(b)(1) of the Act, 21 U.S.C. section 360bbb-3(b)(1), unless the authorization is terminated or revoked sooner. Performed at [facility], [place] [address] [place], [place] [address]   Basic metabolic panel     Status: Abnormal   Collection Time: 02/08/19  9:30 AM  Result Value Ref Range   Sodium 138 135 - 145 mmol/L   Potassium 4.0 3.5 - 5.1 mmol/L   Chloride 101 98 - 111 mmol/L   CO2 25 22 - 32 mmol/L   Glucose, Bld 144 (H) 70 - 99 mg/dL   BUN 22 8 - 23 mg/dL   Creatinine, Ser 1.65 (H) 0.61 - 1.24 mg/dL   Calcium 8.4 (L) 8.9 - 10.3 mg/dL   GFR calc non Af Amer 41 (L) >60 mL/min   GFR calc Af Amer 47 (L) >60 mL/min   Anion gap 12 5 - 15    Comment: Performed at [facility], [address], [place], [place] [address]  Comprehensive metabolic panel     Status: Abnormal   Collection Time: 02/08/19  9:30 AM  Result Value Ref Range   Sodium 139 135 - 145 mmol/L   Potassium 4.1 3.5 - 5.1 mmol/L   Chloride 100 98 - 111 mmol/L   CO2 26 22 - 32 mmol/L   Glucose, Bld 138 (H) 70 - 99 mg/dL   BUN 21 8 - 23 mg/dL   Creatinine, Ser 1.63 (H) 0.61 - 1.24 mg/dL   Calcium 8.5 (L) 8.9 - 10.3 mg/dL   Total Protein 6.4 (L) 6.5 - 8.1 g/dL   Albumin 3.6 3.5 - 5.0 g/dL   AST 19 15 - 41 U/L   ALT 17 0 - 44 U/L   Alkaline Phosphatase 54 38 - 126 U/L   Total Bilirubin 1.0 0.3 - 1.2 mg/dL   GFR calc non Af Amer 41 (L) >60 mL/min   GFR calc Af Amer 48 (L) >60 mL/min   Anion gap 13 5 - 15    Comment: Performed at [facility], [address], [place], [place] [address]  Hemoglobin A1c     Status: Abnormal   Collection Time: 02/08/19  9:30 AM  Result Value Ref Range   Hgb A1c MFr Bld 7.2 (H) 4.8 - 5.6 %    Comment: (NOTE) Pre diabetes:          5.7%-6.4% Diabetes:              >6.4% Glycemic control for   <7.0% adults with diabetes    Mean Plasma Glucose 159.94 mg/dL    Comment: Performed at [facility], [place] [address] [place], [place] [address]   Glucose, capillary     Status: Abnormal   Collection Time: 02/10/19 11:10 AM  Result Value Ref Range   Glucose-Capillary 167 (H) 70 - 99 mg/dL     Assessment & Plan:  1. Type 2 diabetes mellitus with stage 3 chronic kidney disease, without long-term current use of insulin ([place])  - Patient has currently uncontrolled symptomatic type 2 DM since  73 years of age. -His previsit labs show A1c of  7.2%, improving from 9.7%.   -his diabetes is complicated by stage 3 renal insufficiency, obesity/sedentary life, neuropathy, cardiomyopathy, chronic heavy smoking, inadequate insurance and Dylan Tyler remains at a high risk for more acute and chronic complications which include CAD, CVA, CKD, retinopathy, and neuropathy. These are all discussed in detail with the patient.  - I have counseled him on diet management and weight loss, by adopting a carbohydrate restricted/protein rich diet.  - he  admits there is a room for improvement in his diet and drink choices. -  Suggestion is made for him to avoid simple carbohydrates  from his diet including Cakes, Sweet Desserts / Pastries, Ice Cream, Soda (diet and regular), Sweet Tea, Candies, Chips, Cookies, Sweet Pastries,  Store Bought Juices, Alcohol in Excess of  1-2 drinks a day, Artificial Sweeteners, Coffee Creamer, and "Sugar-free" Products. This will help patient to have stable blood glucose profile and potentially avoid unintended weight gain.   - I encouraged him to switch to  unprocessed or minimally processed complex starch and increased protein intake (animal or plant source), fruits, and vegetables.  - he is advised to stick to a routine mealtimes to eat 3 meals  a day and avoid unnecessary snacks ( to snack only to correct hypoglycemia).   - I have approached him with the following individualized plan to manage diabetes and patient agrees:   - Based on his glucose profile, he will continue to need intensive treatment with basal/bolus  insulin in order for him to achieve and maintain control of diabetes to target. -He is advised to continue Lantus at 90 units   at 8 PM every day so that he won't miss  his doses.  -He is advised to continue NovoLog 25 units  units 3 times a day before meals for pre-meal blood glucose readings above 90 mg/dL (with additional correction for readings above 150 mg/dL)  associated with strict monitoring of blood glucose 4 times a day- before meals and at bedtime.  - He is advised to stay committed for proper monitoring for safe use of insulin.  -Patient is encouraged to call clinic for blood glucose levels less than 70 or above 300 mg /dl. - He is not a candidate for metformin therapy due to CKD.  - He  will be considered for incretin therapy when he gets appropriate insurance coverage.   2) Lipids/HPL: He has significant hypertriglyceridemia.  He is advised to continue simvastatin 40 mg p.o. nightly along with his cholestyramine 4 g 3 times a day.     3) Chronic Care/Health Maintenance:  -he  is on ACEI/ARB and Statin medications and  is encouraged to continue to follow up with Ophthalmology, Dentist,  Podiatrist at least yearly or according to recommendations. -He is extensively counseled against smoking. I have recommended yearly flu vaccine and pneumonia vaccination at least every 5 years; and  sleep for at least 7 hours a day.  - I advised patient to maintain close follow up with Glenda Chroman, MD for primary care needs.  - Patient Care Time Today:  25 min, of which >50% was spent in  counseling and the rest reviewing his  current and  previous labs/studies, previous treatments, his blood glucose readings, and medications' doses and  developing a plan for long-term care based on the latest recommendations for standards of care.   Dylan Tyler participated in the discussions, expressed understanding, and voiced agreement with the above plans.  All questions were answered to his satisfaction. he  is encouraged to contact clinic should he have any questions or concerns prior to his return visit.  Follow up plan: - Return in about 4 months (around 06/16/2019), or logs 8, for Follow up with Pre-visit Labs, Meter, and Logs.  Glade Lloyd, MD Phone: [phone]  Fax: [phone]   02/14/2019, 1:40 PM This note was partially dictated with voice recognition software. Similar sounding words can be transcribed inadequately or may not  be corrected upon review.

## 2019-02-20 DIAGNOSIS — H25811 Combined forms of age-related cataract, right eye: Secondary | ICD-10-CM | POA: Diagnosis not present

## 2019-02-21 ENCOUNTER — Other Ambulatory Visit: Payer: Self-pay

## 2019-02-21 ENCOUNTER — Encounter (HOSPITAL_COMMUNITY)
Admission: RE | Admit: 2019-02-21 | Discharge: 2019-02-21 | Disposition: A | Discharge status: Home / Self Care | Payer: PPO | Source: Ambulatory Visit | Attending: Ophthalmology | Admitting: Ophthalmology

## 2019-02-21 NOTE — H&P (Signed)
Surgical History & Physical   Patient Name: Dylan Tyler DOB: September 27, 1945  Surgery: Cataract extraction with intraocular lens implant phacoemulsification; Right Eye  Surgeon: Baruch Goldmann MD Surgery Date:  02/27/2019 Pre-Op Date:  02/16/2019  HPI: A 74 Yr. old male patient 1. 1. The patient is returning after cataract surgery. The left eye is affected. Status post cataract surgery: Onset was S/P 1 week 02/10/19. Since the last visit, the affected area feels improvement. Patient is following medication instructions 3 in 1 drops TID as directed. Patient can see better since surgery OS yet still slightly blurred. OD very blurred c/w OS now. This is negatively affecting the patient's quality of life. Ready to proceed with OD for OU to be working together clearly. OD constantly cloudy. HPI was performed by Baruch Goldmann .  Medical History: Cataracts Hypertension Retinopathy Anxiety DM neuropathy Arthritis Diabetes Heart Problem High Blood Pressure LDL Lung Problems  Review of Systems Respiratory COPD, Shortness of Breath All recorded systems are negative except as noted above.  Social   Current every day smoker  Medication Prednisolon-gatiflox-bromfenac,  Ibuprofen, NovoLog, Duoneb, Lantus, Metoclopramide, Nitroglycerin, Simvastatin, Vitamin B-12, Vitamin D3, Voltaren, Proventil, Eliquis, Carvedilol, Cholstyramine, Citalopram, Gabapentin,   Sx/Procedures Phaco c IOL,  Pacemaker, stent, Gallbladder Removal, Left rotator cuff repair,   Drug Allergies   NKDA  History & Physical: Heent:  Cataract, Right eye NECK: supple without bruits LUNGS: lungs clear to auscultation CV: regular rate and rhythm Abdomen: soft and non-tender Impression & Plan: Assessment: 1.  CATARACT EXTRACTION STATUS; Left Eye (Z98.42) 2.  COMBINED FORMS AGE RELATED CATARACT; Right Eye (H25.811)  Plan: 1.  1 week after cataract surgery. Doing well with improved vision and normal eye pressure. Call with any  problems or concerns. Continue Gati-Brom-Pred 2x/day for 3 more weeks. 2.  Malyugin Ring - 29mm Cataract accounts for the patient's decreased vision. This visual impairment is not correctable with a tolerable change in glasses or contact lenses. Cataract surgery with an implantation of a new lens should significantly improve the visual and functional status of the patient. Discussed all risks, benefits, alternatives, and potential complications. Discussed the procedures and recovery. Patient desires to have surgery. A-scan ordered and performed today for intra-ocular lens calculations. The surgery will be performed in order to improve vision for driving, reading, and for eye examinations. Recommend phacoemulsification with intra-ocular lens. Right Eye. Surgery required to correct imbalance of vision. Dilates poorly - shugacaine by protocol.

## 2019-02-23 ENCOUNTER — Other Ambulatory Visit: Payer: Self-pay

## 2019-02-23 ENCOUNTER — Other Ambulatory Visit (HOSPITAL_COMMUNITY)
Admission: RE | Admit: 2019-02-23 | Discharge: 2019-02-23 | Disposition: A | Discharge status: Home / Self Care | Payer: PPO | Source: Ambulatory Visit | Attending: Ophthalmology | Admitting: Ophthalmology

## 2019-02-23 DIAGNOSIS — Z20828 Contact with and (suspected) exposure to other viral communicable diseases: Secondary | ICD-10-CM | POA: Diagnosis not present

## 2019-02-23 DIAGNOSIS — Z01812 Encounter for preprocedural laboratory examination: Secondary | ICD-10-CM | POA: Diagnosis not present

## 2019-02-23 LAB — SARS CORONAVIRUS 2 (TAT 6-24 HRS): SARS Coronavirus 2: NEGATIVE

## 2019-02-24 ENCOUNTER — Other Ambulatory Visit: Payer: Self-pay

## 2019-02-24 ENCOUNTER — Encounter (HOSPITAL_COMMUNITY): Payer: Self-pay

## 2019-02-27 ENCOUNTER — Ambulatory Visit (HOSPITAL_COMMUNITY): Payer: PPO | Admitting: Anesthesiology

## 2019-02-27 ENCOUNTER — Encounter (HOSPITAL_COMMUNITY)
Admission: RE | Disposition: A | Discharge status: Home / Self Care | Payer: Self-pay | Source: Home / Self Care | Attending: Ophthalmology

## 2019-02-27 ENCOUNTER — Ambulatory Visit (HOSPITAL_COMMUNITY)
Admission: RE | Admit: 2019-02-27 | Discharge: 2019-02-27 | Disposition: A | Discharge status: Home / Self Care | Payer: PPO | Attending: Ophthalmology | Admitting: Ophthalmology

## 2019-02-27 ENCOUNTER — Encounter (HOSPITAL_COMMUNITY): Payer: Self-pay | Admitting: Anesthesiology

## 2019-02-27 ENCOUNTER — Other Ambulatory Visit: Payer: Self-pay

## 2019-02-27 DIAGNOSIS — Z79899 Other long term (current) drug therapy: Secondary | ICD-10-CM | POA: Diagnosis not present

## 2019-02-27 DIAGNOSIS — E1136 Type 2 diabetes mellitus with diabetic cataract: Secondary | ICD-10-CM | POA: Diagnosis not present

## 2019-02-27 DIAGNOSIS — F329 Major depressive disorder, single episode, unspecified: Secondary | ICD-10-CM | POA: Insufficient documentation

## 2019-02-27 DIAGNOSIS — I509 Heart failure, unspecified: Secondary | ICD-10-CM | POA: Insufficient documentation

## 2019-02-27 DIAGNOSIS — Z9849 Cataract extraction status, unspecified eye: Secondary | ICD-10-CM | POA: Insufficient documentation

## 2019-02-27 DIAGNOSIS — E1140 Type 2 diabetes mellitus with diabetic neuropathy, unspecified: Secondary | ICD-10-CM | POA: Insufficient documentation

## 2019-02-27 DIAGNOSIS — I110 Hypertensive heart disease with heart failure: Secondary | ICD-10-CM | POA: Insufficient documentation

## 2019-02-27 DIAGNOSIS — Z950 Presence of cardiac pacemaker: Secondary | ICD-10-CM | POA: Insufficient documentation

## 2019-02-27 DIAGNOSIS — F419 Anxiety disorder, unspecified: Secondary | ICD-10-CM | POA: Insufficient documentation

## 2019-02-27 DIAGNOSIS — M1990 Unspecified osteoarthritis, unspecified site: Secondary | ICD-10-CM | POA: Diagnosis not present

## 2019-02-27 DIAGNOSIS — Z794 Long term (current) use of insulin: Secondary | ICD-10-CM | POA: Insufficient documentation

## 2019-02-27 DIAGNOSIS — Z791 Long term (current) use of non-steroidal anti-inflammatories (NSAID): Secondary | ICD-10-CM | POA: Diagnosis not present

## 2019-02-27 DIAGNOSIS — F17200 Nicotine dependence, unspecified, uncomplicated: Secondary | ICD-10-CM | POA: Insufficient documentation

## 2019-02-27 DIAGNOSIS — K219 Gastro-esophageal reflux disease without esophagitis: Secondary | ICD-10-CM | POA: Insufficient documentation

## 2019-02-27 DIAGNOSIS — H259 Unspecified age-related cataract: Secondary | ICD-10-CM | POA: Diagnosis not present

## 2019-02-27 DIAGNOSIS — Z961 Presence of intraocular lens: Secondary | ICD-10-CM | POA: Insufficient documentation

## 2019-02-27 DIAGNOSIS — Z9049 Acquired absence of other specified parts of digestive tract: Secondary | ICD-10-CM | POA: Insufficient documentation

## 2019-02-27 DIAGNOSIS — J449 Chronic obstructive pulmonary disease, unspecified: Secondary | ICD-10-CM | POA: Insufficient documentation

## 2019-02-27 DIAGNOSIS — H25811 Combined forms of age-related cataract, right eye: Secondary | ICD-10-CM | POA: Diagnosis not present

## 2019-02-27 DIAGNOSIS — H35039 Hypertensive retinopathy, unspecified eye: Secondary | ICD-10-CM | POA: Diagnosis not present

## 2019-02-27 DIAGNOSIS — H2511 Age-related nuclear cataract, right eye: Secondary | ICD-10-CM | POA: Diagnosis not present

## 2019-02-27 DIAGNOSIS — I10 Essential (primary) hypertension: Secondary | ICD-10-CM | POA: Diagnosis not present

## 2019-02-27 DIAGNOSIS — G4730 Sleep apnea, unspecified: Secondary | ICD-10-CM | POA: Insufficient documentation

## 2019-02-27 DIAGNOSIS — R51 Headache: Secondary | ICD-10-CM | POA: Insufficient documentation

## 2019-02-27 DIAGNOSIS — Z7901 Long term (current) use of anticoagulants: Secondary | ICD-10-CM | POA: Diagnosis not present

## 2019-02-27 HISTORY — PX: CATARACT EXTRACTION W/PHACO: SHX586

## 2019-02-27 LAB — GLUCOSE, CAPILLARY: Glucose-Capillary: 103 mg/dL — ABNORMAL HIGH (ref 70–99)

## 2019-02-27 SURGERY — PHACOEMULSIFICATION, CATARACT, WITH IOL INSERTION
Anesthesia: Monitor Anesthesia Care | Site: Eye | Laterality: Right

## 2019-02-27 MED ORDER — PHENYLEPHRINE HCL 2.5 % OP SOLN
1.0000 [drp] | OPHTHALMIC | Status: AC | PRN
  Administered 2019-02-27 (×3): 1 [drp] via OPHTHALMIC

## 2019-02-27 MED ORDER — SODIUM HYALURONATE 23 MG/ML IO SOLN
INTRAOCULAR | Status: DC | PRN
  Administered 2019-02-27: 0.6 mL via INTRAOCULAR

## 2019-02-27 MED ORDER — PROVISC 10 MG/ML IO SOLN
INTRAOCULAR | Status: DC | PRN
  Administered 2019-02-27: 0.85 mL via INTRAOCULAR

## 2019-02-27 MED ORDER — NEOMYCIN-POLYMYXIN-DEXAMETH 3.5-10000-0.1 OP SUSP
OPHTHALMIC | Status: DC | PRN
  Administered 2019-02-27: 1 [drp] via OPHTHALMIC

## 2019-02-27 MED ORDER — BSS IO SOLN
INTRAOCULAR | Status: DC | PRN
  Administered 2019-02-27: 15 mL

## 2019-02-27 MED ORDER — LIDOCAINE HCL (PF) 1 % IJ SOLN
INTRAOCULAR | Status: DC | PRN
  Administered 2019-02-27: 1 mL via OPHTHALMIC

## 2019-02-27 MED ORDER — TETRACAINE HCL 0.5 % OP SOLN
1.0000 [drp] | OPHTHALMIC | Status: AC | PRN
  Administered 2019-02-27 (×3): 1 [drp] via OPHTHALMIC

## 2019-02-27 MED ORDER — POVIDONE-IODINE 5 % OP SOLN
OPHTHALMIC | Status: DC | PRN
  Administered 2019-02-27: 1 via OPHTHALMIC

## 2019-02-27 MED ORDER — LIDOCAINE HCL 3.5 % OP GEL
1.0000 "application " | Freq: Once | OPHTHALMIC | Status: AC
  Administered 2019-02-27: 1 via OPHTHALMIC

## 2019-02-27 MED ORDER — EPINEPHRINE PF 1 MG/ML IJ SOLN
INTRAOCULAR | Status: DC | PRN
  Administered 2019-02-27: 500 mL

## 2019-02-27 MED ORDER — CYCLOPENTOLATE-PHENYLEPHRINE 0.2-1 % OP SOLN
1.0000 [drp] | OPHTHALMIC | Status: AC | PRN
  Administered 2019-02-27 (×3): 1 [drp] via OPHTHALMIC

## 2019-02-27 SURGICAL SUPPLY — 13 items

## 2019-02-27 NOTE — Anesthesia Preprocedure Evaluation (Signed)
Anesthesia Evaluation  Patient identified by MRN, date of birth, ID band Patient awake    Reviewed: Allergy & Precautions, NPO status , Patient's Chart, lab work & pertinent test results, reviewed documented beta blocker date and time   Airway Mallampati: III       Dental  (+) Edentulous Upper, Edentulous Lower   Pulmonary sleep apnea , COPD, Current Smoker,    breath sounds clear to auscultation       Cardiovascular hypertension, +CHF (EF- 30%)  + dysrhythmias + pacemaker  Rhythm:Regular Rate:Normal     Neuro/Psych  Headaches, PSYCHIATRIC DISORDERS Anxiety Depression    GI/Hepatic GERD  ,  Endo/Other  diabetes, Well Controlled, Type 2  Renal/GU Renal disease     Musculoskeletal  (+) Arthritis ,   Abdominal   Peds  Hematology   Anesthesia Other Findings   Reproductive/Obstetrics                             Anesthesia Physical Anesthesia Plan  ASA: IV  Anesthesia Plan: MAC   Post-op Pain Management:    Induction:   PONV Risk Score and Plan:   Airway Management Planned: Nasal Cannula  Additional Equipment:   Intra-op Plan:   Post-operative Plan:   Informed Consent: I have reviewed the patients History and Physical, chart, labs and discussed the procedure including the risks, benefits and alternatives for the proposed anesthesia with the patient or authorized representative who has indicated his/her understanding and acceptance.       Plan Discussed with: CRNA  Anesthesia Plan Comments:         Anesthesia Quick Evaluation

## 2019-02-27 NOTE — Discharge Instructions (Signed)
Please discharge patient when stable, will follow up today with Dr. Taysen Bushart at the [facility] office immediately following discharge.  Leave shield in place until visit.  All paperwork with discharge instructions will be given at the office. ° °

## 2019-02-27 NOTE — Interval H&P Note (Signed)
History and Physical Interval Note: The H and P was reviewed and updated. The patient was examined.  No changes were found after exam.  The surgical eye was marked.  02/27/2019 10:09 AM  Dylan Tyler  has presented today for surgery, with the diagnosis of Nuclear sclerotic cataract - Right eye.  The various methods of treatment have been discussed with the patient and family. After consideration of risks, benefits and other options for treatment, the patient has consented to  Procedure(s) with comments: CATARACT EXTRACTION PHACO AND INTRAOCULAR LENS PLACEMENT (I[facility]) (Right) - right as a surgical intervention.  The patient's history has been reviewed, patient examined, no change in status, stable for surgery.  I have reviewed the patient's chart and labs.  Questions were answered to the patient's satisfaction.     Baruch Goldmann

## 2019-02-27 NOTE — Op Note (Signed)
Date of procedure: 02/27/19  Pre-operative diagnosis: Visually significant age-related cataract, Right Eye (H25.811)  Post-operative diagnosis: Visually significant age-related cataract, Right Eye  Procedure: Removal of cataract via phacoemulsification and insertion of intra-ocular lens [employer] PCB00  +21.0D into the capsular bag of the Right Eye  Attending surgeon: Gerda Diss. Bowyn Mercier, MD, MA  Anesthesia: MAC, Topical Akten  Complications: None  Estimated Blood Loss: <60m (minimal)  Specimens: None  Implants: As above  Indications:  Visually significant age-related cataract, Right Eye  Procedure:  The patient was seen and identified in the pre-operative area. The operative eye was identified and dilated.  The operative eye was marked.  Topical anesthesia was administered to the operative eye.     The patient was then to the operative suite and placed in the supine position.  A timeout was performed confirming the patient, procedure to be performed, and all other relevant information.   The patient's face was prepped and draped in the usual fashion for intra-ocular surgery.  A lid speculum was placed into the operative eye and the surgical microscope moved into place and focused.  A superotemporal paracentesis was created using a 20 gauge paracentesis blade.  Shugarcaine was injected into the anterior chamber.  Viscoelastic was injected into the anterior chamber.  A temporal clear-corneal main wound incision was created using a 2.491mmicrokeratome.  A continuous curvilinear capsulorrhexis was initiated using an irrigating cystitome and completed using capsulorrhexis forceps.  Hydrodissection and hydrodeliniation were performed.  Viscoelastic was injected into the anterior chamber.  A phacoemulsification handpiece and a chopper as a second instrument were used to remove the nucleus and epinucleus. The irrigation/aspiration handpiece was used to remove any remaining cortical  material.   The capsular bag was reinflated with viscoelastic, checked, and found to be intact.  The intraocular lens was inserted into the capsular bag.  The irrigation/aspiration handpiece was used to remove any remaining viscoelastic.  The clear corneal wound and paracentesis wounds were then hydrated and checked with Weck-Cels to be watertight.  The lid-speculum and drape was removed, and the patient's face was cleaned with a wet and dry 4x4.  Maxitrol was instilled in the eye before a clear shield was taped over the eye. The patient was taken to the post-operative care unit in good condition, having tolerated the procedure well.  Post-Op Instructions: The patient will follow up at [facility] a same day post-operative evaluation and will receive all other orders and instructions.

## 2019-02-27 NOTE — Transfer of Care (Signed)
Immediate Anesthesia Transfer of Care Note  Patient: Dylan Tyler  Procedure(s) Performed: CATARACT EXTRACTION PHACO AND INTRAOCULAR LENS PLACEMENT RIGHT EYE CDE=11.84 (Right Eye)  Patient Location: Short Stay  Anesthesia Type:MAC  Level of Consciousness: awake  Airway & Oxygen Therapy: Patient Spontanous Breathing  Post-op Assessment: Report given to RN  Post vital signs: Reviewed  Last Vitals:  Vitals Value Taken Time  BP    Temp    Pulse    Resp    SpO2      Last Pain: There were no vitals filed for this visit.       Complications: No apparent anesthesia complications

## 2019-02-27 NOTE — Progress Notes (Signed)
Pt had iv placed in preop to left hand. Iv taken out with tip intact in post op. Nad. No ss of infection. nad

## 2019-02-27 NOTE — Anesthesia Postprocedure Evaluation (Signed)
Anesthesia Post Note  Patient: Dylan Tyler  Procedure(s) Performed: CATARACT EXTRACTION PHACO AND INTRAOCULAR LENS PLACEMENT RIGHT EYE CDE=11.84 (Right Eye)  Patient location during evaluation: PACU Anesthesia Type: MAC Level of consciousness: awake and alert and oriented Pain management: pain level controlled Vital Signs Assessment: post-procedure vital signs reviewed and stable Respiratory status: spontaneous breathing Cardiovascular status: blood pressure returned to baseline and stable Postop Assessment: no apparent nausea or vomiting Anesthetic complications: no     Last Vitals: There were no vitals filed for this visit.  Last Pain: There were no vitals filed for this visit.               Hanford Lust

## 2019-02-28 ENCOUNTER — Encounter (HOSPITAL_COMMUNITY): Payer: Self-pay | Admitting: Ophthalmology

## 2019-03-01 ENCOUNTER — Telehealth: Payer: Self-pay | Admitting: *Deleted

## 2019-03-01 ENCOUNTER — Other Ambulatory Visit: Payer: Self-pay | Admitting: Internal Medicine

## 2019-03-01 DIAGNOSIS — Z79899 Other long term (current) drug therapy: Secondary | ICD-10-CM

## 2019-03-01 NOTE — Telephone Encounter (Signed)
Pt.notified

## 2019-03-01 NOTE — Telephone Encounter (Signed)
-----   Message from Brittany M Strader, PA-C sent at 02/08/2019 10:52 AM EDT ----- Please let the patient know his lab work showed stable electrolytes. Renal function has slightly worsened. Was 1.52 in 12/2018, now at 1.65. Was previously 1.5 - 1.7 last year. Given his significant edema at the time of his recent office visit, would continue his Lasix at current dosing for now and repeat a BMET in 2-3 weeks for reassessment. If creatinine continues to trend upwards, would need to reduce Lasix at that time. Please forward a copy of results to Vyas, Dhruv B, MD. 

## 2019-03-20 ENCOUNTER — Other Ambulatory Visit: Payer: Self-pay

## 2019-03-20 ENCOUNTER — Telehealth: Payer: Self-pay | Admitting: Cardiology

## 2019-03-20 ENCOUNTER — Ambulatory Visit (INDEPENDENT_AMBULATORY_CARE_PROVIDER_SITE_OTHER): Payer: PPO | Admitting: Cardiology

## 2019-03-20 ENCOUNTER — Encounter: Payer: Self-pay | Admitting: Cardiology

## 2019-03-20 VITALS — BP 131/74 | HR 77 | Ht 69.0 in | Wt 255.6 lb

## 2019-03-20 DIAGNOSIS — I4821 Permanent atrial fibrillation: Secondary | ICD-10-CM

## 2019-03-20 DIAGNOSIS — I10 Essential (primary) hypertension: Secondary | ICD-10-CM

## 2019-03-20 DIAGNOSIS — I5042 Chronic combined systolic (congestive) and diastolic (congestive) heart failure: Secondary | ICD-10-CM | POA: Diagnosis not present

## 2019-03-20 DIAGNOSIS — I429 Cardiomyopathy, unspecified: Secondary | ICD-10-CM

## 2019-03-20 NOTE — Telephone Encounter (Signed)
°  Precert needed for: echo - cardiomyopathy   Location: [facility]     Date: Apr 13, 2019

## 2019-03-20 NOTE — Progress Notes (Signed)
Cardiology Office Note  Date: 03/20/2019   ID: Tyler, Dylan 13-Sep-1945, MRN [id number]  PCP:  Glenda Chroman, MD  Cardiologist:  Rozann Lesches, MD Electrophysiologist:  Thompson Grayer, MD   Chief Complaint  Patient presents with  . Cardiac follow-up    History of Present Illness: Dylan Tyler is a 73 y.o. male last seen in July by [name] PA-C.  He presents for a follow-up visit.  Reports no major change in stamina, no worsening shortness of breath or chest pain.  Still has leg swelling but it has gradually improved.  Lasix was increased to 40 mg daily at the last visit.  His weight is down 1 to 2 pounds.  He sees Dylan Tyler in the device clinic, Medtronic pacemaker in place.  Patient is RV pacing 94% of the time by last interrogation.  He does not report any palpitations or syncope.  Follow-up lab work in August showed creatinine 1.65.  He has had renal insufficiency in this range previously.  I reviewed his medications and he is otherwise tolerating Entresto.  Past Medical History:  Diagnosis Date  . Ankylosing spondylitis (Dylan Tyler)   . Anxiety   . Aortic root dilatation (HCC)    Mild  . AR (aortic regurgitation)    Mild  . Atrial fibrillation and flutter (Dylan Tyler)   . Bladder cancer (Dylan Tyler)   . Chronic pain    Followed at [facility]  . COPD (chronic obstructive pulmonary disease) (Dylan Tyler)   . DDD (degenerative disc disease), lumbosacral   . DDD (degenerative disc disease), thoracolumbar   . Degenerative cervical disc   . Depression   . Diabetes mellitus type II   . Diverticulitis   . Essential hypertension   . GERD (gastroesophageal reflux disease)   . History of kidney stones   . Hyperlipidemia   . Kidney stones   . Migraine   . Nonischemic cardiomyopathy (Dylan Tyler)    Cardiolite 12/09 LVEF 55%, minor luminal irregularities at cardiac catheterization  . OSA on CPAP    doesnt use any more, needs new machine and hasnt gotten one.  . Presence of permanent cardiac  pacemaker   . Rheumatoid arthritis (Dylan Tyler)   . Symptomatic bradycardia    Medtronic Advisa L dual-chamber pacemaker  05/2015 - Dr. Curt Tyler    Past Surgical History:  Procedure Laterality Date  . CARDIAC CATHETERIZATION  08/13/11  . CARDIOVERSION N/A 07/02/2015   Procedure: CARDIOVERSION;  Surgeon: Dylan Klein, MD;  Location: [facility];  Service: Cardiovascular;  Laterality: N/A;  . CATARACT EXTRACTION W/PHACO Left 02/10/2019   Procedure: CATARACT EXTRACTION PHACO AND INTRAOCULAR LENS PLACEMENT ([place]);  Surgeon: Dylan Goldmann, MD;  Location: [facility];  Service: Ophthalmology;  Laterality: Left;  CDE: 7.10  . CATARACT EXTRACTION W/PHACO Right 02/27/2019   Procedure: CATARACT EXTRACTION PHACO AND INTRAOCULAR LENS PLACEMENT RIGHT EYE CDE=11.84;  Surgeon: Dylan Goldmann, MD;  Location: [facility];  Service: Ophthalmology;  Laterality: Right;  right  . CYSTOSCOPY WITH HOLMIUM LASER LITHOTRIPSY  1990's  . EP IMPLANTABLE DEVICE N/A 05/21/2015   MDT Advisa DR MRI pacemaker implanted by Dr Dylan Tyler  . KNEE ARTHROSCOPY Right ~ 1991  . LAPAROSCOPIC CHOLECYSTECTOMY  ~ 2008  . TRANSURETHRAL RESECTION OF BLADDER TUMOR WITH GYRUS (TURBT-GYRUS)  1990's?    Current Outpatient Medications  Medication Sig Dispense Refill  . acetaminophen (TYLENOL) 500 MG tablet Take 500 mg by mouth every 6 (six) hours as needed (pain).     Marland Kitchen albuterol (PROVENTIL) (2.5 MG/3ML)  0.083% nebulizer solution Take 2.5 mg by nebulization every 6 (six) hours as needed for wheezing.    . Alcohol Swabs 70 % PADS Use as directed 120 each 2  . ALPRAZolam (XANAX) 0.5 MG tablet Take 0.5 mg by mouth 2 (two) times daily as needed.  1  . BD PEN NEEDLE NANO U/F 32G X 4 MM MISC USE ONE AS DIRECTED - 4 injections per day 150 each 5  . carvedilol (COREG) 6.25 MG tablet TAKE ONE TABLET BY MOUTH TWICE DAILY. TAKE WITH A MEAL. (Patient taking differently: Take 6.25 mg by mouth 2 (two) times daily with a meal. ) 60 tablet 6  . Cholecalciferol (VITAMIN D-3)  1000 UNITS CAPS Take 1,000 Units by mouth 2 (two) times daily.     . cholestyramine (QUESTRAN) 4 g packet Take 4 g by mouth 3 (three) times daily with meals.    . citalopram (CELEXA) 40 MG tablet Take 20-40 mg by mouth daily.     . Continuous Blood Gluc Receiver (FREESTYLE LIBRE 14 DAY READER) DEVI USE AS DIRECTED. 1 each 0  . Continuous Blood Gluc Sensor (FREESTYLE LIBRE 14 DAY SENSOR) MISC 1 each by Does not apply route every 14 (fourteen) days. 6 each 2  . ELIQUIS 5 MG TABS tablet TAKE ONE TABLET BY MOUTH TWICE DAILY 60 tablet 2  . FREESTYLE LITE test strip USE TO CHECK BLOOD SUGAR FOUR TIMES DAILY. 100 each 5  . furosemide (LASIX) 40 MG tablet Take 1 tablet (40 mg total) by mouth daily. 90 tablet 1  . gabapentin (NEURONTIN) 400 MG capsule Take 400 mg by mouth 3 (three) times daily.     Marland Kitchen ibuprofen (ADVIL,MOTRIN) 200 MG tablet Take 400 mg by mouth every 6 (six) hours as needed for headache or moderate pain.    . Insulin Glargine (LANTUS SOLOSTAR) 100 UNIT/ML Solostar Pen INJECT 90 UNITS INTO SKIN DAILY AT 10PM. 30 mL 4  . Insulin Pen Needle (B-D ULTRAFINE III SHORT PEN) 31G X 8 MM MISC Use to inject insulin 4 x daily 200 each 3  . ipratropium-albuterol (DUONEB) 0.5-2.5 (3) MG/3ML SOLN Take 3 mLs by nebulization every 4 (four) hours as needed (for shortness of breath).    . Lancets (FREESTYLE) lancets USE TO CHECK BLOOD SUGAR FOUR TIMES DAILY. 200 each 5  . metoCLOPramide (REGLAN) 5 MG tablet Take 5 mg by mouth every 6 (six) hours as needed for nausea or vomiting.     . nitroGLYCERIN (NITROSTAT) 0.4 MG SL tablet DISSOLVE ONE TABLET UNDER TONGUE EVERY 5 MINUTES UP TO 3 DOSES AS NEEDED FOR CHEST PAIN. IF no RELIEF AFTER 3 doses, proceed TO THE ER (Patient taking differently: Place 0.4 mg under the tongue every 5 (five) minutes as needed for chest pain. ) 25 tablet 3  . NON FORMULARY 1 each by Other route See admin instructions. CPAP - Uses nightly    . NOVOLOG FLEXPEN 100 UNIT/ML FlexPen INJECT  25-31 UNITS SUBCUTANEOUSLY THREE TIMES DAILY AS DIRECTED. TAKE WITH MEALS. (Patient taking differently: Inject 25-31 Units into the skin 3 (three) times daily with meals. Per sliding scale) 30 mL 2  . potassium chloride (K-DUR) 10 MEQ tablet Take 1 tablet (10 mEq total) by mouth daily. 90 tablet 1  . sacubitril-valsartan (ENTRESTO) 24-26 MG Take 1 tablet by mouth 2 (two) times daily. 60 tablet 6  . simvastatin (ZOCOR) 40 MG tablet TAKE ONE TABLET BY MOUTH AT BEDTIME 90 tablet 3  . traMADol (ULTRAM) 50 MG tablet  Take 1 tablet by mouth daily as needed (pain).     . TRELEGY ELLIPTA 100-62.5-25 MCG/INH AEPB Inhale 1 puff into the lungs daily.    . vitamin B-12 (CYANOCOBALAMIN) 1000 MCG tablet Take 1,000 mcg by mouth 2 (two) times daily.      . VOLTAREN 1 % GEL Apply 2 g topically 4 (four) times daily as needed (for pain).      No current facility-administered medications for this visit.    Allergies:  Patient has no known allergies.   Social History: The patient  reports that he has been smoking e-cigarettes. He started smoking about 60 years ago. He has a 15.00 pack-year smoking history. He has quit using smokeless tobacco.  His smokeless tobacco use included chew. He reports current alcohol use of about 2.0 standard drinks of alcohol per week. He reports current drug use. Drug: Cocaine.   ROS:  Please see the history of present illness. Otherwise, complete review of systems is positive for chronic knee pain, uses a cane.  All other systems are reviewed and negative.   Physical Exam: VS:  BP 131/74   Pulse 77   Ht 5\' 9"  (1.753 m)   Wt 255 lb 9.6 oz (115.9 kg)   SpO2 95%   BMI 37.75 kg/m , BMI Body mass index is 37.75 kg/m.  Wt Readings from Last 3 Encounters:  03/20/19 255 lb 9.6 oz (115.9 kg)  02/08/19 257 lb (116.6 kg)  01/19/19 256 lb (116.1 kg)    General: Patient appears comfortable at rest.  Using a cane. HEENT: Conjunctiva and lids normal, wearing a mask. Neck: Supple, no  elevated JVP or carotid bruits, no thyromegaly. Lungs: Clear to auscultation, nonlabored breathing at rest. Cardiac: Regular rate and rhythm, no S3, soft systolic murmur. Abdomen: Soft, nontender, bowel sounds present. Extremities: 2+ lower leg edema, distal pulses 2+. Skin: Warm and dry. Musculoskeletal: No kyphosis. Neuropsychiatric: Alert and oriented x3, affect grossly appropriate.  ECG:  An ECG dated 01/19/2019 was personally reviewed today and demonstrated:  Ventricular paced rhythm with underlying atrial fibrillation.  Recent Labwork: 02/08/2019: ALT 17; AST 19; BUN 22; BUN 21; Creatinine, Ser 1.65; Creatinine, Ser 1.63; Potassium 4.0; Potassium 4.1; Sodium 138; Sodium 139     Component Value Date/Time   CHOL 111 12/06/2017   TRIG 423 (A) 12/06/2017   HDL 33 (A) 12/06/2017    Other Studies Reviewed Today:  Echocardiogram 12/12/2018 [facility]): Mild to moderate LVH with LVEF approximately 30 to 40% with global hypokinesis, mildly dilated right ventricle with device wire present, moderate left atrial enlargement, mild aortic regurgitation, mild mitral regurgitation, mild tricuspid regurgitation, RVSP estimated 33 mmHg.  Assessment and Plan:  1.  Nonischemic cardiomyopathy, LVEF 30 to 40% range by echocardiogram at [facility] in June.  Medications have been adjusted, now on Coreg, Entresto, and Lasix with potassium supplements.  Plan to obtain a follow-up echocardiogram and make adjustments from there.  We may still be able to add Aldactone with close follow-up of renal function.  2.  Symptomatic bradycardia status post Medtronic pacemaker.  Patient has permanent atrial fibrillation/flutter and does not report any palpitations, on Eliquis for stroke prophylaxis.  He follows with Dylan Tyler.  RV pacing 94% of the time by most recent device interrogation, question whether potential contributor to his cardiomyopathy.  3.  Essential hypertension, systolic is in the  419Q today.  No changes made as yet.  4.  CKD stage III, most recent creatinine 1.65.  Medication Adjustments/Labs and Tests Ordered: Current medicines are reviewed at length with the patient today.  Concerns regarding medicines are outlined above.   Tests Ordered: Orders Placed This Encounter  Procedures  . ECHOCARDIOGRAM COMPLETE    Medication Changes: No orders of the defined types were placed in this encounter.   Disposition:  Follow up 6 to 8 weeks in the [place] office.  Signed, [name], MD, [facility] 03/20/2019 4:06 PM    [facility] [place], [place],  [address] Phone: [phone]; Fax: [phone]

## 2019-03-20 NOTE — Patient Instructions (Signed)
Medication Instructions:  Continue all current medications.  Labwork: none  Testing/Procedures:  Your physician has requested that you have an echocardiogram. Echocardiography is a painless test that uses sound waves to create images of your heart. It provides your doctor with information about the size and shape of your heart and how well your heart's chambers and valves are working. This procedure takes approximately one hour. There are no restrictions for this procedure.  Office will contact with results via phone or letter.    Follow-Up: 6-8 weeks   Any Other Special Instructions Will Be Listed Below (If Applicable).  If you need a refill on your cardiac medications before your next appointment, please call your pharmacy.  

## 2019-03-31 ENCOUNTER — Other Ambulatory Visit: Payer: Self-pay | Admitting: "Endocrinology

## 2019-04-13 ENCOUNTER — Other Ambulatory Visit: Payer: Self-pay

## 2019-04-13 ENCOUNTER — Ambulatory Visit (INDEPENDENT_AMBULATORY_CARE_PROVIDER_SITE_OTHER): Payer: PPO

## 2019-04-13 DIAGNOSIS — I429 Cardiomyopathy, unspecified: Secondary | ICD-10-CM

## 2019-04-14 ENCOUNTER — Telehealth: Payer: Self-pay | Admitting: *Deleted

## 2019-04-14 DIAGNOSIS — I5042 Chronic combined systolic (congestive) and diastolic (congestive) heart failure: Secondary | ICD-10-CM

## 2019-04-14 MED ORDER — SPIRONOLACTONE 25 MG PO TABS: 12.5000 mg | ORAL_TABLET | Freq: Every day | ORAL | 2 refills | Status: DC

## 2019-04-14 NOTE — Telephone Encounter (Signed)
-----   Message from [name], MD sent at 04/14/2019  8:01 AM EDT ----- Results reviewed.  No definite change in LVEF as yet, 35 to 40% range.  Let's start Aldactone 12.5 mg daily with follow-up BMET in 7 to 10 days.  He should already have an office visit scheduled as well.

## 2019-04-14 NOTE — Telephone Encounter (Signed)
Patient informed and verbalized understanding of plan. Copy sent to PCP 

## 2019-04-20 DIAGNOSIS — I5042 Chronic combined systolic (congestive) and diastolic (congestive) heart failure: Secondary | ICD-10-CM | POA: Diagnosis not present

## 2019-04-25 ENCOUNTER — Other Ambulatory Visit: Payer: Self-pay | Admitting: *Deleted

## 2019-04-25 ENCOUNTER — Telehealth: Payer: Self-pay | Admitting: *Deleted

## 2019-04-25 DIAGNOSIS — I5042 Chronic combined systolic (congestive) and diastolic (congestive) heart failure: Secondary | ICD-10-CM

## 2019-04-25 MED ORDER — FUROSEMIDE 40 MG PO TABS: ORAL_TABLET | ORAL | 1 refills | Status: DC

## 2019-04-25 NOTE — Telephone Encounter (Signed)
-----   Message from [name], MD sent at 04/21/2019  8:59 AM EDT ----- Results reviewed.  Creatinine and potassium have bumped up with recent medication changes.  Would stop all potassium supplements.  Cut Lasix to 40 mg alternating with 20 mg every other day.  Repeat BMET in 7 days.

## 2019-04-25 NOTE — Telephone Encounter (Signed)
Patient and wife informed and verbalized understanding of plan. Lab order faxed to [facility]

## 2019-04-28 DIAGNOSIS — Z961 Presence of intraocular lens: Secondary | ICD-10-CM | POA: Diagnosis not present

## 2019-05-03 DIAGNOSIS — I5042 Chronic combined systolic (congestive) and diastolic (congestive) heart failure: Secondary | ICD-10-CM | POA: Diagnosis not present

## 2019-05-04 ENCOUNTER — Ambulatory Visit (INDEPENDENT_AMBULATORY_CARE_PROVIDER_SITE_OTHER): Payer: PPO | Admitting: *Deleted

## 2019-05-04 ENCOUNTER — Encounter: Payer: Self-pay | Admitting: *Deleted

## 2019-05-04 DIAGNOSIS — I4821 Permanent atrial fibrillation: Secondary | ICD-10-CM

## 2019-05-04 DIAGNOSIS — I459 Conduction disorder, unspecified: Secondary | ICD-10-CM

## 2019-05-05 ENCOUNTER — Telehealth: Payer: Self-pay | Admitting: *Deleted

## 2019-05-05 LAB — CUP PACEART REMOTE DEVICE CHECK
Battery Remaining Longevity: 34 mo
Battery Voltage: 2.97 V
Brady Statistic AP VP Percent: 0 %
Brady Statistic AP VS Percent: 0 %
Brady Statistic AS VP Percent: 95.03 %
Brady Statistic AS VS Percent: 4.97 %
Brady Statistic RA Percent Paced: 0 %
Brady Statistic RV Percent Paced: 95.89 %
Date Time Interrogation Session: 20201030142330
Implantable Lead Implant Date: 20161115
Implantable Lead Implant Date: 20161115
Implantable Lead Location: 753859
Implantable Lead Location: 753860
Implantable Lead Model: 5076
Implantable Lead Model: 5076
Implantable Pulse Generator Implant Date: 20161115
Lead Channel Impedance Value: 361 Ohm
Lead Channel Impedance Value: 361 Ohm
Lead Channel Impedance Value: 456 Ohm
Lead Channel Impedance Value: 513 Ohm
Lead Channel Pacing Threshold Amplitude: 0.5 V
Lead Channel Pacing Threshold Amplitude: 0.625 V
Lead Channel Pacing Threshold Pulse Width: 0.4 ms
Lead Channel Pacing Threshold Pulse Width: 0.4 ms
Lead Channel Sensing Intrinsic Amplitude: 11.25 mV
Lead Channel Sensing Intrinsic Amplitude: 11.25 mV
Lead Channel Sensing Intrinsic Amplitude: 2.75 mV
Lead Channel Sensing Intrinsic Amplitude: 2.75 mV
Lead Channel Setting Pacing Amplitude: 2.5 V
Lead Channel Setting Pacing Pulse Width: 0.4 ms
Lead Channel Setting Sensing Sensitivity: 2.8 mV

## 2019-05-05 NOTE — Telephone Encounter (Signed)
-----   Message from [name], MD sent at 05/04/2019  4:22 PM EDT ----- Results reviewed.  Creatinine has improved to baseline at 1.6 and potassium down to 5.3.  Continue with current plan.

## 2019-05-05 NOTE — Telephone Encounter (Signed)
Patient informed. Copy sent to PCP °

## 2019-05-08 ENCOUNTER — Telehealth (INDEPENDENT_AMBULATORY_CARE_PROVIDER_SITE_OTHER): Payer: PPO | Admitting: Cardiology

## 2019-05-08 ENCOUNTER — Encounter: Payer: Self-pay | Admitting: Cardiology

## 2019-05-08 ENCOUNTER — Other Ambulatory Visit: Payer: Self-pay | Admitting: Internal Medicine

## 2019-05-08 VITALS — BP 127/74 | HR 79 | Ht 69.0 in | Wt 247.0 lb

## 2019-05-08 DIAGNOSIS — I429 Cardiomyopathy, unspecified: Secondary | ICD-10-CM | POA: Diagnosis not present

## 2019-05-08 DIAGNOSIS — I5042 Chronic combined systolic (congestive) and diastolic (congestive) heart failure: Secondary | ICD-10-CM

## 2019-05-08 DIAGNOSIS — R001 Bradycardia, unspecified: Secondary | ICD-10-CM

## 2019-05-08 DIAGNOSIS — I4821 Permanent atrial fibrillation: Secondary | ICD-10-CM | POA: Diagnosis not present

## 2019-05-08 NOTE — Patient Instructions (Addendum)
Medication Instructions:    Your physician recommends that you continue on your current medications as directed. Please refer to the Current Medication list given to you today.  Labwork:  Your physician recommends that you return for lab work in: 3 months before your next visit to check your BMET. Can be done at [facility]  Testing/Procedures:  NONE  Follow-Up:  Your physician recommends that you schedule a follow-up appointment in: 3 months.  Any Other Special Instructions Will Be Listed Below (If Applicable).  If you need a refill on your cardiac medications before your next appointment, please call your pharmacy.

## 2019-05-08 NOTE — Progress Notes (Signed)
Virtual Visit via Telephone Note   This visit type was conducted due to national recommendations for restrictions regarding the COVID-19 Pandemic (e.g. social distancing) in an effort to limit this patient's exposure and mitigate transmission in our community.  Due to his co-morbid illnesses, this patient is at least at moderate risk for complications without adequate follow up.  This format is felt to be most appropriate for this patient at this time.  The patient did not have access to video technology/had technical difficulties with video requiring transitioning to audio format only (telephone).  All issues noted in this document were discussed and addressed.  No physical exam could be performed with this format.  Please refer to the patient's chart for his  consent to telehealth for [facility].    Date:  05/08/2019   ID:  Dylan Tyler, DOB September 19, 1945, MRN [id number]  Patient Location: Home Provider Location: Office  PCP:  Glenda Chroman, MD  Cardiologist:  Rozann Lesches, MD  Electrophysiologist:  Thompson Grayer, MD   Evaluation Performed:  Follow-Up Visit  Chief Complaint:   Cardiac follow-up  History of Present Illness:    Dylan Tyler is a 73 y.o. male last seen in September.  We spoke by phone today, I also spoke with his wife.  He reports fatigue, has been trying to do some walking, but is a fall risk, he uses a walker and has chronic pain.  He reports NYHA class II-III dyspnea, no angina symptoms or palpitations.  He follows with Dr. [name] in the device clinic, Medtronic pacemaker in place.  Recent device interrogation showed normal function.  RV pacing 96%.  He does not report any syncope.  Since last visit we cut Lasix to 40 mg alternating with 20 mg every other day and degree of renal insufficiency has stabilized based on follow-up lab work as reviewed below.  Remaining cardiac regimen includes Coreg, Eliquis, Zocor, Aldactone, and Entresto.  The patient does not have  symptoms concerning for COVID-19 infection (fever, chills, cough, or new shortness of breath).    Past Medical History:  Diagnosis Date  . Ankylosing spondylitis ([place])   . Anxiety   . Aortic root dilatation ([facility])    Mild  . AR (aortic regurgitation)    Mild  . Atrial fibrillation and flutter ([place])   . Bladder cancer ([place])   . Chronic pain    Followed at [facility]  . COPD (chronic obstructive pulmonary disease) ([place])   . DDD (degenerative disc disease), lumbosacral   . DDD (degenerative disc disease), thoracolumbar   . Degenerative cervical disc   . Depression   . Diabetes mellitus type II   . Diverticulitis   . Essential hypertension   . GERD (gastroesophageal reflux disease)   . History of kidney stones   . Hyperlipidemia   . Kidney stones   . Migraine   . Nonischemic cardiomyopathy ([place])    Cardiolite 12/09 LVEF 55%, minor luminal irregularities at cardiac catheterization  . OSA on CPAP    doesnt use any more, needs new machine and hasnt gotten one.  . Presence of permanent cardiac pacemaker   . Rheumatoid arthritis ([place])   . Symptomatic bradycardia    Medtronic Advisa L dual-chamber pacemaker  05/2015 - Dr. Curt Bears   Past Surgical History:  Procedure Laterality Date  . CARDIAC CATHETERIZATION  08/13/11  . CARDIOVERSION N/A 07/02/2015   Procedure: CARDIOVERSION;  Surgeon: Sanda Klein, MD;  Location: [facility];  Service: Cardiovascular;  Laterality: N/A;  .  CATARACT EXTRACTION W/PHACO Left 02/10/2019   Procedure: CATARACT EXTRACTION PHACO AND INTRAOCULAR LENS PLACEMENT (IOC);  Surgeon: Baruch Goldmann, MD;  Location: [facility];  Service: Ophthalmology;  Laterality: Left;  CDE: 7.10  . CATARACT EXTRACTION W/PHACO Right 02/27/2019   Procedure: CATARACT EXTRACTION PHACO AND INTRAOCULAR LENS PLACEMENT RIGHT EYE CDE=11.84;  Surgeon: Baruch Goldmann, MD;  Location: [facility];  Service: Ophthalmology;  Laterality: Right;  right  . CYSTOSCOPY WITH HOLMIUM LASER LITHOTRIPSY  1990's  . EP  IMPLANTABLE DEVICE N/A 05/21/2015   MDT Advisa DR MRI pacemaker implanted by Dr Curt Bears  . KNEE ARTHROSCOPY Right ~ 1991  . LAPAROSCOPIC CHOLECYSTECTOMY  ~ 2008  . TRANSURETHRAL RESECTION OF BLADDER TUMOR WITH GYRUS (TURBT-GYRUS)  1990's?     Current Meds  Medication Sig  . acetaminophen (TYLENOL) 500 MG tablet Take 500 mg by mouth every 6 (six) hours as needed (pain).   Marland Kitchen albuterol (PROVENTIL) (2.5 MG/3ML) 0.083% nebulizer solution Take 2.5 mg by nebulization every 6 (six) hours as needed for wheezing.  . Alcohol Swabs 70 % PADS Use as directed  . ALPRAZolam (XANAX) 0.5 MG tablet Take 0.5 mg by mouth 2 (two) times daily as needed.  . BD PEN NEEDLE NANO U/F 32G X 4 MM MISC USE ONE AS DIRECTED - 4 injections per day  . carvedilol (COREG) 6.25 MG tablet TAKE ONE TABLET BY MOUTH TWICE DAILY. TAKE WITH A MEAL. (Patient taking differently: Take 6.25 mg by mouth 2 (two) times daily with a meal. )  . Cholecalciferol (VITAMIN D-3) 1000 UNITS CAPS Take 1,000 Units by mouth 2 (two) times daily.   . cholestyramine (QUESTRAN) 4 g packet Take 4 g by mouth 3 (three) times daily with meals.  . citalopram (CELEXA) 40 MG tablet Take 20-40 mg by mouth daily.   . Continuous Blood Gluc Receiver (FREESTYLE LIBRE 14 DAY READER) DEVI USE AS DIRECTED.  Marland Kitchen Continuous Blood Gluc Sensor (FREESTYLE LIBRE 14 DAY SENSOR) MISC 1 each by Does not apply route every 14 (fourteen) days.  Marland Kitchen ELIQUIS 5 MG TABS tablet TAKE ONE TABLET BY MOUTH TWICE DAILY  . FREESTYLE LITE test strip USE TO CHECK BLOOD SUGAR FOUR TIMES DAILY.  . furosemide (LASIX) 40 MG tablet 40 mg alternating with 20 mg every other day  . gabapentin (NEURONTIN) 400 MG capsule Take 400 mg by mouth 3 (three) times daily.   . Insulin Glargine (LANTUS SOLOSTAR) 100 UNIT/ML Solostar Pen INJECT 90 UNITS INTO SKIN DAILY AT 10PM.  . Insulin Pen Needle (B-D ULTRAFINE III SHORT PEN) 31G X 8 MM MISC Use to inject insulin 4 x daily  . ipratropium-albuterol (DUONEB) 0.5-2.5  (3) MG/3ML SOLN Take 3 mLs by nebulization every 4 (four) hours as needed (for shortness of breath).  . Lancets (FREESTYLE) lancets USE TO CHECK BLOOD SUGAR FOUR TIMES DAILY.  Marland Kitchen metoCLOPramide (REGLAN) 5 MG tablet Take 5 mg by mouth every 6 (six) hours as needed for nausea or vomiting.   . nitroGLYCERIN (NITROSTAT) 0.4 MG SL tablet DISSOLVE ONE TABLET UNDER TONGUE EVERY 5 MINUTES UP TO 3 DOSES AS NEEDED FOR CHEST PAIN. IF no RELIEF AFTER 3 doses, proceed TO THE ER (Patient taking differently: Place 0.4 mg under the tongue every 5 (five) minutes as needed for chest pain. )  . NON FORMULARY 1 each by Other route See admin instructions. CPAP - Uses nightly  . NOVOLOG FLEXPEN 100 UNIT/ML FlexPen INJECT 25-31 UNITS SUBCUTANEOUSLY THREE TIMES DAILY AS DIRECTED. TAKE WITH MEALS. (Patient taking differently:  Inject 25-31 Units into the skin 3 (three) times daily with meals. Per sliding scale)  . sacubitril-valsartan (ENTRESTO) 24-26 MG Take 1 tablet by mouth 2 (two) times daily.  . simvastatin (ZOCOR) 40 MG tablet TAKE ONE TABLET BY MOUTH AT BEDTIME  . spironolactone (ALDACTONE) 25 MG tablet Take 0.5 tablets (12.5 mg total) by mouth daily.  . traMADol (ULTRAM) 50 MG tablet Take 1 tablet by mouth daily as needed (pain).   . TRELEGY ELLIPTA 100-62.5-25 MCG/INH AEPB Inhale 1 puff into the lungs daily.  . vitamin B-12 (CYANOCOBALAMIN) 1000 MCG tablet Take 1,000 mcg by mouth 2 (two) times daily.    . VOLTAREN 1 % GEL Apply 2 g topically 4 (four) times daily as needed (for pain).      Allergies:   Patient has no known allergies.   Social History   Tobacco Use  . Smoking status: Current Every Day Smoker    Packs/day: 0.30    Years: 50.00    Pack years: 15.00    Types: E-cigarettes    Start date: 02/26/1959    Last attempt to quit: 12/06/2013    Years since quitting: 5.4  . Smokeless tobacco: Former Systems developer    Types: Chew  . Tobacco comment: 05/21/2015 "quit chewing in 1992"  Substance Use Topics  .  Alcohol use: Yes    Alcohol/week: 2.0 standard drinks    Types: 2 Cans of beer per week  . Drug use: Yes    Types: Cocaine    Comment: 05/21/2015 "quit in the early 2000"     Family Hx: The patient's family history includes Cancer in his father; Heart attack in his mother.  ROS:   Please see the history of present illness.    Chronic pain. All other systems reviewed and are negative.   Prior CV studies:   The following studies were reviewed today:  Echocardiogram 12/12/2018([facility]): Mild to moderate LVH with LVEF approximately 30 to 40% with global hypokinesis, mildly dilated right ventricle with device wire present, moderate left atrial enlargement, mild aortic regurgitation, mild mitral regurgitation, mild tricuspid regurgitation, RVSP estimated 33 mmHg.  Echocardiogram 04/13/2019:  1. Left ventricular ejection fraction, by visual estimation, is 35 to 40%. The left ventricle has moderately decreased function. Normal left ventricular size. There is moderately increased left ventricular hypertrophy.  2. Left ventricular diastolic Doppler parameters are indeterminate pattern of LV diastolic filling.  3. Global right ventricle has normal systolic function.The right ventricular size is mildly enlarged. No increase in right ventricular wall thickness.  4. Left atrial size was mildly dilated.  5. Right atrial size was mildly dilated.  6. Mild aortic valve annular calcification.  7. Mild mitral annular calcification.  8. The mitral valve is degenerative. Mild mitral valve regurgitation.  9. The tricuspid valve is grossly normal. Tricuspid valve regurgitation is mild. 10. The aortic valve is tricuspid Aortic valve regurgitation is mild to moderate by color flow Doppler. Mild aortic valve sclerosis without stenosis. 11. The pulmonic valve was not well visualized. Pulmonic valve regurgitation is not visualized by color flow Doppler. 12. Mildly elevated pulmonary artery  systolic pressure. 13. A pacer wire is visualized in the RA and RV. 14. The inferior vena cava is normal in size with greater than 50% respiratory variability, suggesting right atrial pressure of 3 mmHg.  Labs/Other Tests and Data Reviewed:    EKG:  An ECG dated 01/19/2019 was personally reviewed today and demonstrated:  Ventricular paced rhythm with PVC.  Recent Labs:  02/08/2019: ALT 17; BUN 22; BUN 21; Creatinine, Ser 1.65; Creatinine, Ser 1.63; Potassium 4.0; Potassium 4.1; Sodium 138; Sodium 139   Recent Lipid Panel Lab Results  Component Value Date/Time   CHOL 111 12/06/2017   TRIG 423 (A) 12/06/2017   HDL 33 (A) 12/06/2017    Wt Readings from Last 3 Encounters:  05/08/19 247 lb (112 kg)  03/20/19 255 lb 9.6 oz (115.9 kg)  02/08/19 257 lb (116.6 kg)     Objective:    Vital Signs:  BP 127/74   Pulse 79   Ht 5\' 9"  (1.753 m)   Wt 247 lb (112 kg)   BMI 36.48 kg/m    Patient spoke in full sentences, not short of breath. No audible wheezing or coughing. Speech pattern normal.  ASSESSMENT & PLAN:    1.  Nonischemic cardiomyopathy, LVEF 35 to 40% range by recent echocardiogram.  Weight is down about 7 pounds, still has intermittent leg edema.  Continue Coreg, Entresto, Aldactone, and Lasix.  2.  Symptomatic bradycardia status post Medtronic pacemaker.  He continues to follow with Dr. [name].  3.  Permanent atrial fibrillation.  Continue Eliquis for stroke prophylaxis.  4.  CKD stage III, recent follow-up creatinine stable and tolerating current medical regimen.  COVID-19 Education: The signs and symptoms of COVID-19 were discussed with the patient and how to seek care for testing (follow up with PCP or arrange E-visit).  The importance of social distancing was discussed today.  Time:   Today, I have spent 12 minutes with the patient with telehealth technology discussing the above problems.     Medication Adjustments/Labs and Tests Ordered: Current medicines are  reviewed at length with the patient today.  Concerns regarding medicines are outlined above.   Tests Ordered: Orders Placed This Encounter  Procedures  . Basic metabolic panel    Medication Changes: No orders of the defined types were placed in this encounter.   Follow Up:  In Person 3 months in the [place] office.  Signed, Rozann Lesches, MD  05/08/2019 2:03 PM    [place]

## 2019-05-17 DIAGNOSIS — M459 Ankylosing spondylitis of unspecified sites in spine: Secondary | ICD-10-CM | POA: Diagnosis not present

## 2019-05-17 DIAGNOSIS — Z299 Encounter for prophylactic measures, unspecified: Secondary | ICD-10-CM | POA: Diagnosis not present

## 2019-05-17 DIAGNOSIS — J449 Chronic obstructive pulmonary disease, unspecified: Secondary | ICD-10-CM | POA: Diagnosis not present

## 2019-05-17 DIAGNOSIS — I4891 Unspecified atrial fibrillation: Secondary | ICD-10-CM | POA: Diagnosis not present

## 2019-05-17 DIAGNOSIS — E785 Hyperlipidemia, unspecified: Secondary | ICD-10-CM | POA: Diagnosis not present

## 2019-05-17 DIAGNOSIS — Z6841 Body Mass Index (BMI) 40.0 and over, adult: Secondary | ICD-10-CM | POA: Diagnosis not present

## 2019-05-25 DIAGNOSIS — R05 Cough: Secondary | ICD-10-CM | POA: Diagnosis not present

## 2019-05-25 DIAGNOSIS — R0989 Other specified symptoms and signs involving the circulatory and respiratory systems: Secondary | ICD-10-CM | POA: Diagnosis not present

## 2019-05-27 NOTE — Progress Notes (Signed)
Remote pacemaker transmission.   

## 2019-06-06 ENCOUNTER — Other Ambulatory Visit: Payer: Self-pay | Admitting: "Endocrinology

## 2019-06-08 DIAGNOSIS — E1165 Type 2 diabetes mellitus with hyperglycemia: Secondary | ICD-10-CM | POA: Diagnosis not present

## 2019-06-08 DIAGNOSIS — K769 Liver disease, unspecified: Secondary | ICD-10-CM | POA: Diagnosis not present

## 2019-06-08 DIAGNOSIS — E1122 Type 2 diabetes mellitus with diabetic chronic kidney disease: Secondary | ICD-10-CM | POA: Diagnosis not present

## 2019-06-08 DIAGNOSIS — N183 Chronic kidney disease, stage 3 unspecified: Secondary | ICD-10-CM | POA: Diagnosis not present

## 2019-06-08 LAB — BASIC METABOLIC PANEL
BUN: 20 (ref 4–21)
CO2: 25 — AB (ref 13–22)
Chloride: 102 (ref 99–108)
Creatinine: 1.6 — AB (ref 0.6–1.3)
Glucose: 131
Potassium: 5.2 (ref 3.4–5.3)
Sodium: 138 (ref 137–147)

## 2019-06-08 LAB — COMPREHENSIVE METABOLIC PANEL WITH GFR
Albumin: 4.1 (ref 3.5–5.0)
Calcium: 9.4 (ref 8.7–10.7)

## 2019-06-08 LAB — HEMOGLOBIN A1C: Hemoglobin A1C: 7.7

## 2019-06-08 LAB — HEPATIC FUNCTION PANEL
ALT: 38 (ref 10–40)
AST: 21 (ref 14–40)
Alkaline Phosphatase: 81 (ref 25–125)
Bilirubin, Total: 0.6

## 2019-06-14 DIAGNOSIS — Z1331 Encounter for screening for depression: Secondary | ICD-10-CM | POA: Diagnosis not present

## 2019-06-14 DIAGNOSIS — R5383 Other fatigue: Secondary | ICD-10-CM | POA: Diagnosis not present

## 2019-06-14 DIAGNOSIS — F419 Anxiety disorder, unspecified: Secondary | ICD-10-CM | POA: Diagnosis not present

## 2019-06-14 DIAGNOSIS — E1165 Type 2 diabetes mellitus with hyperglycemia: Secondary | ICD-10-CM | POA: Diagnosis not present

## 2019-06-14 DIAGNOSIS — Z0000 Encounter for general adult medical examination without abnormal findings: Secondary | ICD-10-CM | POA: Diagnosis not present

## 2019-06-14 DIAGNOSIS — Z7189 Other specified counseling: Secondary | ICD-10-CM | POA: Diagnosis not present

## 2019-06-14 DIAGNOSIS — Z6841 Body Mass Index (BMI) 40.0 and over, adult: Secondary | ICD-10-CM | POA: Diagnosis not present

## 2019-06-14 DIAGNOSIS — J45909 Unspecified asthma, uncomplicated: Secondary | ICD-10-CM | POA: Diagnosis not present

## 2019-06-14 DIAGNOSIS — I350 Nonrheumatic aortic (valve) stenosis: Secondary | ICD-10-CM | POA: Diagnosis not present

## 2019-06-14 DIAGNOSIS — Z299 Encounter for prophylactic measures, unspecified: Secondary | ICD-10-CM | POA: Diagnosis not present

## 2019-06-14 DIAGNOSIS — E7800 Pure hypercholesterolemia, unspecified: Secondary | ICD-10-CM | POA: Diagnosis not present

## 2019-06-14 DIAGNOSIS — Z1339 Encounter for screening examination for other mental health and behavioral disorders: Secondary | ICD-10-CM | POA: Diagnosis not present

## 2019-06-14 DIAGNOSIS — Z79899 Other long term (current) drug therapy: Secondary | ICD-10-CM | POA: Diagnosis not present

## 2019-06-14 DIAGNOSIS — F17210 Nicotine dependence, cigarettes, uncomplicated: Secondary | ICD-10-CM | POA: Diagnosis not present

## 2019-06-14 DIAGNOSIS — E785 Hyperlipidemia, unspecified: Secondary | ICD-10-CM | POA: Diagnosis not present

## 2019-06-14 DIAGNOSIS — I10 Essential (primary) hypertension: Secondary | ICD-10-CM | POA: Diagnosis not present

## 2019-06-16 ENCOUNTER — Ambulatory Visit (INDEPENDENT_AMBULATORY_CARE_PROVIDER_SITE_OTHER): Payer: PPO | Admitting: "Endocrinology

## 2019-06-16 ENCOUNTER — Telehealth: Payer: Self-pay | Admitting: "Endocrinology

## 2019-06-16 ENCOUNTER — Encounter: Payer: Self-pay | Admitting: "Endocrinology

## 2019-06-16 DIAGNOSIS — N183 Chronic kidney disease, stage 3 unspecified: Secondary | ICD-10-CM

## 2019-06-16 DIAGNOSIS — IMO0002 Reserved for concepts with insufficient information to code with codable children: Secondary | ICD-10-CM

## 2019-06-16 DIAGNOSIS — E1165 Type 2 diabetes mellitus with hyperglycemia: Secondary | ICD-10-CM | POA: Diagnosis not present

## 2019-06-16 DIAGNOSIS — E1122 Type 2 diabetes mellitus with diabetic chronic kidney disease: Secondary | ICD-10-CM

## 2019-06-16 DIAGNOSIS — F17200 Nicotine dependence, unspecified, uncomplicated: Secondary | ICD-10-CM

## 2019-06-16 DIAGNOSIS — E782 Mixed hyperlipidemia: Secondary | ICD-10-CM | POA: Diagnosis not present

## 2019-06-16 DIAGNOSIS — I10 Essential (primary) hypertension: Secondary | ICD-10-CM

## 2019-06-16 DIAGNOSIS — K769 Liver disease, unspecified: Secondary | ICD-10-CM

## 2019-06-16 NOTE — Patient Instructions (Signed)

## 2019-06-16 NOTE — Progress Notes (Signed)
06/16/2019                                                    Endocrinology Telehealth Visit Follow up Note -During COVID -19 Pandemic  This visit type was conducted due to national recommendations for restrictions regarding the COVID-19 Pandemic  in an effort to limit this patient's exposure and mitigate transmission of the corona virus.  Due to his co-morbid illnesses, Dylan Tyler is at  moderate to high risk for complications without adequate follow up.  This format is felt to be most appropriate for him at this time.  I connected with this patient on 06/16/2019   by telephone and verified that I am speaking with the correct person using two identifiers. Dylan Tyler, 1945/07/10. he has verbally consented to this visit. All issues noted in this document were discussed and addressed. The format was not optimal for physical exam.    Subjective:    Patient ID: Dylan Tyler, male    DOB: 12-03-1945.  he is engaged in telehealth via telephone in follow-up for management of currently uncontrolled, complicated type 2 diabetes, hyperlipidemia, hypertension.   PMD:  Glenda Chroman, MD.   Past Medical History:  Diagnosis Date  . Ankylosing spondylitis ([place])   . Anxiety   . Aortic root dilatation (H[facility])    Mild  . AR (aortic regurgitation)    Mild  . Atrial fibrillation and flutter ([place])   . Bladder cancer ([place])   . Chronic pain    [facility]  . COPD (chronic obstructive pulmonary disease) ([place])   . DDD (degenerative disc disease), lumbosacral   . DDD (degenerative disc disease), thoracolumbar   . Degenerative cervical disc   . Depression   . Diabetes mellitus type II   . Diverticulitis   . Essential hypertension   . GERD (gastroesophageal reflux disease)   . History of kidney stones   . Hyperlipidemia   . Kidney stones   . Migraine   . Nonischemic cardiomyopathy ([place])    Cardiolite 12/09 LVEF 55%, minor luminal irregularities at cardiac catheterization  . OSA on  CPAP    doesnt use any more, needs new machine and hasnt gotten one.  . Presence of permanent cardiac pacemaker   . Rheumatoid arthritis ([place])   . Symptomatic bradycardia    Medtronic Advisa L dual-chamber pacemaker  05/2015 - Dr. Curt Bears   Past Surgical History:  Procedure Laterality Date  . CARDIAC CATHETERIZATION  08/13/11  . CARDIOVERSION N/A 07/02/2015   Procedure: CARDIOVERSION;  Surgeon: Sanda Klein, MD;  Location: [place] [facility];  Service: Cardiovascular;  Laterality: N/A;  . CATARACT EXTRACTION W/PHACO Left 02/10/2019   Procedure: CATARACT EXTRACTION PHACO AND INTRAOCULAR LENS PLACEMENT ([place]);  Surgeon: Baruch Goldmann, MD;  Location: [facility];  Service: Ophthalmology;  Laterality: Left;  CDE: 7.10  . CATARACT EXTRACTION W/PHACO Right 02/27/2019   Procedure: CATARACT EXTRACTION PHACO AND INTRAOCULAR LENS PLACEMENT RIGHT EYE CDE=11.84;  Surgeon: Baruch Goldmann, MD;  Location: [facility];  Service: Ophthalmology;  Laterality: Right;  right  . CYSTOSCOPY WITH HOLMIUM LASER LITHOTRIPSY  1990's  . EP IMPLANTABLE DEVICE N/A 05/21/2015   MDT Advisa DR MRI pacemaker implanted by Dr Curt Bears  . KNEE ARTHROSCOPY Right ~ 1991  . LAPAROSCOPIC CHOLECYSTECTOMY  ~ 2008  . TRANSURETHRAL RESECTION OF BLADDER  TUMOR WITH GYRUS (TURBT-GYRUS)  1990's?   Social History   Socioeconomic History  . Marital status: Divorced    Spouse name: Not on file  . Number of children: Not on file  . Years of education: Not on file  . Highest education level: Not on file  Occupational History  . Occupation: Disabled    Employer: DISABLED  Tobacco Use  . Smoking status: Current Every Day Smoker    Packs/day: 0.30    Years: 50.00    Pack years: 15.00    Types: E-cigarettes    Start date: 02/26/1959    Last attempt to quit: 12/06/2013    Years since quitting: 5.5  . Smokeless tobacco: Former Systems developer    Types: Chew  . Tobacco comment: 05/21/2015 "quit chewing in 1992"  Substance and Sexual Activity  . Alcohol use: Yes     Alcohol/week: 2.0 standard drinks    Types: 2 Cans of beer per week  . Drug use: Yes    Types: Cocaine    Comment: 05/21/2015 "quit in the early 2000"  . Sexual activity: Not on file  Other Topics Concern  . Not on file  Social History Narrative   Divorced   No regular exercise   Social Determinants of Health   Financial Resource Strain:   . Difficulty of Paying Living Expenses: Not on file  Food Insecurity:   . Worried About Charity fundraiser in the Last Year: Not on file  . Ran Out of Food in the Last Year: Not on file  Transportation Needs:   . Lack of Transportation (Medical): Not on file  . Lack of Transportation (Non-Medical): Not on file  Physical Activity:   . Days of Exercise per Week: Not on file  . Minutes of Exercise per Session: Not on file  Stress:   . Feeling of Stress : Not on file  Social Connections:   . Frequency of Communication with Friends and Family: Not on file  . Frequency of Social Gatherings with Friends and Family: Not on file  . Attends Religious Services: Not on file  . Active Member of Clubs or Organizations: Not on file  . Attends Archivist Meetings: Not on file  . Marital Status: Not on file   Outpatient Encounter Medications as of 06/16/2019  Medication Sig  . acetaminophen (TYLENOL) 500 MG tablet Take 500 mg by mouth every 6 (six) hours as needed (pain).   Marland Kitchen albuterol (PROVENTIL) (2.5 MG/3ML) 0.083% nebulizer solution Take 2.5 mg by nebulization every 6 (six) hours as needed for wheezing.  . Alcohol Swabs 70 % PADS Use as directed  . ALPRAZolam (XANAX) 0.5 MG tablet Take 0.5 mg by mouth 2 (two) times daily as needed.  . BD PEN NEEDLE NANO U/F 32G X 4 MM MISC USE ONE AS DIRECTED - 4 injections per day  . carvedilol (COREG) 6.25 MG tablet TAKE ONE TABLET BY MOUTH TWICE DAILY. TAKE WITH A MEAL. (Patient taking differently: Take 6.25 mg by mouth 2 (two) times daily with a meal. )  . Cholecalciferol (VITAMIN D-3) 1000 UNITS  CAPS Take 1,000 Units by mouth 2 (two) times daily.   . cholestyramine (QUESTRAN) 4 g packet Take 4 g by mouth 3 (three) times daily with meals.  . citalopram (CELEXA) 40 MG tablet Take 20-40 mg by mouth daily.   . Continuous Blood Gluc Receiver (FREESTYLE LIBRE 14 DAY READER) DEVI USE AS DIRECTED.  Marland Kitchen Continuous Blood Gluc Sensor (FREESTYLE LIBRE 14  DAY SENSOR) MISC 1 each by Does not apply route every 14 (fourteen) days.  Marland Kitchen ELIQUIS 5 MG TABS tablet TAKE ONE TABLET BY MOUTH TWICE DAILY  . FREESTYLE LITE test strip USE TO CHECK BLOOD SUGAR FOUR TIMES DAILY.  . furosemide (LASIX) 40 MG tablet 40 mg alternating with 20 mg every other day  . gabapentin (NEURONTIN) 400 MG capsule Take 400 mg by mouth 3 (three) times daily.   . Insulin Glargine (LANTUS SOLOSTAR) 100 UNIT/ML Solostar Pen INJECT 90 UNITS INTO SKIN DAILY AT 10PM.  . Insulin Pen Needle (B-D ULTRAFINE III SHORT PEN) 31G X 8 MM MISC Use to inject insulin 4 x daily  . ipratropium-albuterol (DUONEB) 0.5-2.5 (3) MG/3ML SOLN Take 3 mLs by nebulization every 4 (four) hours as needed (for shortness of breath).  . Lancets (FREESTYLE) lancets USE TO CHECK BLOOD SUGAR FOUR TIMES DAILY.  Marland Kitchen metoCLOPramide (REGLAN) 5 MG tablet Take 5 mg by mouth every 6 (six) hours as needed for nausea or vomiting.   . nitroGLYCERIN (NITROSTAT) 0.4 MG SL tablet DISSOLVE ONE TABLET UNDER TONGUE EVERY 5 MINUTES UP TO 3 DOSES AS NEEDED FOR CHEST PAIN. IF no RELIEF AFTER 3 doses, proceed TO THE ER (Patient taking differently: Place 0.4 mg under the tongue every 5 (five) minutes as needed for chest pain. )  . NON FORMULARY 1 each by Other route See admin instructions. CPAP - Uses nightly  . NOVOLOG FLEXPEN 100 UNIT/ML FlexPen INJECT 25-31 UNITS SUBCUTANEOUSLY THREE TIMES DAILY AS DIRECTED WITH MEALS.  . sacubitril-valsartan (ENTRESTO) 24-26 MG Take 1 tablet by mouth 2 (two) times daily.  . simvastatin (ZOCOR) 40 MG tablet TAKE ONE TABLET BY MOUTH AT BEDTIME  . spironolactone  (ALDACTONE) 25 MG tablet Take 0.5 tablets (12.5 mg total) by mouth daily.  . traMADol (ULTRAM) 50 MG tablet Take 1 tablet by mouth daily as needed (pain).   . TRELEGY ELLIPTA 100-62.5-25 MCG/INH AEPB Inhale 1 puff into the lungs daily.  . vitamin B-12 (CYANOCOBALAMIN) 1000 MCG tablet Take 1,000 mcg by mouth 2 (two) times daily.    . VOLTAREN 1 % GEL Apply 2 g topically 4 (four) times daily as needed (for pain).    No facility-administered encounter medications on file as of 06/16/2019.    ALLERGIES: No Known Allergies  VACCINATION STATUS:  There is no immunization history on file for this patient.  Diabetes He presents for his follow-up diabetic visit. He has type 2 diabetes mellitus. Onset time: He was diagnosed at approximate age of 85 years. His disease course has been stable (His most recent A1c was 9.8% on 01/12/2017.). There are no hypoglycemic associated symptoms. Pertinent negatives for hypoglycemia include no confusion, pallor or seizures. Associated symptoms include foot paresthesias, polydipsia and polyuria. Pertinent negatives for diabetes include no fatigue, no polyphagia and no weakness. There are no hypoglycemic complications. Symptoms are stable. Diabetic complications include peripheral neuropathy. Risk factors for coronary artery disease include dyslipidemia, diabetes mellitus, hypertension, male sex, sedentary lifestyle, tobacco exposure and family history. Current diabetic treatment includes oral agent (dual therapy). He is following a generally unhealthy diet. When asked about meal planning, he reported none. He has not had a previous visit with a dietitian. He never (He is wheelchair-bound due to deconditioning, and due todegenerative joint disease.) participates in exercise. His home blood glucose trend is fluctuating minimally. His breakfast blood glucose range is generally 140-180 mg/dl. His lunch blood glucose range is generally 180-200 mg/dl. His dinner blood glucose range  is  generally 180-200 mg/dl. His bedtime blood glucose range is generally 180-200 mg/dl. His overall blood glucose range is 180-200 mg/dl. An ACE inhibitor/angiotensin II receptor blocker is being taken. He sees a podiatrist. Hyperlipidemia This is a chronic problem. The current episode started more than 1 year ago. Exacerbating diseases include diabetes and obesity. Factors aggravating his hyperlipidemia include smoking. Pertinent negatives include no myalgias. Current antihyperlipidemic treatment includes statins. Risk factors for coronary artery disease include dyslipidemia, diabetes mellitus, hypertension, a sedentary lifestyle, male sex and obesity.     Objective:    There were no vitals taken for this visit.  Wt Readings from Last 3 Encounters:  05/08/19 247 lb (112 kg)  03/20/19 255 lb 9.6 oz (115.9 kg)  02/08/19 257 lb (116.6 kg)      Lipid Panel     Component Value Date/Time   CHOL 111 12/06/2017 0000   TRIG 423 (A) 12/06/2017 0000   HDL 33 (A) 12/06/2017 0000   Recent Results (from the past 2160 hour(s))  CUP PACEART REMOTE DEVICE CHECK     Status: None   Collection Time: 05/05/19  3:08 PM  Result Value Ref Range   Date Time Interrogation Session [id number]    Pulse Generator Manufacturer MERM    Pulse Gen Model [id number] Advisa DR MRI    Pulse Gen Serial Number [id number]    Clinic Name [facility]    Implantable Pulse Generator Type Implantable Pulse Generator    Implantable Pulse Generator Implant Date [id number]    Implantable Lead Manufacturer MERM    Implantable Lead Model 5076 CapSureFix Novus    Implantable Lead Serial Number [id number]    Implantable Lead Implant Date [id number]    Implantable Lead Location Detail 1 UNKNOWN    Implantable Lead Location [id number]    Implantable Lead Manufacturer MERM    Implantable Lead Model 5076 CapSureFix Novus    Implantable Lead Serial Number [id number]    Implantable Lead Implant Date [id number]    Implantable Lead  Location Detail 1 UNKNOWN    Implantable Lead Location [phone]    Lead Channel Setting Sensing Sensitivity 2.8 mV   Lead Channel Setting Pacing Pulse Width 0.4 ms   Lead Channel Setting Pacing Amplitude 2.5 V   Lead Channel Impedance Value 513 ohm   Lead Channel Impedance Value 361 ohm   Lead Channel Sensing Intrinsic Amplitude 2.75 mV   Lead Channel Sensing Intrinsic Amplitude 2.75 mV   Lead Channel Pacing Threshold Amplitude 0.5 V   Lead Channel Pacing Threshold Pulse Width 0.4 ms   Lead Channel Impedance Value 456 ohm   Lead Channel Impedance Value 361 ohm   Lead Channel Sensing Intrinsic Amplitude 11.25 mV   Lead Channel Sensing Intrinsic Amplitude 11.25 mV   Lead Channel Pacing Threshold Amplitude 0.625 V   Lead Channel Pacing Threshold Pulse Width 0.4 ms   Battery Status OK    Battery Remaining Longevity 34 mo   Battery Voltage 2.97 V   Brady Statistic RA Percent Paced 0 %   Brady Statistic RV Percent Paced 95.89 %   Brady Statistic AP VP Percent 0 %   Brady Statistic AS VP Percent 95.03 %   Brady Statistic AP VS Percent 0 %   Brady Statistic AS VS Percent 4.97 %  Basic metabolic panel     Status: Abnormal   Collection Time: 06/08/19 12:00 AM  Result Value Ref Range   Glucose 131    BUN 20 4 - 21   CO2 25 (  A) 13 - 22   Creatinine 1.6 (A) 0.6 - 1.3   Potassium 5.2 3.4 - 5.3   Sodium 138 137 - 147   Chloride 102 99 - 108  Comprehensive metabolic panel     Status: None   Collection Time: 06/08/19 12:00 AM  Result Value Ref Range   Calcium 9.4 8.7 - 10.7   Albumin 4.1 3.5 - 5.0  Hepatic function panel     Status: None   Collection Time: 06/08/19 12:00 AM  Result Value Ref Range   Alkaline Phosphatase 81 25 - 125   ALT 38 10 - 40   AST 21 14 - 40   Bilirubin, Total 0.6   Hemoglobin A1c     Status: None   Collection Time: 06/08/19 12:00 AM  Result Value Ref Range   Hemoglobin A1C 7.7      Assessment & Plan:   1. Type 2 diabetes mellitus with stage 3 chronic  kidney disease, without long-term current use of insulin ([place])  - Patient has currently uncontrolled symptomatic type 2 DM since  73 years of age. -He reports still fluctuating glycemic profile, however overall improving.  His previsit labs show A1c of 7.7%, overall improving from 9.7%.    -his diabetes is complicated by stage 3 renal insufficiency, obesity/sedentary life, neuropathy, cardiomyopathy, chronic heavy smoking, inadequate insurance and LAMARK SCHUE remains at a high risk for more acute and chronic complications which include CAD, CVA, CKD, retinopathy, and neuropathy. These are all discussed in detail with the patient.  - I have counseled him on diet management and weight loss, by adopting a carbohydrate restricted/protein rich diet.  - he  admits there is a room for improvement in his diet and drink choices. -  Suggestion is made for him to avoid simple carbohydrates  from his diet including Cakes, Sweet Desserts / Pastries, Ice Cream, Soda (diet and regular), Sweet Tea, Candies, Chips, Cookies, Sweet Pastries,  Store Bought Juices, Alcohol in Excess of  1-2 drinks a day, Artificial Sweeteners, Coffee Creamer, and "Sugar-free" Products. This will help patient to have stable blood glucose profile and potentially avoid unintended weight gain.   - I encouraged him to switch to  unprocessed or minimally processed complex starch and increased protein intake (animal or plant source), fruits, and vegetables.  - he is advised to stick to a routine mealtimes to eat 3 meals  a day and avoid unnecessary snacks ( to snack only to correct hypoglycemia).   - I have approached him with the following individualized plan to manage diabetes and patient agrees:   - Based on his glucose profile, he will continue to need intensive treatment with basal/bolus insulin in order for him to maintain control of diabetes to target.    -He is advised to continue Lantus 90 units daily at 8 PM every day so that  he won't miss  his doses.  -He is advised to continue NovoLog 25 units  units 3 times a day before meals for pre-meal blood glucose readings above 90 mg/dL (with additional correction for readings above 150 mg/dL)  associated with strict monitoring of blood glucose 4 times a day- before meals and at bedtime.  -He is currently using freestyle libre CGM device, advised to use this device at all times.  -Patient is encouraged to call clinic for blood glucose levels less than 70 or above 300 mg /dl. - He is not a candidate for metformin therapy due to CKD.  - He  will be considered for incretin therapy when he gets appropriate insurance coverage.   2) Lipids/HPL: He has significant hypertriglyceridemia.  He is advised to continue simvastatin 40 mg p.o. nightly along with his  cholestyramine 4 g 3 times a day.    3 )hypertension he is advised to home monitor blood pressure and report if > 140/90 on 2 separate readings. -He is currently on carvedilol 6.25 mg p.o. twice daily, Lasix as needed, spironolactone 12.50 mg p.o. daily   4) Chronic Care/Health Maintenance:  -he  is on ACEI/ARB and Statin medications and  is encouraged to continue to follow up with Ophthalmology, Dentist,  Podiatrist at least yearly or according to recommendations. - I have recommended yearly flu vaccine and pneumonia vaccination at least every 5 years; and  sleep for at least 7 hours a day.  The patient was counseled on the dangers of tobacco use, and was advised to quit.  Reviewed strategies to maximize success, including removing cigarettes and smoking materials from environment.   - I advised patient to maintain close follow up with Glenda Chroman, MD for primary care needs.  - Patient Care Time Today:  25 min, of which >50% was spent in  counseling and the rest reviewing his  current and  previous labs/studies, previous treatments, his blood glucose readings, and medications' doses and developing a plan for long-term  care based on the latest recommendations for standards of care.   Dylan Tyler participated in the discussions, expressed understanding, and voiced agreement with the above plans.  All questions were answered to his satisfaction. he is encouraged to contact clinic should he have any questions or concerns prior to his return visit.   Follow up plan: - Return in about 4 months (around 10/15/2019) for Bring Meter and Logs- A1c in Office, Include 8 log sheets.  Glade Lloyd, MD Phone: [phone]  Fax: [phone]   06/16/2019, 12:50 PM This note was partially dictated with voice recognition software. Similar sounding words can be transcribed inadequately or may not  be corrected upon review.

## 2019-06-16 NOTE — Telephone Encounter (Signed)
Dylan Tyler with [facility] called and needs a DX code for him [phone]

## 2019-06-19 NOTE — Telephone Encounter (Signed)
Called X 2. No answer.

## 2019-06-20 NOTE — Telephone Encounter (Signed)
Kim Cruise left another VM for you to call her back regarding this DX code for him. He had labs done there on 06/18/23. One of the DX codes is N18.3 and the code updated in October of this year that is not a complete code. She needs the complete code. You can fax an amended order to HIM Dept at [facility]. [phone] attention to [employer].

## 2019-06-20 NOTE — Telephone Encounter (Signed)
New order faxed. 

## 2019-07-11 ENCOUNTER — Other Ambulatory Visit: Payer: Self-pay | Admitting: Cardiology

## 2019-07-11 ENCOUNTER — Other Ambulatory Visit: Payer: Self-pay | Admitting: "Endocrinology

## 2019-07-19 DIAGNOSIS — Z1211 Encounter for screening for malignant neoplasm of colon: Secondary | ICD-10-CM | POA: Diagnosis not present

## 2019-07-31 DIAGNOSIS — Z01818 Encounter for other preprocedural examination: Secondary | ICD-10-CM | POA: Diagnosis not present

## 2019-08-01 ENCOUNTER — Telehealth: Payer: Self-pay | Admitting: "Endocrinology

## 2019-08-01 NOTE — Telephone Encounter (Signed)
Pt's wife called and said he is having a colonoscopy tomorrow and on the paper it says if you are taking insulin to please only take half on the day of prep. Will you call her?

## 2019-08-02 DIAGNOSIS — Z79899 Other long term (current) drug therapy: Secondary | ICD-10-CM | POA: Diagnosis not present

## 2019-08-02 DIAGNOSIS — Z794 Long term (current) use of insulin: Secondary | ICD-10-CM | POA: Diagnosis not present

## 2019-08-02 DIAGNOSIS — Z886 Allergy status to analgesic agent status: Secondary | ICD-10-CM | POA: Diagnosis not present

## 2019-08-02 DIAGNOSIS — D120 Benign neoplasm of cecum: Secondary | ICD-10-CM | POA: Diagnosis not present

## 2019-08-02 DIAGNOSIS — I2510 Atherosclerotic heart disease of native coronary artery without angina pectoris: Secondary | ICD-10-CM | POA: Diagnosis not present

## 2019-08-02 DIAGNOSIS — Z7901 Long term (current) use of anticoagulants: Secondary | ICD-10-CM | POA: Diagnosis not present

## 2019-08-02 DIAGNOSIS — K5730 Diverticulosis of large intestine without perforation or abscess without bleeding: Secondary | ICD-10-CM | POA: Diagnosis not present

## 2019-08-02 DIAGNOSIS — E119 Type 2 diabetes mellitus without complications: Secondary | ICD-10-CM | POA: Diagnosis not present

## 2019-08-02 DIAGNOSIS — Z87891 Personal history of nicotine dependence: Secondary | ICD-10-CM | POA: Diagnosis not present

## 2019-08-02 DIAGNOSIS — K5710 Diverticulosis of small intestine without perforation or abscess without bleeding: Secondary | ICD-10-CM | POA: Diagnosis not present

## 2019-08-02 DIAGNOSIS — G4730 Sleep apnea, unspecified: Secondary | ICD-10-CM | POA: Diagnosis not present

## 2019-08-02 DIAGNOSIS — Z8551 Personal history of malignant neoplasm of bladder: Secondary | ICD-10-CM | POA: Diagnosis not present

## 2019-08-02 DIAGNOSIS — D128 Benign neoplasm of rectum: Secondary | ICD-10-CM | POA: Diagnosis not present

## 2019-08-02 DIAGNOSIS — I4891 Unspecified atrial fibrillation: Secondary | ICD-10-CM | POA: Diagnosis not present

## 2019-08-02 DIAGNOSIS — Z9049 Acquired absence of other specified parts of digestive tract: Secondary | ICD-10-CM | POA: Diagnosis not present

## 2019-08-02 DIAGNOSIS — G4733 Obstructive sleep apnea (adult) (pediatric): Secondary | ICD-10-CM | POA: Diagnosis not present

## 2019-08-02 DIAGNOSIS — M5136 Other intervertebral disc degeneration, lumbar region: Secondary | ICD-10-CM | POA: Diagnosis not present

## 2019-08-02 DIAGNOSIS — J449 Chronic obstructive pulmonary disease, unspecified: Secondary | ICD-10-CM | POA: Diagnosis not present

## 2019-08-02 DIAGNOSIS — I10 Essential (primary) hypertension: Secondary | ICD-10-CM | POA: Diagnosis not present

## 2019-08-02 DIAGNOSIS — K621 Rectal polyp: Secondary | ICD-10-CM | POA: Diagnosis not present

## 2019-08-02 DIAGNOSIS — Z1211 Encounter for screening for malignant neoplasm of colon: Secondary | ICD-10-CM | POA: Diagnosis not present

## 2019-08-02 DIAGNOSIS — Z950 Presence of cardiac pacemaker: Secondary | ICD-10-CM | POA: Diagnosis not present

## 2019-08-02 NOTE — Telephone Encounter (Signed)
Pt went to [facility] this a.m. Took half dose of insulin.

## 2019-08-03 ENCOUNTER — Telehealth: Payer: Self-pay | Admitting: Cardiology

## 2019-08-03 ENCOUNTER — Ambulatory Visit (INDEPENDENT_AMBULATORY_CARE_PROVIDER_SITE_OTHER): Payer: PPO | Admitting: *Deleted

## 2019-08-03 DIAGNOSIS — I4821 Permanent atrial fibrillation: Secondary | ICD-10-CM

## 2019-08-03 NOTE — Telephone Encounter (Signed)

## 2019-08-07 LAB — CUP PACEART REMOTE DEVICE CHECK
Battery Remaining Longevity: 36 mo
Battery Voltage: 2.97 V
Brady Statistic AP VP Percent: 0 %
Brady Statistic AP VS Percent: 0 %
Brady Statistic AS VP Percent: 92.41 %
Brady Statistic AS VS Percent: 7.59 %
Brady Statistic RA Percent Paced: 0 %
Brady Statistic RV Percent Paced: 93.4 %
Date Time Interrogation Session: 20210131180402
Implantable Lead Implant Date: 20161115
Implantable Lead Implant Date: 20161115
Implantable Lead Location: 753859
Implantable Lead Location: 753860
Implantable Lead Model: 5076
Implantable Lead Model: 5076
Implantable Pulse Generator Implant Date: 20161115
Lead Channel Impedance Value: 361 Ohm
Lead Channel Impedance Value: 361 Ohm
Lead Channel Impedance Value: 437 Ohm
Lead Channel Impedance Value: 513 Ohm
Lead Channel Pacing Threshold Amplitude: 0.5 V
Lead Channel Pacing Threshold Amplitude: 0.75 V
Lead Channel Pacing Threshold Pulse Width: 0.4 ms
Lead Channel Pacing Threshold Pulse Width: 0.4 ms
Lead Channel Sensing Intrinsic Amplitude: 1.875 mV
Lead Channel Sensing Intrinsic Amplitude: 1.875 mV
Lead Channel Sensing Intrinsic Amplitude: 8.25 mV
Lead Channel Sensing Intrinsic Amplitude: 8.25 mV
Lead Channel Setting Pacing Amplitude: 2.5 V
Lead Channel Setting Pacing Pulse Width: 0.4 ms
Lead Channel Setting Sensing Sensitivity: 2.8 mV

## 2019-08-07 NOTE — Progress Notes (Signed)
PPM Remote  

## 2019-08-08 ENCOUNTER — Encounter: Payer: Self-pay | Admitting: *Deleted

## 2019-08-08 ENCOUNTER — Other Ambulatory Visit: Payer: Self-pay

## 2019-08-08 ENCOUNTER — Ambulatory Visit (INDEPENDENT_AMBULATORY_CARE_PROVIDER_SITE_OTHER): Payer: PPO | Admitting: Cardiology

## 2019-08-08 ENCOUNTER — Encounter: Payer: Self-pay | Admitting: Cardiology

## 2019-08-08 ENCOUNTER — Encounter (INDEPENDENT_AMBULATORY_CARE_PROVIDER_SITE_OTHER): Payer: Self-pay

## 2019-08-08 VITALS — BP 140/68 | HR 62 | Ht 69.0 in | Wt 257.0 lb

## 2019-08-08 DIAGNOSIS — I429 Cardiomyopathy, unspecified: Secondary | ICD-10-CM | POA: Diagnosis not present

## 2019-08-08 DIAGNOSIS — I5042 Chronic combined systolic (congestive) and diastolic (congestive) heart failure: Secondary | ICD-10-CM | POA: Diagnosis not present

## 2019-08-08 DIAGNOSIS — Z950 Presence of cardiac pacemaker: Secondary | ICD-10-CM

## 2019-08-08 DIAGNOSIS — I4821 Permanent atrial fibrillation: Secondary | ICD-10-CM

## 2019-08-08 NOTE — Progress Notes (Signed)
Cardiology Office Note  Date: 08/08/2019   ID: Rinaldo, Macqueen Dec 25, 1945, MRN [id number]  PCP:  Glenda Chroman, MD  Cardiologist:  Rozann Lesches, MD Electrophysiologist:  Thompson Grayer, MD   Chief Complaint  Patient presents with  . Cardiac follow-up    History of Present Illness: Dylan Tyler is a 74 y.o. male last assessed via telehealth encounter in November 2020.  He is here today for a follow-up visit.  He does not report any major change in health since last assessment.  Still struggles with chronic pain, using a cane today.  He does not report any change in dyspnea exertion, no chest pain or palpitations.  He sees Dr. [name] in the device clinic, Medtronic pacemaker in place.  Most recent device interrogation showed normal function.  Recent lab work is outlined below.  His current cardiac regimen includes Coreg, Eliquis, Lasix, Entresto, Aldactone, and Zocor.  Past Medical History:  Diagnosis Date  . Ankylosing spondylitis ([place])   . Anxiety   . Aortic root dilatation ([facility])    Mild  . AR (aortic regurgitation)    Mild  . Atrial fibrillation and flutter ([place])   . Bladder cancer ([place])   . Chronic pain    Followed at [facility]  . COPD (chronic obstructive pulmonary disease) ([place])   . DDD (degenerative disc disease), lumbosacral   . DDD (degenerative disc disease), thoracolumbar   . Degenerative cervical disc   . Depression   . Diabetes mellitus type II   . Diverticulitis   . Essential hypertension   . GERD (gastroesophageal reflux disease)   . History of kidney stones   . Hyperlipidemia   . Kidney stones   . Migraine   . Nonischemic cardiomyopathy ([place])    Cardiolite 12/09 LVEF 55%, minor luminal irregularities at cardiac catheterization  . OSA on CPAP    doesnt use any more, needs new machine and hasnt gotten one.  . Presence of permanent cardiac pacemaker   . Rheumatoid arthritis ([place])   . Symptomatic bradycardia    Medtronic Advisa L dual-chamber  pacemaker  05/2015 - Dr. Curt Bears    Past Surgical History:  Procedure Laterality Date  . CARDIAC CATHETERIZATION  08/13/11  . CARDIOVERSION N/A 07/02/2015   Procedure: CARDIOVERSION;  Surgeon: Sanda Klein, MD;  Location: [facility];  Service: Cardiovascular;  Laterality: N/A;  . CATARACT EXTRACTION W/PHACO Left 02/10/2019   Procedure: CATARACT EXTRACTION PHACO AND INTRAOCULAR LENS PLACEMENT ([place]);  Surgeon: Baruch Goldmann, MD;  Location: [facility];  Service: Ophthalmology;  Laterality: Left;  CDE: 7.10  . CATARACT EXTRACTION W/PHACO Right 02/27/2019   Procedure: CATARACT EXTRACTION PHACO AND INTRAOCULAR LENS PLACEMENT RIGHT EYE CDE=11.84;  Surgeon: Baruch Goldmann, MD;  Location: [facility];  Service: Ophthalmology;  Laterality: Right;  right  . CYSTOSCOPY WITH HOLMIUM LASER LITHOTRIPSY  1990's  . EP IMPLANTABLE DEVICE N/A 05/21/2015   MDT Advisa DR MRI pacemaker implanted by Dr Curt Bears  . KNEE ARTHROSCOPY Right ~ 1991  . LAPAROSCOPIC CHOLECYSTECTOMY  ~ 2008  . TRANSURETHRAL RESECTION OF BLADDER TUMOR WITH GYRUS (TURBT-GYRUS)  1990's?    Current Outpatient Medications  Medication Sig Dispense Refill  . acetaminophen (TYLENOL) 500 MG tablet Take 500 mg by mouth every 6 (six) hours as needed (pain).     Marland Kitchen albuterol (PROVENTIL) (2.5 MG/3ML) 0.083% nebulizer solution Take 2.5 mg by nebulization every 6 (six) hours as needed for wheezing.    . Alcohol Swabs 70 % PADS Use as directed 120  each 2  . ALPRAZolam (XANAX) 0.5 MG tablet Take 0.5 mg by mouth 2 (two) times daily as needed.  1  . carvedilol (COREG) 6.25 MG tablet TAKE ONE TABLET BY MOUTH TWICE DAILY. TAKE WITH A MEAL. 60 tablet 6  . Cholecalciferol (VITAMIN D-3) 1000 UNITS CAPS Take 1,000 Units by mouth 2 (two) times daily.     . cholestyramine (QUESTRAN) 4 g packet Take 4 g by mouth 3 (three) times daily with meals.    . citalopram (CELEXA) 40 MG tablet Take 20-40 mg by mouth daily.     . Continuous Blood Gluc Receiver (FREESTYLE LIBRE 14 DAY  READER) DEVI USE AS DIRECTED. 1 each 0  . Continuous Blood Gluc Sensor (FREESTYLE LIBRE 14 DAY SENSOR) MISC 1 each by Does not apply route every 14 (fourteen) days. 6 each 2  . ELIQUIS 5 MG TABS tablet TAKE ONE TABLET BY MOUTH TWICE DAILY 60 tablet 3  . FREESTYLE LITE test strip USE TO CHECK BLOOD SUGAR FOUR TIMES DAILY. 100 each 5  . furosemide (LASIX) 40 MG tablet TAKE ONE TABLET (40MG ) BY MOUTH EVERY *OTHER* DAY ALTERNATING WITH ONE-HALF TABLET (20MG ) 45 tablet 1  . gabapentin (NEURONTIN) 400 MG capsule Take 400 mg by mouth 3 (three) times daily.     . Insulin Glargine (LANTUS SOLOSTAR) 100 UNIT/ML Solostar Pen INJECT 90 UNITS INTO SKIN DAILY AT 10PM. 30 mL 4  . Insulin Pen Needle (B-D ULTRAFINE III SHORT PEN) 31G X 8 MM MISC Use to inject insulin 4 x daily 200 each 3  . Insulin Pen Needle (BD PEN NEEDLE NANO U/F) 32G X 4 MM MISC 1 each by Other route 4 (four) times daily. 200 each 5  . ipratropium-albuterol (DUONEB) 0.5-2.5 (3) MG/3ML SOLN Take 3 mLs by nebulization every 4 (four) hours as needed (for shortness of breath).    . Lancets (FREESTYLE) lancets USE TO CHECK BLOOD SUGAR FOUR TIMES DAILY. 200 each 5  . metoCLOPramide (REGLAN) 5 MG tablet Take 5 mg by mouth every 6 (six) hours as needed for nausea or vomiting.     . nitroGLYCERIN (NITROSTAT) 0.4 MG SL tablet DISSOLVE ONE TABLET UNDER TONGUE EVERY 5 MINUTES UP TO 3 DOSES AS NEEDED FOR CHEST PAIN. IF no RELIEF AFTER 3 doses, proceed TO THE ER (Patient taking differently: Place 0.4 mg under the tongue every 5 (five) minutes as needed for chest pain. ) 25 tablet 3  . NON FORMULARY 1 each by Other route See admin instructions. CPAP - Uses nightly    . NOVOLOG FLEXPEN 100 UNIT/ML FlexPen INJECT 25-31 UNITS SUBCUTANEOUSLY THREE TIMES DAILY AS DIRECTED WITH MEALS. 30 mL 2  . sacubitril-valsartan (ENTRESTO) 24-26 MG Take 1 tablet by mouth 2 (two) times daily. 60 tablet 6  . simvastatin (ZOCOR) 40 MG tablet TAKE ONE TABLET BY MOUTH AT BEDTIME 90  tablet 3  . traMADol (ULTRAM) 50 MG tablet Take 1 tablet by mouth daily as needed (pain).     . TRELEGY ELLIPTA 100-62.5-25 MCG/INH AEPB Inhale 1 puff into the lungs daily.    . vitamin B-12 (CYANOCOBALAMIN) 1000 MCG tablet Take 1,000 mcg by mouth 2 (two) times daily.      . VOLTAREN 1 % GEL Apply 2 g topically 4 (four) times daily as needed (for pain).     Marland Kitchen spironolactone (ALDACTONE) 25 MG tablet Take 0.5 tablets (12.5 mg total) by mouth daily. 45 tablet 2   No current facility-administered medications for this visit.   Allergies:  Patient has no known allergies.   Social History: The patient  reports that he has been smoking e-cigarettes. He started smoking about 60 years ago. He has a 15.00 pack-year smoking history. He has quit using smokeless tobacco.  His smokeless tobacco use included chew. He reports current alcohol use of about 2.0 standard drinks of alcohol per week. He reports current drug use. Drug: Cocaine.   ROS:  Please see the history of present illness. Otherwise, complete review of systems is positive for none.  All other systems are reviewed and negative.   Physical Exam: VS:  BP 140/68   Pulse 62   Ht 5\' 9"  (1.753 m)   Wt 257 lb (116.6 kg)   SpO2 97%   BMI 37.95 kg/m , BMI Body mass index is 37.95 kg/m.  Wt Readings from Last 3 Encounters:  08/08/19 257 lb (116.6 kg)  05/08/19 247 lb (112 kg)  03/20/19 255 lb 9.6 oz (115.9 kg)    General: Patient appears comfortable at rest.  Using a cane. HEENT: Conjunctiva and lids normal, wearing a mask.. Neck: Supple, no elevated JVP or carotid bruits, no thyromegaly. Lungs: Clear to auscultation, nonlabored breathing at rest. Cardiac: Regular rate and rhythm, no S3, soft systolic murmur. Abdomen: Soft, nontender, bowel sounds present. Extremities: Trace ankle edema, distal pulses 2+. Skin: Warm and dry. Musculoskeletal: No kyphosis. Neuropsychiatric: Alert and oriented x3, affect grossly appropriate.  ECG:  An ECG  dated 01/19/2019 was personally reviewed today and demonstrated:  Ventricular paced rhythm with PVC.  Recent Labwork: 06/08/2019: ALT 38; AST 21; BUN 20; Creatinine 1.6; Potassium 5.2; Sodium 138     Component Value Date/Time   CHOL 111 12/06/2017 0000   TRIG 423 (A) 12/06/2017 0000   HDL 33 (A) 12/06/2017 0000    Other Studies Reviewed Today:  Echocardiogram 04/13/2019: 1. Left ventricular ejection fraction, by visual estimation, is 35 to 40%. The left ventricle has moderately decreased function. Normal left ventricular size. There is moderately increased left ventricular hypertrophy. 2. Left ventricular diastolic Doppler parameters are indeterminate pattern of LV diastolic filling. 3. Global right ventricle has normal systolic function.The right ventricular size is mildly enlarged. No increase in right ventricular wall thickness. 4. Left atrial size was mildly dilated. 5. Right atrial size was mildly dilated. 6. Mild aortic valve annular calcification. 7. Mild mitral annular calcification. 8. The mitral valve is degenerative. Mild mitral valve regurgitation. 9. The tricuspid valve is grossly normal. Tricuspid valve regurgitation is mild. 10. The aortic valve is tricuspid Aortic valve regurgitation is mild to moderate by color flow Doppler. Mild aortic valve sclerosis without stenosis. 11. The pulmonic valve was not well visualized. Pulmonic valve regurgitation is not visualized by color flow Doppler. 12. Mildly elevated pulmonary artery systolic pressure. 13. A pacer wire is visualized in the RA and RV. 14. The inferior vena cava is normal in size with greater than 50% respiratory variability, suggesting right atrial pressure of 3 mmHg.  Assessment and Plan:  1.  Nonischemic cardiomyopathy with LVEF 35 to 40%.  He continues on Coreg, Aldactone, Lasix, and Entresto.  No up titration for now with creatinine 1.6 and potassium high normal at 5.2.  We will obtain a follow-up  echocardiogram prior to his next visit.  2.  Symptomatic bradycardia status post Medtronic pacemaker.  He continues to follow with Dr. [name].  3.  Permanent atrial fibrillation/flutter.  He continues on Eliquis for stroke prophylaxis.  No bleeding problems reported.  4.  CKD stage IIIb,  recent creatinine 1.6.  Medication Adjustments/Labs and Tests Ordered: Current medicines are reviewed at length with the patient today.  Concerns regarding medicines are outlined above.   Tests Ordered: Orders Placed This Encounter  Procedures  . ECHOCARDIOGRAM COMPLETE    Medication Changes: No orders of the defined types were placed in this encounter.   Disposition:  Follow up Follow-up 4 months in the [place] office.  Signed, [name], MD, [facility] 08/08/2019 4:24 PM    [facility] [place], [place], [place] [address] Phone: [phone]; Fax: [phone]

## 2019-08-08 NOTE — Patient Instructions (Addendum)
Medication Instructions:    Your physician recommends that you continue on your current medications as directed. Please refer to the Current Medication list given to you today.  Labwork:  NONE  Testing/Procedures: Your physician has requested that you have an echocardiogram in 4 months just before your next visit. Echocardiography is a painless test that uses sound waves to create images of your heart. It provides your doctor with information about the size and shape of your heart and how well your heart's chambers and valves are working. This procedure takes approximately one hour. There are no restrictions for this procedure.  Follow-Up:  Your physician recommends that you schedule a follow-up appointment in: 4 months (office).  Any Other Special Instructions Will Be Listed Below (If Applicable).  If you need a refill on your cardiac medications before your next appointment, please call your pharmacy. 

## 2019-08-09 ENCOUNTER — Other Ambulatory Visit: Payer: Self-pay | Admitting: "Endocrinology

## 2019-08-23 DIAGNOSIS — D122 Benign neoplasm of ascending colon: Secondary | ICD-10-CM | POA: Diagnosis not present

## 2019-08-28 DIAGNOSIS — D692 Other nonthrombocytopenic purpura: Secondary | ICD-10-CM | POA: Diagnosis not present

## 2019-08-28 DIAGNOSIS — E1122 Type 2 diabetes mellitus with diabetic chronic kidney disease: Secondary | ICD-10-CM | POA: Diagnosis not present

## 2019-08-28 DIAGNOSIS — D6869 Other thrombophilia: Secondary | ICD-10-CM | POA: Diagnosis not present

## 2019-08-28 DIAGNOSIS — E1142 Type 2 diabetes mellitus with diabetic polyneuropathy: Secondary | ICD-10-CM | POA: Diagnosis not present

## 2019-08-28 DIAGNOSIS — E1159 Type 2 diabetes mellitus with other circulatory complications: Secondary | ICD-10-CM | POA: Diagnosis not present

## 2019-08-28 DIAGNOSIS — E785 Hyperlipidemia, unspecified: Secondary | ICD-10-CM | POA: Diagnosis not present

## 2019-08-28 DIAGNOSIS — F1190 Opioid use, unspecified, uncomplicated: Secondary | ICD-10-CM | POA: Diagnosis not present

## 2019-08-28 DIAGNOSIS — F17290 Nicotine dependence, other tobacco product, uncomplicated: Secondary | ICD-10-CM | POA: Diagnosis not present

## 2019-08-28 DIAGNOSIS — E1151 Type 2 diabetes mellitus with diabetic peripheral angiopathy without gangrene: Secondary | ICD-10-CM | POA: Diagnosis not present

## 2019-08-28 DIAGNOSIS — F330 Major depressive disorder, recurrent, mild: Secondary | ICD-10-CM | POA: Diagnosis not present

## 2019-08-28 DIAGNOSIS — E1169 Type 2 diabetes mellitus with other specified complication: Secondary | ICD-10-CM | POA: Diagnosis not present

## 2019-09-01 ENCOUNTER — Other Ambulatory Visit: Payer: Self-pay | Admitting: Cardiology

## 2019-09-20 DIAGNOSIS — E1165 Type 2 diabetes mellitus with hyperglycemia: Secondary | ICD-10-CM | POA: Diagnosis not present

## 2019-09-20 DIAGNOSIS — J449 Chronic obstructive pulmonary disease, unspecified: Secondary | ICD-10-CM | POA: Diagnosis not present

## 2019-09-20 DIAGNOSIS — I739 Peripheral vascular disease, unspecified: Secondary | ICD-10-CM | POA: Diagnosis not present

## 2019-09-20 DIAGNOSIS — E1142 Type 2 diabetes mellitus with diabetic polyneuropathy: Secondary | ICD-10-CM | POA: Diagnosis not present

## 2019-09-20 DIAGNOSIS — I10 Essential (primary) hypertension: Secondary | ICD-10-CM | POA: Diagnosis not present

## 2019-09-20 DIAGNOSIS — Z299 Encounter for prophylactic measures, unspecified: Secondary | ICD-10-CM | POA: Diagnosis not present

## 2019-09-20 DIAGNOSIS — F17210 Nicotine dependence, cigarettes, uncomplicated: Secondary | ICD-10-CM | POA: Diagnosis not present

## 2019-10-10 ENCOUNTER — Other Ambulatory Visit: Payer: Self-pay | Admitting: "Endocrinology

## 2019-10-10 ENCOUNTER — Other Ambulatory Visit: Payer: Self-pay | Admitting: Cardiology

## 2019-10-17 ENCOUNTER — Other Ambulatory Visit: Payer: Self-pay

## 2019-10-17 ENCOUNTER — Ambulatory Visit: Payer: PPO | Admitting: "Endocrinology

## 2019-10-23 ENCOUNTER — Other Ambulatory Visit: Payer: Self-pay | Admitting: Cardiology

## 2019-11-03 ENCOUNTER — Telehealth: Payer: Self-pay

## 2019-11-03 NOTE — Telephone Encounter (Signed)
Left message for patient to remind of missed remote transmission.  

## 2019-11-13 ENCOUNTER — Encounter: Payer: Self-pay | Admitting: "Endocrinology

## 2019-11-13 ENCOUNTER — Ambulatory Visit (INDEPENDENT_AMBULATORY_CARE_PROVIDER_SITE_OTHER): Payer: PPO | Admitting: "Endocrinology

## 2019-11-13 ENCOUNTER — Other Ambulatory Visit: Payer: Self-pay

## 2019-11-13 VITALS — BP 119/71 | HR 84 | Ht 69.0 in | Wt 253.0 lb

## 2019-11-13 DIAGNOSIS — Z6837 Body mass index (BMI) 37.0-37.9, adult: Secondary | ICD-10-CM | POA: Diagnosis not present

## 2019-11-13 DIAGNOSIS — E1122 Type 2 diabetes mellitus with diabetic chronic kidney disease: Secondary | ICD-10-CM

## 2019-11-13 DIAGNOSIS — N183 Chronic kidney disease, stage 3 unspecified: Secondary | ICD-10-CM

## 2019-11-13 DIAGNOSIS — I10 Essential (primary) hypertension: Secondary | ICD-10-CM | POA: Diagnosis not present

## 2019-11-13 DIAGNOSIS — E1165 Type 2 diabetes mellitus with hyperglycemia: Secondary | ICD-10-CM

## 2019-11-13 DIAGNOSIS — F17200 Nicotine dependence, unspecified, uncomplicated: Secondary | ICD-10-CM | POA: Diagnosis not present

## 2019-11-13 DIAGNOSIS — E782 Mixed hyperlipidemia: Secondary | ICD-10-CM | POA: Diagnosis not present

## 2019-11-13 DIAGNOSIS — IMO0002 Reserved for concepts with insufficient information to code with codable children: Secondary | ICD-10-CM

## 2019-11-13 LAB — POCT GLYCOSYLATED HEMOGLOBIN (HGB A1C): Hemoglobin A1C: 7.8 % — AB (ref 4.0–5.6)

## 2019-11-13 MED ORDER — LANTUS SOLOSTAR 100 UNIT/ML ~~LOC~~ SOPN: 100.0000 [IU] | PEN_INJECTOR | Freq: Every day | SUBCUTANEOUS | 3 refills | Status: DC

## 2019-11-13 NOTE — Progress Notes (Signed)
11/13/2019            Endocrinology follow-up note   Subjective:    Patient ID: Dylan Tyler, male    DOB: 27-Apr-1946.  he is being seen in follow-up  for management of currently uncontrolled, complicated type 2 diabetes, hyperlipidemia, hypertension.   PMD:  Glenda Chroman, MD.   Past Medical History:  Diagnosis Date  . Ankylosing spondylitis ([place])   . Anxiety   . Aortic root dilatation (H[facility])    Mild  . AR (aortic regurgitation)    Mild  . Atrial fibrillation and flutter ([place])   . Bladder cancer ([place])   . Chronic pain    Followed at [facility]  . COPD (chronic obstructive pulmonary disease) ([place])   . DDD (degenerative disc disease), lumbosacral   . DDD (degenerative disc disease), thoracolumbar   . Degenerative cervical disc   . Depression   . Diabetes mellitus type II   . Diverticulitis   . Essential hypertension   . GERD (gastroesophageal reflux disease)   . History of kidney stones   . Hyperlipidemia   . Kidney stones   . Migraine   . Nonischemic cardiomyopathy ([place])    Cardiolite 12/09 LVEF 55%, minor luminal irregularities at cardiac catheterization  . OSA on CPAP    doesnt use any more, needs new machine and hasnt gotten one.  . Presence of permanent cardiac pacemaker   . Rheumatoid arthritis ([place])   . Symptomatic bradycardia    Medtronic Advisa L dual-chamber pacemaker  05/2015 - Dr. Curt Bears   Past Surgical History:  Procedure Laterality Date  . CARDIAC CATHETERIZATION  08/13/11  . CARDIOVERSION N/A 07/02/2015   Procedure: CARDIOVERSION;  Surgeon: Sanda Klein, MD;  Location: [facility];  Service: Cardiovascular;  Laterality: N/A;  . CATARACT EXTRACTION W/PHACO Left 02/10/2019   Procedure: CATARACT EXTRACTION PHACO AND INTRAOCULAR LENS PLACEMENT ([place]);  Surgeon: Baruch Goldmann, MD;  Location: [facility];  Service: Ophthalmology;  Laterality: Left;  CDE: 7.10  . CATARACT EXTRACTION W/PHACO Right 02/27/2019   Procedure: CATARACT EXTRACTION PHACO AND INTRAOCULAR  LENS PLACEMENT RIGHT EYE CDE=11.84;  Surgeon: Baruch Goldmann, MD;  Location: [facility];  Service: Ophthalmology;  Laterality: Right;  right  . CYSTOSCOPY WITH HOLMIUM LASER LITHOTRIPSY  1990's  . EP IMPLANTABLE DEVICE N/A 05/21/2015   MDT Advisa DR MRI pacemaker implanted by Dr Curt Bears  . KNEE ARTHROSCOPY Right ~ 1991  . LAPAROSCOPIC CHOLECYSTECTOMY  ~ 2008  . TRANSURETHRAL RESECTION OF BLADDER TUMOR WITH GYRUS (TURBT-GYRUS)  1990's?   Social History   Socioeconomic History  . Marital status: Divorced    Spouse name: Not on file  . Number of children: Not on file  . Years of education: Not on file  . Highest education level: Not on file  Occupational History  . Occupation: Disabled    Employer: DISABLED  Tobacco Use  . Smoking status: Current Every Day Smoker    Packs/day: 0.30    Years: 50.00    Pack years: 15.00    Types: E-cigarettes    Start date: 02/26/1959    Last attempt to quit: 12/06/2013    Years since quitting: 5.9  . Smokeless tobacco: Former Systems developer    Types: Chew  . Tobacco comment: 05/21/2015 "quit chewing in 1992"  Substance and Sexual Activity  . Alcohol use: Yes    Alcohol/week: 2.0 standard drinks    Types: 2 Cans of beer per week  . Drug use: Yes    Types:  Cocaine    Comment: 05/21/2015 "quit in the early 2000"  . Sexual activity: Not on file  Other Topics Concern  . Not on file  Social History Narrative   Divorced   No regular exercise   Social Determinants of Health   Financial Resource Strain:   . Difficulty of Paying Living Expenses:   Food Insecurity:   . Worried About Charity fundraiser in the Last Year:   . Arboriculturist in the Last Year:   Transportation Needs:   . Film/video editor (Medical):   Marland Kitchen Lack of Transportation (Non-Medical):   Physical Activity:   . Days of Exercise per Week:   . Minutes of Exercise per Session:   Stress:   . Feeling of Stress :   Social Connections:   . Frequency of Communication with Friends and  Family:   . Frequency of Social Gatherings with Friends and Family:   . Attends Religious Services:   . Active Member of Clubs or Organizations:   . Attends Archivist Meetings:   Marland Kitchen Marital Status:    Outpatient Encounter Medications as of 11/13/2019  Medication Sig  . ELIQUIS 5 MG TABS tablet TAKE ONE TABLET BY MOUTH TWICE DAILY  . acetaminophen (TYLENOL) 500 MG tablet Take 500 mg by mouth every 6 (six) hours as needed (pain).   Marland Kitchen albuterol (PROVENTIL) (2.5 MG/3ML) 0.083% nebulizer solution Take 2.5 mg by nebulization every 6 (six) hours as needed for wheezing.  . Alcohol Swabs 70 % PADS Use as directed  . ALPRAZolam (XANAX) 0.5 MG tablet Take 0.5 mg by mouth 2 (two) times daily as needed.  . carvedilol (COREG) 6.25 MG tablet TAKE ONE TABLET BY MOUTH TWICE DAILY. TAKE WITH A MEAL.  Marland Kitchen Cholecalciferol (VITAMIN D-3) 1000 UNITS CAPS Take 1,000 Units by mouth 2 (two) times daily.   . cholestyramine (QUESTRAN) 4 g packet Take 4 g by mouth 3 (three) times daily with meals.  . citalopram (CELEXA) 40 MG tablet Take 20-40 mg by mouth daily.   . Continuous Blood Gluc Receiver (FREESTYLE LIBRE 14 DAY READER) DEVI USE AS DIRECTED.  Marland Kitchen Continuous Blood Gluc Sensor (FREESTYLE LIBRE 14 DAY SENSOR) MISC APPLY ONE SENSOR EVERY 14 DAYS.  Marland Kitchen ENTRESTO 24-26 MG TAKE ONE TABLET BY MOUTH TWICE DAILY  . FREESTYLE LITE test strip USE TO CHECK BLOOD SUGAR FOUR TIMES DAILY.  . furosemide (LASIX) 40 MG tablet TAKE ONE TABLET (40MG ) BY MOUTH EVERY *OTHER* DAY ALTERNATING WITH ONE-HALF TABLET (20MG )  . gabapentin (NEURONTIN) 400 MG capsule Take 400 mg by mouth 3 (three) times daily.   . insulin glargine (LANTUS SOLOSTAR) 100 UNIT/ML Solostar Pen Inject 100 Units into the skin at bedtime.  . Insulin Pen Needle (B-D ULTRAFINE III SHORT PEN) 31G X 8 MM MISC Use to inject insulin 4 x daily  . Insulin Pen Needle (BD PEN NEEDLE NANO U/F) 32G X 4 MM MISC 1 each by Other route 4 (four) times daily.  Marland Kitchen  ipratropium-albuterol (DUONEB) 0.5-2.5 (3) MG/3ML SOLN Take 3 mLs by nebulization every 4 (four) hours as needed (for shortness of breath).  . Lancets (FREESTYLE) lancets USE TO CHECK BLOOD SUGAR FOUR TIMES DAILY.  Marland Kitchen metoCLOPramide (REGLAN) 5 MG tablet Take 5 mg by mouth every 6 (six) hours as needed for nausea or vomiting.   . nitroGLYCERIN (NITROSTAT) 0.4 MG SL tablet Place 1 tablet (0.4 mg total) under the tongue every 5 (five) minutes as needed for chest pain.  Marland Kitchen  NON FORMULARY 1 each by Other route See admin instructions. CPAP - Uses nightly  . NOVOLOG FLEXPEN 100 UNIT/ML FlexPen INJECT 25-31 UNITS SUBCUTANEOUSLY THREE TIMES DAILY AS DIRECTED WITH MEALS.  . simvastatin (ZOCOR) 40 MG tablet TAKE ONE TABLET BY MOUTH AT BEDTIME  . spironolactone (ALDACTONE) 25 MG tablet Take 0.5 tablets (12.5 mg total) by mouth daily.  . traMADol (ULTRAM) 50 MG tablet Take 1 tablet by mouth daily as needed (pain).   . TRELEGY ELLIPTA 100-62.5-25 MCG/INH AEPB Inhale 1 puff into the lungs daily.  . vitamin B-12 (CYANOCOBALAMIN) 1000 MCG tablet Take 1,000 mcg by mouth 2 (two) times daily.    . VOLTAREN 1 % GEL Apply 2 g topically 4 (four) times daily as needed (for pain).   . [DISCONTINUED] LANTUS SOLOSTAR 100 UNIT/ML Solostar Pen INJECT 90 UNITS INTO SKIN DAILY AT 10PM.   No facility-administered encounter medications on file as of 11/13/2019.    ALLERGIES: No Known Allergies  VACCINATION STATUS:  There is no immunization history on file for this patient.  Diabetes He presents for his follow-up diabetic visit. He has type 2 diabetes mellitus. Onset time: He was diagnosed at approximate age of 35 years. His disease course has been worsening (His most recent A1c was 9.8% on 01/12/2017.). There are no hypoglycemic associated symptoms. Pertinent negatives for hypoglycemia include no confusion, pallor or seizures. Associated symptoms include foot paresthesias, polydipsia and polyuria. Pertinent negatives for  diabetes include no fatigue, no polyphagia and no weakness. There are no hypoglycemic complications. Symptoms are worsening. Diabetic complications include peripheral neuropathy. Risk factors for coronary artery disease include dyslipidemia, diabetes mellitus, hypertension, male sex, sedentary lifestyle, tobacco exposure and family history. Current diabetic treatment includes oral agent (dual therapy). He is following a generally unhealthy diet. When asked about meal planning, he reported none. He has not had a previous visit with a dietitian. He never (He is wheelchair-bound due to deconditioning, and due todegenerative joint disease.) participates in exercise. His home blood glucose trend is increasing steadily. His breakfast blood glucose range is generally 180-200 mg/dl. His lunch blood glucose range is generally 180-200 mg/dl. His dinner blood glucose range is generally 180-200 mg/dl. His bedtime blood glucose range is generally 180-200 mg/dl. His overall blood glucose range is 180-200 mg/dl. An ACE inhibitor/angiotensin II receptor blocker is being taken. He sees a podiatrist. Hyperlipidemia This is a chronic problem. The current episode started more than 1 year ago. Exacerbating diseases include diabetes and obesity. Factors aggravating his hyperlipidemia include smoking. Pertinent negatives include no myalgias. Current antihyperlipidemic treatment includes statins. Risk factors for coronary artery disease include dyslipidemia, diabetes mellitus, hypertension, a sedentary lifestyle, male sex and obesity.    Review of systems  Constitutional: + Minimally fluctuating body weight,  current  Body mass index is 37.36 kg/m. , no fatigue, no subjective hyperthermia, no subjective hypothermia Eyes: no blurry vision, no xerophthalmia ENT: no sore throat, no nodules palpated in throat, no dysphagia/odynophagia, no hoarseness Cardiovascular: no Chest Pain, no Shortness of Breath, no palpitations, no leg  swelling Respiratory: no cough, no shortness of breath Gastrointestinal: no Nausea/Vomiting/Diarhhea Musculoskeletal: no muscle/joint aches Skin: no rashes, no hyperemia Neurological: no tremors, no numbness, no tingling, no dizziness Psychiatric: no depression, no anxiety   Objective:    BP 119/71   Pulse 84   Ht 5\' 9"  (1.753 m)   Wt 253 lb (114.8 kg)   BMI 37.36 kg/m   Wt Readings from Last 3 Encounters:  11/13/19 253 lb (114.8  kg)  08/08/19 257 lb (116.6 kg)  05/08/19 247 lb (112 kg)     Physical Exam- Limited  Constitutional:  Body mass index is 37.36 kg/m. , not in acute distress, normal state of mind Eyes:  EOMI, no exophthalmos Neck: Supple Thyroid: No gross goiter Respiratory: Adequate breathing efforts Musculoskeletal:  +Walks with a cane, no gross deformities, strength intact in all four extremities, no gross restriction of joint movements Skin:  no rashes, no hyperemia Neurological: no tremor with outstretched hands,    Lipid Panel     Component Value Date/Time   CHOL 111 12/06/2017 0000   TRIG 423 (A) 12/06/2017 0000   HDL 33 (A) 12/06/2017 0000   Recent Results (from the past 2160 hour(s))  HgB A1c     Status: Abnormal   Collection Time: 11/13/19  2:14 PM  Result Value Ref Range   Hemoglobin A1C 7.8 (A) 4.0 - 5.6 %   HbA1c POC (<> result, manual entry)     HbA1c, POC (prediabetic range)     HbA1c, POC (controlled diabetic range)     CMP Latest Ref Rng & Units 06/08/2019 02/08/2019 02/08/2019  Glucose 70 - 99 mg/dL - 138(H) 144(H)  BUN 4 - 21 20 21 22   Creatinine 0.6 - 1.3 1.6(A) 1.63(H) 1.65(H)  Sodium 137 - 147 138 139 138  Potassium 3.4 - 5.3 5.2 4.1 4.0  Chloride 99 - 108 102 100 101  CO2 13 - 22 25(A) 26 25  Calcium 8.7 - 10.7 9.4 8.5(L) 8.4(L)  Total Protein 6.5 - 8.1 g/dL - 6.4(L) -  Total Bilirubin 0.3 - 1.2 mg/dL - 1.0 -  Alkaline Phos 25 - 125 81 54 -  AST 14 - 40 21 19 -  ALT 10 - 40 38 17 -     Assessment & Plan:   1. Type 2  diabetes mellitus with stage 3 chronic kidney disease, without long-term current use of insulin ([place])  - Patient has currently uncontrolled symptomatic type 2 DM since  74 years of age. -He presents with his logs showing above target glycemic profile.  His point-of-care A1c 7.8%, overall improving from 9.7%.    -his diabetes is complicated by stage 3 renal insufficiency, obesity/sedentary life, neuropathy, cardiomyopathy, chronic heavy smoking, inadequate insurance and Dylan Tyler remains at a high risk for more acute and chronic complications which include CAD, CVA, CKD, retinopathy, and neuropathy. These are all discussed in detail with the patient.  - I have counseled him on diet management and weight loss, by adopting a carbohydrate restricted/protein rich diet.  - he  admits there is a room for improvement in his diet and drink choices. -  Suggestion is made for him to avoid simple carbohydrates  from his diet including Cakes, Sweet Desserts / Pastries, Ice Cream, Soda (diet and regular), Sweet Tea, Candies, Chips, Cookies, Sweet Pastries,  Store Bought Juices, Alcohol in Excess of  1-2 drinks a day, Artificial Sweeteners, Coffee Creamer, and "Sugar-free" Products. This will help patient to have stable blood glucose profile and potentially avoid unintended weight gain.   - I encouraged him to switch to  unprocessed or minimally processed complex starch and increased protein intake (animal or plant source), fruits, and vegetables.  - he is advised to stick to a routine mealtimes to eat 3 meals  a day and avoid unnecessary snacks ( to snack only to correct hypoglycemia).   - I have approached him with the following individualized plan to manage diabetes  and patient agrees:   - Based on his glucose profile, he will continue to need intensive treatment with higher dose of basal/bolus insulin in order for him to achieve control of diabetes to target.    -He is advised to increase Lantus 100  units nightly, advised to continue NovoLog 25 units  units 3 times a day before meals for pre-meal blood glucose readings above 90 mg/dL (with additional correction for readings above 150 mg/dL)  associated with strict monitoring of blood glucose 4 times a day- before meals and at bedtime.  -He is currently using freestyle libre CGM device, advised to use this device at all times.  -Patient is encouraged to call clinic for blood glucose levels less than 70 or above 300 mg /dl. - He is not a candidate for metformin therapy due to CKD.  - He  will be considered for incretin therapy when he gets appropriate insurance coverage.   2) Lipids/HPL: He has significant hypertriglyceridemia.  He is advised to continue simvastatin 40 mg p.o. nightly along with his  cholestyramine 4 g 3 times a day.    3 )hypertension His blood pressure is controlled to target. -He is currently on carvedilol 6.25 mg p.o. twice daily, Lasix as needed, spironolactone 12.50 mg p.o. daily   4) Chronic Care/Health Maintenance:  -he  is on ACEI/ARB and Statin medications and  is encouraged to continue to follow up with Ophthalmology, Dentist,  Podiatrist at least yearly or according to recommendations. - I have recommended yearly flu vaccine and pneumonia vaccination at least every 5 years; and  sleep for at least 7 hours a day.  The patient was counseled on the dangers of tobacco use, and was advised to quit.  Reviewed strategies to maximize success, including removing cigarettes and smoking materials from environment.   5) Weight management: His BMI 37.3.  He is a candidate for modest weight loss.  Exercise regimen and carbs restrictions were detailed with him.   - I advised patient to maintain close follow up with Glenda Chroman, MD for primary care needs.  - Patient Care Time Today:  25 min, of which >50% was spent in  counseling and the rest reviewing his  current and  previous labs/studies, previous treatments, his blood  glucose readings, and medications' doses and developing a plan for long-term care based on the latest recommendations for standards of care.   Dylan Tyler participated in the discussions, expressed understanding, and voiced agreement with the above plans.  All questions were answered to his satisfaction. he is encouraged to contact clinic should he have any questions or concerns prior to his return visit.   Follow up plan: - Return in about 4 months (around 03/15/2020) for Bring Meter and Logs- A1c in Office, Follow up with Pre-visit Labs.  Glade Lloyd, MD Phone: [phone]  Fax: [phone]   11/13/2019, 3:54 PM This note was partially dictated with voice recognition software. Similar sounding words can be transcribed inadequately or may not  be corrected upon review.

## 2019-11-13 NOTE — Patient Instructions (Signed)

## 2019-11-20 ENCOUNTER — Other Ambulatory Visit: Payer: Self-pay | Admitting: "Endocrinology

## 2019-11-24 ENCOUNTER — Ambulatory Visit (INDEPENDENT_AMBULATORY_CARE_PROVIDER_SITE_OTHER): Payer: PPO | Admitting: *Deleted

## 2019-11-24 DIAGNOSIS — R001 Bradycardia, unspecified: Secondary | ICD-10-CM | POA: Diagnosis not present

## 2019-11-27 LAB — CUP PACEART REMOTE DEVICE CHECK
Battery Remaining Longevity: 33 mo
Battery Voltage: 2.96 V
Brady Statistic AP VP Percent: 0 %
Brady Statistic AP VS Percent: 0 %
Brady Statistic AS VP Percent: 91.67 %
Brady Statistic AS VS Percent: 8.33 %
Brady Statistic RA Percent Paced: 0 %
Brady Statistic RV Percent Paced: 92.61 %
Date Time Interrogation Session: 20210521155656
Implantable Lead Implant Date: 20161115
Implantable Lead Implant Date: 20161115
Implantable Lead Location: 753859
Implantable Lead Location: 753860
Implantable Lead Model: 5076
Implantable Lead Model: 5076
Implantable Pulse Generator Implant Date: 20161115
Lead Channel Impedance Value: 380 Ohm
Lead Channel Impedance Value: 399 Ohm
Lead Channel Impedance Value: 475 Ohm
Lead Channel Impedance Value: 532 Ohm
Lead Channel Pacing Threshold Amplitude: 0.5 V
Lead Channel Pacing Threshold Amplitude: 0.75 V
Lead Channel Pacing Threshold Pulse Width: 0.4 ms
Lead Channel Pacing Threshold Pulse Width: 0.4 ms
Lead Channel Sensing Intrinsic Amplitude: 3 mV
Lead Channel Sensing Intrinsic Amplitude: 3 mV
Lead Channel Sensing Intrinsic Amplitude: 7.125 mV
Lead Channel Sensing Intrinsic Amplitude: 7.125 mV
Lead Channel Setting Pacing Amplitude: 2.5 V
Lead Channel Setting Pacing Pulse Width: 0.4 ms
Lead Channel Setting Sensing Sensitivity: 2.8 mV

## 2019-11-27 NOTE — Progress Notes (Signed)
Remote pacemaker transmission.   

## 2019-12-06 ENCOUNTER — Other Ambulatory Visit: Payer: Self-pay

## 2019-12-06 ENCOUNTER — Ambulatory Visit (INDEPENDENT_AMBULATORY_CARE_PROVIDER_SITE_OTHER): Payer: PPO

## 2019-12-06 DIAGNOSIS — I429 Cardiomyopathy, unspecified: Secondary | ICD-10-CM | POA: Diagnosis not present

## 2019-12-07 ENCOUNTER — Other Ambulatory Visit: Payer: PPO

## 2019-12-08 ENCOUNTER — Other Ambulatory Visit: Payer: Self-pay

## 2019-12-08 ENCOUNTER — Encounter: Payer: Self-pay | Admitting: Cardiology

## 2019-12-08 ENCOUNTER — Ambulatory Visit (INDEPENDENT_AMBULATORY_CARE_PROVIDER_SITE_OTHER): Payer: PPO | Admitting: Cardiology

## 2019-12-08 VITALS — BP 128/64 | HR 76 | Ht 69.0 in | Wt 250.4 lb

## 2019-12-08 DIAGNOSIS — Z950 Presence of cardiac pacemaker: Secondary | ICD-10-CM

## 2019-12-08 DIAGNOSIS — N1832 Chronic kidney disease, stage 3b: Secondary | ICD-10-CM | POA: Diagnosis not present

## 2019-12-08 DIAGNOSIS — I429 Cardiomyopathy, unspecified: Secondary | ICD-10-CM

## 2019-12-08 DIAGNOSIS — I4821 Permanent atrial fibrillation: Secondary | ICD-10-CM | POA: Diagnosis not present

## 2019-12-08 NOTE — Progress Notes (Signed)
Cardiology Office Note  Date: 12/08/2019   ID: Dylan Tyler, Dylan Tyler Mar 30, 1946, MRN [id number]  PCP:  Glenda Chroman, MD  Cardiologist:  Rozann Lesches, MD Electrophysiologist:  Thompson Grayer, MD   Chief Complaint  Patient presents with  . Cardiac follow-up    History of Present Illness: Dylan Tyler is a 74 y.o. male last seen in February.  He presents for a routine visit.  He does not report any palpitations or chest pain.  Still struggles with chronic back and knee pain, using a cane today.  He sees Dr. [name], [employer] pacemaker in place.  Recent device check in May indicated normal function.  Follow-up echocardiogram done recently showed some improvement in LVEF, now 40 to 45% range, mild aortic regurgitation.  I reviewed his cardiac regimen which is outlined below.  He is due for follow-up lab work.  Past Medical History:  Diagnosis Date  . Ankylosing spondylitis ([place])   . Anxiety   . Aortic root dilatation ([place])   . AR (aortic regurgitation)   . Atrial fibrillation and flutter ([place])   . Bladder cancer ([place])   . Chronic pain    Follow[facility] at [facility]  . COPD (chronic obstructive pulmonary disease) ([place])   . DDD (degenerative disc disease), lumbosacral   . DDD (degenerative disc disease), thoracolumbar   . Degenerative cervical disc   . Depression   . Diabetes mellitus type II   . Diverticulitis   . Essential hypertension   . GERD (gastroesophageal reflux disease)   . History of kidney stones   . Hyperlipidemia   . Migraine   . Nonischemic cardiomyopathy ([place])    Cardiolite 12/09 LVEF 55%, minor luminal irregularities at cardiac catheterization  . OSA on CPAP   . Rheumatoid arthritis ([place])   . Symptomatic bradycardia    Medtronic Advisa L dual-chamber pacemaker  05/2015 - Dr. Curt Bears    Past Surgical History:  Procedure Laterality Date  . CARDIAC CATHETERIZATION  08/13/11  . CARDIOVERSION N/A 07/02/2015   Procedure: CARDIOVERSION;  Surgeon: Sanda Klein,  MD;  Location: [facility];  Service: Cardiovascular;  Laterality: N/A;  . CATARACT EXTRACTION W/PHACO Left 02/10/2019   Procedure: CATARACT EXTRACTION PHACO AND INTRAOCULAR LENS PLACEMENT ([place]);  Surgeon: Baruch Goldmann, MD;  Location: [facility];  Service: Ophthalmology;  Laterality: Left;  CDE: 7.10  . CATARACT EXTRACTION W/PHACO Right 02/27/2019   Procedure: CATARACT EXTRACTION PHACO AND INTRAOCULAR LENS PLACEMENT RIGHT EYE CDE=11.84;  Surgeon: Baruch Goldmann, MD;  Location: [facility];  Service: Ophthalmology;  Laterality: Right;  right  . CYSTOSCOPY WITH HOLMIUM LASER LITHOTRIPSY  1990's  . EP IMPLANTABLE DEVICE N/A 05/21/2015   MDT Advisa DR MRI pacemaker implanted by Dr Curt Bears  . KNEE ARTHROSCOPY Right ~ 1991  . LAPAROSCOPIC CHOLECYSTECTOMY  ~ 2008  . TRANSURETHRAL RESECTION OF BLADDER TUMOR WITH GYRUS (TURBT-GYRUS)  1990's?    Current Outpatient Medications  Medication Sig Dispense Refill  . acetaminophen (TYLENOL) 500 MG tablet Take 500 mg by mouth every 6 (six) hours as needed (pain).     Marland Kitchen albuterol (PROVENTIL) (2.5 MG/3ML) 0.083% nebulizer solution Take 2.5 mg by nebulization every 6 (six) hours as needed for wheezing.    . Alcohol Swabs 70 % PADS Use as directed 120 each 2  . ALPRAZolam (XANAX) 0.5 MG tablet Take 0.5 mg by mouth 2 (two) times daily as needed.  1  . carvedilol (COREG) 6.25 MG tablet TAKE ONE TABLET BY MOUTH TWICE DAILY. TAKE WITH A MEAL.  60 tablet 6  . Cholecalciferol (VITAMIN D-3) 1000 UNITS CAPS Take 1,000 Units by mouth 2 (two) times daily.     . cholestyramine (QUESTRAN) 4 g packet Take 4 g by mouth 3 (three) times daily with meals.    . citalopram (CELEXA) 40 MG tablet Take 20-40 mg by mouth daily.     . Continuous Blood Gluc Receiver (FREESTYLE LIBRE 14 DAY READER) DEVI USE AS DIRECTED. 1 each 0  . Continuous Blood Gluc Sensor (FREESTYLE LIBRE 14 DAY SENSOR) MISC APPLY ONE SENSOR EVERY 14 DAYS. 6 each 2  . ELIQUIS 5 MG TABS tablet TAKE ONE TABLET BY MOUTH TWICE  DAILY 60 tablet 6  . ENTRESTO 24-26 MG TAKE ONE TABLET BY MOUTH TWICE DAILY 60 tablet 6  . FREESTYLE LITE test strip USE TO CHECK BLOOD SUGAR FOUR TIMES DAILY. 100 each 5  . furosemide (LASIX) 40 MG tablet TAKE ONE TABLET (40MG ) BY MOUTH EVERY *OTHER* DAY ALTERNATING WITH ONE-HALF TABLET (20MG ) 45 tablet 1  . gabapentin (NEURONTIN) 400 MG capsule Take 400 mg by mouth 3 (three) times daily.     . insulin glargine (LANTUS SOLOSTAR) 100 UNIT/ML Solostar Pen Inject 100 Units into the skin at bedtime. 30 mL 3  . Insulin Pen Needle (B-D ULTRAFINE III SHORT PEN) 31G X 8 MM MISC Use to inject insulin 4 x daily 200 each 3  . Insulin Pen Needle (BD PEN NEEDLE NANO U/F) 32G X 4 MM MISC 1 each by Other route 4 (four) times daily. 200 each 5  . ipratropium-albuterol (DUONEB) 0.5-2.5 (3) MG/3ML SOLN Take 3 mLs by nebulization every 4 (four) hours as needed (for shortness of breath).    . Lancets (FREESTYLE) lancets USE TO CHECK BLOOD SUGAR FOUR TIMES DAILY. 200 each 5  . metoCLOPramide (REGLAN) 5 MG tablet Take 5 mg by mouth every 6 (six) hours as needed for nausea or vomiting.     . nitroGLYCERIN (NITROSTAT) 0.4 MG SL tablet Place 1 tablet (0.4 mg total) under the tongue every 5 (five) minutes as needed for chest pain. 25 tablet 3  . NON FORMULARY 1 each by Other route See admin instructions. CPAP - Uses nightly    . NOVOLOG FLEXPEN 100 UNIT/ML FlexPen INJECT 25-31 UNITS SUBCUTANEOUSLY THREE TIMES DAILY AS DIRECTED WITH MEALS 30 mL 0  . simvastatin (ZOCOR) 40 MG tablet TAKE ONE TABLET BY MOUTH AT BEDTIME 90 tablet 3  . traMADol (ULTRAM) 50 MG tablet Take 1 tablet by mouth daily as needed (pain).     . TRELEGY ELLIPTA 100-62.5-25 MCG/INH AEPB Inhale 1 puff into the lungs daily.    . vitamin B-12 (CYANOCOBALAMIN) 1000 MCG tablet Take 1,000 mcg by mouth 2 (two) times daily.      . VOLTAREN 1 % GEL Apply 2 g topically 4 (four) times daily as needed (for pain).     Marland Kitchen spironolactone (ALDACTONE) 25 MG tablet Take 0.5  tablets (12.5 mg total) by mouth daily. 45 tablet 2   No current facility-administered medications for this visit.   Allergies:  Patient has no known allergies.   ROS:   No palpitations or syncope.  Physical Exam: VS:  BP 128/64   Pulse 76   Ht 5\' 9"  (1.753 m)   Wt 250 lb 6.4 oz (113.6 kg)   SpO2 95%   BMI 36.98 kg/m , BMI Body mass index is 36.98 kg/m.  Wt Readings from Last 3 Encounters:  12/08/19 250 lb 6.4 oz (113.6 kg)  11/13/19  253 lb (114.8 kg)  08/08/19 257 lb (116.6 kg)    General: Patient appears comfortable at rest.  Using a cane. HEENT: Conjunctiva and lids normal, wearing a mask. Neck: Supple, no elevated JVP or carotid bruits, no thyromegaly. Lungs: Clear to auscultation, nonlabored breathing at rest. Cardiac: Regular rate and rhythm, no S3, soft systolic murmur, no pericardial rub. Extremities: Trace ankle edema, distal pulses 2+.  ECG:  An ECG dated 01/19/2019 was personally reviewed today and demonstrated:  Ventricular pacing with underlying atrial fibrillation/flutter and PVCs.  Recent Labwork: 06/08/2019: ALT 38; AST 21; BUN 20; Creatinine 1.6; Potassium 5.2; Sodium 138  May 2021: Hemoglobin A1c 7.8%  Other Studies Reviewed Today:  Echocardiogram 12/06/2019: 1. Left ventricular ejection fraction, by estimation, is 40 to 45%. The  left ventricle has mildly decreased function. The left ventricle  demonstrates global hypokinesis. There is mild left ventricular  hypertrophy. Left ventricular diastolic parameters  are indeterminate.  2. Right ventricular systolic function is low normal. The right  ventricular size is normal. Mildly increased right ventricular wall  thickness. There is normal pulmonary artery systolic pressure. The  estimated right ventricular systolic pressure is 10.6  mmHg.  3. Left atrial size was upper normal.  4. The mitral valve is grossly normal. Trivial mitral valve  regurgitation.  5. The aortic valve is tricuspid. Aortic  valve regurgitation is mild.  Mild aortic valve sclerosis is present, with no evidence of aortic valve  stenosis.  6. The inferior vena cava is normal in size with greater than 50%  respiratory variability, suggesting right atrial pressure of 3 mmHg.   Assessment and Plan:  1.  Nonischemic cardiomyopathy with improvement in LVEF to the range of 40 to 45%.  Plan to continue Coreg, Entresto, Aldactone, and Lasix.  Check BMET.  2.  Permanent atrial fibrillation/flutter.  He is asymptomatic in terms of palpitations and remains on Eliquis for stroke prophylaxis.  Check CBC.  3.  CKD stage IIIb, last creatinine 1.6.  Medication Adjustments/Labs and Tests Ordered: Current medicines are reviewed at length with the patient today.  Concerns regarding medicines are outlined above.   Tests Ordered: Orders Placed This Encounter  Procedures  . Basic metabolic panel  . CBC  . EKG 12-Lead    Medication Changes: No orders of the defined types were placed in this encounter.   Disposition:  Follow up 6 months in the [place] office.  Signed, [name], MD, [facility] 12/08/2019 2:25 PM    [facility] [place], [place], [place] [address] Phone: [phone]; Fax: [phone]

## 2019-12-08 NOTE — Patient Instructions (Addendum)
Medication Instructions:   Your physician recommends that you continue on your current medications as directed. Please refer to the Current Medication list given to you today.  Labwork:  Your physician recommends that you return for non-fasting lab work in: as soon as possible to check your BMET & CBC. This may be done at [facility] any day Monday-Friday from 8:00 am - 4:00 pm.  Testing/Procedures:  NONE  Follow-Up:  Your physician recommends that you schedule a follow-up appointment in: 6 months (office).  Any Other Special Instructions Will Be Listed Below (If Applicable).  If you need a refill on your cardiac medications before your next appointment, please call your pharmacy.

## 2019-12-27 DIAGNOSIS — E1142 Type 2 diabetes mellitus with diabetic polyneuropathy: Secondary | ICD-10-CM | POA: Diagnosis not present

## 2019-12-27 DIAGNOSIS — F17210 Nicotine dependence, cigarettes, uncomplicated: Secondary | ICD-10-CM | POA: Diagnosis not present

## 2019-12-27 DIAGNOSIS — I4891 Unspecified atrial fibrillation: Secondary | ICD-10-CM | POA: Diagnosis not present

## 2019-12-27 DIAGNOSIS — Z299 Encounter for prophylactic measures, unspecified: Secondary | ICD-10-CM | POA: Diagnosis not present

## 2019-12-27 DIAGNOSIS — E1165 Type 2 diabetes mellitus with hyperglycemia: Secondary | ICD-10-CM | POA: Diagnosis not present

## 2019-12-27 DIAGNOSIS — J449 Chronic obstructive pulmonary disease, unspecified: Secondary | ICD-10-CM | POA: Diagnosis not present

## 2019-12-27 DIAGNOSIS — I10 Essential (primary) hypertension: Secondary | ICD-10-CM | POA: Diagnosis not present

## 2020-01-03 DIAGNOSIS — E1165 Type 2 diabetes mellitus with hyperglycemia: Secondary | ICD-10-CM | POA: Diagnosis not present

## 2020-01-30 ENCOUNTER — Encounter: Payer: Self-pay | Admitting: *Deleted

## 2020-01-30 DIAGNOSIS — Z950 Presence of cardiac pacemaker: Secondary | ICD-10-CM | POA: Diagnosis not present

## 2020-01-30 DIAGNOSIS — I429 Cardiomyopathy, unspecified: Secondary | ICD-10-CM | POA: Diagnosis not present

## 2020-01-30 DIAGNOSIS — I4821 Permanent atrial fibrillation: Secondary | ICD-10-CM | POA: Diagnosis not present

## 2020-01-31 ENCOUNTER — Telehealth: Payer: Self-pay | Admitting: *Deleted

## 2020-01-31 NOTE — Telephone Encounter (Signed)
-----   Message from [name], MD sent at 01/30/2020  5:04 PM EDT ----- Results reviewed.  Creatinine up to 1.9 and potassium elevated at 5.3.  No drop in hemoglobin.  Let's stop his Aldactone completely, otherwise continue present regimen.

## 2020-01-31 NOTE — Telephone Encounter (Signed)
Patient informed and verbalized understanding of plan. Copy sent to PCP 

## 2020-02-02 DIAGNOSIS — E1165 Type 2 diabetes mellitus with hyperglycemia: Secondary | ICD-10-CM | POA: Diagnosis not present

## 2020-02-14 DIAGNOSIS — Z299 Encounter for prophylactic measures, unspecified: Secondary | ICD-10-CM | POA: Diagnosis not present

## 2020-02-14 DIAGNOSIS — I739 Peripheral vascular disease, unspecified: Secondary | ICD-10-CM | POA: Diagnosis not present

## 2020-02-14 DIAGNOSIS — I10 Essential (primary) hypertension: Secondary | ICD-10-CM | POA: Diagnosis not present

## 2020-02-14 DIAGNOSIS — I4891 Unspecified atrial fibrillation: Secondary | ICD-10-CM | POA: Diagnosis not present

## 2020-02-14 DIAGNOSIS — J449 Chronic obstructive pulmonary disease, unspecified: Secondary | ICD-10-CM | POA: Diagnosis not present

## 2020-02-14 DIAGNOSIS — M459 Ankylosing spondylitis of unspecified sites in spine: Secondary | ICD-10-CM | POA: Diagnosis not present

## 2020-02-27 ENCOUNTER — Other Ambulatory Visit: Payer: Self-pay | Admitting: "Endocrinology

## 2020-02-28 ENCOUNTER — Ambulatory Visit (INDEPENDENT_AMBULATORY_CARE_PROVIDER_SITE_OTHER): Payer: PPO | Admitting: *Deleted

## 2020-02-28 DIAGNOSIS — I4821 Permanent atrial fibrillation: Secondary | ICD-10-CM

## 2020-02-28 DIAGNOSIS — I429 Cardiomyopathy, unspecified: Secondary | ICD-10-CM

## 2020-02-29 LAB — CUP PACEART REMOTE DEVICE CHECK
Battery Remaining Longevity: 30 mo
Battery Voltage: 2.96 V
Brady Statistic RA Percent Paced: 0 %
Brady Statistic RV Percent Paced: 95.55 %
Date Time Interrogation Session: 20210824200504
Implantable Lead Implant Date: 20161115
Implantable Lead Implant Date: 20161115
Implantable Lead Location: 753859
Implantable Lead Location: 753860
Implantable Lead Model: 5076
Implantable Lead Model: 5076
Implantable Pulse Generator Implant Date: 20161115
Lead Channel Impedance Value: 361 Ohm
Lead Channel Impedance Value: 361 Ohm
Lead Channel Impedance Value: 456 Ohm
Lead Channel Impedance Value: 494 Ohm
Lead Channel Pacing Threshold Amplitude: 0.5 V
Lead Channel Pacing Threshold Amplitude: 0.875 V
Lead Channel Pacing Threshold Pulse Width: 0.4 ms
Lead Channel Pacing Threshold Pulse Width: 0.4 ms
Lead Channel Sensing Intrinsic Amplitude: 2 mV
Lead Channel Sensing Intrinsic Amplitude: 2 mV
Lead Channel Sensing Intrinsic Amplitude: 23.875 mV
Lead Channel Sensing Intrinsic Amplitude: 23.875 mV
Lead Channel Setting Pacing Amplitude: 2.5 V
Lead Channel Setting Pacing Pulse Width: 0.4 ms
Lead Channel Setting Sensing Sensitivity: 2.8 mV

## 2020-03-05 ENCOUNTER — Other Ambulatory Visit: Payer: Self-pay | Admitting: "Endocrinology

## 2020-03-05 DIAGNOSIS — E1165 Type 2 diabetes mellitus with hyperglycemia: Secondary | ICD-10-CM | POA: Diagnosis not present

## 2020-03-05 NOTE — Progress Notes (Signed)
Remote pacemaker transmission.   

## 2020-03-07 ENCOUNTER — Other Ambulatory Visit: Payer: Self-pay | Admitting: Cardiology

## 2020-03-15 DIAGNOSIS — N186 End stage renal disease: Secondary | ICD-10-CM | POA: Diagnosis not present

## 2020-03-15 DIAGNOSIS — M1711 Unilateral primary osteoarthritis, right knee: Secondary | ICD-10-CM | POA: Diagnosis not present

## 2020-03-15 DIAGNOSIS — M170 Bilateral primary osteoarthritis of knee: Secondary | ICD-10-CM | POA: Diagnosis not present

## 2020-03-15 DIAGNOSIS — M25512 Pain in left shoulder: Secondary | ICD-10-CM | POA: Diagnosis not present

## 2020-03-15 DIAGNOSIS — I1310 Hypertensive heart and chronic kidney disease without heart failure, with stage 1 through stage 4 chronic kidney disease, or unspecified chronic kidney disease: Secondary | ICD-10-CM | POA: Diagnosis not present

## 2020-03-15 DIAGNOSIS — E1122 Type 2 diabetes mellitus with diabetic chronic kidney disease: Secondary | ICD-10-CM | POA: Diagnosis not present

## 2020-03-15 DIAGNOSIS — E1165 Type 2 diabetes mellitus with hyperglycemia: Secondary | ICD-10-CM | POA: Diagnosis not present

## 2020-03-15 DIAGNOSIS — E1100 Type 2 diabetes mellitus with hyperosmolarity without nonketotic hyperglycemic-hyperosmolar coma (NKHHC): Secondary | ICD-10-CM | POA: Diagnosis not present

## 2020-03-15 DIAGNOSIS — N183 Chronic kidney disease, stage 3 unspecified: Secondary | ICD-10-CM | POA: Diagnosis not present

## 2020-03-15 DIAGNOSIS — M1712 Unilateral primary osteoarthritis, left knee: Secondary | ICD-10-CM | POA: Diagnosis not present

## 2020-03-15 DIAGNOSIS — W19XXXA Unspecified fall, initial encounter: Secondary | ICD-10-CM | POA: Diagnosis not present

## 2020-03-15 LAB — COMPREHENSIVE METABOLIC PANEL
Calcium: 9.3 (ref 8.7–10.7)
GFR calc Af Amer: 34
GFR calc non Af Amer: 29

## 2020-03-15 LAB — LIPID PANEL
Cholesterol: 125 (ref 0–200)
HDL: 30 — AB (ref 35–70)
LDL Cholesterol: 49
Triglycerides: 229 — AB (ref 40–160)

## 2020-03-15 LAB — BASIC METABOLIC PANEL
BUN: 22 — AB (ref 4–21)
Creatinine: 2.2 — AB (ref 0.6–1.3)
Potassium: 5 (ref 3.4–5.3)

## 2020-03-15 LAB — TSH: TSH: 2.65 (ref 0.41–5.90)

## 2020-03-15 LAB — MICROALBUMIN, URINE: Microalb, Ur: 8.9

## 2020-03-19 ENCOUNTER — Other Ambulatory Visit: Payer: Self-pay

## 2020-03-19 ENCOUNTER — Ambulatory Visit (INDEPENDENT_AMBULATORY_CARE_PROVIDER_SITE_OTHER): Payer: PPO | Admitting: Nurse Practitioner

## 2020-03-19 ENCOUNTER — Encounter: Payer: Self-pay | Admitting: Nurse Practitioner

## 2020-03-19 VITALS — BP 139/82 | HR 83 | Wt 248.0 lb

## 2020-03-19 DIAGNOSIS — E1122 Type 2 diabetes mellitus with diabetic chronic kidney disease: Secondary | ICD-10-CM

## 2020-03-19 DIAGNOSIS — N184 Chronic kidney disease, stage 4 (severe): Secondary | ICD-10-CM | POA: Diagnosis not present

## 2020-03-19 DIAGNOSIS — F17200 Nicotine dependence, unspecified, uncomplicated: Secondary | ICD-10-CM | POA: Diagnosis not present

## 2020-03-19 DIAGNOSIS — E782 Mixed hyperlipidemia: Secondary | ICD-10-CM

## 2020-03-19 DIAGNOSIS — I10 Essential (primary) hypertension: Secondary | ICD-10-CM

## 2020-03-19 DIAGNOSIS — Z794 Long term (current) use of insulin: Secondary | ICD-10-CM | POA: Diagnosis not present

## 2020-03-19 LAB — POCT GLYCOSYLATED HEMOGLOBIN (HGB A1C): Hemoglobin A1C: 7.6 % — AB (ref 4.0–5.6)

## 2020-03-19 MED ORDER — GLIPIZIDE ER 5 MG PO TB24: 5.0000 mg | ORAL_TABLET | Freq: Every day | ORAL | 3 refills | Status: DC

## 2020-03-19 NOTE — Patient Instructions (Signed)

## 2020-03-19 NOTE — Progress Notes (Signed)
03/19/2020            Endocrinology follow-up note   Subjective:    Patient ID: Dylan Tyler, male    DOB: 01-20-1946.  he is being seen in follow-up  for management of currently uncontrolled, complicated type 2 diabetes, hyperlipidemia, hypertension.   PMD:  Glenda Chroman, MD.   Past Medical History:  Diagnosis Date   Ankylosing spondylitis ([place])    Anxiety    Aortic root dilatation (HCC)    AR (aortic regurgitation)    Atrial fibrillation and flutter (HCC)    Bladder cancer (HCC)    Chronic pain    Followed at [facility]   COPD (chronic obstructive pulmonary disease) (HCC)    DDD (degenerative disc disease), lumbosacral    DDD (degenerative disc disease), thoracolumbar    Degenerative cervical disc    Depression    Diabetes mellitus type II    Diverticulitis    Essential hypertension    GERD (gastroesophageal reflux disease)    History of kidney stones    Hyperlipidemia    Migraine    Nonischemic cardiomyopathy ([place])    Cardiolite 12/09 LVEF 55%, minor luminal irregularities at cardiac catheterization   OSA on CPAP    Rheumatoid arthritis (HCC)    Symptomatic bradycardia    Medtronic Advisa L dual-chamber pacemaker  05/2015 - Dr. Curt Bears   Past Surgical History:  Procedure Laterality Date   CARDIAC CATHETERIZATION  08/13/11   CARDIOVERSION N/A 07/02/2015   Procedure: CARDIOVERSION;  Surgeon: Sanda Klein, MD;  Location: [facility];  Service: Cardiovascular;  Laterality: N/A;   CATARACT EXTRACTION W/PHACO Left 02/10/2019   Procedure: CATARACT EXTRACTION PHACO AND INTRAOCULAR LENS PLACEMENT ([place]);  Surgeon: Baruch Goldmann, MD;  Location: [facility];  Service: Ophthalmology;  Laterality: Left;  CDE: 7.10   CATARACT EXTRACTION W/PHACO Right 02/27/2019   Procedure: CATARACT EXTRACTION PHACO AND INTRAOCULAR LENS PLACEMENT RIGHT EYE CDE=11.84;  Surgeon: Baruch Goldmann, MD;  Location: [facility];  Service: Ophthalmology;  Laterality: Right;  right    CYSTOSCOPY WITH HOLMIUM LASER LITHOTRIPSY  1990's   EP IMPLANTABLE DEVICE N/A 05/21/2015   MDT Advisa DR MRI pacemaker implanted by Dr Curt Bears   KNEE ARTHROSCOPY Right ~ [place]  ~ 2008   TRANSURETHRAL RESECTION OF BLADDER TUMOR WITH GYRUS (TURBT-GYRUS)  1990's?   Social History   Socioeconomic History   Marital status: Divorced    Spouse name: Not on file   Number of children: Not on file   Years of education: Not on file   Highest education level: Not on file  Occupational History   Occupation: Disabled    Employer: DISABLED  Tobacco Use   Smoking status: Current Every Day Smoker    Packs/day: 0.30    Years: 50.00    Pack years: 15.00    Types: E-cigarettes    Start date: 02/26/1959    Last attempt to quit: 12/06/2013    Years since quitting: 6.2   Smokeless tobacco: Former Systems developer    Types: Chew   Tobacco comment: 05/21/2015 "quit chewing in 1992"  Vaping Use   Vaping Use: Every day   Substances: Nicotine, Flavoring  Substance and Sexual Activity   Alcohol use: Yes    Alcohol/week: 2.0 standard drinks    Types: 2 Cans of beer per week   Drug use: Yes    Types: Cocaine    Comment: 05/21/2015 "quit in the early 2000"   Sexual activity: Not on file  Other Topics Concern   Not on file  Social History Narrative   Divorced   No regular exercise   Social Determinants of Health   Financial Resource Strain:    Difficulty of Paying Living Expenses: Not on file  Food Insecurity:    Worried About [place] in the Last Year: Not on file   Ran Out of Food in the Last Year: Not on file  Transportation Needs:    Lack of Transportation (Medical): Not on file   Lack of Transportation (Non-Medical): Not on file  Physical Activity:    Days of Exercise per Week: Not on file   Minutes of Exercise per Session: Not on file  Stress:    Feeling of Stress : Not on file  Social Connections:    Frequency of Communication  with Friends and Family: Not on file   Frequency of Social Gatherings with Friends and Family: Not on file   Attends Religious Services: Not on file   Active Member of Clubs or Organizations: Not on file   Attends Archivist Meetings: Not on file   Marital Status: Not on file   Outpatient Encounter Medications as of 03/19/2020  Medication Sig   acetaminophen (TYLENOL) 500 MG tablet Take 500 mg by mouth every 6 (six) hours as needed (pain).    albuterol (PROVENTIL) (2.5 MG/3ML) 0.083% nebulizer solution Take 2.5 mg by nebulization every 6 (six) hours as needed for wheezing.   Alcohol Swabs 70 % PADS Use as directed   ALPRAZolam (XANAX) 0.5 MG tablet Take 0.5 mg by mouth 2 (two) times daily as needed.   carvedilol (COREG) 6.25 MG tablet TAKE ONE TABLET BY MOUTH TWICE DAILY. TAKE WITH A MEAL.   Cholecalciferol (VITAMIN D-3) 1000 UNITS CAPS Take 1,000 Units by mouth 2 (two) times daily.    citalopram (CELEXA) 40 MG tablet Take 20-40 mg by mouth daily.    Continuous Blood Gluc Receiver (FREESTYLE LIBRE 14 DAY READER) DEVI USE AS DIRECTED.   Continuous Blood Gluc Sensor (FREESTYLE LIBRE 14 DAY SENSOR) MISC APPLY ONE SENSOR EVERY 14 DAYS.   ELIQUIS 5 MG TABS tablet TAKE ONE TABLET BY MOUTH TWICE DAILY   ENTRESTO 24-26 MG TAKE ONE TABLET BY MOUTH TWICE DAILY   FREESTYLE LITE test strip USE TO CHECK BLOOD SUGAR FOUR TIMES DAILY.   furosemide (LASIX) 40 MG tablet TAKE ONE TABLET (40MG ) BY MOUTH EVERY *OTHER* DAY ALTERNATING WITH ONE-HALF TABLET (20MG )   gabapentin (NEURONTIN) 400 MG capsule Take 400 mg by mouth 3 (three) times daily.    Insulin Pen Needle (B-D ULTRAFINE III SHORT PEN) 31G X 8 MM MISC Use to inject insulin 4 x daily   Insulin Pen Needle (BD PEN NEEDLE NANO U/F) 32G X 4 MM MISC 1 each by Other route 4 (four) times daily.   ipratropium-albuterol (DUONEB) 0.5-2.5 (3) MG/3ML SOLN Take 3 mLs by nebulization every 4 (four) hours as needed (for shortness of  breath).   Lancets (FREESTYLE) lancets USE TO CHECK BLOOD SUGAR FOUR TIMES DAILY.   LANTUS SOLOSTAR 100 UNIT/ML Solostar Pen INJECT 100 UNITS INTO THE SKIN AT BEDTIME   metoCLOPramide (REGLAN) 5 MG tablet Take 5 mg by mouth every 6 (six) hours as needed for nausea or vomiting.    nitroGLYCERIN (NITROSTAT) 0.4 MG SL tablet Place 1 tablet (0.4 mg total) under the tongue every 5 (five) minutes as needed for chest pain.   NON FORMULARY 1 each by Other route See admin instructions.  CPAP - Uses nightly   NOVOLOG FLEXPEN 100 UNIT/ML FlexPen INJECT 25-31 UNITS THREE TIMES DAILY AS DIRECTED WITH MEALS.   simvastatin (ZOCOR) 40 MG tablet TAKE ONE TABLET BY MOUTH AT BEDTIME   traMADol (ULTRAM) 50 MG tablet Take 1 tablet by mouth daily as needed (pain).    TRELEGY ELLIPTA 100-62.5-25 MCG/INH AEPB Inhale 1 puff into the lungs daily.   triamcinolone cream (KENALOG) 0.1 % Apply 1 application topically 2 (two) times daily as needed.   vitamin B-12 (CYANOCOBALAMIN) 1000 MCG tablet Take 1,000 mcg by mouth 2 (two) times daily.     VOLTAREN 1 % GEL Apply 2 g topically 4 (four) times daily as needed (for pain).    [DISCONTINUED] cholestyramine (QUESTRAN) 4 g packet Take 4 g by mouth 3 (three) times daily with meals.   cholestyramine light (PREVALITE) 4 g packet SMARTSIG:1 Packet(s) By Mouth Twice Daily PRN   glipiZIDE (GLUCOTROL XL) 5 MG 24 hr tablet Take 1 tablet (5 mg total) by mouth daily with breakfast.   No facility-administered encounter medications on file as of 03/19/2020.    ALLERGIES: No Known Allergies  VACCINATION STATUS:  There is no immunization history on file for this patient.  Diabetes He presents for his follow-up diabetic visit. He has type 2 diabetes mellitus. Onset time: He was diagnosed at approximate age of 23 years. His disease course has been stable (His most recent A1c was 9.8% on 01/12/2017.). Hypoglycemia symptoms include hunger, nervousness/anxiousness and tremors.  Pertinent negatives for hypoglycemia include no confusion, pallor or seizures. Associated symptoms include foot paresthesias, polydipsia and polyuria. Pertinent negatives for diabetes include no fatigue, no polyphagia and no weakness. There are no hypoglycemic complications. Symptoms are stable. Diabetic complications include heart disease and peripheral neuropathy. Risk factors for coronary artery disease include dyslipidemia, diabetes mellitus, hypertension, male sex, sedentary lifestyle, tobacco exposure, family history and obesity. Current diabetic treatment includes intensive insulin program and oral agent (monotherapy). He is compliant with treatment most of the time. His weight is fluctuating minimally. He is following a generally unhealthy diet. When asked about meal planning, he reported none. He has not had a previous visit with a dietitian. He never [facility] with cane due to bilateral knee arthritis) participates in exercise. His home blood glucose trend is fluctuating minimally. His breakfast blood glucose range is generally >200 mg/dl. His lunch blood glucose range is generally >200 mg/dl. His dinner blood glucose range is generally 140-180 mg/dl. His bedtime blood glucose range is generally >200 mg/dl. His overall blood glucose range is >200 mg/dl. (He presents today, accompanied by his wife, with his CGM showing still significantly above target fasting and postprandial glycemic profile.  His POCT A1C today is 7.6%, essentially unchanged from last visit of 7.8%.  He does have some mid afternoon, early evening episodes of hypoglycemia related to skipping lunch.) An ACE inhibitor/angiotensin II receptor blocker is being taken. He sees a podiatrist.Eye exam is current.  Hyperlipidemia This is a chronic problem. The current episode started more than 1 year ago. The problem is uncontrolled. Recent lipid tests were reviewed and are high. Exacerbating diseases include chronic renal disease, diabetes and  obesity. Factors aggravating his hyperlipidemia include smoking, fatty foods and beta blockers. Pertinent negatives include no myalgias or shortness of breath. Current antihyperlipidemic treatment includes statins. The current treatment provides mild improvement of lipids. Compliance problems include adherence to diet and adherence to exercise.  Risk factors for coronary artery disease include dyslipidemia, diabetes mellitus, hypertension, a sedentary lifestyle,  male sex and obesity.    Review of systems  Constitutional: + Minimally fluctuating body weight,  current  Body mass index is 36.62 kg/m. , + fatigue, no subjective hyperthermia, no subjective hypothermia Eyes: no blurry vision, no xerophthalmia ENT: no sore throat, no nodules palpated in throat, no dysphagia/odynophagia, no hoarseness Cardiovascular: no Chest Pain, no Shortness of Breath, no palpitations, no leg swelling Respiratory: no cough, no shortness of breath Gastrointestinal: no Nausea/Vomiting/Diarrhea Musculoskeletal: no muscle/joint aches Skin: no rashes, no hyperemia Neurological: no tremors, no numbness, no tingling, no dizziness Psychiatric: no depression, no anxiety   Objective:    BP 139/82 (BP Location: Left Arm, Patient Position: Sitting)    Pulse 83    Wt 248 lb (112.5 kg)    BMI 36.62 kg/m   Wt Readings from Last 3 Encounters:  03/19/20 248 lb (112.5 kg)  12/08/19 250 lb 6.4 oz (113.6 kg)  11/13/19 253 lb (114.8 kg)    BP Readings from Last 3 Encounters:  03/19/20 139/82  12/08/19 128/64  11/13/19 119/71    Physical Exam- Limited  Constitutional:  Body mass index is 36.62 kg/m. , not in acute distress, normal state of mind Eyes:  EOMI, no exophthalmos Neck: Supple Thyroid: No gross goiter Cardiovascular: RRR, no murmers, rubs, or gallops, no edema Respiratory: Adequate breathing efforts, no crackles, rales, rhonchi, or wheezing Musculoskeletal: no gross deformities, strength intact in all four  extremities, + walks with cane due to bilateral knee arthritis Skin:  no rashes, no hyperemia Neurological: no tremor with outstretched hands   Lipid Panel     Component Value Date/Time   CHOL 125 03/15/2020 0000   TRIG 229 (A) 03/15/2020 0000   HDL 30 (A) 03/15/2020 0000   LDLCALC 49 03/15/2020 0000   Recent Results (from the past 2160 hour(s))  CUP PACEART REMOTE DEVICE CHECK     Status: None   Collection Time: 02/27/20  8:05 PM  Result Value Ref Range   Date Time Interrogation Session [id number]    Pulse Generator Manufacturer MERM    Pulse Gen Model [id number] Advisa DR MRI    Pulse Gen Serial Number [id number]    Clinic Name [facility]    Implantable Pulse Generator Type Implantable Pulse Generator    Implantable Pulse Generator Implant Date [id number]    Implantable Lead Manufacturer MERM    Implantable Lead Model 5076 CapSureFix Novus    Implantable Lead Serial Number [id number]    Implantable Lead Implant Date [id number]    Implantable Lead Location Detail 1 UNKNOWN    Implantable Lead Location [id number]    Implantable Lead Manufacturer MERM    Implantable Lead Model 5076 CapSureFix Novus    Implantable Lead Serial Number [id number]    Implantable Lead Implant Date [id number]    Implantable Lead Location Detail 1 UNKNOWN    Implantable Lead Location [id number]    Lead Channel Setting Sensing Sensitivity 2.8 mV   Lead Channel Setting Pacing Pulse Width 0.4 ms   Lead Channel Setting Pacing Amplitude 2.5 V   Lead Channel Impedance Value 494 ohm   Lead Channel Impedance Value 361 ohm   Lead Channel Sensing Intrinsic Amplitude 2 mV   Lead Channel Sensing Intrinsic Amplitude 2 mV   Lead Channel Pacing Threshold Amplitude 0.5 V   Lead Channel Pacing Threshold Pulse Width 0.4 ms   Lead Channel Impedance Value 456 ohm   Lead Channel Impedance Value 361 ohm   Lead Channel Sensing Intrinsic Amplitude 23.875 mV  Lead Channel Sensing Intrinsic Amplitude 23.875 mV   Lead Channel  Pacing Threshold Amplitude 0.875 V   Lead Channel Pacing Threshold Pulse Width 0.4 ms   Battery Status OK    Battery Remaining Longevity 30 mo   Battery Voltage 2.96 V   Brady Statistic RA Percent Paced 0 %   Brady Statistic RV Percent Paced 95.55 %  Microalbumin, urine     Status: None   Collection Time: 03/15/20 12:00 AM  Result Value Ref Range   Microalb, Ur 8.9   Basic metabolic panel     Status: Abnormal   Collection Time: 03/15/20 12:00 AM  Result Value Ref Range   BUN 22 (A) 4 - 21   Creatinine 2.2 (A) 0.6 - 1.3   Potassium 5.0 3.4 - 5.3  Comprehensive metabolic panel     Status: None   Collection Time: 03/15/20 12:00 AM  Result Value Ref Range   GFR calc Af Amer 34    GFR calc non Af Amer 29    Calcium 9.3 8.7 - 10.7  Lipid panel     Status: Abnormal   Collection Time: 03/15/20 12:00 AM  Result Value Ref Range   Triglycerides 229 (A) 40 - 160   Cholesterol 125 0 - 200   HDL 30 (A) 35 - 70   LDL Cholesterol 49   TSH     Status: None   Collection Time: 03/15/20 12:00 AM  Result Value Ref Range   TSH 2.65 0.41 - 5.90    Comment: FREE T4- 0.82  HgB A1c     Status: Abnormal   Collection Time: 03/19/20  3:43 PM  Result Value Ref Range   Hemoglobin A1C 7.6 (A) 4.0 - 5.6 %   HbA1c POC (<> result, manual entry)     HbA1c, POC (prediabetic range)     HbA1c, POC (controlled diabetic range)     CMP Latest Ref Rng & Units 03/15/2020 06/08/2019 02/08/2019  Glucose 70 - 99 mg/dL - - 138(H)  BUN 4 - 21 22(A) 20 21  Creatinine 0.6 - 1.3 2.2(A) 1.6(A) 1.63(H)  Sodium 137 - 147 - 138 139  Potassium 3.4 - 5.3 5.0 5.2 4.1  Chloride 99 - 108 - 102 100  CO2 13 - 22 - 25(A) 26  Calcium 8.7 - 10.7 9.3 9.4 8.5(L)  Total Protein 6.5 - 8.1 g/dL - - 6.4(L)  Total Bilirubin 0.3 - 1.2 mg/dL - - 1.0  Alkaline Phos 25 - 125 - 81 54  AST 14 - 40 - 21 19  ALT 10 - 40 - 38 17     Assessment & Plan:   1. Type 2 diabetes mellitus with stage 3 chronic kidney disease, without long-term  current use of insulin ([place])  - Patient has currently uncontrolled symptomatic type 2 DM since  74 years of age.  He presents today, accompanied by his wife, with his CGM showing still significantly above target fasting and postprandial glycemic profile.  His POCT A1C today is 7.6%, essentially unchanged from last visit of 7.8%.  He does have some mid afternoon, early evening episodes of hypoglycemia related to skipping lunch.  He has also had multiple steroid injections to bilateral knees over the last year.  He reports he is trying to get both knees replaced, but the surgeon will not perform surgery until he loses some weight.  -his diabetes is complicated by stage 4 renal insufficiency, obesity/sedentary life, neuropathy, cardiomyopathy, chronic heavy smoking, inadequate insurance and Subhan  JAMAAL BERNASCONI remains at a high risk for more acute and chronic complications which include CAD, CVA, CKD, retinopathy, and neuropathy. These are all discussed in detail with the patient.  - I have counseled him on diet management and weight loss, by adopting a carbohydrate restricted/protein rich diet.  - The patient admits there is a room for improvement in their diet and drink choices. -  Suggestion is made for the patient to avoid simple carbohydrates from their diet including Cakes, Sweet Desserts / Pastries, Ice Cream, Soda (diet and regular), Sweet Tea, Candies, Chips, Cookies, Sweet Pastries,  Store Bought Juices, Alcohol in Excess of  1-2 drinks a day, Artificial Sweeteners, Coffee Creamer, and "Sugar-free" Products. This will help patient to have stable blood glucose profile and potentially avoid unintended weight gain.   - I encouraged the patient to switch to  unprocessed or minimally processed complex starch and increased protein intake (animal or plant source), fruits, and vegetables.   - Patient is advised to stick to a routine mealtimes to eat 3 meals  a day and avoid unnecessary snacks ( to snack  only to correct hypoglycemia).  - I have approached him with the following individualized plan to manage diabetes and patient agrees:   - Based on his glucose profile, he will continue to need intensive treatment with higher dose of basal/bolus insulin in order for him to achieve control of diabetes to target.    -He is approaching the need to switch U500 due to high volumes of insulin required daily.  Discussed this with him and his wife. -For now, he is advised to continue Lantus 100 units SQ daily at bedtime and continue Novolog 25-31 units TID with meals if blood glucose above 90 and he is eating.  -I discussed and initiated Glipizide 5 mg XL daily with breakfast. -He is encouraged to continue using his CGM to monitor blood glucose 4 times per day, before meals and at bedtime and report to the clinic if blood glucose levels are less than 70 or greater than 200 for 3 tests in a row.  - He is not a candidate for metformin therapy due to CKD.  - He is not a candidate for incretin therapy (such as Ozempic) due to hypertriglyceridemia and heavy smoking history.  2) Lipids/HPL:  His recent lipid panel from 03/15/20 shows controlled LDL of 49 and triglycerides of 229.  He is advised to continue Cholestyramine 4 g TID with meals and Simvastatin 40 mg po daily at bedtime.  Side effects and precautions discussed with him.  3) Hypertension His blood pressure is controlled to target.  He is advised to continue Coreg 6.25 mg po twice daily, Lasix 40 mg po every other day (alternating with 20 mg), and Entresto 24-26 mg po twice daily.  4) Chronic Care/Health Maintenance: -he  is on ACEI/ARB and Statin medications and  is encouraged to continue to follow up with Ophthalmology, Dentist,  Podiatrist at least yearly or according to recommendations. - I have recommended yearly flu vaccine and pneumonia vaccination at least every 5 years; and  sleep for at least 7 hours a day.  The patient was counseled on  the dangers of tobacco use, and was advised to quit.  Reviewed strategies to maximize success, including removing cigarettes and smoking materials from environment.  5) Weight management:  His Body mass index is 36.62 kg/m. He is a candidate for modest weight loss.  Exercise regimen and carbs restrictions were detailed with him.   -  I advised patient to maintain close follow up with Glenda Chroman, MD for primary care needs.  - Time spent on this patient care encounter:  35 min, of which > 50% was spent in  counseling and the rest reviewing his blood glucose logs , discussing his hypoglycemia and hyperglycemia episodes, reviewing his current and  previous labs / studies  ( including abstraction from other facilities) and medications  doses and developing a  long term treatment plan and documenting his care.   Please refer to Patient Instructions for Blood Glucose Monitoring and Insulin/Medications Dosing Guide"  in media tab for additional information. Please  also refer to " Patient Self Inventory" in the Media  tab for reviewed elements of pertinent patient history.  [name] participated in the discussions, expressed understanding, and voiced agreement with the above plans.  All questions were answered to his satisfaction. he is encouraged to contact clinic should he have any questions or concerns prior to his return visit.   Follow up plan: - Return in about 4 months (around 07/19/2020) for Diabetes follow up, Previsit labs, Virtual visit ok.  Rayetta Pigg, FNP-BC [facility] Phone: [phone] Fax: [phone]  03/19/2020, 4:43 PM This note was partially dictated with voice recognition software. Similar sounding words can be transcribed inadequately or may not  be corrected upon review.

## 2020-03-29 ENCOUNTER — Other Ambulatory Visit: Payer: Self-pay | Admitting: Cardiology

## 2020-04-01 DIAGNOSIS — M1711 Unilateral primary osteoarthritis, right knee: Secondary | ICD-10-CM | POA: Diagnosis not present

## 2020-04-01 DIAGNOSIS — G8929 Other chronic pain: Secondary | ICD-10-CM | POA: Diagnosis not present

## 2020-04-01 DIAGNOSIS — M19012 Primary osteoarthritis, left shoulder: Secondary | ICD-10-CM | POA: Diagnosis not present

## 2020-04-01 DIAGNOSIS — Z950 Presence of cardiac pacemaker: Secondary | ICD-10-CM | POA: Diagnosis not present

## 2020-04-01 DIAGNOSIS — M25562 Pain in left knee: Secondary | ICD-10-CM | POA: Diagnosis not present

## 2020-04-04 DIAGNOSIS — E1165 Type 2 diabetes mellitus with hyperglycemia: Secondary | ICD-10-CM | POA: Diagnosis not present

## 2020-04-05 DIAGNOSIS — Z6837 Body mass index (BMI) 37.0-37.9, adult: Secondary | ICD-10-CM | POA: Diagnosis not present

## 2020-04-05 DIAGNOSIS — I10 Essential (primary) hypertension: Secondary | ICD-10-CM | POA: Diagnosis not present

## 2020-04-05 DIAGNOSIS — E1165 Type 2 diabetes mellitus with hyperglycemia: Secondary | ICD-10-CM | POA: Diagnosis not present

## 2020-04-05 DIAGNOSIS — Z9889 Other specified postprocedural states: Secondary | ICD-10-CM | POA: Diagnosis not present

## 2020-04-05 DIAGNOSIS — Z950 Presence of cardiac pacemaker: Secondary | ICD-10-CM | POA: Diagnosis not present

## 2020-04-05 DIAGNOSIS — M19012 Primary osteoarthritis, left shoulder: Secondary | ICD-10-CM | POA: Diagnosis not present

## 2020-04-05 DIAGNOSIS — M75122 Complete rotator cuff tear or rupture of left shoulder, not specified as traumatic: Secondary | ICD-10-CM | POA: Diagnosis not present

## 2020-04-05 DIAGNOSIS — F419 Anxiety disorder, unspecified: Secondary | ICD-10-CM | POA: Diagnosis not present

## 2020-04-05 DIAGNOSIS — F17210 Nicotine dependence, cigarettes, uncomplicated: Secondary | ICD-10-CM | POA: Diagnosis not present

## 2020-04-05 DIAGNOSIS — Z299 Encounter for prophylactic measures, unspecified: Secondary | ICD-10-CM | POA: Diagnosis not present

## 2020-04-05 DIAGNOSIS — M25512 Pain in left shoulder: Secondary | ICD-10-CM | POA: Diagnosis not present

## 2020-04-05 DIAGNOSIS — E1142 Type 2 diabetes mellitus with diabetic polyneuropathy: Secondary | ICD-10-CM | POA: Diagnosis not present

## 2020-04-05 DIAGNOSIS — M459 Ankylosing spondylitis of unspecified sites in spine: Secondary | ICD-10-CM | POA: Diagnosis not present

## 2020-04-15 ENCOUNTER — Other Ambulatory Visit: Payer: Self-pay | Admitting: "Endocrinology

## 2020-04-26 DIAGNOSIS — S46012D Strain of muscle(s) and tendon(s) of the rotator cuff of left shoulder, subsequent encounter: Secondary | ICD-10-CM | POA: Diagnosis not present

## 2020-05-01 ENCOUNTER — Other Ambulatory Visit: Payer: Self-pay | Admitting: "Endocrinology

## 2020-05-01 ENCOUNTER — Other Ambulatory Visit: Payer: Self-pay | Admitting: Cardiology

## 2020-06-04 DIAGNOSIS — J069 Acute upper respiratory infection, unspecified: Secondary | ICD-10-CM | POA: Diagnosis not present

## 2020-06-04 DIAGNOSIS — I739 Peripheral vascular disease, unspecified: Secondary | ICD-10-CM | POA: Diagnosis not present

## 2020-06-04 DIAGNOSIS — M459 Ankylosing spondylitis of unspecified sites in spine: Secondary | ICD-10-CM | POA: Diagnosis not present

## 2020-06-04 DIAGNOSIS — J449 Chronic obstructive pulmonary disease, unspecified: Secondary | ICD-10-CM | POA: Diagnosis not present

## 2020-06-04 DIAGNOSIS — F17210 Nicotine dependence, cigarettes, uncomplicated: Secondary | ICD-10-CM | POA: Diagnosis not present

## 2020-06-04 DIAGNOSIS — Z299 Encounter for prophylactic measures, unspecified: Secondary | ICD-10-CM | POA: Diagnosis not present

## 2020-06-05 ENCOUNTER — Ambulatory Visit (INDEPENDENT_AMBULATORY_CARE_PROVIDER_SITE_OTHER): Payer: PPO

## 2020-06-05 DIAGNOSIS — R001 Bradycardia, unspecified: Secondary | ICD-10-CM | POA: Diagnosis not present

## 2020-06-07 ENCOUNTER — Other Ambulatory Visit: Payer: Self-pay | Admitting: Cardiology

## 2020-06-12 NOTE — Progress Notes (Deleted)
Cardiology Office Note  Date: 06/12/2020   ID: Tamir, Wallman [birth year], MRN [id number]  PCP:  Glenda Chroman, MD  Cardiologist:  Rozann Lesches, MD Electrophysiologist:  Thompson Grayer, MD   No chief complaint on file.   History of Present Illness: Dylan Tyler is a 74 y.o. male last seen in June.  He follows with Dr. [name], Medtronic pacemaker in place.  Past Medical History:  Diagnosis Date  . Ankylosing spondylitis ([place])   . Anxiety   . Aortic root dilatation ([place])   . AR (aortic regurgitation)   . Atrial fibrillation and flutter ([place])   . Bladder cancer ([place])   . Chronic pain    Followed at [facility]  . COPD (chronic obstructive pulmonary disease) ([place])   . DDD (degenerative disc disease), lumbosacral   . DDD (degenerative disc disease), thoracolumbar   . Degenerative cervical disc   . Depression   . Diabetes mellitus type II   . Diverticulitis   . Essential hypertension   . GERD (gastroesophageal reflux disease)   . History of kidney stones   . Hyperlipidemia   . Migraine   . Nonischemic cardiomyopathy ([place])    Cardiolite 12/09 LVEF 55%, minor luminal irregularities at cardiac catheterization  . OSA on CPAP   . Rheumatoid arthritis ([place])   . Symptomatic bradycardia    Medtronic Advisa L dual-chamber pacemaker  05/2015 - Dr. Curt Bears    Past Surgical History:  Procedure Laterality Date  . CARDIAC CATHETERIZATION  08/13/11  . CARDIOVERSION N/A 07/02/2015   Procedure: CARDIOVERSION;  Surgeon: Sanda Klein, MD;  Location: [facility];  Service: Cardiovascular;  Laterality: N/A;  . CATARACT EXTRACTION W/PHACO Left 02/10/2019   Procedure: CATARACT EXTRACTION PHACO AND INTRAOCULAR LENS PLACEMENT ([place]);  Surgeon: Baruch Goldmann, MD;  Location: [facility];  Service: Ophthalmology;  Laterality: Left;  CDE: 7.10  . CATARACT EXTRACTION W/PHACO Right 02/27/2019   Procedure: CATARACT EXTRACTION PHACO AND INTRAOCULAR LENS PLACEMENT RIGHT EYE CDE=11.84;  Surgeon: Baruch Goldmann, MD;  Location: [facility];  Service: Ophthalmology;  Laterality: Right;  right  . CYSTOSCOPY WITH HOLMIUM LASER LITHOTRIPSY  1990's  . EP IMPLANTABLE DEVICE N/A 05/21/2015   MDT Advisa DR MRI pacemaker implanted by Dr Curt Bears  . KNEE ARTHROSCOPY Right ~ 1991  . LAPAROSCOPIC CHOLECYSTECTOMY  ~ 2008  . TRANSURETHRAL RESECTION OF BLADDER TUMOR WITH GYRUS (TURBT-GYRUS)  1990's?    Current Outpatient Medications  Medication Sig Dispense Refill  . acetaminophen (TYLENOL) 500 MG tablet Take 500 mg by mouth every 6 (six) hours as needed (pain).     Marland Kitchen albuterol (PROVENTIL) (2.5 MG/3ML) 0.083% nebulizer solution Take 2.5 mg by nebulization every 6 (six) hours as needed for wheezing.    . Alcohol Swabs 70 % PADS Use as directed 120 each 2  . ALPRAZolam (XANAX) 0.5 MG tablet Take 0.5 mg by mouth 2 (two) times daily as needed.  1  . BD PEN NEEDLE NANO U/F 32G X 4 MM MISC USE AS DIRECTED FOUR TIMES DAILY. 200 each 5  . carvedilol (COREG) 6.25 MG tablet TAKE ONE TABLET BY MOUTH TWICE DAILY. TAKE WITH A MEAL. 60 tablet 6  . Cholecalciferol (VITAMIN D-3) 1000 UNITS CAPS Take 1,000 Units by mouth 2 (two) times daily.     . cholestyramine light (PREVALITE) 4 g packet SMARTSIG:1 Packet(s) By Mouth Twice Daily PRN    . citalopram (CELEXA) 40 MG tablet Take 20-40 mg by mouth daily.     . Continuous  Blood Gluc Receiver (FREESTYLE LIBRE 14 DAY READER) DEVI USE AS DIRECTED. 1 each 0  . Continuous Blood Gluc Sensor (FREESTYLE LIBRE 14 DAY SENSOR) MISC APPLY ONE SENSOR EVERY 14 DAYS. 2 each 2  . ELIQUIS 5 MG TABS tablet TAKE ONE TABLET BY MOUTH TWICE DAILY 60 tablet 6  . ENTRESTO 24-26 MG TAKE ONE TABLET BY MOUTH TWICE DAILY 60 tablet 6  . FREESTYLE LITE test strip USE TO CHECK BLOOD SUGAR FOUR TIMES DAILY. 100 each 5  . furosemide (LASIX) 40 MG tablet TAKE ONE TABLET (40MG ) BY MOUTH EVERY *OTHER* DAY ALTERNATING WITH ONE-HALF TABLET (20MG ) 45 tablet 2  . gabapentin (NEURONTIN) 400 MG capsule Take 400 mg by mouth  3 (three) times daily.     Marland Kitchen glipiZIDE (GLUCOTROL XL) 5 MG 24 hr tablet Take 1 tablet (5 mg total) by mouth daily with breakfast. 30 tablet 3  . Insulin Pen Needle (B-D ULTRAFINE III SHORT PEN) 31G X 8 MM MISC Use to inject insulin 4 x daily 200 each 3  . ipratropium-albuterol (DUONEB) 0.5-2.5 (3) MG/3ML SOLN Take 3 mLs by nebulization every 4 (four) hours as needed (for shortness of breath).    . Lancets (FREESTYLE) lancets USE TO CHECK BLOOD SUGAR FOUR TIMES DAILY. 200 each 5  . LANTUS SOLOSTAR 100 UNIT/ML Solostar Pen INJECT 100 UNITS INTO THE SKIN AT BEDTIME 90 mL 0  . metoCLOPramide (REGLAN) 5 MG tablet Take 5 mg by mouth every 6 (six) hours as needed for nausea or vomiting.     . nitroGLYCERIN (NITROSTAT) 0.4 MG SL tablet Place 1 tablet (0.4 mg total) under the tongue every 5 (five) minutes as needed for chest pain. 25 tablet 3  . NON FORMULARY 1 each by Other route See admin instructions. CPAP - Uses nightly    . NOVOLOG FLEXPEN 100 UNIT/ML FlexPen INJECT 25-31 UNITS THREE TIMES DAILY AS DIRECTED WITH MEALS. 90 mL 0  . simvastatin (ZOCOR) 40 MG tablet TAKE ONE TABLET BY MOUTH AT BEDTIME 90 tablet 3  . traMADol (ULTRAM) 50 MG tablet Take 1 tablet by mouth daily as needed (pain).     . TRELEGY ELLIPTA 100-62.5-25 MCG/INH AEPB Inhale 1 puff into the lungs daily.    Marland Kitchen triamcinolone cream (KENALOG) 0.1 % Apply 1 application topically 2 (two) times daily as needed.    . vitamin B-12 (CYANOCOBALAMIN) 1000 MCG tablet Take 1,000 mcg by mouth 2 (two) times daily.      . VOLTAREN 1 % GEL Apply 2 g topically 4 (four) times daily as needed (for pain).      No current facility-administered medications for this visit.   Allergies:  Patient has no known allergies.   Social History: The patient  reports that he has been smoking e-cigarettes. He started smoking about 61 years ago. He has a 15.00 pack-year smoking history. He has quit using smokeless tobacco.  His smokeless tobacco use included chew. He  reports current alcohol use of about 2.0 standard drinks of alcohol per week. He reports current drug use. Drug: Cocaine.   Family History: The patient's family history includes Cancer in his father; Heart attack in his mother.   ROS:  Please see the history of present illness. Otherwise, complete review of systems is positive for {NONE DEFAULTED:18576::"none"}.  All other systems are reviewed and negative.   Physical Exam: VS:  There were no vitals taken for this visit., BMI There is no height or weight on file to calculate BMI.  Wt Readings  from Last 3 Encounters:  03/19/20 248 lb (112.5 kg)  12/08/19 250 lb 6.4 oz (113.6 kg)  11/13/19 253 lb (114.8 kg)    General: Patient appears comfortable at rest. HEENT: Conjunctiva and lids normal, oropharynx clear with moist mucosa. Neck: Supple, no elevated JVP or carotid bruits, no thyromegaly. Lungs: Clear to auscultation, nonlabored breathing at rest. Cardiac: Regular rate and rhythm, no S3 or significant systolic murmur, no pericardial rub. Abdomen: Soft, nontender, no hepatomegaly, bowel sounds present, no guarding or rebound. Extremities: No pitting edema, distal pulses 2+. Skin: Warm and dry. Musculoskeletal: No kyphosis. Neuropsychiatric: Alert and oriented x3, affect grossly appropriate.  ECG:  An ECG dated 12/08/2019 was personally reviewed today and demonstrated:  Ventricular paced rhythm with underlying atrial fibrillation/flutter.  Recent Labwork: 03/15/2020: BUN 22; Creatinine 2.2; Potassium 5.0; TSH 2.65     Component Value Date/Time   CHOL 125 03/15/2020 0000   TRIG 229 (A) 03/15/2020 0000   HDL 30 (A) 03/15/2020 0000   LDLCALC 49 03/15/2020 0000    Other Studies Reviewed Today:  Echocardiogram 12/06/2019: 1. Left ventricular ejection fraction, by estimation, is 40 to 45%. The  left ventricle has mildly decreased function. The left ventricle  demonstrates global hypokinesis. There is mild left ventricular  hypertrophy.  Left ventricular diastolic parameters  are indeterminate.  2. Right ventricular systolic function is low normal. The right  ventricular size is normal. Mildly increased right ventricular wall  thickness. There is normal pulmonary artery systolic pressure. The  estimated right ventricular systolic pressure is 41.6  mmHg.  3. Left atrial size was upper normal.  4. The mitral valve is grossly normal. Trivial mitral valve  regurgitation.  5. The aortic valve is tricuspid. Aortic valve regurgitation is mild.  Mild aortic valve sclerosis is present, with no evidence of aortic valve  stenosis.  6. The inferior vena cava is normal in size with greater than 50%  respiratory variability, suggesting right atrial pressure of 3 mmHg.   Assessment and Plan:    Medication Adjustments/Labs and Tests Ordered: Current medicines are reviewed at length with the patient today.  Concerns regarding medicines are outlined above.   Tests Ordered: No orders of the defined types were placed in this encounter.   Medication Changes: No orders of the defined types were placed in this encounter.   Disposition:  Follow up {follow up:15908}  Signed, [name], MD, [facility] 06/12/2020 1:50 PM    [facility] at [place], [place], [place] [address] Phone: [phone]; Fax: [phone]

## 2020-06-13 ENCOUNTER — Ambulatory Visit: Payer: PPO | Admitting: Cardiology

## 2020-06-15 LAB — CUP PACEART REMOTE DEVICE CHECK
Battery Remaining Longevity: 27 mo
Battery Voltage: 2.94 V
Brady Statistic RA Percent Paced: 0 %
Brady Statistic RV Percent Paced: 95.96 %
Date Time Interrogation Session: 20211130121705
Implantable Lead Implant Date: 20161115
Implantable Lead Implant Date: 20161115
Implantable Lead Location: 753859
Implantable Lead Location: 753860
Implantable Lead Model: 5076
Implantable Lead Model: 5076
Implantable Pulse Generator Implant Date: 20161115
Lead Channel Impedance Value: 342 Ohm
Lead Channel Impedance Value: 361 Ohm
Lead Channel Impedance Value: 437 Ohm
Lead Channel Impedance Value: 513 Ohm
Lead Channel Pacing Threshold Amplitude: 0.5 V
Lead Channel Pacing Threshold Amplitude: 0.875 V
Lead Channel Pacing Threshold Pulse Width: 0.4 ms
Lead Channel Pacing Threshold Pulse Width: 0.4 ms
Lead Channel Sensing Intrinsic Amplitude: 1.375 mV
Lead Channel Sensing Intrinsic Amplitude: 1.375 mV
Lead Channel Sensing Intrinsic Amplitude: 23.125 mV
Lead Channel Sensing Intrinsic Amplitude: 23.125 mV
Lead Channel Setting Pacing Amplitude: 2.5 V
Lead Channel Setting Pacing Pulse Width: 0.4 ms
Lead Channel Setting Sensing Sensitivity: 2.8 mV

## 2020-06-17 DIAGNOSIS — M25562 Pain in left knee: Secondary | ICD-10-CM | POA: Diagnosis not present

## 2020-06-17 DIAGNOSIS — G8929 Other chronic pain: Secondary | ICD-10-CM | POA: Diagnosis not present

## 2020-06-17 DIAGNOSIS — M25561 Pain in right knee: Secondary | ICD-10-CM | POA: Diagnosis not present

## 2020-06-17 NOTE — Progress Notes (Signed)
Remote pacemaker transmission.   

## 2020-06-25 DIAGNOSIS — Z1331 Encounter for screening for depression: Secondary | ICD-10-CM | POA: Diagnosis not present

## 2020-06-25 DIAGNOSIS — F17210 Nicotine dependence, cigarettes, uncomplicated: Secondary | ICD-10-CM | POA: Diagnosis not present

## 2020-06-25 DIAGNOSIS — Z1339 Encounter for screening examination for other mental health and behavioral disorders: Secondary | ICD-10-CM | POA: Diagnosis not present

## 2020-06-25 DIAGNOSIS — E1165 Type 2 diabetes mellitus with hyperglycemia: Secondary | ICD-10-CM | POA: Diagnosis not present

## 2020-06-25 DIAGNOSIS — R5383 Other fatigue: Secondary | ICD-10-CM | POA: Diagnosis not present

## 2020-06-25 DIAGNOSIS — I10 Essential (primary) hypertension: Secondary | ICD-10-CM | POA: Diagnosis not present

## 2020-06-25 DIAGNOSIS — Z7189 Other specified counseling: Secondary | ICD-10-CM | POA: Diagnosis not present

## 2020-06-25 DIAGNOSIS — Z2821 Immunization not carried out because of patient refusal: Secondary | ICD-10-CM | POA: Diagnosis not present

## 2020-06-25 DIAGNOSIS — Z125 Encounter for screening for malignant neoplasm of prostate: Secondary | ICD-10-CM | POA: Diagnosis not present

## 2020-06-25 DIAGNOSIS — Z299 Encounter for prophylactic measures, unspecified: Secondary | ICD-10-CM | POA: Diagnosis not present

## 2020-06-25 DIAGNOSIS — Z79899 Other long term (current) drug therapy: Secondary | ICD-10-CM | POA: Diagnosis not present

## 2020-06-25 DIAGNOSIS — Z0000 Encounter for general adult medical examination without abnormal findings: Secondary | ICD-10-CM | POA: Diagnosis not present

## 2020-06-25 DIAGNOSIS — E7800 Pure hypercholesterolemia, unspecified: Secondary | ICD-10-CM | POA: Diagnosis not present

## 2020-06-25 DIAGNOSIS — Z6838 Body mass index (BMI) 38.0-38.9, adult: Secondary | ICD-10-CM | POA: Diagnosis not present

## 2020-06-25 LAB — LIPID PANEL
Cholesterol: 157 (ref 0–200)
HDL: 44 (ref 35–70)
LDL Cholesterol: 86
Triglycerides: 155 (ref 40–160)

## 2020-06-25 LAB — BASIC METABOLIC PANEL
BUN: 25 — AB (ref 4–21)
Creatinine: 1.6 — AB (ref 0.6–1.3)

## 2020-06-25 LAB — TSH: TSH: 1.89 (ref 0.41–5.90)

## 2020-06-25 LAB — HEMOGLOBIN A1C: Hemoglobin A1C: 7.1

## 2020-06-25 LAB — COMPREHENSIVE METABOLIC PANEL
Calcium: 9.2 (ref 8.7–10.7)
GFR calc Af Amer: 49
GFR calc non Af Amer: 42

## 2020-06-25 NOTE — Progress Notes (Signed)
Cardiology Office Note  Date: 06/26/2020   ID: Zayin, Valadez 1946/02/04, MRN [id number]  PCP:  Glenda Chroman, MD  Cardiologist:  Rozann Lesches, MD Electrophysiologist:  Thompson Grayer, MD   Chief Complaint: Follow-up permanent atrial fibrillation, secondary cardiomyopathy, cardiac pacemaker, stage III chronic kidney disease.  History of Present Illness: Dylan Tyler is a 74 y.o. male with a history of permanent atrial fibrillation, secondary cardiomyopathy, cardiac pacemaker, stage III chronic kidney disease, aortic regurgitation, COPD, DM 2, HTN, GERD, OSA on CPAP, current smoker  Last encounter with Dr. Domenic Polite on 12/08/2019 for routine visit.  Did not report any palpitations, chest pain.  Having some issues with chronic back and knee pain.  Was seeing Dr. [name] for device management for his pacemaker.  Follow-up echo showed improvement in EF 40 to 45% with mild AR.  Plan was to continue Coreg, Entresto, Aldactone, Lasix and check a follow-up basic metabolic panel.  Patient was asymptomatic with his atrial fibrillation/flutter.  He was continuing on Eliquis for stroke prophylaxis.  Follow-up CBC was ordered.  Last creatinine was 1.6.  He is here for 36-month follow-up today.  States he recently had an upper respiratory infection/virus but it was not Covid.  Still has some lingering coughing.  He continues to smoke.  He denies any anginal symptoms but does have some chronic dyspnea more than likely due to long history of smoking and continues to smoke.  Blood pressure is soft today 100/50.  He denies any CVA or TIA-like symptoms.  States he has occasional transient dizziness but no presyncopal or syncopal episodes.  Denies any PND, orthopnea.  States she has not been using his CPAP.  Denies any claudication-like symptoms.  However he does have chronic arthritis pain in both knees and uses a cane to help assist with ambulation.  Denies any DVT or PE-like symptoms, or lower extremity  edema.  States his blood sugar has not been well controlled recently.  States his recent hemoglobin A1c was around 7.1% down from 7.6%.  Recent device check by Dr. [name]: Normal device function. Battery status, leads stable. Histograms reviewed and appropriate.   Past Medical History:  Diagnosis Date  . Ankylosing spondylitis ([place])   . Anxiety   . Aortic root dilatation ([place])   . AR (aortic regurgitation)   . Atrial fibrillation and flutter ([place])   . Bladder cancer ([place])   . Chronic pain    Followed at [facility]  . COPD (chronic obstructive pulmonary disease) ([place])   . DDD (degenerative disc disease), lumbosacral   . DDD (degenerative disc disease), thoracolumbar   . Degenerative cervical disc   . Depression   . Diabetes mellitus type II   . Diverticulitis   . Essential hypertension   . GERD (gastroesophageal reflux disease)   . History of kidney stones   . Hyperlipidemia   . Migraine   . Nonischemic cardiomyopathy ([place])    Cardiolite 12/09 LVEF 55%, minor luminal irregularities at cardiac catheterization  . OSA on CPAP   . Rheumatoid arthritis ([place])   . Symptomatic bradycardia    Medtronic Advisa L dual-chamber pacemaker  05/2015 - Dr. Curt Bears    Past Surgical History:  Procedure Laterality Date  . CARDIAC CATHETERIZATION  08/13/11  . CARDIOVERSION N/A 07/02/2015   Procedure: CARDIOVERSION;  Surgeon: Sanda Klein, MD;  Location: [facility];  Service: Cardiovascular;  Laterality: N/A;  . CATARACT EXTRACTION W/PHACO Left 02/10/2019   Procedure: CATARACT EXTRACTION PHACO AND INTRAOCULAR LENS PLACEMENT (  [place]);  Surgeon: Baruch Goldmann, MD;  Location: [facility];  Service: Ophthalmology;  Laterality: Left;  CDE: 7.10  . CATARACT EXTRACTION W/PHACO Right 02/27/2019   Procedure: CATARACT EXTRACTION PHACO AND INTRAOCULAR LENS PLACEMENT RIGHT EYE CDE=11.84;  Surgeon: Baruch Goldmann, MD;  Location: [facility];  Service: Ophthalmology;  Laterality: Right;  right  . CYSTOSCOPY WITH HOLMIUM LASER  LITHOTRIPSY  1990's  . EP IMPLANTABLE DEVICE N/A 05/21/2015   MDT Advisa DR MRI pacemaker implanted by Dr Curt Bears  . KNEE ARTHROSCOPY Right ~ 1991  . LAPAROSCOPIC CHOLECYSTECTOMY  ~ 2008  . TRANSURETHRAL RESECTION OF BLADDER TUMOR WITH GYRUS (TURBT-GYRUS)  1990's?    Current Outpatient Medications  Medication Sig Dispense Refill  . acetaminophen (TYLENOL) 500 MG tablet Take 500 mg by mouth every 6 (six) hours as needed (pain).     Dylan Tyler albuterol (PROVENTIL) (2.5 MG/3ML) 0.083% nebulizer solution Take 2.5 mg by nebulization every 6 (six) hours as needed for wheezing.    . Alcohol Swabs 70 % PADS Use as directed 120 each 2  . ALPRAZolam (XANAX) 0.5 MG tablet Take 0.5 mg by mouth 2 (two) times daily as needed.  1  . BD PEN NEEDLE NANO U/F 32G X 4 MM MISC USE AS DIRECTED FOUR TIMES DAILY. 200 each 5  . carvedilol (COREG) 6.25 MG tablet TAKE ONE TABLET BY MOUTH TWICE DAILY. TAKE WITH A MEAL. 60 tablet 6  . Cholecalciferol (VITAMIN D-3) 1000 UNITS CAPS Take 1,000 Units by mouth 2 (two) times daily.     . cholestyramine light (PREVALITE) 4 g packet SMARTSIG:1 Packet(s) By Mouth Twice Daily PRN    . citalopram (CELEXA) 40 MG tablet Take 20-40 mg by mouth daily.     . Continuous Blood Gluc Receiver (FREESTYLE LIBRE 14 DAY READER) DEVI USE AS DIRECTED. 1 each 0  . Continuous Blood Gluc Sensor (FREESTYLE LIBRE 14 DAY SENSOR) MISC APPLY ONE SENSOR EVERY 14 DAYS. 2 each 2  . ELIQUIS 5 MG TABS tablet TAKE ONE TABLET BY MOUTH TWICE DAILY 60 tablet 6  . ENTRESTO 24-26 MG TAKE ONE TABLET BY MOUTH TWICE DAILY 60 tablet 6  . FREESTYLE LITE test strip USE TO CHECK BLOOD SUGAR FOUR TIMES DAILY. 100 each 5  . furosemide (LASIX) 40 MG tablet TAKE ONE TABLET (40MG ) BY MOUTH EVERY *OTHER* DAY ALTERNATING WITH ONE-HALF TABLET (20MG ) 45 tablet 2  . gabapentin (NEURONTIN) 400 MG capsule Take 400 mg by mouth 3 (three) times daily.     Dylan Tyler glipiZIDE (GLUCOTROL XL) 5 MG 24 hr tablet Take 1 tablet (5 mg total) by mouth daily  with breakfast. 30 tablet 3  . Insulin Pen Needle (B-D ULTRAFINE III SHORT PEN) 31G X 8 MM MISC Use to inject insulin 4 x daily 200 each 3  . ipratropium-albuterol (DUONEB) 0.5-2.5 (3) MG/3ML SOLN Take 3 mLs by nebulization every 4 (four) hours as needed (for shortness of breath).    . Lancets (FREESTYLE) lancets USE TO CHECK BLOOD SUGAR FOUR TIMES DAILY. 200 each 5  . LANTUS SOLOSTAR 100 UNIT/ML Solostar Pen INJECT 100 UNITS INTO THE SKIN AT BEDTIME 90 mL 0  . metoCLOPramide (REGLAN) 5 MG tablet Take 5 mg by mouth every 6 (six) hours as needed for nausea or vomiting.    . nitroGLYCERIN (NITROSTAT) 0.4 MG SL tablet Place 1 tablet (0.4 mg total) under the tongue every 5 (five) minutes as needed for chest pain. 25 tablet 3  . NON FORMULARY 1 each by Other route See admin  instructions. CPAP - Uses nightly    . NOVOLOG FLEXPEN 100 UNIT/ML FlexPen INJECT 25-31 UNITS THREE TIMES DAILY AS DIRECTED WITH MEALS. 90 mL 0  . simvastatin (ZOCOR) 40 MG tablet TAKE ONE TABLET BY MOUTH AT BEDTIME 90 tablet 3  . traMADol (ULTRAM) 50 MG tablet Take 1 tablet by mouth daily as needed (pain).     . TRELEGY ELLIPTA 100-62.5-25 MCG/INH AEPB Inhale 1 puff into the lungs daily.    Dylan Tyler triamcinolone cream (KENALOG) 0.1 % Apply 1 application topically 2 (two) times daily as needed.    . vitamin B-12 (CYANOCOBALAMIN) 1000 MCG tablet Take 1,000 mcg by mouth 2 (two) times daily.    . VOLTAREN 1 % GEL Apply 2 g topically 4 (four) times daily as needed (for pain).      No current facility-administered medications for this visit.   Allergies:  Patient has no known allergies.   Social History: The patient  reports that he has been smoking e-cigarettes. He started smoking about 61 years ago. He has a 25.00 pack-year smoking history. He has quit using smokeless tobacco.  His smokeless tobacco use included chew. He reports current alcohol use of about 2.0 standard drinks of alcohol per week. He reports current drug use. Drug: Cocaine.    Family History: The patient's family history includes Cancer in his father; Heart attack in his mother.   ROS:  Please see the history of present illness. Otherwise, complete review of systems is positive for none.  All other systems are reviewed and negative.   Physical Exam: VS:  BP (!) 100/50   Pulse 82   Ht 5\' 9"  (1.753 m)   Wt 252 lb 3.2 oz (114.4 kg)   SpO2 95%   BMI 37.24 kg/m , BMI Body mass index is 37.24 kg/m.  Wt Readings from Last 3 Encounters:  06/26/20 252 lb 3.2 oz (114.4 kg)  03/19/20 248 lb (112.5 kg)  12/08/19 250 lb 6.4 oz (113.6 kg)    General: Obese patient appears comfortable at rest. Neck: Supple, no elevated JVP or carotid bruits, no thyromegaly. Lungs: Clear to auscultation, nonlabored breathing at rest. Cardiac: Regular rate and rhythm, no S3 or significant systolic murmur, no pericardial rub. Abdomen: Soft, nontender, no hepatomegaly, bowel sounds present, no guarding or rebound. Extremities: No pitting edema, distal pulses 2+. Skin: Warm and dry. Musculoskeletal: No kyphosis. Neuropsychiatric: Alert and oriented x3, affect grossly appropriate.  ECG:    Recent Labwork: 03/15/2020: BUN 22; Creatinine 2.2; Potassium 5.0; TSH 2.65     Component Value Date/Time   CHOL 125 03/15/2020 0000   TRIG 229 (A) 03/15/2020 0000   HDL 30 (A) 03/15/2020 0000   LDLCALC 49 03/15/2020 0000    Other Studies Reviewed Today:  Echocardiogram 12/06/2019: 1. Left ventricular ejection fraction, by estimation, is 40 to 45%. The  left ventricle has mildly decreased function. The left ventricle  demonstrates global hypokinesis. There is mild left ventricular  hypertrophy. Left ventricular diastolic parameters  are indeterminate.  2. Right ventricular systolic function is low normal. The right  ventricular size is normal. Mildly increased right ventricular wall  thickness. There is normal pulmonary artery systolic pressure. The  estimated right ventricular systolic  pressure is 51.7  mmHg.  3. Left atrial size was upper normal.  4. The mitral valve is grossly normal. Trivial mitral valve  regurgitation.  5. The aortic valve is tricuspid. Aortic valve regurgitation is mild.  Mild aortic valve sclerosis is present, with no evidence  of aortic valve  stenosis.  6. The inferior vena cava is normal in size with greater than 50%  respiratory variability, suggesting right atrial pressure of 3 mmHg.   Echocardiogram 04/13/2019 1. Left ventricular ejection fraction, by visual estimation, is 35 to 40%. The left ventricle has moderately decreased function. Normal left ventricular size. There is moderately increased left ventricular hypertrophy. 2. Left ventricular diastolic Doppler parameters are indeterminate pattern of LV diastolic filling. 3. Global right ventricle has normal systolic function.The right ventricular size is mildly enlarged. No increase in right ventricular wall thickness. 4. Left atrial size was mildly dilated. 5. Right atrial size was mildly dilated. 6. Mild aortic valve annular calcification. 7. Mild mitral annular calcification. 8. The mitral valve is degenerative. Mild mitral valve regurgitation. 9. The tricuspid valve is grossly normal. Tricuspid valve regurgitation is mild. 10. The aortic valve is tricuspid Aortic valve regurgitation is mild to moderate by color flow Doppler. Mild aortic valve sclerosis without stenosis. 11. The pulmonic valve was not well visualized. Pulmonic valve regurgitation is not visualized by color flow Doppler. 12. Mildly elevated pulmonary artery systolic pressure. 13. A pacer wire is visualized in the RA and RV. 14. The inferior vena cava is normal in size with greater than 50% respiratory variability, suggesting right atrial pressure of 3 mmHg.   Assessment and Plan:  1. Nonischemic cardiomyopathy ([place])   2. Permanent atrial fibrillation (H[facility])   3. Stage 3b chronic kidney disease ([place])    1.  Nonischemic cardiomyopathy (HCC) Echocardiogram performed on 12/06/2019 showed improvement EF at 40 to 45% compared to previous echo in 04/13/2019 of EF 35 to 40%. LVH global hypokinesis, mild LVH mild AR trivial MR.  He has chronic dyspnea most likely attributed to COPD.  Weight appears to be ranging from 248 pounds to 252 pounds.  Continue Entresto 24/26 mg p.o. twice daily.  Continue Lasix 40mg  OD alternating with 20 mg q. OD  2. Permanent atrial fibrillation ([place]) Last EKG 12/08/2019 showed ventricular paced rhythm with underlying atrial fibrillation/flutter rate of 72.  Continue Eliquis 5 mg p.o. twice daily  3. Stage 3b chronic kidney disease ([place]) Last renal function labs on 03/15/2020: Creatinine 2.15, GFR 29.  5.  Hyperlipidemia Continue simvastatin 40 mg p.o. daily..Last lipid panel collected on 03/15/2020: TG 229, TC 125, HDL 30, LDL 49.   Medication Adjustments/Labs and Tests Ordered: Current medicines are reviewed at length with the patient today.  Concerns regarding medicines are outlined above.   Disposition: Follow-up with Dr. Domenic Polite or APP 6 months  Signed, Levell July, NP 06/26/2020 1:44 PM    [facility] at [place], [place], [place] [address] Phone: [phone]; Fax: [phone]

## 2020-06-26 ENCOUNTER — Encounter: Payer: Self-pay | Admitting: *Deleted

## 2020-06-26 ENCOUNTER — Ambulatory Visit: Payer: PPO | Admitting: Family Medicine

## 2020-06-26 ENCOUNTER — Encounter: Payer: Self-pay | Admitting: Family Medicine

## 2020-06-26 VITALS — BP 100/50 | HR 82 | Ht 69.0 in | Wt 252.2 lb

## 2020-06-26 DIAGNOSIS — I4821 Permanent atrial fibrillation: Secondary | ICD-10-CM | POA: Diagnosis not present

## 2020-06-26 DIAGNOSIS — N1832 Chronic kidney disease, stage 3b: Secondary | ICD-10-CM

## 2020-06-26 DIAGNOSIS — I428 Other cardiomyopathies: Secondary | ICD-10-CM

## 2020-06-26 NOTE — Patient Instructions (Addendum)

## 2020-07-08 ENCOUNTER — Other Ambulatory Visit: Payer: Self-pay | Admitting: "Endocrinology

## 2020-07-09 ENCOUNTER — Telehealth: Payer: Self-pay

## 2020-07-09 ENCOUNTER — Other Ambulatory Visit: Payer: Self-pay | Admitting: Cardiology

## 2020-07-09 MED ORDER — FREESTYLE LIBRE 14 DAY READER DEVI: 0 refills | Status: DC

## 2020-07-09 NOTE — Telephone Encounter (Signed)
Patient said that his Continuous Blood Gluc Receiver (FREESTYLE LIBRE 14 DAY READER) DEVICE is messed up. Can you call in another one?

## 2020-07-09 NOTE — Telephone Encounter (Signed)
Sent in

## 2020-07-22 ENCOUNTER — Ambulatory Visit: Payer: PPO | Admitting: Nurse Practitioner

## 2020-07-24 ENCOUNTER — Ambulatory Visit: Payer: PPO | Admitting: Nurse Practitioner

## 2020-07-24 NOTE — Patient Instructions (Incomplete)

## 2020-07-25 DIAGNOSIS — D72829 Elevated white blood cell count, unspecified: Secondary | ICD-10-CM | POA: Diagnosis not present

## 2020-07-26 ENCOUNTER — Ambulatory Visit: Payer: PPO | Admitting: Nurse Practitioner

## 2020-07-29 ENCOUNTER — Other Ambulatory Visit: Payer: Self-pay

## 2020-07-29 ENCOUNTER — Encounter: Payer: Self-pay | Admitting: Nurse Practitioner

## 2020-07-29 ENCOUNTER — Ambulatory Visit (INDEPENDENT_AMBULATORY_CARE_PROVIDER_SITE_OTHER): Payer: PPO | Admitting: Nurse Practitioner

## 2020-07-29 VITALS — BP 99/65 | HR 72 | Ht 69.0 in | Wt 251.4 lb

## 2020-07-29 DIAGNOSIS — N183 Chronic kidney disease, stage 3 unspecified: Secondary | ICD-10-CM | POA: Diagnosis not present

## 2020-07-29 DIAGNOSIS — E1165 Type 2 diabetes mellitus with hyperglycemia: Secondary | ICD-10-CM

## 2020-07-29 DIAGNOSIS — I10 Essential (primary) hypertension: Secondary | ICD-10-CM

## 2020-07-29 DIAGNOSIS — E782 Mixed hyperlipidemia: Secondary | ICD-10-CM

## 2020-07-29 DIAGNOSIS — IMO0002 Reserved for concepts with insufficient information to code with codable children: Secondary | ICD-10-CM

## 2020-07-29 DIAGNOSIS — E1122 Type 2 diabetes mellitus with diabetic chronic kidney disease: Secondary | ICD-10-CM | POA: Diagnosis not present

## 2020-07-29 NOTE — Progress Notes (Signed)
07/29/2020            Endocrinology follow-up note   Subjective:    Patient ID: Dylan Tyler, male    DOB: [birth year].  he is being seen in follow-up  for management of currently uncontrolled, complicated type 2 diabetes, hyperlipidemia, hypertension.   PMD:  Glenda Chroman, MD.   Past Medical History:  Diagnosis Date  . Ankylosing spondylitis ([place])   . Anxiety   . Aortic root dilatation ([place])   . AR (aortic regurgitation)   . Atrial fibrillation and flutter ([place])   . Bladder cancer ([place])   . Chronic pain    Followed at [facility]  . COPD (chronic obstructive pulmonary disease) ([place])   . DDD (degenerative disc disease), lumbosacral   . DDD (degenerative disc disease), thoracolumbar   . Degenerative cervical disc   . Depression   . Diabetes mellitus type II   . Diverticulitis   . Essential hypertension   . GERD (gastroesophageal reflux disease)   . History of kidney stones   . Hyperlipidemia   . Migraine   . Nonischemic cardiomyopathy ([place])    Cardiolite 12/09 LVEF 55%, minor luminal irregularities at cardiac catheterization  . OSA on CPAP   . Rheumatoid arthritis ([place])   . Symptomatic bradycardia    Medtronic Advisa L dual-chamber pacemaker  05/2015 - Dr. Curt Bears   Past Surgical History:  Procedure Laterality Date  . CARDIAC CATHETERIZATION  08/13/11  . CARDIOVERSION N/A 07/02/2015   Procedure: CARDIOVERSION;  Surgeon: Sanda Klein, MD;  Location: [facility];  Service: Cardiovascular;  Laterality: N/A;  . CATARACT EXTRACTION W/PHACO Left 02/10/2019   Procedure: CATARACT EXTRACTION PHACO AND INTRAOCULAR LENS PLACEMENT ([place]);  Surgeon: Baruch Goldmann, MD;  Location: [facility];  Service: Ophthalmology;  Laterality: Left;  CDE: 7.10  . CATARACT EXTRACTION W/PHACO Right 02/27/2019   Procedure: CATARACT EXTRACTION PHACO AND INTRAOCULAR LENS PLACEMENT RIGHT EYE CDE=11.84;  Surgeon: Baruch Goldmann, MD;  Location: [facility];  Service: Ophthalmology;  Laterality: Right;  right  .  CYSTOSCOPY WITH HOLMIUM LASER LITHOTRIPSY  1990's  . EP IMPLANTABLE DEVICE N/A 05/21/2015   MDT Advisa DR MRI pacemaker implanted by Dr Curt Bears  . KNEE ARTHROSCOPY Right ~ 1991  . LAPAROSCOPIC CHOLECYSTECTOMY  ~ 2008  . TRANSURETHRAL RESECTION OF BLADDER TUMOR WITH GYRUS (TURBT-GYRUS)  1990's?   Social History   Socioeconomic History  . Marital status: Divorced    Spouse name: Not on file  . Number of children: Not on file  . Years of education: Not on file  . Highest education level: Not on file  Occupational History  . Occupation: Disabled    Employer: DISABLED  Tobacco Use  . Smoking status: Current Every Day Smoker    Packs/day: 0.50    Years: 50.00    Pack years: 25.00    Types: E-cigarettes    Start date: 02/26/1959  . Smokeless tobacco: Former Systems developer    Types: Chew  . Tobacco comment: 05/21/2015 "quit chewing in 1992"  Vaping Use  . Vaping Use: Every day  . Substances: Nicotine, Flavoring  Substance and Sexual Activity  . Alcohol use: Yes    Alcohol/week: 2.0 standard drinks    Types: 2 Cans of beer per week  . Drug use: Yes    Types: Cocaine    Comment: 05/21/2015 "quit in the early 2000"  . Sexual activity: Not on file  Other Topics Concern  . Not on file  Social History Narrative  Divorced   No regular exercise   Social Determinants of Health   Financial Resource Strain: Not on file  Food Insecurity: Not on file  Transportation Needs: Not on file  Physical Activity: Not on file  Stress: Not on file  Social Connections: Not on file   Outpatient Encounter Medications as of 07/29/2020  Medication Sig  . acetaminophen (TYLENOL) 500 MG tablet Take 500 mg by mouth every 6 (six) hours as needed (pain).   Marland Kitchen albuterol (PROVENTIL) (2.5 MG/3ML) 0.083% nebulizer solution Take 2.5 mg by nebulization every 6 (six) hours as needed for wheezing.  . Alcohol Swabs 70 % PADS Use as directed  . ALPRAZolam (XANAX) 0.5 MG tablet Take 0.5 mg by mouth 2 (two) times daily as  needed.  . BD PEN NEEDLE NANO U/F 32G X 4 MM MISC USE AS DIRECTED FOUR TIMES DAILY.  . carvedilol (COREG) 6.25 MG tablet TAKE ONE TABLET BY MOUTH TWICE DAILY. TAKE WITH A MEAL.  Marland Kitchen Cholecalciferol (VITAMIN D-3) 1000 UNITS CAPS Take 1,000 Units by mouth 2 (two) times daily.   . cholestyramine light (PREVALITE) 4 g packet SMARTSIG:1 Packet(s) By Mouth Twice Daily PRN  . citalopram (CELEXA) 40 MG tablet Take 20-40 mg by mouth daily.   . Continuous Blood Gluc Receiver (FREESTYLE LIBRE 14 DAY READER) DEVI USE AS DIRECTED.  Marland Kitchen Continuous Blood Gluc Sensor (FREESTYLE LIBRE 14 DAY SENSOR) MISC APPLY ONE SENSOR EVERY 14 DAYS.  Marland Kitchen ELIQUIS 5 MG TABS tablet TAKE ONE TABLET BY MOUTH TWICE DAILY  . ENTRESTO 24-26 MG TAKE ONE TABLET BY MOUTH TWICE DAILY  . FREESTYLE LITE test strip USE TO CHECK BLOOD SUGAR FOUR TIMES DAILY.  . furosemide (LASIX) 40 MG tablet TAKE ONE TABLET (40MG ) BY MOUTH EVERY *OTHER* DAY ALTERNATING WITH ONE-HALF TABLET (20MG )  . gabapentin (NEURONTIN) 400 MG capsule Take 400 mg by mouth 3 (three) times daily.   . Insulin Pen Needle (B-D ULTRAFINE III SHORT PEN) 31G X 8 MM MISC Use to inject insulin 4 x daily  . ipratropium-albuterol (DUONEB) 0.5-2.5 (3) MG/3ML SOLN Take 3 mLs by nebulization every 4 (four) hours as needed (for shortness of breath).  . Lancets (FREESTYLE) lancets USE TO CHECK BLOOD SUGAR FOUR TIMES DAILY.  Marland Kitchen LANTUS SOLOSTAR 100 UNIT/ML Solostar Pen INJECT 100 UNITS INTO THE SKIN AT BEDTIME  . metoCLOPramide (REGLAN) 5 MG tablet Take 5 mg by mouth every 6 (six) hours as needed for nausea or vomiting.  . nitroGLYCERIN (NITROSTAT) 0.4 MG SL tablet Place 1 tablet (0.4 mg total) under the tongue every 5 (five) minutes as needed for chest pain.  . NON FORMULARY 1 each by Other route See admin instructions. CPAP - Uses nightly  . NOVOLOG FLEXPEN 100 UNIT/ML FlexPen INJECT 25-31 UNITS THREE TIMES DAILY AS DIRECTED WITH MEALS.  . simvastatin (ZOCOR) 40 MG tablet TAKE ONE TABLET BY  MOUTH AT BEDTIME  . traMADol (ULTRAM) 50 MG tablet Take 1 tablet by mouth daily as needed (pain).   . TRELEGY ELLIPTA 100-62.5-25 MCG/INH AEPB Inhale 1 puff into the lungs daily.  Marland Kitchen triamcinolone cream (KENALOG) 0.1 % Apply 1 application topically 2 (two) times daily as needed.  . vitamin B-12 (CYANOCOBALAMIN) 1000 MCG tablet Take 1,000 mcg by mouth 2 (two) times daily.  . VOLTAREN 1 % GEL Apply 2 g topically 4 (four) times daily as needed (for pain).   . [DISCONTINUED] glipiZIDE (GLUCOTROL XL) 5 MG 24 hr tablet Take 1 tablet (5 mg total) by mouth daily with  breakfast. (Patient not taking: Reported on 07/29/2020)   No facility-administered encounter medications on file as of 07/29/2020.    ALLERGIES: No Known Allergies  VACCINATION STATUS:  There is no immunization history on file for this patient.  Diabetes He presents for his follow-up diabetic visit. He has type 2 diabetes mellitus. Onset time: He was diagnosed at approximate age of 22 years. His disease course has been improving (His most recent A1c was 9.8% on 01/12/2017.). Hypoglycemia symptoms include nervousness/anxiousness and tremors. Pertinent negatives for hypoglycemia include no confusion, hunger, pallor or seizures. (Nausea, hypothermia) Associated symptoms include foot paresthesias. Pertinent negatives for diabetes include no fatigue, no polydipsia, no polyphagia, no polyuria and no weakness. There are no hypoglycemic complications. Symptoms are improving. Diabetic complications include heart disease and peripheral neuropathy. Risk factors for coronary artery disease include dyslipidemia, diabetes mellitus, hypertension, male sex, sedentary lifestyle, tobacco exposure, family history and obesity. Current diabetic treatment includes intensive insulin program and oral agent (monotherapy). He is compliant with treatment most of the time. His weight is fluctuating minimally. He is following a generally unhealthy diet. When asked about  meal planning, he reported none. He has not had a previous visit with a dietitian. He never [employer] with cane due to bilateral knee arthritis) participates in exercise. His home blood glucose trend is decreasing steadily. His breakfast blood glucose range is generally 140-180 mg/dl. His lunch blood glucose range is generally 140-180 mg/dl. His dinner blood glucose range is generally 110-130 mg/dl. His bedtime blood glucose range is generally 110-130 mg/dl. (He presents today, accompanied by his wife, with his CGM and logs showing greatly improved glycemic profile overall.  His most recent A1c was 7.1% on 06/25/20, too early to be checked today.  Analysis of his CGM shows TIR 80%, TAR 18%, TBR 2%.  He has some mild hypoglycemia noted usually occurring around dinner and bedtime (associated with skipping meals or meal timing).) An ACE inhibitor/angiotensin II receptor blocker is being taken. He sees a podiatrist.Eye exam is current.  Hyperlipidemia This is a chronic problem. The current episode started more than 1 year ago. The problem is uncontrolled. Recent lipid tests were reviewed and are variable. Exacerbating diseases include chronic renal disease, diabetes and obesity. Factors aggravating his hyperlipidemia include smoking, fatty foods and beta blockers. Pertinent negatives include no myalgias or shortness of breath. Current antihyperlipidemic treatment includes statins. The current treatment provides mild improvement of lipids. Compliance problems include adherence to diet and adherence to exercise.  Risk factors for coronary artery disease include dyslipidemia, diabetes mellitus, hypertension, a sedentary lifestyle, male sex and obesity.    Review of systems  Constitutional: + Minimally fluctuating body weight,  current  Body mass index is 37.13 kg/m. , + fatigue, no subjective hyperthermia, no subjective hypothermia Eyes: no blurry vision, no xerophthalmia ENT: no sore throat, no nodules palpated in  throat, no dysphagia/odynophagia, no hoarseness Cardiovascular: no Chest Pain, no Shortness of Breath, no palpitations, no leg swelling Respiratory: chronic cough (smoker), intermittent shortness of breath Gastrointestinal: no Nausea/Vomiting/Diarrhea Musculoskeletal: bilateral knee pain (under evaluation for bilateral TKR) and right shoulder pain Skin: no rashes, no hyperemia Neurological: no tremors, no numbness, no tingling, + intermittent dizziness Psychiatric: no depression, no anxiety   Objective:    BP 99/65 (BP Location: Left Arm, Patient Position: Sitting)   Pulse 72   Ht 5\' 9"  (1.753 m)   Wt 251 lb 6.4 oz (114 kg)   BMI 37.13 kg/m   Wt Readings from Last 3 Encounters:  07/29/20 251 lb 6.4 oz (114 kg)  06/26/20 252 lb 3.2 oz (114.4 kg)  03/19/20 248 lb (112.5 kg)    BP Readings from Last 3 Encounters:  07/29/20 99/65  06/26/20 (!) 100/50  03/19/20 139/82    Physical Exam- Limited  Constitutional:  Body mass index is 37.13 kg/m. , not in acute distress, normal state of mind Eyes:  EOMI, no exophthalmos Neck: Supple Cardiovascular: RRR, no murmers, rubs, or gallops, no edema Respiratory: SOB at rest with chronic congested cough, no crackles, rales, rhonchi, or wheezing Musculoskeletal: no gross deformities, strength intact in all four extremities, + walks with cane due to bilateral knee arthritis Skin:  no rashes, no hyperemia Neurological: no tremor with outstretched hands   Lipid Panel     Component Value Date/Time   CHOL 157 06/25/2020 0000   TRIG 155 06/25/2020 0000   HDL 44 06/25/2020 0000   LDLCALC 86 06/25/2020 0000   Recent Results (from the past 2160 hour(s))  CUP PACEART REMOTE DEVICE CHECK     Status: None   Collection Time: 06/04/20 12:17 PM  Result Value Ref Range   Date Time Interrogation Session [phone]    Pulse Generator Manufacturer MERM    Pulse Gen Model [id number] Advisa DR MRI    Pulse Gen Serial Number [id number]    Clinic Name  [facility]    Implantable Pulse Generator Type Implantable Pulse Generator    Implantable Pulse Generator Implant Date [id number]    Implantable Lead Manufacturer MERM    Implantable Lead Model 5076 CapSureFix Novus    Implantable Lead Serial Number [id number]    Implantable Lead Implant Date [id number]    Implantable Lead Location Detail 1 UNKNOWN    Implantable Lead Location [id number]    Implantable Lead Manufacturer MERM    Implantable Lead Model 5076 CapSureFix Novus    Implantable Lead Serial Number [id number]    Implantable Lead Implant Date [id number]    Implantable Lead Location Detail 1 UNKNOWN    Implantable Lead Location [phone]    Lead Channel Setting Sensing Sensitivity 2.8 mV   Lead Channel Setting Pacing Pulse Width 0.4 ms   Lead Channel Setting Pacing Amplitude 2.5 V   Lead Channel Impedance Value 513 ohm   Lead Channel Impedance Value 361 ohm   Lead Channel Sensing Intrinsic Amplitude 1.375 mV   Lead Channel Sensing Intrinsic Amplitude 1.375 mV   Lead Channel Pacing Threshold Amplitude 0.5 V   Lead Channel Pacing Threshold Pulse Width 0.4 ms   Lead Channel Impedance Value 437 ohm   Lead Channel Impedance Value 342 ohm   Lead Channel Sensing Intrinsic Amplitude 23.125 mV   Lead Channel Sensing Intrinsic Amplitude 23.125 mV   Lead Channel Pacing Threshold Amplitude 0.875 V   Lead Channel Pacing Threshold Pulse Width 0.4 ms   Battery Status OK    Battery Remaining Longevity 27 mo   Battery Voltage 2.94 V   Brady Statistic RA Percent Paced 0 %   Brady Statistic RV Percent Paced 94.49 %  Basic metabolic panel     Status: Abnormal   Collection Time: 06/25/20 12:00 AM  Result Value Ref Range   BUN 25 (A) 4 - 21   Creatinine 1.6 (A) 0.6 - 1.3  Comprehensive metabolic panel     Status: None   Collection Time: 06/25/20 12:00 AM  Result Value Ref Range   GFR calc Af Amer 49    GFR calc non Af Amer 42    Calcium  9.2 8.7 - 10.7  Lipid panel     Status: None   Collection  Time: 06/25/20 12:00 AM  Result Value Ref Range   Triglycerides 155 40 - 160   Cholesterol 157 0 - 200   HDL 44 35 - 70   LDL Cholesterol 86   Hemoglobin A1c     Status: None   Collection Time: 06/25/20 12:00 AM  Result Value Ref Range   Hemoglobin A1C 7.1   TSH     Status: None   Collection Time: 06/25/20 12:00 AM  Result Value Ref Range   TSH 1.89 0.41 - 5.90   CMP Latest Ref Rng & Units 06/25/2020 03/15/2020 06/08/2019  Glucose 70 - 99 mg/dL - - -  BUN 4 - 21 25(A) 22(A) 20  Creatinine 0.6 - 1.3 1.6(A) 2.2(A) 1.6(A)  Sodium 137 - 147 - - 138  Potassium 3.4 - 5.3 - 5.0 5.2  Chloride 99 - 108 - - 102  CO2 13 - 22 - - 25(A)  Calcium 8.7 - 10.7 9.2 9.3 9.4  Total Protein 6.5 - 8.1 g/dL - - -  Total Bilirubin 0.3 - 1.2 mg/dL - - -  Alkaline Phos 25 - 125 - - 81  AST 14 - 40 - - 21  ALT 10 - 40 - - 38     Assessment & Plan:   1) Type 2 diabetes mellitus with stage 3 chronic kidney disease, without long-term current use of insulin (HCC)  - Patient has currently uncontrolled symptomatic type 2 DM since  75 years of age.  He presents today, accompanied by his wife, with his CGM and logs showing greatly improved glycemic profile overall.  His most recent A1c was 7.1% on 06/25/20, too early to be checked today.  Analysis of his CGM shows TIR 80%, TAR 18%, TBR 2%.  He has some mild hypoglycemia noted usually occurring around dinner and bedtime (associated with skipping meals or meal timing).  -his diabetes is complicated by stage 3 renal insufficiency (improving), obesity/sedentary life, neuropathy, cardiomyopathy, chronic heavy smoking, inadequate insurance and DERRIS MILLAN remains at a high risk for more acute and chronic complications which include CAD, CVA, CKD, retinopathy, and neuropathy. These are all discussed in detail with the patient.  - Nutritional counseling repeated at each appointment due to patients tendency to fall back in to old habits.  - The patient admits there  is a room for improvement in their diet and drink choices. -  Suggestion is made for the patient to avoid simple carbohydrates from their diet including Cakes, Sweet Desserts / Pastries, Ice Cream, Soda (diet and regular), Sweet Tea, Candies, Chips, Cookies, Sweet Pastries,  Store Bought Juices, Alcohol in Excess of  1-2 drinks a day, Artificial Sweeteners, Coffee Creamer, and "Sugar-free" Products. This will help patient to have stable blood glucose profile and potentially avoid unintended weight gain.   - I encouraged the patient to switch to  unprocessed or minimally processed complex starch and increased protein intake (animal or plant source), fruits, and vegetables.   - Patient is advised to stick to a routine mealtimes to eat 3 meals  a day and avoid unnecessary snacks ( to snack only to correct hypoglycemia).  - I have approached him with the following individualized plan to manage diabetes and patient agrees:   - Based on his glucose profile, he will continue to need intensive treatment with higher dose of basal/bolus insulin in order for him to achieve control of  diabetes to target.    -Given his stable, improved glycemic profile, he is to continue Lantus 100 units SQ daily at bedtime and continue Novolog 25-31 units TID with meals if blood glucose above 90 and he is eating.  -He is encouraged to continue using his CGM to monitor blood glucose 4 times per day, before meals and at bedtime and report to the clinic if blood glucose levels are less than 70 or greater than 200 for 3 tests in a row.  - He is not a candidate for metformin therapy due to CKD.  - He is not a candidate for incretin therapy (such as Ozempic) due to hypertriglyceridemia and heavy smoking history.  2) Lipids/HPL:  His recent lipid panel from 06/25/20 shows controlled LDL of 86 and improved triglycerides of 155.  He is advised to continue Cholestyramine 4 g TID with meals and Simvastatin 40 mg po daily at bedtime.   Side effects and precautions discussed with him.  3) Hypertension His blood pressure is controlled to target (slightly low for him today).  He is advised to continue Coreg 6.25 mg po twice daily, Lasix 40 mg po every other day (alternating with 20 mg), and Entresto 24-26 mg po twice daily.  He is encouraged to follow up with his PCP regarding lowe BP readings.  4) Chronic Care/Health Maintenance: -he  is on ACEI/ARB and Statin medications and  is encouraged to continue to follow up with Ophthalmology, Dentist,  Podiatrist at least yearly or according to recommendations. - I have recommended yearly flu vaccine and pneumonia vaccination at least every 5 years; and  sleep for at least 7 hours a day.  The patient was counseled on the dangers of tobacco use, and was advised to quit.  Reviewed strategies to maximize success, including removing cigarettes and smoking materials from environment.  5) Weight management:  His Body mass index is 37.13 kg/m. He is a candidate for modest weight loss.  Exercise regimen and carbs restrictions were detailed with him.   - I advised patient to maintain close follow up with Glenda Chroman, MD for primary care needs.  - Time spent on this patient care encounter:  35 min, of which > 50% was spent in  counseling and the rest reviewing his blood glucose logs , discussing his hypoglycemia and hyperglycemia episodes, reviewing his current and  previous labs / studies  ( including abstraction from other facilities) and medications  doses and developing a  long term treatment plan and documenting his care.   Please refer to Patient Instructions for Blood Glucose Monitoring and Insulin/Medications Dosing Guide"  in media tab for additional information. Please  also refer to " Patient Self Inventory" in the Media  tab for reviewed elements of pertinent patient history.  [name] participated in the discussions, expressed understanding, and voiced agreement with the above  plans.  All questions were answered to his satisfaction. he is encouraged to contact clinic should he have any questions or concerns prior to his return visit.   Follow up plan: - Return in about 4 months (around 11/26/2020) for Diabetes follow up with A1c in office, No previsit labs, Bring glucometer and logs.    Rayetta Pigg, [facility] [address] [place], [place] [address] Phone: [phone] Fax: [phone]   07/29/2020, 3:06 PM

## 2020-07-29 NOTE — Patient Instructions (Signed)

## 2020-07-31 DIAGNOSIS — I10 Essential (primary) hypertension: Secondary | ICD-10-CM | POA: Diagnosis not present

## 2020-07-31 DIAGNOSIS — F17210 Nicotine dependence, cigarettes, uncomplicated: Secondary | ICD-10-CM | POA: Diagnosis not present

## 2020-07-31 DIAGNOSIS — E1165 Type 2 diabetes mellitus with hyperglycemia: Secondary | ICD-10-CM | POA: Diagnosis not present

## 2020-07-31 DIAGNOSIS — E1142 Type 2 diabetes mellitus with diabetic polyneuropathy: Secondary | ICD-10-CM | POA: Diagnosis not present

## 2020-07-31 DIAGNOSIS — Z299 Encounter for prophylactic measures, unspecified: Secondary | ICD-10-CM | POA: Diagnosis not present

## 2020-07-31 DIAGNOSIS — I4891 Unspecified atrial fibrillation: Secondary | ICD-10-CM | POA: Diagnosis not present

## 2020-08-01 DIAGNOSIS — M25512 Pain in left shoulder: Secondary | ICD-10-CM | POA: Diagnosis not present

## 2020-08-05 ENCOUNTER — Other Ambulatory Visit: Payer: Self-pay

## 2020-08-05 DIAGNOSIS — IMO0002 Reserved for concepts with insufficient information to code with codable children: Secondary | ICD-10-CM

## 2020-08-05 DIAGNOSIS — M25512 Pain in left shoulder: Secondary | ICD-10-CM | POA: Diagnosis not present

## 2020-08-05 DIAGNOSIS — E1122 Type 2 diabetes mellitus with diabetic chronic kidney disease: Secondary | ICD-10-CM

## 2020-08-05 MED ORDER — LANTUS SOLOSTAR 100 UNIT/ML ~~LOC~~ SOPN: 100.0000 [IU] | PEN_INJECTOR | Freq: Every day | SUBCUTANEOUS | 3 refills | Status: DC

## 2020-08-09 ENCOUNTER — Other Ambulatory Visit: Payer: Self-pay | Admitting: Cardiology

## 2020-08-13 DIAGNOSIS — E1143 Type 2 diabetes mellitus with diabetic autonomic (poly)neuropathy: Secondary | ICD-10-CM | POA: Diagnosis not present

## 2020-08-13 DIAGNOSIS — Z299 Encounter for prophylactic measures, unspecified: Secondary | ICD-10-CM | POA: Diagnosis not present

## 2020-08-13 DIAGNOSIS — F330 Major depressive disorder, recurrent, mild: Secondary | ICD-10-CM | POA: Diagnosis not present

## 2020-08-13 DIAGNOSIS — I10 Essential (primary) hypertension: Secondary | ICD-10-CM | POA: Diagnosis not present

## 2020-08-13 DIAGNOSIS — E785 Hyperlipidemia, unspecified: Secondary | ICD-10-CM | POA: Diagnosis not present

## 2020-08-13 DIAGNOSIS — D6869 Other thrombophilia: Secondary | ICD-10-CM | POA: Diagnosis not present

## 2020-08-13 DIAGNOSIS — F419 Anxiety disorder, unspecified: Secondary | ICD-10-CM | POA: Diagnosis not present

## 2020-08-13 DIAGNOSIS — E1169 Type 2 diabetes mellitus with other specified complication: Secondary | ICD-10-CM | POA: Diagnosis not present

## 2020-08-13 DIAGNOSIS — J449 Chronic obstructive pulmonary disease, unspecified: Secondary | ICD-10-CM | POA: Diagnosis not present

## 2020-08-13 DIAGNOSIS — I428 Other cardiomyopathies: Secondary | ICD-10-CM | POA: Diagnosis not present

## 2020-08-13 DIAGNOSIS — I130 Hypertensive heart and chronic kidney disease with heart failure and stage 1 through stage 4 chronic kidney disease, or unspecified chronic kidney disease: Secondary | ICD-10-CM | POA: Diagnosis not present

## 2020-08-13 DIAGNOSIS — G4733 Obstructive sleep apnea (adult) (pediatric): Secondary | ICD-10-CM | POA: Diagnosis not present

## 2020-08-13 DIAGNOSIS — E1151 Type 2 diabetes mellitus with diabetic peripheral angiopathy without gangrene: Secondary | ICD-10-CM | POA: Diagnosis not present

## 2020-08-13 DIAGNOSIS — I4891 Unspecified atrial fibrillation: Secondary | ICD-10-CM | POA: Diagnosis not present

## 2020-08-13 DIAGNOSIS — E1122 Type 2 diabetes mellitus with diabetic chronic kidney disease: Secondary | ICD-10-CM | POA: Diagnosis not present

## 2020-08-13 DIAGNOSIS — E1159 Type 2 diabetes mellitus with other circulatory complications: Secondary | ICD-10-CM | POA: Diagnosis not present

## 2020-09-04 ENCOUNTER — Other Ambulatory Visit: Payer: Self-pay | Admitting: "Endocrinology

## 2020-09-16 DIAGNOSIS — M170 Bilateral primary osteoarthritis of knee: Secondary | ICD-10-CM | POA: Diagnosis not present

## 2020-09-25 ENCOUNTER — Other Ambulatory Visit: Payer: Self-pay | Admitting: Nurse Practitioner

## 2020-09-30 DIAGNOSIS — Z720 Tobacco use: Secondary | ICD-10-CM | POA: Diagnosis not present

## 2020-09-30 DIAGNOSIS — J449 Chronic obstructive pulmonary disease, unspecified: Secondary | ICD-10-CM | POA: Diagnosis not present

## 2020-09-30 DIAGNOSIS — G8929 Other chronic pain: Secondary | ICD-10-CM | POA: Diagnosis not present

## 2020-09-30 DIAGNOSIS — M25562 Pain in left knee: Secondary | ICD-10-CM | POA: Diagnosis not present

## 2020-09-30 DIAGNOSIS — M25561 Pain in right knee: Secondary | ICD-10-CM | POA: Diagnosis not present

## 2020-09-30 DIAGNOSIS — M170 Bilateral primary osteoarthritis of knee: Secondary | ICD-10-CM | POA: Diagnosis not present

## 2020-10-11 DIAGNOSIS — E1165 Type 2 diabetes mellitus with hyperglycemia: Secondary | ICD-10-CM | POA: Diagnosis not present

## 2020-10-11 DIAGNOSIS — E1142 Type 2 diabetes mellitus with diabetic polyneuropathy: Secondary | ICD-10-CM | POA: Diagnosis not present

## 2020-10-11 DIAGNOSIS — Z299 Encounter for prophylactic measures, unspecified: Secondary | ICD-10-CM | POA: Diagnosis not present

## 2020-10-11 DIAGNOSIS — J449 Chronic obstructive pulmonary disease, unspecified: Secondary | ICD-10-CM | POA: Diagnosis not present

## 2020-10-11 DIAGNOSIS — I10 Essential (primary) hypertension: Secondary | ICD-10-CM | POA: Diagnosis not present

## 2020-10-28 DIAGNOSIS — M19012 Primary osteoarthritis, left shoulder: Secondary | ICD-10-CM | POA: Diagnosis not present

## 2020-11-01 ENCOUNTER — Other Ambulatory Visit: Payer: Self-pay | Admitting: Nurse Practitioner

## 2020-11-01 DIAGNOSIS — IMO0002 Reserved for concepts with insufficient information to code with codable children: Secondary | ICD-10-CM

## 2020-11-01 DIAGNOSIS — N183 Chronic kidney disease, stage 3 unspecified: Secondary | ICD-10-CM

## 2020-11-08 DIAGNOSIS — M1711 Unilateral primary osteoarthritis, right knee: Secondary | ICD-10-CM | POA: Diagnosis not present

## 2020-11-15 DIAGNOSIS — F17210 Nicotine dependence, cigarettes, uncomplicated: Secondary | ICD-10-CM | POA: Diagnosis not present

## 2020-11-15 DIAGNOSIS — R609 Edema, unspecified: Secondary | ICD-10-CM | POA: Diagnosis not present

## 2020-11-15 DIAGNOSIS — Z299 Encounter for prophylactic measures, unspecified: Secondary | ICD-10-CM | POA: Diagnosis not present

## 2020-11-15 DIAGNOSIS — M459 Ankylosing spondylitis of unspecified sites in spine: Secondary | ICD-10-CM | POA: Diagnosis not present

## 2020-11-15 DIAGNOSIS — E1165 Type 2 diabetes mellitus with hyperglycemia: Secondary | ICD-10-CM | POA: Diagnosis not present

## 2020-11-15 DIAGNOSIS — F419 Anxiety disorder, unspecified: Secondary | ICD-10-CM | POA: Diagnosis not present

## 2020-11-15 DIAGNOSIS — I10 Essential (primary) hypertension: Secondary | ICD-10-CM | POA: Diagnosis not present

## 2020-11-22 DIAGNOSIS — Z794 Long term (current) use of insulin: Secondary | ICD-10-CM | POA: Diagnosis not present

## 2020-11-22 DIAGNOSIS — N184 Chronic kidney disease, stage 4 (severe): Secondary | ICD-10-CM | POA: Diagnosis not present

## 2020-11-22 DIAGNOSIS — N186 End stage renal disease: Secondary | ICD-10-CM | POA: Diagnosis not present

## 2020-11-22 DIAGNOSIS — E1122 Type 2 diabetes mellitus with diabetic chronic kidney disease: Secondary | ICD-10-CM | POA: Diagnosis not present

## 2020-11-26 ENCOUNTER — Other Ambulatory Visit: Payer: Self-pay

## 2020-11-26 ENCOUNTER — Ambulatory Visit (INDEPENDENT_AMBULATORY_CARE_PROVIDER_SITE_OTHER): Payer: PPO | Admitting: Nurse Practitioner

## 2020-11-26 ENCOUNTER — Encounter: Payer: Self-pay | Admitting: Nurse Practitioner

## 2020-11-26 VITALS — BP 157/90 | HR 72 | Ht 69.0 in | Wt 251.0 lb

## 2020-11-26 DIAGNOSIS — E1122 Type 2 diabetes mellitus with diabetic chronic kidney disease: Secondary | ICD-10-CM

## 2020-11-26 DIAGNOSIS — IMO0002 Reserved for concepts with insufficient information to code with codable children: Secondary | ICD-10-CM

## 2020-11-26 DIAGNOSIS — R6889 Other general symptoms and signs: Secondary | ICD-10-CM

## 2020-11-26 DIAGNOSIS — E1165 Type 2 diabetes mellitus with hyperglycemia: Secondary | ICD-10-CM

## 2020-11-26 DIAGNOSIS — N183 Chronic kidney disease, stage 3 unspecified: Secondary | ICD-10-CM | POA: Diagnosis not present

## 2020-11-26 NOTE — Progress Notes (Signed)
11/26/2020            Endocrinology follow-up note   Subjective:    Patient ID: Dylan Tyler, male    DOB: [birth year].  he is being seen in follow-up  for management of currently uncontrolled, complicated type 2 diabetes, hyperlipidemia, hypertension.   PMD:  Glenda Chroman, MD.   Past Medical History:  Diagnosis Date  . Ankylosing spondylitis ([place])   . Anxiety   . Aortic root dilatation ([place])   . AR (aortic regurgitation)   . Atrial fibrillation and flutter ([place])   . Bladder cancer ([place])   . Chronic pain    Followed at [facility]  . COPD (chronic obstructive pulmonary disease) ([place])   . DDD (degenerative disc disease), lumbosacral   . DDD (degenerative disc disease), thoracolumbar   . Degenerative cervical disc   . Depression   . Diabetes mellitus type II   . Diverticulitis   . Essential hypertension   . GERD (gastroesophageal reflux disease)   . History of kidney stones   . Hyperlipidemia   . Migraine   . Nonischemic cardiomyopathy ([place])    Cardiolite 12/09 LVEF 55%, minor luminal irregularities at cardiac catheterization  . OSA on CPAP   . Rheumatoid arthritis ([place])   . Symptomatic bradycardia    Medtronic Advisa L dual-chamber pacemaker  05/2015 - Dr. Curt Bears   Past Surgical History:  Procedure Laterality Date  . CARDIAC CATHETERIZATION  08/13/11  . CARDIOVERSION N/A 07/02/2015   Procedure: CARDIOVERSION;  Surgeon: Sanda Klein, MD;  Location: [facility];  Service: Cardiovascular;  Laterality: N/A;  . CATARACT EXTRACTION W/PHACO Left 02/10/2019   Procedure: CATARACT EXTRACTION PHACO AND INTRAOCULAR LENS PLACEMENT ([place]);  Surgeon: Baruch Goldmann, MD;  Location: [facility];  Service: Ophthalmology;  Laterality: Left;  CDE: 7.10  . CATARACT EXTRACTION W/PHACO Right 02/27/2019   Procedure: CATARACT EXTRACTION PHACO AND INTRAOCULAR LENS PLACEMENT RIGHT EYE CDE=11.84;  Surgeon: Baruch Goldmann, MD;  Location: [facility];  Service: Ophthalmology;  Laterality: Right;  right  .  CYSTOSCOPY WITH HOLMIUM LASER LITHOTRIPSY  1990's  . EP IMPLANTABLE DEVICE N/A 05/21/2015   MDT Advisa DR MRI pacemaker implanted by Dr Curt Bears  . KNEE ARTHROSCOPY Right ~ 1991  . LAPAROSCOPIC CHOLECYSTECTOMY  ~ 2008  . TRANSURETHRAL RESECTION OF BLADDER TUMOR WITH GYRUS (TURBT-GYRUS)  1990's?   Social History   Socioeconomic History  . Marital status: Divorced    Spouse name: Not on file  . Number of children: Not on file  . Years of education: Not on file  . Highest education level: Not on file  Occupational History  . Occupation: Disabled    Employer: DISABLED  Tobacco Use  . Smoking status: Current Every Day Smoker    Packs/day: 0.50    Years: 50.00    Pack years: 25.00    Types: E-cigarettes    Start date: 02/26/1959  . Smokeless tobacco: Former Systems developer    Types: Chew  . Tobacco comment: 05/21/2015 "quit chewing in 1992"  Vaping Use  . Vaping Use: Every day  . Substances: Nicotine, Flavoring  Substance and Sexual Activity  . Alcohol use: Yes    Alcohol/week: 2.0 standard drinks    Types: 2 Cans of beer per week  . Drug use: Yes    Types: Cocaine    Comment: 05/21/2015 "quit in the early 2000"  . Sexual activity: Not on file  Other Topics Concern  . Not on file  Social History Narrative  Divorced   No regular exercise   Social Determinants of Health   Financial Resource Strain: Not on file  Food Insecurity: Not on file  Transportation Needs: Not on file  Physical Activity: Not on file  Stress: Not on file  Social Connections: Not on file   Outpatient Encounter Medications as of 11/26/2020  Medication Sig  . acetaminophen (TYLENOL) 500 MG tablet Take 500 mg by mouth every 6 (six) hours as needed (pain).   Marland Kitchen albuterol (PROVENTIL) (2.5 MG/3ML) 0.083% nebulizer solution Take 2.5 mg by nebulization every 6 (six) hours as needed for wheezing.  . Alcohol Swabs 70 % PADS Use as directed  . ALPRAZolam (XANAX) 0.5 MG tablet Take 0.5 mg by mouth 2 (two) times daily as  needed.  . BD PEN NEEDLE NANO U/F 32G X 4 MM MISC USE AS DIRECTED FOUR TIMES DAILY.  . carvedilol (COREG) 6.25 MG tablet TAKE ONE TABLET BY MOUTH TWICE DAILY. TAKE WITH A MEAL.  Marland Kitchen Cholecalciferol (VITAMIN D-3) 1000 UNITS CAPS Take 1,000 Units by mouth 2 (two) times daily.   . cholestyramine light (PREVALITE) 4 g packet SMARTSIG:1 Packet(s) By Mouth Twice Daily PRN  . citalopram (CELEXA) 40 MG tablet Take 20-40 mg by mouth daily.   . Continuous Blood Gluc Receiver (FREESTYLE LIBRE 14 DAY READER) DEVI USE AS DIRECTED.  Marland Kitchen Continuous Blood Gluc Sensor (FREESTYLE LIBRE 14 DAY SENSOR) MISC APPLY ONE SENSOR EVERY 14 DAYS.  Marland Kitchen ELIQUIS 5 MG TABS tablet TAKE ONE TABLET BY MOUTH TWICE DAILY  . ENTRESTO 24-26 MG TAKE ONE TABLET BY MOUTH TWICE DAILY  . FREESTYLE LITE test strip USE TO CHECK BLOOD SUGAR FOUR TIMES DAILY.  . furosemide (LASIX) 20 MG tablet Take 20 mg by mouth daily as needed.  . furosemide (LASIX) 40 MG tablet TAKE ONE TABLET (40MG ) BY MOUTH EVERY OTHER DAY ALTERNATING WITH TAKE ONE-HALF (1/2) TABLET (20MG )  . gabapentin (NEURONTIN) 400 MG capsule Take 400 mg by mouth 3 (three) times daily.   . IBU 800 MG tablet Take 800 mg by mouth every 6 (six) hours as needed.  . Insulin Aspart FlexPen 100 UNIT/ML SOPN INJECT 25-31 UNITS THREE TIMES DAILY AS DIRECTED WITH MEALS.  Marland Kitchen Insulin Pen Needle (B-D ULTRAFINE III SHORT PEN) 31G X 8 MM MISC Use to inject insulin 4 x daily  . ipratropium-albuterol (DUONEB) 0.5-2.5 (3) MG/3ML SOLN Take 3 mLs by nebulization every 4 (four) hours as needed (for shortness of breath).  . Lancets (FREESTYLE) lancets USE TO CHECK BLOOD SUGAR FOUR TIMES DAILY.  Marland Kitchen LANTUS SOLOSTAR 100 UNIT/ML Solostar Pen INJECT 100 UNITS INTO THE SKIN AT BEDTIME  . metoCLOPramide (REGLAN) 5 MG tablet Take 5 mg by mouth every 6 (six) hours as needed for nausea or vomiting.  . nitroGLYCERIN (NITROSTAT) 0.4 MG SL tablet Place 1 tablet (0.4 mg total) under the tongue every 5 (five) minutes as needed  for chest pain.  . NON FORMULARY 1 each by Other route See admin instructions. CPAP - Uses nightly  . simvastatin (ZOCOR) 40 MG tablet TAKE ONE TABLET BY MOUTH AT BEDTIME  . traMADol (ULTRAM) 50 MG tablet Take 1 tablet by mouth daily as needed (pain).   . TRELEGY ELLIPTA 100-62.5-25 MCG/INH AEPB Inhale 1 puff into the lungs daily.  Marland Kitchen triamcinolone cream (KENALOG) 0.1 % Apply 1 application topically 2 (two) times daily as needed.  . vitamin B-12 (CYANOCOBALAMIN) 1000 MCG tablet Take 1,000 mcg by mouth 2 (two) times daily.  . VOLTAREN 1 %  GEL Apply 2 g topically 4 (four) times daily as needed (for pain).    No facility-administered encounter medications on file as of 11/26/2020.    ALLERGIES: No Known Allergies  VACCINATION STATUS:  There is no immunization history on file for this patient.  Diabetes He presents for his follow-up diabetic visit. He has type 2 diabetes mellitus. Onset time: He was diagnosed at approximate age of 27 years. His disease course has been stable (His most recent A1c was 9.8% on 01/12/2017.). Hypoglycemia symptoms include nervousness/anxiousness and tremors. Pertinent negatives for hypoglycemia include no confusion, hunger, pallor or seizures. (Nausea, hypothermia) Associated symptoms include foot paresthesias. Pertinent negatives for diabetes include no fatigue, no polydipsia, no polyphagia, no polyuria and no weakness. There are no hypoglycemic complications. Symptoms are stable. Diabetic complications include heart disease, nephropathy and peripheral neuropathy. Risk factors for coronary artery disease include dyslipidemia, diabetes mellitus, hypertension, male sex, sedentary lifestyle, tobacco exposure, family history and obesity. Current diabetic treatment includes intensive insulin program. He is compliant with treatment most of the time. His weight is fluctuating minimally. He is following a generally unhealthy diet. When asked about meal planning, he reported none.  He has not had a previous visit with a dietitian. He never [facility] with cane due to bilateral knee arthritis) participates in exercise. His home blood glucose trend is fluctuating minimally. His overall blood glucose range is 140-180 mg/dl. (He presents today, accompanied by his significant other, with his CGM and logs showing stable glycemic profile overall.  His POCT A1c today is 7.2%, essentially unchanged from previous visit of 7.1%.  He does have some episodes of hypoglycemia before lunch, associated with meal timing.  Analysis of his CGM shows TIR 63%, TAR 33%, TBR 4%.) An ACE inhibitor/angiotensin II receptor blocker is being taken. He sees a podiatrist.Eye exam is current.  Hyperlipidemia This is a chronic problem. The current episode started more than 1 year ago. The problem is uncontrolled. Recent lipid tests were reviewed and are variable. Exacerbating diseases include chronic renal disease, diabetes and obesity. Factors aggravating his hyperlipidemia include smoking, fatty foods and beta blockers. Pertinent negatives include no myalgias or shortness of breath. Current antihyperlipidemic treatment includes statins. The current treatment provides mild improvement of lipids. Compliance problems include adherence to diet and adherence to exercise.  Risk factors for coronary artery disease include dyslipidemia, diabetes mellitus, hypertension, a sedentary lifestyle, male sex and obesity.  Hypertension This is a chronic problem. The current episode started more than 1 year ago. The problem has been resolved since onset. The problem is controlled. Pertinent negatives include no shortness of breath. There are no associated agents to hypertension. Risk factors for coronary artery disease include diabetes mellitus, dyslipidemia, family history, male gender, obesity, sedentary lifestyle and smoking/tobacco exposure. Past treatments include beta blockers and diuretics. The current treatment provides significant  improvement. Compliance problems include diet and exercise.  Hypertensive end-organ damage includes kidney disease and CAD/MI. Identifiable causes of hypertension include chronic renal disease.    Review of systems  Constitutional: + Minimally fluctuating body weight,  current  Body mass index is 37.13 kg/m. , + fatigue, no subjective hyperthermia, no subjective hypothermia Eyes: no blurry vision, no xerophthalmia ENT: no sore throat, no nodules palpated in throat, no dysphagia/odynophagia, no hoarseness Cardiovascular: no Chest Pain, no Shortness of Breath, no palpitations, no leg swelling Respiratory: chronic cough (smoker), intermittent shortness of breath Gastrointestinal: no Nausea/Vomiting/Diarrhea Musculoskeletal: bilateral knee pain (under evaluation for bilateral TKR) - using crutches Skin: no rashes,  no hyperemia, multiple nonhealing wounds to BLE Neurological: no tremors, no numbness, no tingling, + intermittent dizziness Psychiatric: no depression, no anxiety   Objective:    Ht 5\' 9"  (1.753 m)   BMI 37.13 kg/m   Wt Readings from Last 3 Encounters:  07/29/20 251 lb 6.4 oz (114 kg)  06/26/20 252 lb 3.2 oz (114.4 kg)  03/19/20 248 lb (112.5 kg)    BP Readings from Last 3 Encounters:  07/29/20 99/65  06/26/20 (!) 100/50  03/19/20 139/82    Physical Exam- Limited  Constitutional:  Body mass index is 37.07 kg/m. , not in acute distress, normal state of mind Eyes:  EOMI, no exophthalmos Neck: Supple Cardiovascular: RRR, no murmurs, rubs, or gallops, no edema Respiratory: SOB at rest with chronic congested cough, no crackles, rales, rhonchi, or wheezing Musculoskeletal: no gross deformities, strength intact in all four extremities, + walks with crutches due to bilateral knee arthritis Skin:  no rashes, no hyperemia Neurological: no tremor with outstretched hands    POCT ABI Results 11/26/20   Right ABI:  PAD      Left ABI:  PAD  Right leg systolic / diastolic:  PAD mmHg Left leg systolic / diastolic: PAD mmHg  Arm systolic / diastolic: 240/97 mmHG  Detailed report will be scanned into patient chart.   Lipid Panel     Component Value Date/Time   CHOL 157 06/25/2020 0000   TRIG 155 06/25/2020 0000   HDL 44 06/25/2020 0000   LDLCALC 86 06/25/2020 0000   No results found for this or any previous visit (from the past 2160 hour(s)). CMP Latest Ref Rng & Units 06/25/2020 03/15/2020 06/08/2019  Glucose 70 - 99 mg/dL - - -  BUN 4 - 21 25(A) 22(A) 20  Creatinine 0.6 - 1.3 1.6(A) 2.2(A) 1.6(A)  Sodium 137 - 147 - - 138  Potassium 3.4 - 5.3 - 5.0 5.2  Chloride 99 - 108 - - 102  CO2 13 - 22 - - 25(A)  Calcium 8.7 - 10.7 9.2 9.3 9.4  Total Protein 6.5 - 8.1 g/dL - - -  Total Bilirubin 0.3 - 1.2 mg/dL - - -  Alkaline Phos 25 - 125 - - 81  AST 14 - 40 - - 21  ALT 10 - 40 - - 38     Assessment & Plan:   1) Type 2 diabetes mellitus with stage 3 chronic kidney disease, without long-term current use of insulin (HCC)  - Patient has currently uncontrolled symptomatic type 2 DM since 75 years of age.  He presents today, accompanied by his significant other, with his CGM and logs showing stable glycemic profile overall.  His POCT A1c today is 7.2%, essentially unchanged from previous visit of 7.1%.  He does have some episodes of hypoglycemia before lunch, associated with meal timing.  Analysis of his CGM shows TIR 63%, TAR 33%, TBR 4%.  -his diabetes is complicated by stage 3 renal insufficiency (improving), obesity/sedentary life, neuropathy, cardiomyopathy, chronic heavy smoking, inadequate insurance and Dylan Tyler remains at a high risk for more acute and chronic complications which include CAD, CVA, CKD, retinopathy, and neuropathy. These are all discussed in detail with the patient.  - Nutritional counseling repeated at each appointment due to patients tendency to fall back in to old habits.  - The patient admits there is a room for improvement  in their diet and drink choices. -  Suggestion is made for the patient to avoid simple carbohydrates from their  diet including Cakes, Sweet Desserts / Pastries, Ice Cream, Soda (diet and regular), Sweet Tea, Candies, Chips, Cookies, Sweet Pastries, Store Bought Juices, Alcohol in Excess of 1-2 drinks a day, Artificial Sweeteners, Coffee Creamer, and "Sugar-free" Products. This will help patient to have stable blood glucose profile and potentially avoid unintended weight gain.   - I encouraged the patient to switch to unprocessed or minimally processed complex starch and increased protein intake (animal or plant source), fruits, and vegetables.   - Patient is advised to stick to a routine mealtimes to eat 3 meals a day and avoid unnecessary snacks (to snack only to correct hypoglycemia).  - I have approached him with the following individualized plan to manage diabetes and patient agrees:   - Based on his glucose profile, he will continue to need intensive treatment with higher dose of basal/bolus insulin in order for him to achieve control of diabetes to target.    -Given his stable, improved glycemic profile, he is to continue current medication regimen consisting of Lantus 100 units SQ daily at bedtime and continue Novolog 25-31 units TID with meals if blood glucose above 90 and he is eating.  -He is encouraged to continue using his CGM to monitor blood glucose 4 times per day, before meals and at bedtime and report to the clinic if blood glucose levels are less than 70 or greater than 200 for 3 tests in a row.  - He is not a candidate for metformin therapy due to CKD.  - He is not a candidate for incretin therapy (such as Ozempic) due to increased risk of pancreatitis r/t hypertriglyceridemia and heavy smoking history.  2) Lipids/HPL:  His recent lipid panel from 06/25/20 shows controlled LDL of 86 and improved triglycerides of 155.  He is advised to continue Cholestyramine 4 g TID with meals  and Simvastatin 40 mg po daily at bedtime.  Side effects and precautions discussed with him.  3) Hypertension His blood pressure is controlled to target for his age.  He is advised to continue Coreg 6.25 mg po twice daily, Lasix 40 mg po every other day (alternating with 20 mg), and Entresto 24-26 mg po twice daily.   4) Chronic Care/Health Maintenance: -he is on ACEI/ARB and Statin medications and  is encouraged to continue to follow up with Ophthalmology, Dentist,  Podiatrist at least yearly or according to recommendations. - I have recommended yearly flu vaccine and pneumonia vaccination at least every 5 years; and  sleep for at least 7 hours a day.  The patient was counseled on the dangers of tobacco use, and was advised to quit.  Reviewed strategies to maximize success, including removing cigarettes and smoking materials from environment.  5) Weight management:  His Body mass index is 37.13 kg/m. He is a candidate for modest weight loss.  Exercise regimen and carbs restrictions were detailed with him.   - I advised patient to maintain close follow up with Jerene Bears B, MD for primary care needs.  -Regarding his abnormal ABI indicating PAD, he will be referred to vascular specialist for further evaluation and treatment.  I spent 44 minutes in the care of the patient today including review of labs from [facility], Lipids, Thyroid Function, Hematology (current and previous including abstractions from other facilities); face-to-face time discussing  his blood glucose readings/logs, discussing hypoglycemia and hyperglycemia episodes and symptoms, medications doses, his options of short and long term treatment based on the latest standards of care / guidelines;  discussion about  incorporating lifestyle medicine;  and documenting the encounter.    Please refer to Patient Instructions for Blood Glucose Monitoring and Insulin/Medications Dosing Guide"  in media tab for additional information. Please  also  refer to " Patient Self Inventory" in the Media  tab for reviewed elements of pertinent patient history.  [name] participated in the discussions, expressed understanding, and voiced agreement with the above plans.  All questions were answered to his satisfaction. he is encouraged to contact clinic should he have any questions or concerns prior to his return visit.   Follow up plan: - Return in about 4 months (around 03/29/2021) for Diabetes F/U- A1c and UM in office, Bring meter and logs, No previsit labs.    Rayetta Pigg, [facility] [address] [place], [place] [address] Phone: [phone] Fax: [phone]   11/26/2020, 2:36 PM

## 2020-11-26 NOTE — Patient Instructions (Signed)

## 2020-12-04 ENCOUNTER — Ambulatory Visit (INDEPENDENT_AMBULATORY_CARE_PROVIDER_SITE_OTHER): Payer: PPO

## 2020-12-04 DIAGNOSIS — I428 Other cardiomyopathies: Secondary | ICD-10-CM | POA: Diagnosis not present

## 2020-12-04 DIAGNOSIS — R001 Bradycardia, unspecified: Secondary | ICD-10-CM

## 2020-12-08 LAB — CUP PACEART REMOTE DEVICE CHECK
Battery Remaining Longevity: 21 mo
Battery Voltage: 2.94 V
Brady Statistic RA Percent Paced: 0 %
Brady Statistic RV Percent Paced: 94.77 %
Date Time Interrogation Session: 20220604191151
Implantable Lead Implant Date: 20161115
Implantable Lead Implant Date: 20161115
Implantable Lead Location: 753859
Implantable Lead Location: 753860
Implantable Lead Model: 5076
Implantable Lead Model: 5076
Implantable Pulse Generator Implant Date: 20161115
Lead Channel Impedance Value: 361 Ohm
Lead Channel Impedance Value: 361 Ohm
Lead Channel Impedance Value: 437 Ohm
Lead Channel Impedance Value: 475 Ohm
Lead Channel Pacing Threshold Amplitude: 0.5 V
Lead Channel Pacing Threshold Amplitude: 0.875 V
Lead Channel Pacing Threshold Pulse Width: 0.4 ms
Lead Channel Pacing Threshold Pulse Width: 0.4 ms
Lead Channel Sensing Intrinsic Amplitude: 2.5 mV
Lead Channel Sensing Intrinsic Amplitude: 2.5 mV
Lead Channel Sensing Intrinsic Amplitude: 9.125 mV
Lead Channel Sensing Intrinsic Amplitude: 9.125 mV
Lead Channel Setting Pacing Amplitude: 2.5 V
Lead Channel Setting Pacing Pulse Width: 0.4 ms
Lead Channel Setting Sensing Sensitivity: 2.8 mV

## 2020-12-13 ENCOUNTER — Other Ambulatory Visit: Payer: Self-pay | Admitting: *Deleted

## 2020-12-13 DIAGNOSIS — M79606 Pain in leg, unspecified: Secondary | ICD-10-CM

## 2020-12-16 ENCOUNTER — Other Ambulatory Visit: Payer: Self-pay | Admitting: Nurse Practitioner

## 2020-12-16 ENCOUNTER — Other Ambulatory Visit: Payer: Self-pay | Admitting: Cardiology

## 2020-12-27 NOTE — Progress Notes (Signed)
Remote pacemaker transmission.   

## 2020-12-31 ENCOUNTER — Other Ambulatory Visit: Payer: Self-pay | Admitting: Nurse Practitioner

## 2021-01-02 NOTE — Progress Notes (Signed)
Cardiology Office Note  Date: 01/03/2021   ID: Dylan Tyler [birth year], MRN [id number]  PCP:  Glenda Chroman, MD  Cardiologist:  Rozann Lesches, MD Electrophysiologist:  Thompson Grayer, MD   Chief Complaint: Follow-up permanent atrial fibrillation, secondary cardiomyopathy, cardiac pacemaker, stage III chronic kidney disease.  History of Present Illness: Dylan Tyler is a 75 y.o. male with a history of permanent atrial fibrillation, secondary cardiomyopathy, cardiac pacemaker, stage III chronic kidney disease, aortic regurgitation, COPD, DM 2, HTN, GERD, OSA on CPAP, current smoker  Last encounter with Dr. Domenic Polite on 12/08/2019 for routine visit.  Did not report any palpitations, chest pain.  Having some issues with chronic back and knee pain.  Was seeing Dr. [name] for device management for his pacemaker.  Follow-up echo showed improvement in EF 40 to 45% with mild AR.  Plan was to continue Coreg, Entresto, Aldactone, Lasix and check a follow-up basic metabolic panel.  Patient was asymptomatic with his atrial fibrillation/flutter.  He was continuing on Eliquis for stroke prophylaxis.  Follow-up CBC was ordered.  Last creatinine was 1.6.  He was last here for 30-month follow-up.  He recently had an upper respiratory infection/virus but it was not Covid.  Still had some lingering coughing.  He continued to smoke.  He denied any anginal symptoms but does have some chronic dyspnea more than likely due to long history of smoking and was continuing to smoke.  Blood pressure was 100/50.  He denies any CVA or TIA-like symptoms.  Stated he had occasional transient dizziness but no presyncopal or syncopal episodes.  Denies any PND, orthopnea.  He had not been using his CPAP.  Denied any claudication-like symptoms.  However he does have chronic arthritis pain in both knees and uses a cane to help assist with ambulation.  Denies any DVT or PE-like symptoms, or lower extremity edema.  His blood sugar had  not been well controlled recently.  His recent hemoglobin A1c was around 7.1% down from 7.6%.  Recent device check by Dr. [name]: Normal device function. Battery status, leads stable. Histograms reviewed and appropriate.   He is here for 6 months follow-up today.  He continues to complain of shortness of breath.  He has a long history of smoking and continues to smoke.  He states he does not feel like the fluid medication is helping much.  He continues with chronic lower extremity edema.  He states he has been taking the Lasix as directed.  He takes Lasix 40 mg every other day alternating with 20 mg.  He also has as needed 20 mg Lasix.  He continues on Entresto 24/26 mg p.o. twice daily.  States his diabetes is up and down.  He has started seeing endocrinology in [place].  Had a recent renal function labs which had improved slightly.  Previous creatinine in September 2021 was 2.2.  In December 2021 creatinine was 1.6..  Creatinine on 03/15/2020 at [facility] was 2.15 with GFR of 29.  Most recent renal function on 11/22/2020 at [facility] creatinine 1.84 and GFR 35.  EKG today shows ventricular paced rhythm rate of 72 with underlying atrial fibrillation.  Recent hemoglobin A1c was 7.2%.  His weight today is 259.  His weight on 11/26/2020 was 251.  He states he is taking his Entresto and diuretic therapy as ordered.  He has not very active due to debilitation.  He continues with chronic lower extremity edema.  He states he feels his shortness of breath has  worsened since last being seen.   Past Medical History:  Diagnosis Date   Ankylosing spondylitis (HCC)    Anxiety    Aortic root dilatation (HCC)    AR (aortic regurgitation)    Atrial fibrillation and flutter (HCC)    Bladder cancer (HCC)    Chronic pain    [facility] at [facility]   COPD (chronic obstructive pulmonary disease) (HCC)    DDD (degenerative disc disease), lumbosacral    DDD (degenerative disc disease), thoracolumbar    Degenerative cervical disc     Depression    Diabetes mellitus type II    Diverticulitis    Essential hypertension    GERD (gastroesophageal reflux disease)    History of kidney stones    Hyperlipidemia    Migraine    Nonischemic cardiomyopathy ([place])    Cardiolite 12/09 LVEF 55%, minor luminal irregularities at cardiac catheterization   OSA on CPAP    Rheumatoid arthritis (HCC)    Symptomatic bradycardia    Medtronic Advisa L dual-chamber pacemaker  05/2015 - Dr. Curt Bears    Past Surgical History:  Procedure Laterality Date   CARDIAC CATHETERIZATION  08/13/11   CARDIOVERSION N/A 07/02/2015   Procedure: CARDIOVERSION;  Surgeon: Sanda Klein, MD;  Location: [facility];  Service: Cardiovascular;  Laterality: N/A;   CATARACT EXTRACTION W/PHACO Left 02/10/2019   Procedure: CATARACT EXTRACTION PHACO AND INTRAOCULAR LENS PLACEMENT ([place]);  Surgeon: Baruch Goldmann, MD;  Location: [facility];  Service: Ophthalmology;  Laterality: Left;  CDE: 7.10   CATARACT EXTRACTION W/PHACO Right 02/27/2019   Procedure: CATARACT EXTRACTION PHACO AND INTRAOCULAR LENS PLACEMENT RIGHT EYE CDE=11.84;  Surgeon: Baruch Goldmann, MD;  Location: [facility];  Service: Ophthalmology;  Laterality: Right;  right   CYSTOSCOPY WITH HOLMIUM LASER LITHOTRIPSY  1990's   EP IMPLANTABLE DEVICE N/A 05/21/2015   MDT Advisa DR MRI pacemaker implanted by Dr Curt Bears   KNEE ARTHROSCOPY Right ~ [place]  ~ 2008   TRANSURETHRAL RESECTION OF BLADDER TUMOR WITH GYRUS (TURBT-GYRUS)  1990's?    Current Outpatient Medications  Medication Sig Dispense Refill   acetaminophen (TYLENOL) 500 MG tablet Take 500 mg by mouth every 6 (six) hours as needed (pain).      albuterol (PROVENTIL) (2.5 MG/3ML) 0.083% nebulizer solution Take 2.5 mg by nebulization every 6 (six) hours as needed for wheezing.     Alcohol Swabs 70 % PADS Use as directed 120 each 2   ALPRAZolam (XANAX) 0.5 MG tablet Take 0.5 mg by mouth 2 (two) times daily as needed.  1   BD PEN NEEDLE  NANO U/F 32G X 4 MM MISC USE AS DIRECTED FOUR TIMES DAILY 200 each 5   carvedilol (COREG) 6.25 MG tablet TAKE ONE TABLET BY MOUTH TWICE DAILY. TAKE WITH A MEAL. 180 tablet 2   Cholecalciferol (VITAMIN D-3) 1000 UNITS CAPS Take 1,000 Units by mouth 2 (two) times daily.      cholestyramine light (PREVALITE) 4 g packet SMARTSIG:1 Packet(s) By Mouth Twice Daily PRN     citalopram (CELEXA) 40 MG tablet Take 20-40 mg by mouth daily.      Continuous Blood Gluc Receiver (FREESTYLE LIBRE 14 DAY READER) DEVI USE AS DIRECTED. 1 each 0   Continuous Blood Gluc Sensor (FREESTYLE LIBRE 14 DAY SENSOR) MISC APPLY ONE SENSOR EVERY 14 DAYS 2 each 2   ELIQUIS 5 MG TABS tablet TAKE ONE TABLET BY MOUTH TWICE DAILY 60 tablet 6   ENTRESTO 24-26 MG TAKE ONE TABLET BY MOUTH TWICE  DAILY 60 tablet 6   furosemide (LASIX) 20 MG tablet Take 20 mg by mouth daily as needed.     furosemide (LASIX) 40 MG tablet TAKE ONE TABLET (40MG ) BY MOUTH EVERY OTHER DAY ALTERNATING WITH TAKE ONE-HALF (1/2) TABLET (20MG ) 45 tablet 2   gabapentin (NEURONTIN) 400 MG capsule Take 400 mg by mouth 3 (three) times daily.      IBU 800 MG tablet Take 800 mg by mouth every 6 (six) hours as needed.     Insulin Aspart FlexPen 100 UNIT/ML SOPN INJECT 25-31 UNITS THREE TIMES DAILY AS DIRECTED WITH MEALS. 90 mL 0   Insulin Pen Needle (B-D ULTRAFINE III SHORT PEN) 31G X 8 MM MISC Use to inject insulin 4 x daily 200 each 3   ipratropium-albuterol (DUONEB) 0.5-2.5 (3) MG/3ML SOLN Take 3 mLs by nebulization every 4 (four) hours as needed (for shortness of breath).     Lancets (FREESTYLE) lancets USE TO CHECK BLOOD SUGAR FOUR TIMES DAILY. 200 each 5   LANTUS SOLOSTAR 100 UNIT/ML Solostar Pen INJECT 100 UNITS INTO THE SKIN AT BEDTIME 30 mL 3   metoCLOPramide (REGLAN) 5 MG tablet Take 5 mg by mouth every 6 (six) hours as needed for nausea or vomiting.     nitroGLYCERIN (NITROSTAT) 0.4 MG SL tablet Place 1 tablet (0.4 mg total) under the tongue every 5 (five)  minutes as needed for chest pain (if no relief after 2nd dose, proceed to the ED for an evaluation or call 911). 25 tablet 3   NON FORMULARY 1 each by Other route See admin instructions. CPAP - Uses nightly     simvastatin (ZOCOR) 40 MG tablet TAKE ONE TABLET BY MOUTH AT BEDTIME 90 tablet 3   traMADol (ULTRAM) 50 MG tablet Take 1 tablet by mouth daily as needed (pain).      TRELEGY ELLIPTA 100-62.5-25 MCG/INH AEPB Inhale 1 puff into the lungs daily.     triamcinolone cream (KENALOG) 0.1 % Apply 1 application topically 2 (two) times daily as needed.     vitamin B-12 (CYANOCOBALAMIN) 1000 MCG tablet Take 1,000 mcg by mouth 2 (two) times daily.     VOLTAREN 1 % GEL Apply 2 g topically 4 (four) times daily as needed (for pain).      No current facility-administered medications for this visit.   Allergies:  Patient has no known allergies.   Social History: The patient  reports that he has been smoking e-cigarettes and cigarettes. He started smoking about 61 years ago. He has a 25.00 pack-year smoking history. He has quit using smokeless tobacco.  His smokeless tobacco use included chew. He reports current alcohol use of about 2.0 standard drinks of alcohol per week. He reports current drug use. Drug: Cocaine.   Family History: The patient's family history includes Cancer in his father; Heart attack in his mother.   ROS:  Please see the history of present illness. Otherwise, complete review of systems is positive for none.  All other systems are reviewed and negative.   Physical Exam: VS:  BP 102/60   Pulse 72   Ht 5\' 9"  (1.753 m)   Wt 259 lb (117.5 kg)   SpO2 98%   BMI 38.25 kg/m , BMI Body mass index is 38.25 kg/m.  Wt Readings from Last 3 Encounters:  01/03/21 259 lb (117.5 kg)  11/26/20 251 lb (113.9 kg)  07/29/20 251 lb 6.4 oz (114 kg)    General: Obese patient appears comfortable at rest. Neck: Supple,  no elevated JVP or carotid bruits, no thyromegaly. Lungs: Clear to  auscultation, nonlabored breathing at rest. Cardiac: Regular rate and rhythm, no S3 or significant systolic murmur, no pericardial rub. Extremities: 1+ pitting edema right greater than left, distal pulses 2+. Skin: Warm and dry. Musculoskeletal: No kyphosis. Neuropsychiatric: Alert and oriented x3, affect grossly appropriate.  ECG: 01/03/2021 ventricular paced rhythm with occasional PVCs rate of 72.  Recent Labwork: 03/15/2020: Potassium 5.0 06/25/2020: BUN 25; Creatinine 1.6; TSH 1.89     Component Value Date/Time   CHOL 157 06/25/2020 0000   TRIG 155 06/25/2020 0000   HDL 44 06/25/2020 0000   LDLCALC 86 06/25/2020 0000    Other Studies Reviewed Today:  Remote device check 12/08/2020 Scheduled remote reviewed. Normal device function.    Known permanent AF, on [place], good ventricular rate control.  The two fast A&V events appear to possibly be NSVT, sent to triage.  Next remote 91 days.    Echocardiogram 12/06/2019: 1. Left ventricular ejection fraction, by estimation, is 40 to 45%. The  left ventricle has mildly decreased function. The left ventricle  demonstrates global hypokinesis. There is mild left ventricular  hypertrophy. Left ventricular diastolic parameters  are indeterminate.   2. Right ventricular systolic function is low normal. The right  ventricular size is normal. Mildly increased right ventricular wall  thickness. There is normal pulmonary artery systolic pressure. The  estimated right ventricular systolic pressure is 37.6  mmHg.   3. Left atrial size was upper normal.   4. The mitral valve is grossly normal. Trivial mitral valve  regurgitation.   5. The aortic valve is tricuspid. Aortic valve regurgitation is mild.  Mild aortic valve sclerosis is present, with no evidence of aortic valve  stenosis.   6. The inferior vena cava is normal in size with greater than 50%  respiratory variability, suggesting right atrial pressure of 3 mmHg.   Echocardiogram  04/13/2019 1. Left ventricular ejection fraction, by visual estimation, is 35 to 40%. The left ventricle has moderately decreased function. Normal left ventricular size. There is moderately increased left ventricular hypertrophy. 2. Left ventricular diastolic Doppler parameters are indeterminate pattern of LV diastolic filling. 3. Global right ventricle has normal systolic function.The right ventricular size is mildly enlarged. No increase in right ventricular wall thickness. 4. Left atrial size was mildly dilated. 5. Right atrial size was mildly dilated. 6. Mild aortic valve annular calcification. 7. Mild mitral annular calcification. 8. The mitral valve is degenerative. Mild mitral valve regurgitation. 9. The tricuspid valve is grossly normal. Tricuspid valve regurgitation is mild. 10. The aortic valve is tricuspid Aortic valve regurgitation is mild to moderate by color flow Doppler. Mild aortic valve sclerosis without stenosis. 11. The pulmonic valve was not well visualized. Pulmonic valve regurgitation is not visualized by color flow Doppler. 12. Mildly elevated pulmonary artery systolic pressure. 13. A pacer wire is visualized in the RA and RV. 14. The inferior vena cava is normal in size with greater than 50% respiratory variability, suggesting right atrial pressure of 3 mmHg.    Assessment and Plan:  1. Nonischemic cardiomyopathy ([place])   2. Permanent atrial fibrillation (HCC)   3. Stage 3b chronic kidney disease ([place])   4. Mixed hyperlipidemia   5. SOB (shortness of breath)   6. Lower extremity edema   7. Tobacco abuse     1. Nonischemic cardiomyopathy (HCC) Echocardiogram performed on 12/06/2019 showed improvement EF at 40 to 45% compared to previous echo in 04/13/2019 of EF 35  to 40%. LVH global hypokinesis, mild LVH mild AR trivial MR.  He has chronic dyspnea most likely attributed to COPD.  Continue alternating Lasix 40 mg q. OD with 20 mg q. OD and 20 mg as needed.   Continue Entresto 24/26 mg p.o. twice daily.  Get a follow-up echocardiogram to reassess his LV function, diastolic function and valvular function.  2. Permanent atrial fibrillation ([place]) Last EKG 12/08/2019 showed ventricular paced rhythm with underlying atrial fibrillation/flutter rate of 72.  Continue Eliquis 5 mg p.o. twice daily.  He denies any palpitations or arrhythmias.  EKG today shows ventricular paced rhythm with a rate of 72.  Last device check with Dr. [name] was normal.  No bleeding on Eliquis.  3. Stage 3b chronic kidney disease ([place]) Recent renal function labs on 11/22/2020 from [place] demonstrated BUN of 24 and creatinine of 1.84.  GFR was 35.  Please refer to nephrology Dr. Theador Hawthorne secondary to stage III renal disease and management of diuretic therapy.  Likely has some diabetic nephropathy.  Diabetes has not been well controlled.  He recently started seeing Dr. Dorris Fetch in [place] for his diabetes.  4.  Hyperlipidemia Continue simvastatin 40 mg p.o. daily..Last lipid panel collected on 03/15/2020: TG 229, TC 125, HDL 30, LDL 49.  5.  Shortness of breath. Continues with shortness of breath.  Likely a result of morbid obesity, cardiomyopathy, obesity hypoventilation syndrome, smoking, we are getting a follow-up echocardiogram to reassess LV function and valvular function.  Diastolic systolic function,  6.  Lower extremity edema Continues with 1+ pitting edema right greater than left.  He states he is taking his Lasix as directed.  Also continuing Entresto as directed.  We are obtaining a follow-up echocardiogram to reassess LV function, diastolic function, valvular function.  7. Smoking He continues to smoke.  Highly advised cessation.  He states he has smoked for significantly long time.  He states he briefly vape for a while then return to cigarette smoking.   Medication Adjustments/Labs and Tests Ordered: Current medicines are reviewed at length with the patient today.   Concerns regarding medicines are outlined above.   Disposition: Follow-up with Dr. Domenic Polite or APP 6 to 8 weeks  Signed, Levell July, NP 01/03/2021 12:24 PM    [facility] [place], [place], [place] [address] Phone: [phone]; Fax: [phone]

## 2021-01-03 ENCOUNTER — Ambulatory Visit: Payer: PPO | Admitting: Family Medicine

## 2021-01-03 ENCOUNTER — Encounter: Payer: Self-pay | Admitting: Family Medicine

## 2021-01-03 VITALS — BP 102/60 | HR 72 | Ht 69.0 in | Wt 259.0 lb

## 2021-01-03 DIAGNOSIS — E782 Mixed hyperlipidemia: Secondary | ICD-10-CM

## 2021-01-03 DIAGNOSIS — R600 Localized edema: Secondary | ICD-10-CM

## 2021-01-03 DIAGNOSIS — I4821 Permanent atrial fibrillation: Secondary | ICD-10-CM

## 2021-01-03 DIAGNOSIS — I428 Other cardiomyopathies: Secondary | ICD-10-CM | POA: Diagnosis not present

## 2021-01-03 DIAGNOSIS — Z720 Tobacco use: Secondary | ICD-10-CM | POA: Diagnosis not present

## 2021-01-03 DIAGNOSIS — N1832 Chronic kidney disease, stage 3b: Secondary | ICD-10-CM

## 2021-01-03 DIAGNOSIS — R0602 Shortness of breath: Secondary | ICD-10-CM | POA: Diagnosis not present

## 2021-01-03 NOTE — Patient Instructions (Signed)
Medication Instructions:  Your physician recommends that you continue on your current medications as directed. Please refer to the Current Medication list given to you today.  Labwork: none  Testing/Procedures: Your physician has requested that you have an echocardiogram. Echocardiography is a painless test that uses sound waves to create images of your heart. It provides your doctor with information about the size and shape of your heart and how well your heart's chambers and valves are working. This procedure takes approximately one hour. There are no restrictions for this procedure.  Follow-Up: Your physician recommends that you schedule a follow-up appointment in: 6-8 weeks You have been referred to Dr. Theador Hawthorne (nephrologist)  Any Other Special Instructions Will Be Listed Below (If Applicable).  If you need a refill on your cardiac medications before your next appointment, please call your pharmacy.

## 2021-01-10 DIAGNOSIS — M25562 Pain in left knee: Secondary | ICD-10-CM | POA: Diagnosis not present

## 2021-01-10 DIAGNOSIS — Z299 Encounter for prophylactic measures, unspecified: Secondary | ICD-10-CM | POA: Diagnosis not present

## 2021-01-10 DIAGNOSIS — I10 Essential (primary) hypertension: Secondary | ICD-10-CM | POA: Diagnosis not present

## 2021-01-10 DIAGNOSIS — F17210 Nicotine dependence, cigarettes, uncomplicated: Secondary | ICD-10-CM | POA: Diagnosis not present

## 2021-01-10 DIAGNOSIS — E1165 Type 2 diabetes mellitus with hyperglycemia: Secondary | ICD-10-CM | POA: Diagnosis not present

## 2021-01-10 DIAGNOSIS — M25561 Pain in right knee: Secondary | ICD-10-CM | POA: Diagnosis not present

## 2021-01-14 ENCOUNTER — Other Ambulatory Visit: Payer: Self-pay | Admitting: Cardiology

## 2021-01-14 DIAGNOSIS — I130 Hypertensive heart and chronic kidney disease with heart failure and stage 1 through stage 4 chronic kidney disease, or unspecified chronic kidney disease: Secondary | ICD-10-CM | POA: Diagnosis not present

## 2021-01-14 DIAGNOSIS — I2510 Atherosclerotic heart disease of native coronary artery without angina pectoris: Secondary | ICD-10-CM | POA: Diagnosis not present

## 2021-01-14 DIAGNOSIS — F13180 Sedative, hypnotic or anxiolytic abuse with sedative, hypnotic or anxiolytic-induced anxiety disorder: Secondary | ICD-10-CM | POA: Diagnosis not present

## 2021-01-14 DIAGNOSIS — J449 Chronic obstructive pulmonary disease, unspecified: Secondary | ICD-10-CM | POA: Diagnosis not present

## 2021-01-14 DIAGNOSIS — F17290 Nicotine dependence, other tobacco product, uncomplicated: Secondary | ICD-10-CM | POA: Diagnosis not present

## 2021-01-14 DIAGNOSIS — I5042 Chronic combined systolic (congestive) and diastolic (congestive) heart failure: Secondary | ICD-10-CM | POA: Diagnosis not present

## 2021-01-14 DIAGNOSIS — I495 Sick sinus syndrome: Secondary | ICD-10-CM | POA: Diagnosis not present

## 2021-01-14 DIAGNOSIS — D692 Other nonthrombocytopenic purpura: Secondary | ICD-10-CM | POA: Diagnosis not present

## 2021-01-14 DIAGNOSIS — G629 Polyneuropathy, unspecified: Secondary | ICD-10-CM | POA: Diagnosis not present

## 2021-01-14 DIAGNOSIS — E785 Hyperlipidemia, unspecified: Secondary | ICD-10-CM | POA: Diagnosis not present

## 2021-01-14 DIAGNOSIS — I739 Peripheral vascular disease, unspecified: Secondary | ICD-10-CM | POA: Diagnosis not present

## 2021-01-15 ENCOUNTER — Ambulatory Visit: Payer: PPO | Admitting: Vascular Surgery

## 2021-01-15 ENCOUNTER — Encounter: Payer: Self-pay | Admitting: Vascular Surgery

## 2021-01-15 ENCOUNTER — Ambulatory Visit (INDEPENDENT_AMBULATORY_CARE_PROVIDER_SITE_OTHER): Payer: PPO

## 2021-01-15 ENCOUNTER — Other Ambulatory Visit: Payer: Self-pay

## 2021-01-15 VITALS — BP 115/75 | HR 78 | Temp 98.1°F | Ht 69.0 in | Wt 259.0 lb

## 2021-01-15 DIAGNOSIS — M79606 Pain in leg, unspecified: Secondary | ICD-10-CM

## 2021-01-15 NOTE — Progress Notes (Signed)
[facility] [place]  Patient name: Dylan Tyler MRN: [id number] DOB: [birth year] Sex: male  REASON FOR CONSULT: Patient with lower extremity pain and abnormal screening lower extremity arterial study  HPI: Dylan Tyler is a 75 y.o. male, who is here today for discussion of abnormal screening study from lower extremity arterial flow.  He has multiple complaints regarding his lower extremities.  He does have significant amount of lower extremity swelling.  Also has severe arthritic difficulty in his knees and had a recent gel injection reporting no significant improvement with this.  He does not have any history of lower extremity tissue loss or claudication type symptoms.  He is a lifelong cigarette smoker.  Past Medical History:  Diagnosis Date   Ankylosing spondylitis (HCC)    Anxiety    Aortic root dilatation (HCC)    AR (aortic regurgitation)    Atrial fibrillation and flutter (HCC)    Bladder cancer (HCC)    Chronic pain    Followed at [facility]   COPD (chronic obstructive pulmonary disease) (HCC)    DDD (degenerative disc disease), lumbosacral    DDD (degenerative disc disease), thoracolumbar    Degenerative cervical disc    Depression    Diabetes mellitus type II    Diverticulitis    Essential hypertension    GERD (gastroesophageal reflux disease)    History of kidney stones    Hyperlipidemia    Migraine    Nonischemic cardiomyopathy ([place])    Cardiolite 12/09 LVEF 55%, minor luminal irregularities at cardiac catheterization   OSA on CPAP    Rheumatoid arthritis (HCC)    Symptomatic bradycardia    Medtronic Advisa L dual-chamber pacemaker  05/2015 - Dr. Curt Bears    Family History  Problem Relation Age of Onset   Heart attack Mother    Cancer Father     SOCIAL HISTORY: Social History   Socioeconomic History   Marital status: Divorced    Spouse name: Not on file   Number of children: Not on file   Years of  education: Not on file   Highest education level: Not on file  Occupational History   Occupation: Disabled    Employer: DISABLED  Tobacco Use   Smoking status: Every Day    Packs/day: 0.50    Years: 50.00    Pack years: 25.00    Types: E-cigarettes, Cigarettes    Start date: 02/26/1959   Smokeless tobacco: Former    Types: Chew   Tobacco comments:    05/21/2015 "quit chewing in 1992"  Vaping Use   Vaping Use: Every day   Substances: Nicotine, Flavoring  Substance and Sexual Activity   Alcohol use: Yes    Alcohol/week: 2.0 standard drinks    Types: 2 Cans of beer per week   Drug use: Yes    Types: Cocaine    Comment: 05/21/2015 "quit in the [place] 2000"   Sexual activity: Not on file  Other Topics Concern   Not on file  Social History Narrative   Divorced   No regular exercise   Social Determinants of Health   Financial Resource Strain: Not on file  Food Insecurity: Not on file  Transportation Needs: Not on file  Physical Activity: Not on file  Stress: Not on file  Social Connections: Not on file  Intimate Partner Violence: Not on file    No Known Allergies  Current Outpatient Medications  Medication Sig Dispense Refill   acetaminophen (TYLENOL) 500 MG  tablet Take 500 mg by mouth every 6 (six) hours as needed (pain).      albuterol (PROVENTIL) (2.5 MG/3ML) 0.083% nebulizer solution Take 2.5 mg by nebulization every 6 (six) hours as needed for wheezing.     Alcohol Swabs 70 % PADS Use as directed 120 each 2   ALPRAZolam (XANAX) 0.5 MG tablet Take 0.5 mg by mouth 2 (two) times daily as needed.  1   BD PEN NEEDLE NANO U/F 32G X 4 MM MISC USE AS DIRECTED FOUR TIMES DAILY 200 each 5   carvedilol (COREG) 6.25 MG tablet TAKE ONE TABLET BY MOUTH TWICE DAILY. TAKE WITH A MEAL. 180 tablet 2   Cholecalciferol (VITAMIN D-3) 1000 UNITS CAPS Take 1,000 Units by mouth 2 (two) times daily.      cholestyramine light (PREVALITE) 4 g packet SMARTSIG:1 Packet(s) By Mouth Twice Daily  PRN     citalopram (CELEXA) 40 MG tablet Take 20-40 mg by mouth daily.      Continuous Blood Gluc Receiver (FREESTYLE LIBRE 14 DAY READER) DEVI USE AS DIRECTED. 1 each 0   Continuous Blood Gluc Sensor (FREESTYLE LIBRE 14 DAY SENSOR) MISC APPLY ONE SENSOR EVERY 14 DAYS 2 each 2   ELIQUIS 5 MG TABS tablet TAKE ONE TABLET BY MOUTH TWICE DAILY 60 tablet 6   ENTRESTO 24-26 MG TAKE ONE TABLET BY MOUTH TWICE DAILY 60 tablet 6   furosemide (LASIX) 20 MG tablet Take 20 mg by mouth daily as needed.     furosemide (LASIX) 40 MG tablet TAKE ONE TABLET (40MG ) BY MOUTH EVERY OTHER DAY ALTERNATING WITH TAKE ONE-HALF (1/2) TABLET (20MG ) 45 tablet 2   gabapentin (NEURONTIN) 400 MG capsule Take 400 mg by mouth 3 (three) times daily.      IBU 800 MG tablet Take 800 mg by mouth every 6 (six) hours as needed.     Insulin Aspart FlexPen 100 UNIT/ML SOPN INJECT 25-31 UNITS THREE TIMES DAILY AS DIRECTED WITH MEALS. 90 mL 0   Insulin Pen Needle (B-D ULTRAFINE III SHORT PEN) 31G X 8 MM MISC Use to inject insulin 4 x daily 200 each 3   ipratropium-albuterol (DUONEB) 0.5-2.5 (3) MG/3ML SOLN Take 3 mLs by nebulization every 4 (four) hours as needed (for shortness of breath).     Lancets (FREESTYLE) lancets USE TO CHECK BLOOD SUGAR FOUR TIMES DAILY. 200 each 5   LANTUS SOLOSTAR 100 UNIT/ML Solostar Pen INJECT 100 UNITS INTO THE SKIN AT BEDTIME 30 mL 3   metoCLOPramide (REGLAN) 5 MG tablet Take 5 mg by mouth every 6 (six) hours as needed for nausea or vomiting.     nitroGLYCERIN (NITROSTAT) 0.4 MG SL tablet Place 1 tablet (0.4 mg total) under the tongue every 5 (five) minutes as needed for chest pain (if no relief after 2nd dose, proceed to the ED for an evaluation or call 911). 25 tablet 3   NON FORMULARY 1 each by Other route See admin instructions. CPAP - Uses nightly     simvastatin (ZOCOR) 40 MG tablet TAKE ONE TABLET BY MOUTH AT BEDTIME 90 tablet 3   traMADol (ULTRAM) 50 MG tablet Take 1 tablet by mouth daily as needed  (pain).      TRELEGY ELLIPTA 100-62.5-25 MCG/INH AEPB Inhale 1 puff into the lungs daily.     triamcinolone cream (KENALOG) 0.1 % Apply 1 application topically 2 (two) times daily as needed.     vitamin B-12 (CYANOCOBALAMIN) 1000 MCG tablet Take 1,000 mcg by mouth 2 (  two) times daily.     VOLTAREN 1 % GEL Apply 2 g topically 4 (four) times daily as needed (for pain).      No current facility-administered medications for this visit.    REVIEW OF SYSTEMS:  [X]  denotes positive finding, [ ]  denotes negative finding Cardiac  Comments:  Chest pain or chest pressure:    Shortness of breath upon exertion: x   Short of breath when lying flat:    Irregular heart rhythm:        Vascular    Pain in calf, thigh, or hip brought on by ambulation:    Pain in feet at night that wakes you up from your sleep:     Blood clot in your veins:    Leg swelling:  x       Pulmonary    Oxygen at home:    Productive cough:     Wheezing:  x       Neurologic    Sudden weakness in arms or legs:     Sudden numbness in arms or legs:     Sudden onset of difficulty speaking or slurred speech:    Temporary loss of vision in one eye:     Problems with dizziness:         Gastrointestinal    Blood in stool:     Vomited blood:         Genitourinary    Burning when urinating:     Blood in urine:        Psychiatric    Major depression:         Hematologic    Bleeding problems:    Problems with blood clotting too easily:        Skin    Rashes or ulcers:        Constitutional    Fever or chills:      PHYSICAL EXAM: Vitals:   01/15/21 1435  BP: 115/75  Pulse: 78  Temp: 98.1 F (36.7 C)  TempSrc: Oral  SpO2: 97%  Weight: 259 lb (117.5 kg)  Height: 5\' 9"  (1.753 m)    GENERAL: The patient is a well-nourished male, in no acute distress. The vital signs are documented above. CARDIOVASCULAR: 2+ radial and 2+ dorsalis pedis pulses bilaterally.  Pitting edema from his knees distally  bilaterally PULMONARY: There is good air exchange  MUSCULOSKELETAL: There are no major deformities or cyanosis. NEUROLOGIC: No focal weakness or paresthesias are detected. SKIN: There are no ulcers or rashes noted. PSYCHIATRIC: The patient has a normal affect.  DATA:  Noninvasive studies reveal ankle arm index of 1.0 on the right and 0.82 on the left he has triphasic normal waveforms bilaterally  MEDICAL ISSUES: I discussed the findings with the patient.  He does not have any evidence of significant lower extremity arterial insufficiency.  I did discuss his swelling.  He is on diuretic currently and explained the importance of elevation and also attempt  Knee-high compression.  He was reassured with this discussion will see [place] again on an as-needed basis   Rosetta Posner, MD [facility] [phone] Pager [phone]  Note: Portions of this report may have been transcribed using voice recognition software.  Every effort has been made to ensure accuracy; however, inadvertent computerized transcription errors may still be present.

## 2021-01-16 ENCOUNTER — Ambulatory Visit (HOSPITAL_COMMUNITY)
Admission: RE | Admit: 2021-01-16 | Discharge: 2021-01-16 | Disposition: A | Discharge status: Home / Self Care | Payer: PPO | Source: Ambulatory Visit | Attending: Family Medicine | Admitting: Family Medicine

## 2021-01-16 ENCOUNTER — Other Ambulatory Visit: Payer: Self-pay

## 2021-01-16 DIAGNOSIS — R600 Localized edema: Secondary | ICD-10-CM | POA: Diagnosis not present

## 2021-01-16 DIAGNOSIS — R0602 Shortness of breath: Secondary | ICD-10-CM

## 2021-01-16 LAB — ECHOCARDIOGRAM COMPLETE
AR max vel: 2.81 cm2
AV Area VTI: 3.2 cm2
AV Area mean vel: 2.55 cm2
AV Mean grad: 2.4 mmHg
AV Peak grad: 4.2 mmHg
Ao pk vel: 1.02 m/s
Area-P 1/2: 4.3 cm2
Calc EF: 48.2 %
S' Lateral: 3.8 cm
Single Plane A2C EF: 49.6 %
Single Plane A4C EF: 48.1 %

## 2021-01-16 NOTE — Progress Notes (Signed)
*  PRELIMINARY RESULTS* Echocardiogram 2D Echocardiogram has been performed.  Dylan Tyler 01/16/2021, 3:00 PM

## 2021-01-23 ENCOUNTER — Other Ambulatory Visit: Payer: Self-pay | Admitting: "Endocrinology

## 2021-01-23 ENCOUNTER — Telehealth: Payer: Self-pay | Admitting: *Deleted

## 2021-01-23 NOTE — Telephone Encounter (Signed)
Patient informed. Copy sent to PCP °

## 2021-01-23 NOTE — Telephone Encounter (Signed)
-----   Message from Merlene Laughter, RN sent at 01/20/2021  5:00 PM EDT -----  ----- Message ----- From: Verta Ellen., NP Sent: 01/19/2021  11:27 PM EDT To: Laurine Blazer, LPN   Please call the patient and let him know the pumping function of his heart has improved since previous echo in June 2021.  The main pumping chamber is moderately thick and muscular.  This is could contribute to some degree to shortness of breath.  On previous echo in June 2021 it was only mildly muscular.  The best management is to maintain blood pressure at or below 130/80 consistently and manage all other risk factors.  Verta Ellen, NP  01/19/2021 11:26 PM

## 2021-02-07 ENCOUNTER — Encounter: Payer: Self-pay | Admitting: Internal Medicine

## 2021-02-07 ENCOUNTER — Ambulatory Visit: Payer: PPO | Admitting: Internal Medicine

## 2021-02-07 ENCOUNTER — Other Ambulatory Visit: Payer: Self-pay

## 2021-02-07 ENCOUNTER — Other Ambulatory Visit: Payer: Self-pay | Admitting: Cardiology

## 2021-02-07 VITALS — BP 148/80 | HR 59 | Ht 69.0 in | Wt 256.2 lb

## 2021-02-07 DIAGNOSIS — R0602 Shortness of breath: Secondary | ICD-10-CM

## 2021-02-07 DIAGNOSIS — I4821 Permanent atrial fibrillation: Secondary | ICD-10-CM | POA: Diagnosis not present

## 2021-02-07 DIAGNOSIS — I459 Conduction disorder, unspecified: Secondary | ICD-10-CM

## 2021-02-07 DIAGNOSIS — I428 Other cardiomyopathies: Secondary | ICD-10-CM | POA: Diagnosis not present

## 2021-02-07 DIAGNOSIS — Z720 Tobacco use: Secondary | ICD-10-CM | POA: Diagnosis not present

## 2021-02-07 MED ORDER — FUROSEMIDE 40 MG PO TABS: 40.0000 mg | ORAL_TABLET | Freq: Every day | ORAL | 3 refills | Status: DC

## 2021-02-07 NOTE — Progress Notes (Signed)
PCP: Glenda Chroman, MD Primary Cardiologist: Dr Domenic Polite Primary EP:  Dr Odette Fraction Dylan Tyler is a 75 y.o. male who presents today for routine electrophysiology followup.  Since last being seen in our clinic, the patient reports doing poorly.  He has substantial SOB.  He is not active.  + edema.   Today, he denies symptoms of palpitations, chest pain,  dizziness, presyncope, or syncope.  The patient is otherwise without complaint today.   Past Medical History:  Diagnosis Date   Ankylosing spondylitis (HCC)    Anxiety    Aortic root dilatation (HCC)    AR (aortic regurgitation)    Atrial fibrillation and flutter (HCC)    Bladder cancer (HCC)    Chronic pain    Followed at [facility]   COPD (chronic obstructive pulmonary disease) (HCC)    DDD (degenerative disc disease), lumbosacral    DDD (degenerative disc disease), thoracolumbar    Degenerative cervical disc    Depression    Diabetes mellitus type II    Diverticulitis    Essential hypertension    GERD (gastroesophageal reflux disease)    History of kidney stones    Hyperlipidemia    Migraine    Nonischemic cardiomyopathy ([place])    Cardiolite 12/09 LVEF 55%, minor luminal irregularities at cardiac catheterization   OSA on CPAP    Rheumatoid arthritis (HCC)    Symptomatic bradycardia    Medtronic Advisa L dual-chamber pacemaker  05/2015 - Dr. Curt Bears   Past Surgical History:  Procedure Laterality Date   CARDIAC CATHETERIZATION  08/13/11   CARDIOVERSION N/A 07/02/2015   Procedure: CARDIOVERSION;  Surgeon: Sanda Klein, MD;  Location: [facility];  Service: Cardiovascular;  Laterality: N/A;   CATARACT EXTRACTION W/PHACO Left 02/10/2019   Procedure: CATARACT EXTRACTION PHACO AND INTRAOCULAR LENS PLACEMENT ([place]);  Surgeon: Baruch Goldmann, MD;  Location: [facility];  Service: Ophthalmology;  Laterality: Left;  CDE: 7.10   CATARACT EXTRACTION W/PHACO Right 02/27/2019   Procedure: CATARACT EXTRACTION PHACO AND INTRAOCULAR LENS  PLACEMENT RIGHT EYE CDE=11.84;  Surgeon: Baruch Goldmann, MD;  Location: [facility];  Service: Ophthalmology;  Laterality: Right;  right   CYSTOSCOPY WITH HOLMIUM LASER LITHOTRIPSY  1990's   EP IMPLANTABLE DEVICE N/A 05/21/2015   MDT Advisa DR MRI pacemaker implanted by Dr Curt Bears   KNEE ARTHROSCOPY Right ~ [place]  ~ 2008   TRANSURETHRAL RESECTION OF BLADDER TUMOR WITH GYRUS (TURBT-GYRUS)  1990's?    ROS- all systems are reviewed and negative except as per HPI above  Current Outpatient Medications  Medication Sig Dispense Refill   acetaminophen (TYLENOL) 500 MG tablet Take 500 mg by mouth every 6 (six) hours as needed (pain).      albuterol (PROVENTIL) (2.5 MG/3ML) 0.083% nebulizer solution Take 2.5 mg by nebulization every 6 (six) hours as needed for wheezing.     Alcohol Swabs 70 % PADS Use as directed 120 each 2   ALPRAZolam (XANAX) 0.5 MG tablet Take 0.5 mg by mouth 2 (two) times daily as needed.  1   BD PEN NEEDLE NANO U/F 32G X 4 MM MISC USE AS DIRECTED FOUR TIMES DAILY 200 each 5   carvedilol (COREG) 6.25 MG tablet TAKE ONE TABLET BY MOUTH TWICE DAILY. TAKE WITH A MEAL. 180 tablet 2   Cholecalciferol (VITAMIN D-3) 1000 UNITS CAPS Take 1,000 Units by mouth 2 (two) times daily.      cholestyramine light (PREVALITE) 4 g packet SMARTSIG:1 Packet(s)  By Mouth Twice Daily PRN     citalopram (CELEXA) 40 MG tablet Take 20-40 mg by mouth daily.      Continuous Blood Gluc Receiver (FREESTYLE LIBRE 14 DAY READER) DEVI USE AS DIRECTED. 1 each 0   Continuous Blood Gluc Sensor (FREESTYLE LIBRE 14 DAY SENSOR) MISC APPLY ONE SENSOR EVERY 14 DAYS 2 each 2   ELIQUIS 5 MG TABS tablet TAKE ONE TABLET BY MOUTH TWICE DAILY 60 tablet 6   ENTRESTO 24-26 MG TAKE ONE TABLET BY MOUTH TWICE DAILY 60 tablet 6   furosemide (LASIX) 20 MG tablet Take 20 mg by mouth daily as needed.     furosemide (LASIX) 40 MG tablet TAKE ONE TABLET (40MG ) BY MOUTH EVERY OTHER DAY ALTERNATING WITH TAKE  ONE-HALF (1/2) TABLET (20MG ) 45 tablet 2   gabapentin (NEURONTIN) 400 MG capsule Take 400 mg by mouth 3 (three) times daily.      IBU 800 MG tablet Take 800 mg by mouth every 6 (six) hours as needed.     Insulin Aspart FlexPen 100 UNIT/ML SOPN INJECT 25-31 UNITS THREE TIMES DAILY AS DIRECTED WITH MEALS. 90 mL 0   Insulin Pen Needle (B-D ULTRAFINE III SHORT PEN) 31G X 8 MM MISC Use to inject insulin 4 x daily 200 each 3   ipratropium-albuterol (DUONEB) 0.5-2.5 (3) MG/3ML SOLN Take 3 mLs by nebulization every 4 (four) hours as needed (for shortness of breath).     Lancets (FREESTYLE) lancets USE TO CHECK BLOOD SUGAR FOUR TIMES DAILY. 200 each 5   LANTUS SOLOSTAR 100 UNIT/ML Solostar Pen INJECT 100 UNITS INTO THE SKIN AT BEDTIME 30 mL 3   metoCLOPramide (REGLAN) 5 MG tablet Take 5 mg by mouth every 6 (six) hours as needed for nausea or vomiting.     nitroGLYCERIN (NITROSTAT) 0.4 MG SL tablet Place 1 tablet (0.4 mg total) under the tongue every 5 (five) minutes as needed for chest pain (if no relief after 2nd dose, proceed to the ED for an evaluation or call 911). 25 tablet 3   NON FORMULARY 1 each by Other route See admin instructions. CPAP - Uses nightly     simvastatin (ZOCOR) 40 MG tablet TAKE ONE TABLET BY MOUTH AT BEDTIME 90 tablet 3   traMADol (ULTRAM) 50 MG tablet Take 1 tablet by mouth daily as needed (pain).      TRELEGY ELLIPTA 100-62.5-25 MCG/INH AEPB Inhale 1 puff into the lungs daily.     triamcinolone cream (KENALOG) 0.1 % Apply 1 application topically 2 (two) times daily as needed.     vitamin B-12 (CYANOCOBALAMIN) 1000 MCG tablet Take 1,000 mcg by mouth 2 (two) times daily.     VOLTAREN 1 % GEL Apply 2 g topically 4 (four) times daily as needed (for pain).      No current facility-administered medications for this visit.    Physical Exam: Vitals:   02/07/21 0954  BP: (!) 148/80  Pulse: (!) 59  SpO2: 96%  Weight: 256 lb 3.2 oz (116.2 kg)  Height: 5\' 9"  (1.753 m)    GEN- The  patient is overweight and chronically ill appearing, alert and oriented x 3 today.   Head- normocephalic, atraumatic Eyes-  Sclera clear, conjunctiva pink Ears- hearing intact Oropharynx- clear Neck + JVD Lungs- + bibasilar rales, normal work of breathing Chest- pacemaker pocket is well healed Heart- Regular rate and rhythm, (paced) GI- soft, NT, ND, + BS Extremities- no clubbing, cyanosis, + edema  Pacemaker interrogation- reviewed in detail  today,  See PACEART report  Echo 01/16/21- EF 50%, moderate LVH  Assessment and Plan:  1. Symptomatic  complete heart block Normal pacemaker function See Pace Art report Rate response is adjusted to promote increased heart rate with activity, though he is not active. he is not device dependant today  2. Permanent afib Rate controlled On eliquis  3. HTN Continue coreg 6.25mg  BID  4. Obesity Body mass index is 37.83 kg/m. Lifestyle modification advised He would feel much better with weight loss and exercise.  5. Nonischemic CM with acute on chronic CHF Therapy is limited by ongoing tobacco, obesity, and renal failure.  Labs 5/22 are reviewed,  creatinine is 1.8 He is followed by nephrology.  Though current EF is 50%, his EF has been as low as 35%.  We could consider upgrade to CRT if his EF worsens in the future.  He has done well with medical therapy.  Increase lasix to 40mg  daily Sodium restriction advised  6. Tobacco Cessation advised  Risks, benefits and potential toxicities for medications prescribed and/or refilled reviewed with patient today.   Follow closely with Dr Domenic Polite I will see again in a year  He has multiple acute on chronic issues.  He is at risk for decompensation, hospitalization.  A high level of decision making was required for this encounter.  Thompson Grayer MD, [facility] 02/07/2021 10:27 AM

## 2021-02-07 NOTE — Telephone Encounter (Signed)
Pt saw Dr [name] today 02/07/21, last labs 11/22/20 Creat 1.84, age 75, weight 116.2kg, based on specified criteria pt is on appropriate dosage of Eliquis 5mg  BID.  Will refill rx.

## 2021-02-07 NOTE — Patient Instructions (Signed)
Medication Instructions:  Increase Lasix to 40mg  daily.  Continue all other medications.    Labwork: none  Testing/Procedures: none  Follow-Up: 1 year   Any Other Special Instructions Will Be Listed Below (If Applicable). 2 gm sodium diet info sheet given today   If you need a refill on your cardiac medications before your next appointment, please call your pharmacy.

## 2021-02-11 ENCOUNTER — Other Ambulatory Visit: Payer: Self-pay | Admitting: *Deleted

## 2021-02-11 MED ORDER — ENTRESTO 24-26 MG PO TABS: 1.0000 | ORAL_TABLET | Freq: Two times a day (BID) | ORAL | 6 refills | Status: DC

## 2021-02-19 DIAGNOSIS — N189 Chronic kidney disease, unspecified: Secondary | ICD-10-CM | POA: Diagnosis not present

## 2021-02-19 DIAGNOSIS — R809 Proteinuria, unspecified: Secondary | ICD-10-CM | POA: Diagnosis not present

## 2021-02-19 DIAGNOSIS — Z716 Tobacco abuse counseling: Secondary | ICD-10-CM | POA: Diagnosis not present

## 2021-02-19 DIAGNOSIS — I129 Hypertensive chronic kidney disease with stage 1 through stage 4 chronic kidney disease, or unspecified chronic kidney disease: Secondary | ICD-10-CM | POA: Diagnosis not present

## 2021-02-19 DIAGNOSIS — E1122 Type 2 diabetes mellitus with diabetic chronic kidney disease: Secondary | ICD-10-CM | POA: Diagnosis not present

## 2021-02-19 DIAGNOSIS — E1129 Type 2 diabetes mellitus with other diabetic kidney complication: Secondary | ICD-10-CM | POA: Diagnosis not present

## 2021-02-19 DIAGNOSIS — I5022 Chronic systolic (congestive) heart failure: Secondary | ICD-10-CM | POA: Diagnosis not present

## 2021-02-19 DIAGNOSIS — E559 Vitamin D deficiency, unspecified: Secondary | ICD-10-CM | POA: Diagnosis not present

## 2021-02-19 DIAGNOSIS — E6609 Other obesity due to excess calories: Secondary | ICD-10-CM | POA: Diagnosis not present

## 2021-02-23 NOTE — Progress Notes (Signed)
Cardiology Office Note  Date: 02/24/2021   ID: Dylan, Tyler [birth year], MRN [id number]  PCP:  Glenda Chroman, MD  Cardiologist:  Rozann Lesches, MD Electrophysiologist:  Thompson Grayer, MD   Chief Complaint: 6 to 8-week follow-up   history of Present Illness: Dylan Tyler is a 75 y.o. male with a history of permanent atrial fibrillation, secondary cardiomyopathy, cardiac pacemaker, stage III chronic kidney disease, aortic regurgitation, COPD, DM 2, HTN, GERD, OSA on CPAP, current smoker  Last encounter with Dr. Domenic Polite on 12/08/2019 for routine visit.  Did not report any palpitations, chest pain.  Having some issues with chronic back and knee pain.  Was seeing Dr. [name] for device management for his pacemaker.  Follow-up echo showed improvement in EF 40 to 45% with mild AR.  Plan was to continue Coreg, Entresto, Aldactone, Lasix and check a follow-up basic metabolic panel.  Patient was asymptomatic with his atrial fibrillation/flutter.  He was continuing on Eliquis for stroke prophylaxis.  Follow-up CBC was ordered.  Last creatinine was 1.6.  At previous visit he was here for 100-month follow-up.  He continued to complain of shortness of breath.  He had a long history of smoking and continue to smoke.  He stated he did not feel like the fluid medication was helping much.  He continues with chronic lower extremity edema.  He had been taking the Lasix as directed.  He was taking Lasix 40 mg every other day alternating with 20 mg.  He also had as needed 20 mg Lasix.  He continues on Entresto 24/26 mg p.o. twice daily.  He stated his diabetes wasup and down.  He had started seeing endocrinology in [place].  Had a recent renal function labs which had improved slightly.  Previous creatinine in September 2021 was 2.2.  In December 2021 creatinine was 1.6..  Creatinine on 03/15/2020 at [facility] was 2.15 with GFR of 29.  Most recent renal function on 11/22/2020 at [facility] creatinine 1.84 and GFR 35.  EKG  showed ventricular paced rhythm rate of 72 with underlying atrial fibrillation.  Recent hemoglobin A1c was 7.2%.  His weight was 259.  His weight on 11/26/2020 was 251.  He states he is taking his Entresto and diuretic therapy as ordered.  He was not very active due to debilitation and bad knees.  He was continuing with chronic lower extremity edema.  He stated he felt shortness of breath had worsened since last being seen.  He recently saw Dr [name] on 02/07/2021 complaining of doing poorly and having substantial SOB. He complained of lower extremity edema. He had a normally functioning pacemaker. Rate response was adjusted to promote increased heart rate with activity. His atrial fibrillation rate was controlled. He was continuing eliquis. Non ischemic cardiomyopathy with acute on chronic CHF therapy was limited by ongoing tobacco abuse, obesity, and renal failure. Dr [name] mentioned considering upgrade to CRT therapy if EF worsened in the future. Lasix was increased to 40 mg po daily and sodium restriction was advised. Smoking cessation advised     Past Medical History:  Diagnosis Date   Ankylosing spondylitis (HCC)    Anxiety    Aortic root dilatation (HCC)    AR (aortic regurgitation)    Atrial fibrillation and flutter (HCC)    Bladder cancer (HCC)    Chronic pain    Follow[facility]   COPD (chronic obstructive pulmonary disease) (HCC)    DDD (degenerative disc disease), lumbosacral    DDD (  degenerative disc disease), thoracolumbar    Degenerative cervical disc    Depression    Diabetes mellitus type II    Diverticulitis    Essential hypertension    GERD (gastroesophageal reflux disease)    History of kidney stones    Hyperlipidemia    Migraine    Nonischemic cardiomyopathy ([place])    Cardiolite 12/09 LVEF 55%, minor luminal irregularities at cardiac catheterization   OSA on CPAP    Rheumatoid arthritis ([place])    Symptomatic bradycardia    Medtronic Advisa L dual-chamber pacemaker   05/2015 - Dr. Curt Bears    Past Surgical History:  Procedure Laterality Date   CARDIAC CATHETERIZATION  08/13/11   CARDIOVERSION N/A 07/02/2015   Procedure: CARDIOVERSION;  Surgeon: Sanda Klein, MD;  Location: [facility] ENDOSCOPY;  Service: Cardiovascular;  Laterality: N/A;   CATARACT EXTRACTION W/PHACO Left 02/10/2019   Procedure: CATARACT EXTRACTION PHACO AND INTRAOCULAR LENS PLACEMENT ([place]);  Surgeon: Baruch Goldmann, MD;  Location: [facility];  Service: Ophthalmology;  Laterality: Left;  CDE: 7.10   CATARACT EXTRACTION W/PHACO Right 02/27/2019   Procedure: CATARACT EXTRACTION PHACO AND INTRAOCULAR LENS PLACEMENT RIGHT EYE CDE=11.84;  Surgeon: Baruch Goldmann, MD;  Location: [facility];  Service: Ophthalmology;  Laterality: Right;  right   CYSTOSCOPY WITH HOLMIUM LASER LITHOTRIPSY  1990's   EP IMPLANTABLE DEVICE N/A 05/21/2015   MDT Advisa DR MRI pacemaker implanted by Dr Curt Bears   KNEE ARTHROSCOPY Right ~ [place]  ~ 2008   TRANSURETHRAL RESECTION OF BLADDER TUMOR WITH GYRUS (TURBT-GYRUS)  1990's?    Current Outpatient Medications  Medication Sig Dispense Refill   acetaminophen (TYLENOL) 500 MG tablet Take 500 mg by mouth every 6 (six) hours as needed (pain).      albuterol (PROVENTIL) (2.5 MG/3ML) 0.083% nebulizer solution Take 2.5 mg by nebulization every 6 (six) hours as needed for wheezing.     Alcohol Swabs 70 % PADS Use as directed 120 each 2   ALPRAZolam (XANAX) 0.5 MG tablet Take 0.5 mg by mouth 2 (two) times daily as needed.  1   BD PEN NEEDLE NANO U/F 32G X 4 MM MISC USE AS DIRECTED FOUR TIMES DAILY 200 each 5   carvedilol (COREG) 6.25 MG tablet TAKE ONE TABLET BY MOUTH TWICE DAILY. TAKE WITH A MEAL. 180 tablet 2   Cholecalciferol (VITAMIN D-3) 1000 UNITS CAPS Take 1,000 Units by mouth 2 (two) times daily.      cholestyramine light (PREVALITE) 4 g packet SMARTSIG:1 Packet(s) By Mouth Twice Daily PRN     citalopram (CELEXA) 40 MG tablet Take 20-40 mg by mouth daily.       Continuous Blood Gluc Receiver (FREESTYLE LIBRE 14 DAY READER) DEVI USE AS DIRECTED. 1 each 0   Continuous Blood Gluc Sensor (FREESTYLE LIBRE 14 DAY SENSOR) MISC APPLY ONE SENSOR EVERY 14 DAYS 2 each 2   ELIQUIS 5 MG TABS tablet TAKE ONE TABLET BY MOUTH TWICE DAILY 60 tablet 6   furosemide (LASIX) 40 MG tablet Take 40 mg by mouth every other day. Alternating with 60mg  every other day.     gabapentin (NEURONTIN) 400 MG capsule Take 400 mg by mouth 3 (three) times daily.      IBU 800 MG tablet Take 800 mg by mouth every 6 (six) hours as needed.     Insulin Aspart FlexPen 100 UNIT/ML SOPN INJECT 25-31 UNITS THREE TIMES DAILY AS DIRECTED WITH MEALS. 90 mL 0   Insulin Pen Needle (B-D ULTRAFINE III SHORT  PEN) 31G X 8 MM MISC Use to inject insulin 4 x daily 200 each 3   ipratropium-albuterol (DUONEB) 0.5-2.5 (3) MG/3ML SOLN Take 3 mLs by nebulization every 4 (four) hours as needed (for shortness of breath).     Lancets (FREESTYLE) lancets USE TO CHECK BLOOD SUGAR FOUR TIMES DAILY. 200 each 5   LANTUS SOLOSTAR 100 UNIT/ML Solostar Pen INJECT 100 UNITS INTO THE SKIN AT BEDTIME 30 mL 3   metoCLOPramide (REGLAN) 5 MG tablet Take 5 mg by mouth every 6 (six) hours as needed for nausea or vomiting.     nitroGLYCERIN (NITROSTAT) 0.4 MG SL tablet Place 1 tablet (0.4 mg total) under the tongue every 5 (five) minutes as needed for chest pain (if no relief after 2nd dose, proceed to the ED for an evaluation or call 911). 25 tablet 3   NON FORMULARY 1 each by Other route See admin instructions. CPAP - Uses nightly     sacubitril-valsartan (ENTRESTO) 24-26 MG Take 1 tablet by mouth 2 (two) times daily. 60 tablet 6   simvastatin (ZOCOR) 40 MG tablet TAKE ONE TABLET BY MOUTH AT BEDTIME 90 tablet 3   traMADol (ULTRAM) 50 MG tablet Take 1 tablet by mouth daily as needed (pain).      TRELEGY ELLIPTA 100-62.5-25 MCG/INH AEPB Inhale 1 puff into the lungs daily.     triamcinolone cream (KENALOG) 0.1 % Apply 1 application  topically 2 (two) times daily as needed.     vitamin B-12 (CYANOCOBALAMIN) 1000 MCG tablet Take 1,000 mcg by mouth 2 (two) times daily.     VOLTAREN 1 % GEL Apply 2 g topically 4 (four) times daily as needed (for pain).      No current facility-administered medications for this visit.   Allergies:  Patient has no known allergies.   Social History: The patient  reports that he has been smoking e-cigarettes and cigarettes. He started smoking about 62 years ago. He has a 25.00 pack-year smoking history. He has quit using smokeless tobacco.  His smokeless tobacco use included chew. He reports current alcohol use of about 2.0 standard drinks per week. He reports current drug use. Drug: Cocaine.   Family History: The patient's family history includes Cancer in his father; Heart attack in his mother.   ROS:  Please see the history of present illness. Otherwise, complete review of systems is positive for none.  All other systems are reviewed and negative.   Physical Exam: VS:  BP 114/68   Pulse 74   Ht 5\' 9"  (1.753 m)   Wt 256 lb 3.2 oz (116.2 kg)   SpO2 96%   BMI 37.83 kg/m , BMI Body mass index is 37.83 kg/m.  Wt Readings from Last 3 Encounters:  02/24/21 256 lb 3.2 oz (116.2 kg)  02/07/21 256 lb 3.2 oz (116.2 kg)  01/15/21 259 lb (117.5 kg)    General: Obese patient appears comfortable at rest. Neck: Supple, no elevated JVP or carotid bruits, no thyromegaly. Lungs: Clear to auscultation, nonlabored breathing at rest. Cardiac: Regular rate and rhythm, no S3 or significant systolic murmur, no pericardial rub. Extremities:Mild pitting edema right greater than left, distal pulses 2+. Skin: Warm and dry. Musculoskeletal: No kyphosis. Neuropsychiatric: Alert and oriented x3, affect grossly appropriate.  ECG: 01/03/2021 ventricular paced rhythm with occasional PVCs rate of 72.  Recent Labwork: 03/15/2020: Potassium 5.0 06/25/2020: BUN 25; Creatinine 1.6; TSH 1.89     Component Value  Date/Time   CHOL 157 06/25/2020 0000  TRIG 155 06/25/2020 0000   HDL 44 06/25/2020 0000   LDLCALC 86 06/25/2020 0000    Other Studies Reviewed Today:  Echocardiogram 01/16/2021  1. Left ventricular ejection fraction, by estimation, is 50%. The left ventricle has low normal function. The left ventricle has no regional wall motion abnormalities. There is moderate left ventricular hypertrophy. Left ventricular diastolic parameters are indeterminate. 2. Right ventricular systolic function is normal. The right ventricular size is normal. There is normal pulmonary artery systolic pressure. 3. Left atrial size was moderately dilated. 4. Right atrial size was mildly dilated. 5. The mitral valve is normal in structure. No evidence of mitral valve regurgitation. No evidence of mitral stenosis. 6. The aortic valve is tricuspid. Aortic valve regurgitation is not visualized. No aortic stenosis is present. 7. The inferior vena cava is normal in size with greater than 50% respiratory variability, suggesting right atrial pressure of 3 mmHg.    Remote device check 12/08/2020 Scheduled remote reviewed. Normal device function.    Known permanent AF, on [place], good ventricular rate control.  The two fast A&V events appear to possibly be NSVT, sent to triage.  Next remote 91 days.    Echocardiogram 12/06/2019: 1. Left ventricular ejection fraction, by estimation, is 40 to 45%. The  left ventricle has mildly decreased function. The left ventricle  demonstrates global hypokinesis. There is mild left ventricular  hypertrophy. Left ventricular diastolic parameters  are indeterminate.   2. Right ventricular systolic function is low normal. The right  ventricular size is normal. Mildly increased right ventricular wall  thickness. There is normal pulmonary artery systolic pressure. The  estimated right ventricular systolic pressure is 63.0  mmHg.   3. Left atrial size was upper normal.   4. The mitral  valve is grossly normal. Trivial mitral valve  regurgitation.   5. The aortic valve is tricuspid. Aortic valve regurgitation is mild.  Mild aortic valve sclerosis is present, with no evidence of aortic valve  stenosis.   6. The inferior vena cava is normal in size with greater than 50%  respiratory variability, suggesting right atrial pressure of 3 mmHg.   Echocardiogram 04/13/2019 1. Left ventricular ejection fraction, by visual estimation, is 35 to 40%. The left ventricle has moderately decreased function. Normal left ventricular size. There is moderately increased left ventricular hypertrophy. 2. Left ventricular diastolic Doppler parameters are indeterminate pattern of LV diastolic filling. 3. Global right ventricle has normal systolic function.The right ventricular size is mildly enlarged. No increase in right ventricular wall thickness. 4. Left atrial size was mildly dilated. 5. Right atrial size was mildly dilated. 6. Mild aortic valve annular calcification. 7. Mild mitral annular calcification. 8. The mitral valve is degenerative. Mild mitral valve regurgitation. 9. The tricuspid valve is grossly normal. Tricuspid valve regurgitation is mild. 10. The aortic valve is tricuspid Aortic valve regurgitation is mild to moderate by color flow Doppler. Mild aortic valve sclerosis without stenosis. 11. The pulmonic valve was not well visualized. Pulmonic valve regurgitation is not visualized by color flow Doppler. 12. Mildly elevated pulmonary artery systolic pressure. 13. A pacer wire is visualized in the RA and RV. 14. The inferior vena cava is normal in size with greater than 50% respiratory variability, suggesting right atrial pressure of 3 mmHg.    Assessment and Plan:  1. Nonischemic cardiomyopathy ([place])   2. Permanent atrial fibrillation (HCC)   3. Stage 3b chronic kidney disease ([place])   4. Mixed hyperlipidemia   5. SOB (shortness of breath)  6. Lower extremity edema       1. Nonischemic cardiomyopathy (HCC)  Continue alternating Lasix 40 mg q. OD with 20 mg q. OD and 20 mg as needed.  Continue Entresto 24/26 mg p.o. twice daily. Recent echocardiogram 01/16/2021 demonstrated EF of 50%.  No WMA's, moderate LVH, indeterminate diastolic parameters, LA moderately dilated, RA mildly dilated.  Continue Lasix 40 mg q. OD alternating with 60 mg q. OD.  Continue carvedilol 6.25 mg p.o. twice daily.  Continue Entresto 24/26 mg p.o. twice daily.  2. Permanent atrial fibrillation (HCC) Heart rate is controlled today with a rate of 74.  Continue carvedilol 6.25 mg p.o. twice daily.  Continue Eliquis 5 mg p.o. twice daily.  No bleeding on Eliquis  3. Stage 3b chronic kidney disease [facility]) Recently saw Dr. Theador Hawthorne diabetes on 02/19/2021.  His creatinine trend had been 1.8 and 2022.  Classified as CKD stage III.  Had CKD since 2017.  CKD secondary to diabetes.  Dr. Theador Hawthorne ordered multiple labs including renal ultrasound.  Diabetes has not been well controlled.  Hemoglobin A1c on 11/22/2020 was 7.2% he recently started seeing Dr. Dorris Fetch in [place] for his diabetes.  4.  Hyperlipidemia Continue simvastatin 40 mg p.o. daily..Last lipid panel 06/25/2020: TG 155, TC 157, HDL 44, LDL 86   5.  Shortness of breath. Continues with shortness of breath.  Likely a result of morbid obesity, cardiomyopathy, obesity hypoventilation syndrome, sleep apnea, long-term smoking.  Offered patient referral to pulmonology.  He continues to smoke.  He states at this point he does not think he needs pulmonology yet.  States part of the reason he does not want to see pulmonology at this point is due to affordability issues.  6.  Lower extremity edema He has some mild lower extremity edema today.  He is continuing Lasix therapy.  7. Smoking He continues to smoke.  Highly advised cessation.   Medication Adjustments/Labs and Tests Ordered: Current medicines are reviewed at length with the patient  today.  Concerns regarding medicines are outlined above.   Disposition: Follow-up with Dr. Domenic Polite or APP 6 months  Signed, Levell July, NP 02/24/2021 12:40 PM    [facility] [place], [place], [place] [address] Phone: [phone]; Fax: [phone]

## 2021-02-24 ENCOUNTER — Encounter: Payer: Self-pay | Admitting: Family Medicine

## 2021-02-24 ENCOUNTER — Ambulatory Visit: Payer: PPO | Admitting: Family Medicine

## 2021-02-24 VITALS — BP 114/68 | HR 74 | Ht 69.0 in | Wt 256.2 lb

## 2021-02-24 DIAGNOSIS — R0602 Shortness of breath: Secondary | ICD-10-CM | POA: Diagnosis not present

## 2021-02-24 DIAGNOSIS — I428 Other cardiomyopathies: Secondary | ICD-10-CM

## 2021-02-24 DIAGNOSIS — N1832 Chronic kidney disease, stage 3b: Secondary | ICD-10-CM

## 2021-02-24 DIAGNOSIS — R600 Localized edema: Secondary | ICD-10-CM | POA: Diagnosis not present

## 2021-02-24 DIAGNOSIS — E782 Mixed hyperlipidemia: Secondary | ICD-10-CM | POA: Diagnosis not present

## 2021-02-24 DIAGNOSIS — I4821 Permanent atrial fibrillation: Secondary | ICD-10-CM | POA: Diagnosis not present

## 2021-02-24 NOTE — Patient Instructions (Addendum)
Medication Instructions:  Continue all current medications.   Labwork: none  Testing/Procedures: none  Follow-Up: 6 months   Any Other Special Instructions Will Be Listed Below (If Applicable).   If you need a refill on your cardiac medications before your next appointment, please call your pharmacy.  

## 2021-02-27 ENCOUNTER — Other Ambulatory Visit (HOSPITAL_BASED_OUTPATIENT_CLINIC_OR_DEPARTMENT_OTHER): Payer: Self-pay | Admitting: Nephrology

## 2021-02-27 ENCOUNTER — Other Ambulatory Visit (HOSPITAL_COMMUNITY): Payer: Self-pay | Admitting: Nephrology

## 2021-02-27 DIAGNOSIS — I129 Hypertensive chronic kidney disease with stage 1 through stage 4 chronic kidney disease, or unspecified chronic kidney disease: Secondary | ICD-10-CM

## 2021-02-27 DIAGNOSIS — R809 Proteinuria, unspecified: Secondary | ICD-10-CM

## 2021-02-27 DIAGNOSIS — I5022 Chronic systolic (congestive) heart failure: Secondary | ICD-10-CM

## 2021-02-27 DIAGNOSIS — E1122 Type 2 diabetes mellitus with diabetic chronic kidney disease: Secondary | ICD-10-CM

## 2021-02-27 DIAGNOSIS — E1129 Type 2 diabetes mellitus with other diabetic kidney complication: Secondary | ICD-10-CM

## 2021-02-27 DIAGNOSIS — E559 Vitamin D deficiency, unspecified: Secondary | ICD-10-CM

## 2021-03-05 ENCOUNTER — Ambulatory Visit (INDEPENDENT_AMBULATORY_CARE_PROVIDER_SITE_OTHER): Payer: PPO

## 2021-03-05 DIAGNOSIS — I428 Other cardiomyopathies: Secondary | ICD-10-CM | POA: Diagnosis not present

## 2021-03-11 LAB — CUP PACEART REMOTE DEVICE CHECK
Battery Remaining Longevity: 19 mo
Battery Voltage: 2.92 V
Brady Statistic RA Percent Paced: 0 %
Brady Statistic RV Percent Paced: 93.49 %
Date Time Interrogation Session: 20220831201712
Implantable Lead Implant Date: 20161115
Implantable Lead Implant Date: 20161115
Implantable Lead Location: 753859
Implantable Lead Location: 753860
Implantable Lead Model: 5076
Implantable Lead Model: 5076
Implantable Pulse Generator Implant Date: 20161115
Lead Channel Impedance Value: 361 Ohm
Lead Channel Impedance Value: 361 Ohm
Lead Channel Impedance Value: 456 Ohm
Lead Channel Impedance Value: 494 Ohm
Lead Channel Pacing Threshold Amplitude: 0.5 V
Lead Channel Pacing Threshold Amplitude: 0.875 V
Lead Channel Pacing Threshold Pulse Width: 0.4 ms
Lead Channel Pacing Threshold Pulse Width: 0.4 ms
Lead Channel Sensing Intrinsic Amplitude: 12.375 mV
Lead Channel Sensing Intrinsic Amplitude: 12.375 mV
Lead Channel Sensing Intrinsic Amplitude: 2.625 mV
Lead Channel Sensing Intrinsic Amplitude: 2.625 mV
Lead Channel Setting Pacing Amplitude: 2.5 V
Lead Channel Setting Pacing Pulse Width: 0.4 ms
Lead Channel Setting Sensing Sensitivity: 2.8 mV

## 2021-03-12 ENCOUNTER — Other Ambulatory Visit: Payer: Self-pay | Admitting: "Endocrinology

## 2021-03-12 ENCOUNTER — Other Ambulatory Visit: Payer: Self-pay | Admitting: Nurse Practitioner

## 2021-03-12 DIAGNOSIS — IMO0002 Reserved for concepts with insufficient information to code with codable children: Secondary | ICD-10-CM

## 2021-03-12 DIAGNOSIS — E1122 Type 2 diabetes mellitus with diabetic chronic kidney disease: Secondary | ICD-10-CM

## 2021-03-13 ENCOUNTER — Other Ambulatory Visit: Payer: Self-pay

## 2021-03-13 ENCOUNTER — Ambulatory Visit (HOSPITAL_COMMUNITY)
Admission: RE | Admit: 2021-03-13 | Discharge: 2021-03-13 | Disposition: A | Discharge status: Home / Self Care | Payer: PPO | Source: Ambulatory Visit | Attending: Nephrology | Admitting: Nephrology

## 2021-03-13 DIAGNOSIS — E6609 Other obesity due to excess calories: Secondary | ICD-10-CM | POA: Diagnosis not present

## 2021-03-13 DIAGNOSIS — E1129 Type 2 diabetes mellitus with other diabetic kidney complication: Secondary | ICD-10-CM | POA: Diagnosis not present

## 2021-03-13 DIAGNOSIS — E559 Vitamin D deficiency, unspecified: Secondary | ICD-10-CM | POA: Diagnosis not present

## 2021-03-13 DIAGNOSIS — I129 Hypertensive chronic kidney disease with stage 1 through stage 4 chronic kidney disease, or unspecified chronic kidney disease: Secondary | ICD-10-CM | POA: Diagnosis not present

## 2021-03-13 DIAGNOSIS — I5022 Chronic systolic (congestive) heart failure: Secondary | ICD-10-CM | POA: Diagnosis not present

## 2021-03-13 DIAGNOSIS — N189 Chronic kidney disease, unspecified: Secondary | ICD-10-CM | POA: Diagnosis not present

## 2021-03-13 DIAGNOSIS — Z716 Tobacco abuse counseling: Secondary | ICD-10-CM | POA: Diagnosis not present

## 2021-03-13 DIAGNOSIS — R809 Proteinuria, unspecified: Secondary | ICD-10-CM | POA: Insufficient documentation

## 2021-03-13 DIAGNOSIS — E1122 Type 2 diabetes mellitus with diabetic chronic kidney disease: Secondary | ICD-10-CM | POA: Insufficient documentation

## 2021-03-18 NOTE — Progress Notes (Signed)
Remote pacemaker transmission.   

## 2021-03-20 DIAGNOSIS — I129 Hypertensive chronic kidney disease with stage 1 through stage 4 chronic kidney disease, or unspecified chronic kidney disease: Secondary | ICD-10-CM | POA: Diagnosis not present

## 2021-03-20 DIAGNOSIS — E790 Hyperuricemia without signs of inflammatory arthritis and tophaceous disease: Secondary | ICD-10-CM | POA: Diagnosis not present

## 2021-03-20 DIAGNOSIS — R809 Proteinuria, unspecified: Secondary | ICD-10-CM | POA: Diagnosis not present

## 2021-03-20 DIAGNOSIS — E559 Vitamin D deficiency, unspecified: Secondary | ICD-10-CM | POA: Diagnosis not present

## 2021-03-20 DIAGNOSIS — E1129 Type 2 diabetes mellitus with other diabetic kidney complication: Secondary | ICD-10-CM | POA: Diagnosis not present

## 2021-03-20 DIAGNOSIS — E6609 Other obesity due to excess calories: Secondary | ICD-10-CM | POA: Diagnosis not present

## 2021-03-20 DIAGNOSIS — Z716 Tobacco abuse counseling: Secondary | ICD-10-CM | POA: Diagnosis not present

## 2021-03-20 DIAGNOSIS — I5022 Chronic systolic (congestive) heart failure: Secondary | ICD-10-CM | POA: Diagnosis not present

## 2021-03-20 DIAGNOSIS — E211 Secondary hyperparathyroidism, not elsewhere classified: Secondary | ICD-10-CM | POA: Diagnosis not present

## 2021-03-20 DIAGNOSIS — E1122 Type 2 diabetes mellitus with diabetic chronic kidney disease: Secondary | ICD-10-CM | POA: Diagnosis not present

## 2021-03-20 DIAGNOSIS — N189 Chronic kidney disease, unspecified: Secondary | ICD-10-CM | POA: Diagnosis not present

## 2021-03-31 ENCOUNTER — Ambulatory Visit (INDEPENDENT_AMBULATORY_CARE_PROVIDER_SITE_OTHER): Payer: PPO | Admitting: Nurse Practitioner

## 2021-03-31 ENCOUNTER — Encounter: Payer: Self-pay | Admitting: Nurse Practitioner

## 2021-03-31 ENCOUNTER — Other Ambulatory Visit: Payer: Self-pay

## 2021-03-31 VITALS — BP 127/70 | HR 78 | Ht 69.0 in | Wt 254.6 lb

## 2021-03-31 DIAGNOSIS — E782 Mixed hyperlipidemia: Secondary | ICD-10-CM

## 2021-03-31 DIAGNOSIS — I10 Essential (primary) hypertension: Secondary | ICD-10-CM

## 2021-03-31 DIAGNOSIS — E1165 Type 2 diabetes mellitus with hyperglycemia: Secondary | ICD-10-CM | POA: Diagnosis not present

## 2021-03-31 DIAGNOSIS — E1122 Type 2 diabetes mellitus with diabetic chronic kidney disease: Secondary | ICD-10-CM

## 2021-03-31 DIAGNOSIS — N183 Chronic kidney disease, stage 3 unspecified: Secondary | ICD-10-CM | POA: Diagnosis not present

## 2021-03-31 DIAGNOSIS — IMO0002 Reserved for concepts with insufficient information to code with codable children: Secondary | ICD-10-CM

## 2021-03-31 MED ORDER — LANTUS SOLOSTAR 100 UNIT/ML ~~LOC~~ SOPN: 80.0000 [IU] | PEN_INJECTOR | Freq: Every day | SUBCUTANEOUS | 3 refills | Status: DC

## 2021-03-31 NOTE — Patient Instructions (Signed)

## 2021-03-31 NOTE — Progress Notes (Signed)
03/31/2021            Endocrinology follow-up note   Subjective:    Patient ID: Dylan Tyler, male    DOB: Oct 26, 1945.  he is being seen in follow-up  for management of currently uncontrolled, complicated type 2 diabetes, hyperlipidemia, hypertension.   PMD:  Glenda Chroman, MD.   Past Medical History:  Diagnosis Date   Ankylosing spondylitis ([place])    Anxiety    Aortic root dilatation (HCC)    AR (aortic regurgitation)    Atrial fibrillation and flutter (HCC)    Bladder cancer (HCC)    Chronic pain    Followed at [facility]   COPD (chronic obstructive pulmonary disease) (HCC)    DDD (degenerative disc disease), lumbosacral    DDD (degenerative disc disease), thoracolumbar    Degenerative cervical disc    Depression    Diabetes mellitus type II    Diverticulitis    Essential hypertension    GERD (gastroesophageal reflux disease)    History of kidney stones    Hyperlipidemia    Migraine    Nonischemic cardiomyopathy ([place])    Cardiolite 12/09 LVEF 55%, minor luminal irregularities at cardiac catheterization   OSA on CPAP    Rheumatoid arthritis (HCC)    Symptomatic bradycardia    Medtronic Advisa L dual-chamber pacemaker  05/2015 - Dr. Curt Bears   Past Surgical History:  Procedure Laterality Date   CARDIAC CATHETERIZATION  08/13/11   CARDIOVERSION N/A 07/02/2015   Procedure: CARDIOVERSION;  Surgeon: Sanda Klein, MD;  Location: [facility];  Service: Cardiovascular;  Laterality: N/A;   CATARACT EXTRACTION W/PHACO Left 02/10/2019   Procedure: CATARACT EXTRACTION PHACO AND INTRAOCULAR LENS PLACEMENT ([place]);  Surgeon: Baruch Goldmann, MD;  Location: [facility];  Service: Ophthalmology;  Laterality: Left;  CDE: 7.10   CATARACT EXTRACTION W/PHACO Right 02/27/2019   Procedure: CATARACT EXTRACTION PHACO AND INTRAOCULAR LENS PLACEMENT RIGHT EYE CDE=11.84;  Surgeon: Baruch Goldmann, MD;  Location: [facility];  Service: Ophthalmology;  Laterality: Right;  right   CYSTOSCOPY WITH HOLMIUM LASER  LITHOTRIPSY  1990's   EP IMPLANTABLE DEVICE N/A 05/21/2015   MDT Advisa DR MRI pacemaker implanted by Dr Curt Bears   KNEE ARTHROSCOPY Right ~ [place]  ~ 2008   TRANSURETHRAL RESECTION OF BLADDER TUMOR WITH GYRUS (TURBT-GYRUS)  1990's?   Social History   Socioeconomic History   Marital status: Married    Spouse name: Not on file   Number of children: Not on file   Years of education: Not on file   Highest education level: Not on file  Occupational History   Occupation: Disabled    Employer: DISABLED  Tobacco Use   Smoking status: Every Day    Packs/day: 0.50    Years: 50.00    Pack years: 25.00    Types: E-cigarettes, Cigarettes    Start date: 02/26/1959   Smokeless tobacco: Former    Types: Chew   Tobacco comments:    05/21/2015 "quit chewing in 1992"  Vaping Use   Vaping Use: Every day   Substances: Nicotine, Flavoring  Substance and Sexual Activity   Alcohol use: Yes    Alcohol/week: 2.0 standard drinks    Types: 2 Cans of beer per week   Drug use: Yes    Types: Cocaine    Comment: 05/21/2015 "quit in the early 2000"   Sexual activity: Not on file  Other Topics Concern   Not on file  Social History Narrative  Divorced   No regular exercise   Social Determinants of Health   Financial Resource Strain: Not on file  Food Insecurity: Not on file  Transportation Needs: Not on file  Physical Activity: Not on file  Stress: Not on file  Social Connections: Not on file   Outpatient Encounter Medications as of 03/31/2021  Medication Sig   acetaminophen (TYLENOL) 500 MG tablet Take 500 mg by mouth every 6 (six) hours as needed (pain).    albuterol (PROVENTIL) (2.5 MG/3ML) 0.083% nebulizer solution Take 2.5 mg by nebulization every 6 (six) hours as needed for wheezing.   albuterol (VENTOLIN HFA) 108 (90 Base) MCG/ACT inhaler SMARTSIG:1-2 Puff(s) Via Inhaler Every 4-6 Hours PRN   Alcohol Swabs 70 % PADS Use as directed   ALPRAZolam (XANAX) 0.5  MG tablet Take 0.5 mg by mouth 2 (two) times daily as needed.   BD PEN NEEDLE NANO U/F 32G X 4 MM MISC USE AS DIRECTED FOUR TIMES DAILY   calcitRIOL (ROCALTROL) 0.25 MCG capsule Take 0.25 mcg by mouth 3 (three) times a week.   carvedilol (COREG) 6.25 MG tablet TAKE ONE TABLET BY MOUTH TWICE DAILY. TAKE WITH A MEAL.   Cholecalciferol (VITAMIN D-3) 1000 UNITS CAPS Take 1,000 Units by mouth 2 (two) times daily.    cholestyramine light (PREVALITE) 4 g packet SMARTSIG:1 Packet(s) By Mouth Twice Daily PRN   citalopram (CELEXA) 40 MG tablet Take 20-40 mg by mouth daily.    Continuous Blood Gluc Receiver (FREESTYLE LIBRE 14 DAY READER) DEVI USE AS DIRECTED.   Continuous Blood Gluc Sensor (FREESTYLE LIBRE 14 DAY SENSOR) MISC APPLY ONE SENSOR EVERY 14 DAYS   ELIQUIS 5 MG TABS tablet TAKE ONE TABLET BY MOUTH TWICE DAILY   furosemide (LASIX) 20 MG tablet Take by mouth.   furosemide (LASIX) 40 MG tablet Take 40 mg by mouth every other day. Alternating with 60mg  every other day.   gabapentin (NEURONTIN) 400 MG capsule Take 400 mg by mouth 3 (three) times daily.    glipiZIDE (GLUCOTROL XL) 5 MG 24 hr tablet Take by mouth.   IBU 800 MG tablet Take 800 mg by mouth every 6 (six) hours as needed.   Insulin Aspart FlexPen 100 UNIT/ML SOPN INJECT 25-31 UNITS THREE TIMES DAILY AS DIRECTED WITH MEALS.   Insulin Pen Needle (B-D ULTRAFINE III SHORT PEN) 31G X 8 MM MISC Use to inject insulin 4 x daily   ipratropium-albuterol (DUONEB) 0.5-2.5 (3) MG/3ML SOLN Take 3 mLs by nebulization every 4 (four) hours as needed (for shortness of breath).   Lancets (FREESTYLE) lancets USE TO CHECK BLOOD SUGAR FOUR TIMES DAILY.   magnesium oxide (MAG-OX) 400 (240 Mg) MG tablet Take 1 tablet by mouth daily.   metoCLOPramide (REGLAN) 5 MG tablet Take 5 mg by mouth every 6 (six) hours as needed for nausea or vomiting.   nitroGLYCERIN (NITROSTAT) 0.4 MG SL tablet Place 1 tablet (0.4 mg total) under the tongue every 5 (five) minutes as  needed for chest pain (if no relief after 2nd dose, proceed to the ED for an evaluation or call 911).   NON FORMULARY 1 each by Other route See admin instructions. CPAP - Uses nightly   sacubitril-valsartan (ENTRESTO) 24-26 MG Take 1 tablet by mouth 2 (two) times daily.   simvastatin (ZOCOR) 40 MG tablet TAKE ONE TABLET BY MOUTH AT BEDTIME   traMADol (ULTRAM) 50 MG tablet Take 1 tablet by mouth daily as needed (pain).    TRELEGY ELLIPTA 100-62.5-25 MCG/INH AEPB  Inhale 1 puff into the lungs daily.   triamcinolone cream (KENALOG) 0.1 % Apply 1 application topically 2 (two) times daily as needed.   vitamin B-12 (CYANOCOBALAMIN) 1000 MCG tablet Take 1,000 mcg by mouth 2 (two) times daily.   VOLTAREN 1 % GEL Apply 2 g topically 4 (four) times daily as needed (for pain).    [DISCONTINUED] LANTUS SOLOSTAR 100 UNIT/ML Solostar Pen INJECT 100 UNITS INTO THE SKIN AT BEDTIME   insulin glargine (LANTUS SOLOSTAR) 100 UNIT/ML Solostar Pen Inject 80 Units into the skin at bedtime.   No facility-administered encounter medications on file as of 03/31/2021.    ALLERGIES: No Known Allergies  VACCINATION STATUS:  There is no immunization history on file for this patient.  Diabetes He presents for his follow-up diabetic visit. He has type 2 diabetes mellitus. Onset time: He was diagnosed at approximate age of 10 years. His disease course has been improving. Hypoglycemia symptoms include nervousness/anxiousness and tremors. Pertinent negatives for hypoglycemia include no confusion, hunger, pallor or seizures. (Nausea, hypothermia) Associated symptoms include foot paresthesias. Pertinent negatives for diabetes include no fatigue, no polydipsia, no polyphagia, no polyuria and no weakness. Hypoglycemia complications include nocturnal hypoglycemia. Symptoms are stable. Diabetic complications include heart disease, nephropathy and peripheral neuropathy. Risk factors for coronary artery disease include dyslipidemia,  diabetes mellitus, hypertension, male sex, sedentary lifestyle, tobacco exposure, family history and obesity. Current diabetic treatment includes intensive insulin program. He is compliant with treatment most of the time. His weight is fluctuating minimally. He is following a generally unhealthy diet. When asked about meal planning, he reported none. He has not had a previous visit with a dietitian. He never [facility] with cane due to bilateral knee arthritis) participates in exercise. His home blood glucose trend is decreasing steadily. His overall blood glucose range is 140-180 mg/dl. (He presents today, accompanied by his wife, with his CGM and logs showing controlled fasting and postprandial glycemic profile.  His previsit A1c was 6.8% on 03/13/21, improving from last visit of 7.1%.  Analysis of his CGM shows TIR 69%, TAR 27%, TBR 4%.  ) An ACE inhibitor/angiotensin II receptor blocker is being taken. He sees a podiatrist.Eye exam is current.  Hyperlipidemia This is a chronic problem. The current episode started more than 1 year ago. The problem is controlled. Recent lipid tests were reviewed and are normal. Exacerbating diseases include chronic renal disease, diabetes and obesity. Factors aggravating his hyperlipidemia include smoking, fatty foods and beta blockers. Pertinent negatives include no myalgias or shortness of breath. Current antihyperlipidemic treatment includes statins. The current treatment provides mild improvement of lipids. Compliance problems include adherence to diet and adherence to exercise.  Risk factors for coronary artery disease include dyslipidemia, diabetes mellitus, hypertension, a sedentary lifestyle, male sex and obesity.  Hypertension This is a chronic problem. The current episode started more than 1 year ago. The problem has been resolved since onset. The problem is controlled. Pertinent negatives include no shortness of breath. There are no associated agents to hypertension. Risk  factors for coronary artery disease include diabetes mellitus, dyslipidemia, family history, male gender, obesity, sedentary lifestyle and smoking/tobacco exposure. Past treatments include beta blockers and diuretics. The current treatment provides significant improvement. Compliance problems include diet and exercise.  Hypertensive end-organ damage includes kidney disease and CAD/MI. Identifiable causes of hypertension include chronic renal disease.   Review of systems  Constitutional: + Minimally fluctuating body weight,  current  Body mass index is 37.6 kg/m. , + fatigue, no subjective hyperthermia,  no subjective hypothermia Eyes: no blurry vision, no xerophthalmia ENT: no sore throat, no nodules palpated in throat, no dysphagia/odynophagia, no hoarseness Cardiovascular: no Chest Pain, no Shortness of Breath, no palpitations, no leg swelling Respiratory: chronic cough (smoker), intermittent shortness of breath Gastrointestinal: no Nausea/Vomiting/Diarrhea Musculoskeletal: bilateral knee pain (under evaluation for bilateral TKR) - using crutches Skin: no rashes, no hyperemia, multiple nonhealing wounds to BLE Neurological: no tremors, no numbness, no tingling, + intermittent dizziness Psychiatric: no depression, no anxiety   Objective:    BP 127/70   Pulse 78   Ht 5\' 9"  (1.753 m)   Wt 254 lb 9.6 oz (115.5 kg)   BMI 37.60 kg/m   Wt Readings from Last 3 Encounters:  03/31/21 254 lb 9.6 oz (115.5 kg)  02/24/21 256 lb 3.2 oz (116.2 kg)  02/07/21 256 lb 3.2 oz (116.2 kg)    BP Readings from Last 3 Encounters:  03/31/21 127/70  02/24/21 114/68  02/07/21 (!) 148/80    Physical Exam- Limited  Constitutional:  Body mass index is 37.6 kg/m. , not in acute distress, normal state of mind Eyes:  EOMI, no exophthalmos Neck: Supple Cardiovascular: RRR, no murmurs, rubs, or gallops, no edema Respiratory: SOB at rest with chronic congested cough, no crackles, rales, rhonchi, or  wheezing Musculoskeletal: no gross deformities, strength intact in all four extremities, + walks with crutches due to bilateral knee arthritis Skin:  no rashes, no hyperemia Neurological: no tremor with outstretched hands     Lipid Panel     Component Value Date/Time   CHOL 157 06/25/2020 0000   TRIG 155 06/25/2020 0000   HDL 44 06/25/2020 0000   LDLCALC 86 06/25/2020 0000   Recent Results (from the past 2160 hour(s))  ECHOCARDIOGRAM COMPLETE     Status: None   Collection Time: 01/16/21  2:59 PM  Result Value Ref Range   Single Plane A2C EF 49.6 %   Single Plane A4C EF 48.1 %   Calc EF 48.2 %   Area-P 1/2 4.30 cm2   S' Lateral 3.80 cm   AR max vel 2.81 cm2   AV Area mean vel 2.55 cm2   AV Area VTI 3.20 cm2   Ao pk vel 1.02 m/s   AV Peak grad 4.2 mmHg   AV Mean grad 2.4 mmHg  CUP PACEART REMOTE DEVICE CHECK     Status: None   Collection Time: 03/05/21  8:17 PM  Result Value Ref Range   Date Time Interrogation Session [id number]    Pulse Generator Manufacturer MERM    Pulse Gen Model [id number] Advisa DR MRI    Pulse Gen Serial Number [id number]    Clinic Name [facility]    Implantable Pulse Generator Type Implantable Pulse Generator    Implantable Pulse Generator Implant Date [id number]    Implantable Lead Manufacturer [facility]    Implantable Lead Model 5076 CapSureFix Novus    Implantable Lead Serial Number [id number]    Implantable Lead Implant Date [id number]    Implantable Lead Location Detail 1 UNKNOWN    Implantable Lead Location [id number]    Implantable Lead Manufacturer [facility]    Implantable Lead Model 5076 CapSureFix Novus    Implantable Lead Serial Number [id number]    Implantable Lead Implant Date [id number]    Implantable Lead Location Detail 1 UNKNOWN    Implantable Lead Location [id number]    Lead Channel Setting Sensing Sensitivity 2.8 mV   Lead Channel Setting Pacing Pulse Width 0.4 ms   Lead  Channel Setting Pacing Amplitude 2.5 V   Lead Channel Impedance Value  494 ohm   Lead Channel Impedance Value 361 ohm   Lead Channel Sensing Intrinsic Amplitude 2.625 mV   Lead Channel Sensing Intrinsic Amplitude 2.625 mV   Lead Channel Pacing Threshold Amplitude 0.5 V   Lead Channel Pacing Threshold Pulse Width 0.4 ms   Lead Channel Impedance Value 456 ohm   Lead Channel Impedance Value 361 ohm   Lead Channel Sensing Intrinsic Amplitude 12.375 mV   Lead Channel Sensing Intrinsic Amplitude 12.375 mV   Lead Channel Pacing Threshold Amplitude 0.875 V   Lead Channel Pacing Threshold Pulse Width 0.4 ms   Battery Status OK    Battery Remaining Longevity 19 mo   Battery Voltage 2.92 V   Brady Statistic RA Percent Paced 0 %   Brady Statistic RV Percent Paced 93.49 %   CMP Latest Ref Rng & Units 06/25/2020 03/15/2020 06/08/2019  Glucose 70 - 99 mg/dL - - -  BUN 4 - 21 25(A) 22(A) 20  Creatinine 0.6 - 1.3 1.6(A) 2.2(A) 1.6(A)  Sodium 137 - 147 - - 138  Potassium 3.4 - 5.3 - 5.0 5.2  Chloride 99 - 108 - - 102  CO2 13 - 22 - - 25(A)  Calcium 8.7 - 10.7 9.2 9.3 9.4  Total Protein 6.5 - 8.1 g/dL - - -  Total Bilirubin 0.3 - 1.2 mg/dL - - -  Alkaline Phos 25 - 125 - - 81  AST 14 - 40 - - 21  ALT 10 - 40 - - 38     Assessment & Plan:   1) Type 2 diabetes mellitus with stage 3 chronic kidney disease, without long-term current use of insulin (HCC)  - Patient has currently uncontrolled symptomatic type 2 DM since 75 years of age.  He presents today, accompanied by his wife, with his CGM and logs showing controlled fasting and postprandial glycemic profile.  His previsit A1c was 6.8% on 03/13/21, improving from last visit of 7.1%.  Analysis of his CGM shows TIR 69%, TAR 27%, TBR 4%.    -his diabetes is complicated by stage 3 renal insufficiency (improving), obesity/sedentary life, neuropathy, cardiomyopathy, chronic heavy smoking, inadequate insurance and JAMARCUS LADUKE remains at a high risk for more acute and chronic complications which include CAD, CVA, CKD,  retinopathy, and neuropathy. These are all discussed in detail with the patient.  - Nutritional counseling repeated at each appointment due to patients tendency to fall back in to old habits.  - The patient admits there is a room for improvement in their diet and drink choices. -  Suggestion is made for the patient to avoid simple carbohydrates from their diet including Cakes, Sweet Desserts / Pastries, Ice Cream, Soda (diet and regular), Sweet Tea, Candies, Chips, Cookies, Sweet Pastries, Store Bought Juices, Alcohol in Excess of 1-2 drinks a day, Artificial Sweeteners, Coffee Creamer, and "Sugar-free" Products. This will help patient to have stable blood glucose profile and potentially avoid unintended weight gain.   - I encouraged the patient to switch to unprocessed or minimally processed complex starch and increased protein intake (animal or plant source), fruits, and vegetables.   - Patient is advised to stick to a routine mealtimes to eat 3 meals a day and avoid unnecessary snacks (to snack only to correct hypoglycemia).  - I have approached him with the following individualized plan to manage diabetes and patient agrees:   - Based on his glucose profile, he will  continue to need intensive treatment with higher dose of basal/bolus insulin in order for him to achieve control of diabetes to target.    -Based on his frequency of hypoglycemia, his Lantus will be lowered to 80 units SQ nightly.  He can continue his Novolog 25-31 units TID with meals if glucose is above 90 and he is eating (Specific instructions on how to titrate insulin dosage based on glucose readings given to patient in writing).   -He is encouraged to continue using his CGM to monitor blood glucose 4 times per day, before meals and at bedtime and report to the clinic if blood glucose levels are less than 70 or greater than 200 for 3 tests in a row.  - He is not a candidate for metformin therapy due to CKD.  - He is not a  candidate for incretin therapy (such as Ozempic) due to increased risk of pancreatitis r/t hypertriglyceridemia and heavy smoking history.  2) Lipids/HPL:  His recent lipid panel from 06/25/20 shows controlled LDL of 86 and improved triglycerides of 155.  He is advised to continue Cholestyramine 4 g TID with meals and Simvastatin 40 mg po daily at bedtime.  Side effects and precautions discussed with him.  3) Hypertension His blood pressure is controlled to target for his age.  He is advised to continue Coreg 6.25 mg po twice daily, Lasix 40 mg po every other day (alternating with 20 mg), and Entresto 24-26 mg po twice daily.   4) Chronic Care/Health Maintenance: -he is on ACEI/ARB and Statin medications and  is encouraged to continue to follow up with Ophthalmology, Dentist,  Podiatrist at least yearly or according to recommendations. - I have recommended yearly flu vaccine and pneumonia vaccination at least every 5 years; and  sleep for at least 7 hours a day.  The patient was counseled on the dangers of tobacco use, and was advised to quit.  Reviewed strategies to maximize success, including removing cigarettes and smoking materials from environment.  5) Weight management:  His Body mass index is 37.6 kg/m. He is a candidate for modest weight loss.  Exercise regimen and carbs restrictions were detailed with him.   - I advised patient to maintain close follow up with Glenda Chroman, MD for primary care needs.      I spent 40 minutes in the care of the patient today including review of labs from [place], Lipids, Thyroid Function, Hematology (current and previous including abstractions from other facilities); face-to-face time discussing  his blood glucose readings/logs, discussing hypoglycemia and hyperglycemia episodes and symptoms, medications doses, his options of short and long term treatment based on the latest standards of care / guidelines;  discussion about incorporating lifestyle  medicine;  and documenting the encounter.    Please refer to Patient Instructions for Blood Glucose Monitoring and Insulin/Medications Dosing Guide"  in media tab for additional information. Please  also refer to " Patient Self Inventory" in the Media  tab for reviewed elements of pertinent patient history.  Dylan Tyler participated in the discussions, expressed understanding, and voiced agreement with the above plans.  All questions were answered to his satisfaction. he is encouraged to contact clinic should he have any questions or concerns prior to his return visit.   Follow up plan: - Return in about 4 months (around 07/31/2021) for Diabetes F/U with A1c in office, No previsit labs, Bring meter and logs.    Rayetta Pigg, FNP-BC [facility] [address]  [place], [place] [address] Phone: [phone] Fax: [phone]   03/31/2021, 3:06 PM

## 2021-04-10 ENCOUNTER — Other Ambulatory Visit: Payer: Self-pay | Admitting: Cardiology

## 2021-04-18 DIAGNOSIS — M179 Osteoarthritis of knee, unspecified: Secondary | ICD-10-CM | POA: Diagnosis not present

## 2021-04-18 DIAGNOSIS — F419 Anxiety disorder, unspecified: Secondary | ICD-10-CM | POA: Diagnosis not present

## 2021-04-18 DIAGNOSIS — M459 Ankylosing spondylitis of unspecified sites in spine: Secondary | ICD-10-CM | POA: Diagnosis not present

## 2021-04-18 DIAGNOSIS — F17210 Nicotine dependence, cigarettes, uncomplicated: Secondary | ICD-10-CM | POA: Diagnosis not present

## 2021-04-18 DIAGNOSIS — Z299 Encounter for prophylactic measures, unspecified: Secondary | ICD-10-CM | POA: Diagnosis not present

## 2021-04-18 DIAGNOSIS — I10 Essential (primary) hypertension: Secondary | ICD-10-CM | POA: Diagnosis not present

## 2021-04-18 DIAGNOSIS — E1165 Type 2 diabetes mellitus with hyperglycemia: Secondary | ICD-10-CM | POA: Diagnosis not present

## 2021-04-23 DIAGNOSIS — E1151 Type 2 diabetes mellitus with diabetic peripheral angiopathy without gangrene: Secondary | ICD-10-CM | POA: Diagnosis not present

## 2021-04-23 DIAGNOSIS — Z6837 Body mass index (BMI) 37.0-37.9, adult: Secondary | ICD-10-CM | POA: Diagnosis not present

## 2021-04-23 DIAGNOSIS — I2510 Atherosclerotic heart disease of native coronary artery without angina pectoris: Secondary | ICD-10-CM | POA: Diagnosis not present

## 2021-04-23 DIAGNOSIS — E1159 Type 2 diabetes mellitus with other circulatory complications: Secondary | ICD-10-CM | POA: Diagnosis not present

## 2021-04-23 DIAGNOSIS — J449 Chronic obstructive pulmonary disease, unspecified: Secondary | ICD-10-CM | POA: Diagnosis not present

## 2021-04-23 DIAGNOSIS — Z794 Long term (current) use of insulin: Secondary | ICD-10-CM | POA: Diagnosis not present

## 2021-04-23 DIAGNOSIS — E785 Hyperlipidemia, unspecified: Secondary | ICD-10-CM | POA: Diagnosis not present

## 2021-04-23 DIAGNOSIS — Z720 Tobacco use: Secondary | ICD-10-CM | POA: Diagnosis not present

## 2021-04-23 DIAGNOSIS — I10 Essential (primary) hypertension: Secondary | ICD-10-CM | POA: Diagnosis not present

## 2021-04-23 DIAGNOSIS — E1169 Type 2 diabetes mellitus with other specified complication: Secondary | ICD-10-CM | POA: Diagnosis not present

## 2021-04-23 DIAGNOSIS — Z7951 Long term (current) use of inhaled steroids: Secondary | ICD-10-CM | POA: Diagnosis not present

## 2021-04-23 DIAGNOSIS — Z8679 Personal history of other diseases of the circulatory system: Secondary | ICD-10-CM | POA: Diagnosis not present

## 2021-04-23 DIAGNOSIS — E1143 Type 2 diabetes mellitus with diabetic autonomic (poly)neuropathy: Secondary | ICD-10-CM | POA: Diagnosis not present

## 2021-05-12 DIAGNOSIS — M1612 Unilateral primary osteoarthritis, left hip: Secondary | ICD-10-CM | POA: Diagnosis not present

## 2021-05-12 DIAGNOSIS — M170 Bilateral primary osteoarthritis of knee: Secondary | ICD-10-CM | POA: Diagnosis not present

## 2021-05-13 ENCOUNTER — Other Ambulatory Visit: Payer: Self-pay | Admitting: Orthopedic Surgery

## 2021-05-13 ENCOUNTER — Other Ambulatory Visit: Payer: Self-pay | Admitting: Nurse Practitioner

## 2021-05-13 DIAGNOSIS — M1612 Unilateral primary osteoarthritis, left hip: Secondary | ICD-10-CM

## 2021-05-20 ENCOUNTER — Other Ambulatory Visit (HOSPITAL_COMMUNITY): Payer: Self-pay | Admitting: Orthopedic Surgery

## 2021-05-20 ENCOUNTER — Other Ambulatory Visit (HOSPITAL_BASED_OUTPATIENT_CLINIC_OR_DEPARTMENT_OTHER): Payer: Self-pay | Admitting: Orthopedic Surgery

## 2021-05-20 DIAGNOSIS — M1612 Unilateral primary osteoarthritis, left hip: Secondary | ICD-10-CM

## 2021-06-03 ENCOUNTER — Other Ambulatory Visit: Payer: PPO

## 2021-06-06 DIAGNOSIS — D692 Other nonthrombocytopenic purpura: Secondary | ICD-10-CM | POA: Diagnosis not present

## 2021-06-06 DIAGNOSIS — M25561 Pain in right knee: Secondary | ICD-10-CM | POA: Diagnosis not present

## 2021-06-06 DIAGNOSIS — M456 Ankylosing spondylitis lumbar region: Secondary | ICD-10-CM | POA: Diagnosis not present

## 2021-06-06 DIAGNOSIS — Z7901 Long term (current) use of anticoagulants: Secondary | ICD-10-CM | POA: Diagnosis not present

## 2021-06-06 DIAGNOSIS — M25562 Pain in left knee: Secondary | ICD-10-CM | POA: Diagnosis not present

## 2021-06-10 ENCOUNTER — Other Ambulatory Visit: Payer: Self-pay | Admitting: Nurse Practitioner

## 2021-06-11 DIAGNOSIS — F17210 Nicotine dependence, cigarettes, uncomplicated: Secondary | ICD-10-CM | POA: Diagnosis not present

## 2021-06-11 DIAGNOSIS — E7800 Pure hypercholesterolemia, unspecified: Secondary | ICD-10-CM | POA: Diagnosis not present

## 2021-06-11 DIAGNOSIS — Z79899 Other long term (current) drug therapy: Secondary | ICD-10-CM | POA: Diagnosis not present

## 2021-06-11 DIAGNOSIS — E119 Type 2 diabetes mellitus without complications: Secondary | ICD-10-CM | POA: Diagnosis not present

## 2021-06-11 DIAGNOSIS — Z125 Encounter for screening for malignant neoplasm of prostate: Secondary | ICD-10-CM | POA: Diagnosis not present

## 2021-06-11 DIAGNOSIS — Z6838 Body mass index (BMI) 38.0-38.9, adult: Secondary | ICD-10-CM | POA: Diagnosis not present

## 2021-06-11 DIAGNOSIS — I10 Essential (primary) hypertension: Secondary | ICD-10-CM | POA: Diagnosis not present

## 2021-06-11 DIAGNOSIS — Z1339 Encounter for screening examination for other mental health and behavioral disorders: Secondary | ICD-10-CM | POA: Diagnosis not present

## 2021-06-11 DIAGNOSIS — Z1331 Encounter for screening for depression: Secondary | ICD-10-CM | POA: Diagnosis not present

## 2021-06-11 DIAGNOSIS — Z299 Encounter for prophylactic measures, unspecified: Secondary | ICD-10-CM | POA: Diagnosis not present

## 2021-06-11 DIAGNOSIS — Z0000 Encounter for general adult medical examination without abnormal findings: Secondary | ICD-10-CM | POA: Diagnosis not present

## 2021-06-11 DIAGNOSIS — R5383 Other fatigue: Secondary | ICD-10-CM | POA: Diagnosis not present

## 2021-06-11 DIAGNOSIS — Z7189 Other specified counseling: Secondary | ICD-10-CM | POA: Diagnosis not present

## 2021-06-12 ENCOUNTER — Ambulatory Visit (INDEPENDENT_AMBULATORY_CARE_PROVIDER_SITE_OTHER): Payer: PPO

## 2021-06-12 DIAGNOSIS — R001 Bradycardia, unspecified: Secondary | ICD-10-CM

## 2021-06-12 LAB — CUP PACEART REMOTE DEVICE CHECK
Battery Remaining Longevity: 18 mo
Battery Voltage: 2.91 V
Brady Statistic RA Percent Paced: 0 %
Brady Statistic RV Percent Paced: 90.6 %
Date Time Interrogation Session: 20221207224123
Implantable Lead Implant Date: 20161115
Implantable Lead Implant Date: 20161115
Implantable Lead Location: 753859
Implantable Lead Location: 753860
Implantable Lead Model: 5076
Implantable Lead Model: 5076
Implantable Pulse Generator Implant Date: 20161115
Lead Channel Impedance Value: 342 Ohm
Lead Channel Impedance Value: 361 Ohm
Lead Channel Impedance Value: 437 Ohm
Lead Channel Impedance Value: 456 Ohm
Lead Channel Pacing Threshold Amplitude: 0.5 V
Lead Channel Pacing Threshold Amplitude: 1 V
Lead Channel Pacing Threshold Pulse Width: 0.4 ms
Lead Channel Pacing Threshold Pulse Width: 0.4 ms
Lead Channel Sensing Intrinsic Amplitude: 11.125 mV
Lead Channel Sensing Intrinsic Amplitude: 11.125 mV
Lead Channel Sensing Intrinsic Amplitude: 2.25 mV
Lead Channel Sensing Intrinsic Amplitude: 2.25 mV
Lead Channel Setting Pacing Amplitude: 2.5 V
Lead Channel Setting Pacing Pulse Width: 0.4 ms
Lead Channel Setting Sensing Sensitivity: 2.8 mV

## 2021-06-16 DIAGNOSIS — M25512 Pain in left shoulder: Secondary | ICD-10-CM | POA: Diagnosis not present

## 2021-06-20 DIAGNOSIS — E211 Secondary hyperparathyroidism, not elsewhere classified: Secondary | ICD-10-CM | POA: Diagnosis not present

## 2021-06-20 DIAGNOSIS — D472 Monoclonal gammopathy: Secondary | ICD-10-CM | POA: Diagnosis not present

## 2021-06-20 DIAGNOSIS — I5022 Chronic systolic (congestive) heart failure: Secondary | ICD-10-CM | POA: Diagnosis not present

## 2021-06-20 DIAGNOSIS — E1122 Type 2 diabetes mellitus with diabetic chronic kidney disease: Secondary | ICD-10-CM | POA: Diagnosis not present

## 2021-06-20 DIAGNOSIS — I129 Hypertensive chronic kidney disease with stage 1 through stage 4 chronic kidney disease, or unspecified chronic kidney disease: Secondary | ICD-10-CM | POA: Diagnosis not present

## 2021-06-20 DIAGNOSIS — N189 Chronic kidney disease, unspecified: Secondary | ICD-10-CM | POA: Diagnosis not present

## 2021-06-20 NOTE — Progress Notes (Signed)
Remote pacemaker transmission.   

## 2021-07-11 ENCOUNTER — Other Ambulatory Visit: Payer: Self-pay

## 2021-07-11 ENCOUNTER — Ambulatory Visit (HOSPITAL_COMMUNITY)
Admission: RE | Admit: 2021-07-11 | Discharge: 2021-07-11 | Disposition: A | Discharge status: Home / Self Care | Payer: PPO | Source: Ambulatory Visit | Attending: Orthopedic Surgery | Admitting: Orthopedic Surgery

## 2021-07-11 DIAGNOSIS — M1612 Unilateral primary osteoarthritis, left hip: Secondary | ICD-10-CM | POA: Diagnosis not present

## 2021-07-14 DIAGNOSIS — Z79899 Other long term (current) drug therapy: Secondary | ICD-10-CM | POA: Diagnosis not present

## 2021-07-14 DIAGNOSIS — F419 Anxiety disorder, unspecified: Secondary | ICD-10-CM | POA: Diagnosis not present

## 2021-07-14 DIAGNOSIS — Z299 Encounter for prophylactic measures, unspecified: Secondary | ICD-10-CM | POA: Diagnosis not present

## 2021-07-14 DIAGNOSIS — I10 Essential (primary) hypertension: Secondary | ICD-10-CM | POA: Diagnosis not present

## 2021-07-14 DIAGNOSIS — M459 Ankylosing spondylitis of unspecified sites in spine: Secondary | ICD-10-CM | POA: Diagnosis not present

## 2021-07-14 DIAGNOSIS — F17210 Nicotine dependence, cigarettes, uncomplicated: Secondary | ICD-10-CM | POA: Diagnosis not present

## 2021-07-28 DIAGNOSIS — I25118 Atherosclerotic heart disease of native coronary artery with other forms of angina pectoris: Secondary | ICD-10-CM | POA: Diagnosis not present

## 2021-07-28 DIAGNOSIS — Z794 Long term (current) use of insulin: Secondary | ICD-10-CM | POA: Diagnosis not present

## 2021-07-28 DIAGNOSIS — E1142 Type 2 diabetes mellitus with diabetic polyneuropathy: Secondary | ICD-10-CM | POA: Diagnosis not present

## 2021-07-28 DIAGNOSIS — M25561 Pain in right knee: Secondary | ICD-10-CM | POA: Diagnosis not present

## 2021-07-28 DIAGNOSIS — M456 Ankylosing spondylitis lumbar region: Secondary | ICD-10-CM | POA: Diagnosis not present

## 2021-07-28 DIAGNOSIS — E1122 Type 2 diabetes mellitus with diabetic chronic kidney disease: Secondary | ICD-10-CM | POA: Diagnosis not present

## 2021-07-28 DIAGNOSIS — N1832 Chronic kidney disease, stage 3b: Secondary | ICD-10-CM | POA: Diagnosis not present

## 2021-07-28 DIAGNOSIS — F3340 Major depressive disorder, recurrent, in remission, unspecified: Secondary | ICD-10-CM | POA: Diagnosis not present

## 2021-07-28 DIAGNOSIS — J449 Chronic obstructive pulmonary disease, unspecified: Secondary | ICD-10-CM | POA: Diagnosis not present

## 2021-07-28 DIAGNOSIS — E1143 Type 2 diabetes mellitus with diabetic autonomic (poly)neuropathy: Secondary | ICD-10-CM | POA: Diagnosis not present

## 2021-07-28 DIAGNOSIS — I5042 Chronic combined systolic (congestive) and diastolic (congestive) heart failure: Secondary | ICD-10-CM | POA: Diagnosis not present

## 2021-07-28 DIAGNOSIS — E1151 Type 2 diabetes mellitus with diabetic peripheral angiopathy without gangrene: Secondary | ICD-10-CM | POA: Diagnosis not present

## 2021-08-04 ENCOUNTER — Ambulatory Visit: Payer: PPO | Admitting: Nurse Practitioner

## 2021-08-04 NOTE — Patient Instructions (Incomplete)

## 2021-08-06 NOTE — Patient Instructions (Signed)

## 2021-08-07 ENCOUNTER — Encounter: Payer: Self-pay | Admitting: Nurse Practitioner

## 2021-08-07 ENCOUNTER — Ambulatory Visit (INDEPENDENT_AMBULATORY_CARE_PROVIDER_SITE_OTHER): Payer: PPO | Admitting: Nurse Practitioner

## 2021-08-07 VITALS — BP 132/82 | HR 91 | Ht 69.0 in | Wt 248.0 lb

## 2021-08-07 DIAGNOSIS — Z794 Long term (current) use of insulin: Secondary | ICD-10-CM | POA: Diagnosis not present

## 2021-08-07 DIAGNOSIS — N184 Chronic kidney disease, stage 4 (severe): Secondary | ICD-10-CM

## 2021-08-07 DIAGNOSIS — E1122 Type 2 diabetes mellitus with diabetic chronic kidney disease: Secondary | ICD-10-CM

## 2021-08-07 DIAGNOSIS — I10 Essential (primary) hypertension: Secondary | ICD-10-CM

## 2021-08-07 DIAGNOSIS — F17200 Nicotine dependence, unspecified, uncomplicated: Secondary | ICD-10-CM

## 2021-08-07 DIAGNOSIS — E782 Mixed hyperlipidemia: Secondary | ICD-10-CM | POA: Diagnosis not present

## 2021-08-07 LAB — POCT GLYCOSYLATED HEMOGLOBIN (HGB A1C): Hemoglobin A1C: 7.4 % — AB (ref 4.0–5.6)

## 2021-08-07 NOTE — Progress Notes (Signed)
08/07/2021            Endocrinology follow-up note   Subjective:    Patient ID: Dylan Tyler, male    DOB: 08-01-1945.  he is being seen in follow-up  for management of currently uncontrolled, complicated type 2 diabetes, hyperlipidemia, hypertension.   PMD:  Glenda Chroman, MD.   Past Medical History:  Diagnosis Date   Ankylosing spondylitis ([place])    Anxiety    Aortic root dilatation (HCC)    AR (aortic regurgitation)    Atrial fibrillation and flutter (HCC)    Bladder cancer (HCC)    Chronic pain    Followed at [facility]   COPD (chronic obstructive pulmonary disease) (HCC)    DDD (degenerative disc disease), lumbosacral    DDD (degenerative disc disease), thoracolumbar    Degenerative cervical disc    Depression    Diabetes mellitus type II    Diverticulitis    Essential hypertension    GERD (gastroesophageal reflux disease)    History of kidney stones    Hyperlipidemia    Migraine    Nonischemic cardiomyopathy ([place])    Cardiolite 12/09 LVEF 55%, minor luminal irregularities at cardiac catheterization   OSA on CPAP    Rheumatoid arthritis (HCC)    Symptomatic bradycardia    Medtronic Advisa L dual-chamber pacemaker  05/2015 - Dr. Curt Bears   Past Surgical History:  Procedure Laterality Date   CARDIAC CATHETERIZATION  08/13/11   CARDIOVERSION N/A 07/02/2015   Procedure: CARDIOVERSION;  Surgeon: Sanda Klein, MD;  Location: [facility];  Service: Cardiovascular;  Laterality: N/A;   CATARACT EXTRACTION W/PHACO Left 02/10/2019   Procedure: CATARACT EXTRACTION PHACO AND INTRAOCULAR LENS PLACEMENT ([place]);  Surgeon: Baruch Goldmann, MD;  Location: [facility];  Service: Ophthalmology;  Laterality: Left;  CDE: 7.10   CATARACT EXTRACTION W/PHACO Right 02/27/2019   Procedure: CATARACT EXTRACTION PHACO AND INTRAOCULAR LENS PLACEMENT RIGHT EYE CDE=11.84;  Surgeon: Baruch Goldmann, MD;  Location: [facility];  Service: Ophthalmology;  Laterality: Right;  right   CYSTOSCOPY WITH HOLMIUM LASER  LITHOTRIPSY  1990's   EP IMPLANTABLE DEVICE N/A 05/21/2015   MDT Advisa DR MRI pacemaker implanted by Dr Curt Bears   KNEE ARTHROSCOPY Right ~ [facility]  ~ 2008   TRANSURETHRAL RESECTION OF BLADDER TUMOR WITH GYRUS (TURBT-GYRUS)  1990's?   Social History   Socioeconomic History   Marital status: Married    Spouse name: Not on file   Number of children: Not on file   Years of education: Not on file   Highest education level: Not on file  Occupational History   Occupation: Disabled    Employer: DISABLED  Tobacco Use   Smoking status: Every Day    Packs/day: 0.50    Years: 50.00    Pack years: 25.00    Types: E-cigarettes, Cigarettes    Start date: 02/26/1959   Smokeless tobacco: Former    Types: Chew   Tobacco comments:    05/21/2015 "quit chewing in 1992"  Vaping Use   Vaping Use: Every day   Substances: Nicotine, Flavoring  Substance and Sexual Activity   Alcohol use: Yes    Alcohol/week: 2.0 standard drinks    Types: 2 Cans of beer per week   Drug use: Yes    Types: Cocaine    Comment: 05/21/2015 "quit in the early 2000"   Sexual activity: Not on file  Other Topics Concern   Not on file  Social History Narrative  Divorced   No regular exercise   Social Determinants of Health   Financial Resource Strain: Not on file  Food Insecurity: Not on file  Transportation Needs: Not on file  Physical Activity: Not on file  Stress: Not on file  Social Connections: Not on file   Outpatient Encounter Medications as of 08/07/2021  Medication Sig   acetaminophen (TYLENOL) 500 MG tablet Take 500 mg by mouth every 6 (six) hours as needed (pain).    albuterol (PROVENTIL) (2.5 MG/3ML) 0.083% nebulizer solution Take 2.5 mg by nebulization every 6 (six) hours as needed for wheezing.   albuterol (VENTOLIN HFA) 108 (90 Base) MCG/ACT inhaler SMARTSIG:1-2 Puff(s) Via Inhaler Every 4-6 Hours PRN   Alcohol Swabs 70 % PADS Use as directed   ALPRAZolam (XANAX) 0.5  MG tablet Take 0.5 mg by mouth 2 (two) times daily as needed.   BD PEN NEEDLE NANO U/F 32G X 4 MM MISC USE AS DIRECTED FOUR TIMES DAILY   calcitRIOL (ROCALTROL) 0.25 MCG capsule Take 0.25 mcg by mouth 3 (three) times a week.   carvedilol (COREG) 6.25 MG tablet TAKE ONE TABLET BY MOUTH TWICE DAILY. TAKE WITH A MEAL.   Cholecalciferol (VITAMIN D-3) 1000 UNITS CAPS Take 1,000 Units by mouth 2 (two) times daily.    cholestyramine light (PREVALITE) 4 g packet SMARTSIG:1 Packet(s) By Mouth Twice Daily PRN   citalopram (CELEXA) 40 MG tablet Take 20-40 mg by mouth daily.    Continuous Blood Gluc Receiver (FREESTYLE LIBRE 14 DAY READER) DEVI USE AS DIRECTED.   Continuous Blood Gluc Sensor (FREESTYLE LIBRE 14 DAY SENSOR) MISC APPLY ONE SENSOR EVERY 14 DAYS AS DIRECTED   ELIQUIS 5 MG TABS tablet TAKE ONE TABLET BY MOUTH TWICE DAILY   furosemide (LASIX) 20 MG tablet Take by mouth.   furosemide (LASIX) 40 MG tablet Take 40 mg by mouth every other day. Alternating with 60mg  every other day.   gabapentin (NEURONTIN) 400 MG capsule Take 400 mg by mouth 3 (three) times daily.    IBU 800 MG tablet Take 800 mg by mouth every 6 (six) hours as needed.   Insulin Aspart FlexPen (NOVOLOG) 100 UNIT/ML INJECT 25-31 UNITS THREE TIMES DAILY AS DIRECTED WITH MEALS.   insulin glargine (LANTUS SOLOSTAR) 100 UNIT/ML Solostar Pen Inject 80 Units into the skin at bedtime.   Insulin Pen Needle (B-D ULTRAFINE III SHORT PEN) 31G X 8 MM MISC Use to inject insulin 4 x daily   ipratropium-albuterol (DUONEB) 0.5-2.5 (3) MG/3ML SOLN Take 3 mLs by nebulization every 4 (four) hours as needed (for shortness of breath).   Lancets (FREESTYLE) lancets USE TO CHECK BLOOD SUGAR FOUR TIMES DAILY.   magnesium oxide (MAG-OX) 400 (240 Mg) MG tablet Take 1 tablet by mouth daily.   metoCLOPramide (REGLAN) 5 MG tablet Take 5 mg by mouth every 6 (six) hours as needed for nausea or vomiting.   nitroGLYCERIN (NITROSTAT) 0.4 MG SL tablet Place 1 tablet  (0.4 mg total) under the tongue every 5 (five) minutes as needed for chest pain (if no relief after 2nd dose, proceed to the ED for an evaluation or call 911).   NON FORMULARY 1 each by Other route See admin instructions. CPAP - Uses nightly   sacubitril-valsartan (ENTRESTO) 24-26 MG Take 1 tablet by mouth 2 (two) times daily.   simvastatin (ZOCOR) 40 MG tablet TAKE ONE TABLET BY MOUTH AT BEDTIME   traMADol (ULTRAM) 50 MG tablet Take 1 tablet by mouth daily as needed (pain).  TRELEGY ELLIPTA 100-62.5-25 MCG/INH AEPB Inhale 1 puff into the lungs daily.   triamcinolone cream (KENALOG) 0.1 % Apply 1 application topically 2 (two) times daily as needed.   vitamin B-12 (CYANOCOBALAMIN) 1000 MCG tablet Take 1,000 mcg by mouth 2 (two) times daily.   VOLTAREN 1 % GEL Apply 2 g topically 4 (four) times daily as needed (for pain).    [DISCONTINUED] glipiZIDE (GLUCOTROL XL) 5 MG 24 hr tablet Take by mouth.   No facility-administered encounter medications on file as of 08/07/2021.    ALLERGIES: No Known Allergies  VACCINATION STATUS:  There is no immunization history on file for this patient.  Diabetes He presents for his follow-up diabetic visit. He has type 2 diabetes mellitus. Onset time: He was diagnosed at approximate age of 22 years. His disease course has been stable. There are no hypoglycemic associated symptoms. Pertinent negatives for hypoglycemia include no confusion, hunger, pallor or seizures. Associated symptoms include foot paresthesias and weight loss (intentional). Pertinent negatives for diabetes include no fatigue, no polydipsia, no polyphagia, no polyuria and no weakness. There are no hypoglycemic complications. Symptoms are stable. Diabetic complications include heart disease, nephropathy and peripheral neuropathy. Risk factors for coronary artery disease include dyslipidemia, diabetes mellitus, hypertension, male sex, sedentary lifestyle, tobacco exposure, family history and obesity.  Current diabetic treatment includes intensive insulin program. He is compliant with treatment most of the time. His weight is decreasing steadily. He is following a generally unhealthy diet. When asked about meal planning, he reported none. He has not had a previous visit with a dietitian. He never [facility] with cane due to bilateral knee arthritis) participates in exercise. His home blood glucose trend is fluctuating minimally. His overall blood glucose range is 140-180 mg/dl. (He presents today, accompanied by his wife, with his CGM and logs showing stable, at goal glycemic profile overall.  His POCT A1c today is 7.4%, increasing from last visit of 6.8%, yet still a good target for him given his age and comorbidities.  He denies any significant hypoglycemia.  Analysis of his CGM shows TIR 62%, TAR 38%, TBR 0%.) An ACE inhibitor/angiotensin II receptor blocker is being taken. He sees a podiatrist.Eye exam is current.  Hyperlipidemia This is a chronic problem. The current episode started more than 1 year ago. The problem is controlled. Recent lipid tests were reviewed and are normal. Exacerbating diseases include chronic renal disease, diabetes and obesity. Factors aggravating his hyperlipidemia include smoking, fatty foods and beta blockers. Pertinent negatives include no myalgias or shortness of breath. Current antihyperlipidemic treatment includes statins. The current treatment provides mild improvement of lipids. Compliance problems include adherence to diet and adherence to exercise.  Risk factors for coronary artery disease include dyslipidemia, diabetes mellitus, hypertension, a sedentary lifestyle, male sex and obesity.  Hypertension This is a chronic problem. The current episode started more than 1 year ago. The problem has been resolved since onset. The problem is controlled. Pertinent negatives include no shortness of breath. There are no associated agents to hypertension. Risk factors for coronary  artery disease include diabetes mellitus, dyslipidemia, family history, male gender, obesity, sedentary lifestyle and smoking/tobacco exposure. Past treatments include beta blockers and diuretics. The current treatment provides significant improvement. Compliance problems include diet and exercise.  Hypertensive end-organ damage includes kidney disease and CAD/MI. Identifiable causes of hypertension include chronic renal disease.   Review of systems  Constitutional: + steadily decreasing body weight,  current  Body mass index is 36.62 kg/m. , + fatigue, no subjective  hyperthermia, no subjective hypothermia Eyes: no blurry vision, no xerophthalmia ENT: no sore throat, no nodules palpated in throat, no dysphagia/odynophagia, no hoarseness Cardiovascular: no Chest Pain, no Shortness of Breath, no palpitations, no leg swelling Respiratory: chronic cough (smoker), intermittent shortness of breath Gastrointestinal: no Nausea/Vomiting/Diarrhea Musculoskeletal: bilateral knee pain (under evaluation for bilateral TKR) - using crutches Skin: no rashes, no hyperemia, multiple nonhealing wounds to BLE Neurological: no tremors, no numbness, no tingling, + intermittent dizziness Psychiatric: no depression, no anxiety   Objective:    BP 132/82    Pulse 91    Ht 5\' 9"  (1.753 m)    Wt 248 lb (112.5 kg)    BMI 36.62 kg/m   Wt Readings from Last 3 Encounters:  08/07/21 248 lb (112.5 kg)  03/31/21 254 lb 9.6 oz (115.5 kg)  02/24/21 256 lb 3.2 oz (116.2 kg)    BP Readings from Last 3 Encounters:  08/07/21 132/82  03/31/21 127/70  02/24/21 114/68    Physical Exam- Limited  Constitutional:  Body mass index is 36.62 kg/m. , not in acute distress, normal state of mind Eyes:  EOMI, no exophthalmos Neck: Supple Cardiovascular: RRR, no murmurs, rubs, or gallops, no edema Respiratory: SOB at rest with chronic congested cough, no crackles, rales, rhonchi, or wheezing Musculoskeletal: no gross deformities,  strength intact in all four extremities, + walks with crutches due to bilateral knee arthritis Skin:  no rashes, no hyperemia Neurological: no tremor with outstretched hands     Lipid Panel     Component Value Date/Time   CHOL 157 06/25/2020 0000   TRIG 155 06/25/2020 0000   HDL 44 06/25/2020 0000   LDLCALC 86 06/25/2020 0000   Recent Results (from the past 2160 hour(s))  CUP PACEART REMOTE DEVICE CHECK     Status: None   Collection Time: 06/11/21 10:41 PM  Result Value Ref Range   Date Time Interrogation Session [id number]    Pulse Generator Manufacturer MERM    Pulse Gen Model [id number] Advisa DR MRI    Pulse Gen Serial Number [id number]    Clinic Name [facility]    Implantable Pulse Generator Type Implantable Pulse Generator    Implantable Pulse Generator Implant Date [id number]    Implantable Lead Manufacturer MERM    Implantable Lead Model 5076 CapSureFix Novus    Implantable Lead Serial Number [id number]    Implantable Lead Implant Date [id number]    Implantable Lead Location Detail 1 UNKNOWN    Implantable Lead Location [id number]    Implantable Lead Manufacturer MERM    Implantable Lead Model 5076 CapSureFix Novus    Implantable Lead Serial Number [id number]    Implantable Lead Implant Date [id number]    Implantable Lead Location Detail 1 UNKNOWN    Implantable Lead Location [id number]    Lead Channel Setting Sensing Sensitivity 2.8 mV   Lead Channel Setting Pacing Pulse Width 0.4 ms   Lead Channel Setting Pacing Amplitude 2.5 V   Lead Channel Impedance Value 456 ohm   Lead Channel Impedance Value 342 ohm   Lead Channel Sensing Intrinsic Amplitude 2.25 mV   Lead Channel Sensing Intrinsic Amplitude 2.25 mV   Lead Channel Pacing Threshold Amplitude 0.5 V   Lead Channel Pacing Threshold Pulse Width 0.4 ms   Lead Channel Impedance Value 437 ohm   Lead Channel Impedance Value 361 ohm   Lead Channel Sensing Intrinsic Amplitude 11.125 mV   Lead Channel Sensing Intrinsic  Amplitude 11.125 mV   Lead Channel Pacing Threshold  Amplitude 1 V   Lead Channel Pacing Threshold Pulse Width 0.4 ms   Battery Status OK    Battery Remaining Longevity 18 mo   Battery Voltage 2.91 V   Brady Statistic RA Percent Paced 0 %   Brady Statistic RV Percent Paced 90.6 %   CMP Latest Ref Rng & Units 06/25/2020 03/15/2020 06/08/2019  Glucose 70 - 99 mg/dL - - -  BUN 4 - 21 25(A) 22(A) 20  Creatinine 0.6 - 1.3 1.6(A) 2.2(A) 1.6(A)  Sodium 137 - 147 - - 138  Potassium 3.4 - 5.3 - 5.0 5.2  Chloride 99 - 108 - - 102  CO2 13 - 22 - - 25(A)  Calcium 8.7 - 10.7 9.2 9.3 9.4  Total Protein 6.5 - 8.1 g/dL - - -  Total Bilirubin 0.3 - 1.2 mg/dL - - -  Alkaline Phos 25 - 125 - - 81  AST 14 - 40 - - 21  ALT 10 - 40 - - 38     Assessment & Plan:   1) Type 2 diabetes mellitus with stage 3 chronic kidney disease, without long-term current use of insulin (HCC)  - Patient has currently uncontrolled symptomatic type 2 DM since 76 years of age.  He presents today, accompanied by his wife, with his CGM and logs showing stable, at goal glycemic profile overall.  His POCT A1c today is 7.4%, increasing from last visit of 6.8%, yet still a good target for him given his age and comorbidities.  He denies any significant hypoglycemia.  Analysis of his CGM shows TIR 62%, TAR 38%, TBR 0%.  -his diabetes is complicated by stage 3 renal insufficiency (improving), obesity/sedentary life, neuropathy, cardiomyopathy, chronic heavy smoking, inadequate insurance and Dylan Tyler remains at a high risk for more acute and chronic complications which include CAD, CVA, CKD, retinopathy, and neuropathy. These are all discussed in detail with the patient.  - Nutritional counseling repeated at each appointment due to patients tendency to fall back in to old habits.  - The patient admits there is a room for improvement in their diet and drink choices. -  Suggestion is made for the patient to avoid simple  carbohydrates from their diet including Cakes, Sweet Desserts / Pastries, Ice Cream, Soda (diet and regular), Sweet Tea, Candies, Chips, Cookies, Sweet Pastries, Store Bought Juices, Alcohol in Excess of 1-2 drinks a day, Artificial Sweeteners, Coffee Creamer, and "Sugar-free" Products. This will help patient to have stable blood glucose profile and potentially avoid unintended weight gain.   - I encouraged the patient to switch to unprocessed or minimally processed complex starch and increased protein intake (animal or plant source), fruits, and vegetables.   - Patient is advised to stick to a routine mealtimes to eat 3 meals a day and avoid unnecessary snacks (to snack only to correct hypoglycemia).  - I have approached him with the following individualized plan to manage diabetes and patient agrees:   - Based on his glucose profile, he will continue to need intensive treatment with higher dose of basal/bolus insulin in order for him to achieve control of diabetes to target.    -Based on stable, at goal glycemic profile, no changes will be made to his medications today.  He is advised to continue his Lantus 80 units SQ nightly, continue Novolog 25-31 units TID with meals if glucose is above 90 and he is eating (Specific instructions on how to titrate insulin dosage based on glucose readings given to patient in  writing).   -He is encouraged to continue using his CGM to monitor blood glucose 4 times per day, before meals and at bedtime and report to the clinic if blood glucose levels are less than 70 or greater than 200 for 3 tests in a row.  - He is not a candidate for metformin therapy due to CKD.  - He is not a candidate for incretin therapy (such as Ozempic) due to increased risk of pancreatitis r/t hypertriglyceridemia and heavy smoking history.  2) Lipids/HPL:  His recent lipid panel from 06/25/20 shows controlled LDL of 86 and improved triglycerides of 155.  He is advised to continue  Cholestyramine 4 g TID with meals and Simvastatin 40 mg po daily at bedtime.  Side effects and precautions discussed with him.  Will recheck lipid panel prior to next visit.  3) Hypertension His blood pressure is controlled to target for his age.  He is advised to continue Coreg 6.25 mg po twice daily, Lasix 40 mg po every other day (alternating with 20 mg), and Entresto 24-26 mg po twice daily.   4) Chronic Care/Health Maintenance: -he is on ACEI/ARB and Statin medications and  is encouraged to continue to follow up with Ophthalmology, Dentist,  Podiatrist at least yearly or according to recommendations. - I have recommended yearly flu vaccine and pneumonia vaccination at least every 5 years; and  sleep for at least 7 hours a day.  The patient was counseled on the dangers of tobacco use, and was advised to quit.  Reviewed strategies to maximize success, including removing cigarettes and smoking materials from environment.  5) Weight management:  His Body mass index is 36.62 kg/m. He is a candidate for modest weight loss.  Exercise regimen and carbs restrictions were detailed with him.   - I advised patient to maintain close follow up with Glenda Chroman, MD for primary care needs.      I spent 49 minutes in the care of the patient today including review of labs from [facility], Lipids, Thyroid Function, Hematology (current and previous including abstractions from other facilities); face-to-face time discussing  his blood glucose readings/logs, discussing hypoglycemia and hyperglycemia episodes and symptoms, medications doses, his options of short and long term treatment based on the latest standards of care / guidelines;  discussion about incorporating lifestyle medicine;  and documenting the encounter.    Please refer to Patient Instructions for Blood Glucose Monitoring and Insulin/Medications Dosing Guide"  in media tab for additional information. Please  also refer to " Patient Self Inventory" in  the Media  tab for reviewed elements of pertinent patient history.  [name] participated in the discussions, expressed understanding, and voiced agreement with the above plans.  All questions were answered to his satisfaction. he is encouraged to contact clinic should he have any questions or concerns prior to his return visit.   Follow up plan: - Return in about 4 months (around 12/05/2021) for Diabetes F/U with A1c in office, Bring meter and logs, Previsit labs.    Rayetta Pigg, [facility] [address] [place], [place] [address] Phone: [phone] Fax: [phone]   08/07/2021, 1:57 PM

## 2021-08-12 ENCOUNTER — Other Ambulatory Visit: Payer: Self-pay | Admitting: Cardiology

## 2021-09-01 DIAGNOSIS — I25119 Atherosclerotic heart disease of native coronary artery with unspecified angina pectoris: Secondary | ICD-10-CM | POA: Diagnosis not present

## 2021-09-01 DIAGNOSIS — J069 Acute upper respiratory infection, unspecified: Secondary | ICD-10-CM | POA: Diagnosis not present

## 2021-09-01 DIAGNOSIS — M459 Ankylosing spondylitis of unspecified sites in spine: Secondary | ICD-10-CM | POA: Diagnosis not present

## 2021-09-01 DIAGNOSIS — Z299 Encounter for prophylactic measures, unspecified: Secondary | ICD-10-CM | POA: Diagnosis not present

## 2021-09-01 DIAGNOSIS — E1165 Type 2 diabetes mellitus with hyperglycemia: Secondary | ICD-10-CM | POA: Diagnosis not present

## 2021-09-01 DIAGNOSIS — F17210 Nicotine dependence, cigarettes, uncomplicated: Secondary | ICD-10-CM | POA: Diagnosis not present

## 2021-09-06 ENCOUNTER — Other Ambulatory Visit: Payer: Self-pay | Admitting: Nurse Practitioner

## 2021-09-08 ENCOUNTER — Other Ambulatory Visit: Payer: Self-pay | Admitting: "Endocrinology

## 2021-09-08 DIAGNOSIS — E1122 Type 2 diabetes mellitus with diabetic chronic kidney disease: Secondary | ICD-10-CM

## 2021-09-09 ENCOUNTER — Other Ambulatory Visit: Payer: Self-pay | Admitting: Nurse Practitioner

## 2021-09-09 ENCOUNTER — Other Ambulatory Visit: Payer: Self-pay | Admitting: Internal Medicine

## 2021-09-09 ENCOUNTER — Other Ambulatory Visit: Payer: Self-pay | Admitting: Cardiology

## 2021-09-09 NOTE — Telephone Encounter (Signed)
Prescription refill request for Eliquis received. ?Indication: Atrial fib ?Last office visit: 02/24/21  Dylan Cousins FNP ?Scr: 1.52 on 06/11/21 ?Age: 76 ?Weight: 116.2kg ? ?Based on above findings Eliquis '5mg'$  twice daily is the appropriate dose.  Refill approved. ? ?

## 2021-09-11 ENCOUNTER — Ambulatory Visit (INDEPENDENT_AMBULATORY_CARE_PROVIDER_SITE_OTHER): Payer: PPO

## 2021-09-11 DIAGNOSIS — Z299 Encounter for prophylactic measures, unspecified: Secondary | ICD-10-CM | POA: Diagnosis not present

## 2021-09-11 DIAGNOSIS — F419 Anxiety disorder, unspecified: Secondary | ICD-10-CM | POA: Diagnosis not present

## 2021-09-11 DIAGNOSIS — Z79899 Other long term (current) drug therapy: Secondary | ICD-10-CM | POA: Diagnosis not present

## 2021-09-11 DIAGNOSIS — R001 Bradycardia, unspecified: Secondary | ICD-10-CM

## 2021-09-11 DIAGNOSIS — M456 Ankylosing spondylitis lumbar region: Secondary | ICD-10-CM | POA: Diagnosis not present

## 2021-09-11 DIAGNOSIS — F17210 Nicotine dependence, cigarettes, uncomplicated: Secondary | ICD-10-CM | POA: Diagnosis not present

## 2021-09-11 DIAGNOSIS — I10 Essential (primary) hypertension: Secondary | ICD-10-CM | POA: Diagnosis not present

## 2021-09-15 LAB — CUP PACEART REMOTE DEVICE CHECK
Battery Remaining Longevity: 14 mo
Battery Voltage: 2.9 V
Brady Statistic RA Percent Paced: 0 %
Brady Statistic RV Percent Paced: 90.11 %
Date Time Interrogation Session: 20230311120342
Implantable Lead Implant Date: 20161115
Implantable Lead Implant Date: 20161115
Implantable Lead Location: 753859
Implantable Lead Location: 753860
Implantable Lead Model: 5076
Implantable Lead Model: 5076
Implantable Pulse Generator Implant Date: 20161115
Lead Channel Impedance Value: 342 Ohm
Lead Channel Impedance Value: 361 Ohm
Lead Channel Impedance Value: 437 Ohm
Lead Channel Impedance Value: 475 Ohm
Lead Channel Pacing Threshold Amplitude: 0.5 V
Lead Channel Pacing Threshold Amplitude: 1 V
Lead Channel Pacing Threshold Pulse Width: 0.4 ms
Lead Channel Pacing Threshold Pulse Width: 0.4 ms
Lead Channel Sensing Intrinsic Amplitude: 2.75 mV
Lead Channel Sensing Intrinsic Amplitude: 2.75 mV
Lead Channel Sensing Intrinsic Amplitude: 8.25 mV
Lead Channel Sensing Intrinsic Amplitude: 8.25 mV
Lead Channel Setting Pacing Amplitude: 2.5 V
Lead Channel Setting Pacing Pulse Width: 0.4 ms
Lead Channel Setting Sensing Sensitivity: 2.8 mV

## 2021-09-24 NOTE — Progress Notes (Signed)
Remote pacemaker transmission.   

## 2021-09-25 DIAGNOSIS — I129 Hypertensive chronic kidney disease with stage 1 through stage 4 chronic kidney disease, or unspecified chronic kidney disease: Secondary | ICD-10-CM | POA: Diagnosis not present

## 2021-09-25 DIAGNOSIS — E1122 Type 2 diabetes mellitus with diabetic chronic kidney disease: Secondary | ICD-10-CM | POA: Diagnosis not present

## 2021-09-25 DIAGNOSIS — D472 Monoclonal gammopathy: Secondary | ICD-10-CM | POA: Diagnosis not present

## 2021-09-25 DIAGNOSIS — E6609 Other obesity due to excess calories: Secondary | ICD-10-CM | POA: Diagnosis not present

## 2021-09-25 DIAGNOSIS — I5022 Chronic systolic (congestive) heart failure: Secondary | ICD-10-CM | POA: Diagnosis not present

## 2021-09-25 DIAGNOSIS — N189 Chronic kidney disease, unspecified: Secondary | ICD-10-CM | POA: Diagnosis not present

## 2021-10-02 DIAGNOSIS — I10 Essential (primary) hypertension: Secondary | ICD-10-CM | POA: Diagnosis not present

## 2021-10-02 DIAGNOSIS — E785 Hyperlipidemia, unspecified: Secondary | ICD-10-CM | POA: Diagnosis not present

## 2021-11-04 ENCOUNTER — Ambulatory Visit (INDEPENDENT_AMBULATORY_CARE_PROVIDER_SITE_OTHER): Payer: PPO | Admitting: Cardiology

## 2021-11-04 ENCOUNTER — Encounter: Payer: Self-pay | Admitting: Cardiology

## 2021-11-04 VITALS — BP 140/62 | HR 79 | Ht 69.0 in | Wt 243.4 lb

## 2021-11-04 DIAGNOSIS — E6609 Other obesity due to excess calories: Secondary | ICD-10-CM | POA: Diagnosis not present

## 2021-11-04 DIAGNOSIS — I429 Cardiomyopathy, unspecified: Secondary | ICD-10-CM | POA: Diagnosis not present

## 2021-11-04 DIAGNOSIS — E1122 Type 2 diabetes mellitus with diabetic chronic kidney disease: Secondary | ICD-10-CM | POA: Diagnosis not present

## 2021-11-04 DIAGNOSIS — N189 Chronic kidney disease, unspecified: Secondary | ICD-10-CM | POA: Diagnosis not present

## 2021-11-04 DIAGNOSIS — N186 End stage renal disease: Secondary | ICD-10-CM | POA: Diagnosis not present

## 2021-11-04 DIAGNOSIS — N1832 Chronic kidney disease, stage 3b: Secondary | ICD-10-CM

## 2021-11-04 DIAGNOSIS — I4821 Permanent atrial fibrillation: Secondary | ICD-10-CM | POA: Diagnosis not present

## 2021-11-04 NOTE — Patient Instructions (Addendum)

## 2021-11-04 NOTE — Progress Notes (Signed)
? ? ?Cardiology Office Note ? ?Date: 11/04/2021  ? ?ID: Dylan Tyler, DOB 01-Oct-1945, MRN [id number] ? ?PCP:  Glenda Chroman, MD  ?Cardiologist:  Rozann Lesches, MD ?Electrophysiologist:  Thompson Grayer, MD  ? ?Chief Complaint  ?Patient presents with  ? Cardiac follow-up  ? ? ?History of Present Illness: ?Dylan Tyler is a 76 y.o. male last seen in August 2022 by Mr. Leonides Sake NP.  He is here for a routine visit.  Reports no palpitations and stable NYHA class II-III dyspnea on exertion.  He has chronic leg pain which limits his activity, he is using a crutch today. ? ?Medtronic pacemaker in place with follow-up by Dr. [name].  Device check indicated normal function with underlying permanent atrial fibrillation. ? ?Follow-up echocardiogram in July 2022 revealed LVEF approximately 50% with moderate LVH, normal RV contraction, and moderate left atrial enlargement. ? ?We went over his medications.  He also had recent lab work which I reviewed. ? ?Past Medical History:  ?Diagnosis Date  ? Ankylosing spondylitis ([place])   ? Anxiety   ? Aortic root dilatation ([facility])   ? AR (aortic regurgitation)   ? Atrial fibrillation and flutter ([place])   ? Bladder cancer ([place])   ? Chronic pain   ? Followed at [facility]  ? COPD (chronic obstructive pulmonary disease) ([place])   ? DDD (degenerative disc disease), lumbosacral   ? DDD (degenerative disc disease), thoracolumbar   ? Degenerative cervical disc   ? Depression   ? Diabetes mellitus type II   ? Diverticulitis   ? Essential hypertension   ? GERD (gastroesophageal reflux disease)   ? History of kidney stones   ? Hyperlipidemia   ? Migraine   ? Nonischemic cardiomyopathy ([place])   ? Cardiolite 12/09 LVEF 55%, minor luminal irregularities at cardiac catheterization  ? OSA on CPAP   ? Rheumatoid arthritis ([place])   ? Symptomatic bradycardia   ? Medtronic Advisa L dual-chamber pacemaker  05/2015 - Dr. Curt Bears  ? ? ?Past Surgical History:  ?Procedure Laterality Date  ? CARDIAC CATHETERIZATION  08/13/11  ?  CARDIOVERSION N/A 07/02/2015  ? Procedure: CARDIOVERSION;  Surgeon: Sanda Klein, MD;  Location: [facility] ENDOSCOPY;  Service: Cardiovascular;  Laterality: N/A;  ? CATARACT EXTRACTION W/PHACO Left 02/10/2019  ? Procedure: CATARACT EXTRACTION PHACO AND INTRAOCULAR LENS PLACEMENT (IOC);  Surgeon: Baruch Goldmann, MD;  Location: [facility] ORS;  Service: Ophthalmology;  Laterality: Left;  CDE: 7.10  ? CATARACT EXTRACTION W/PHACO Right 02/27/2019  ? Procedure: CATARACT EXTRACTION PHACO AND INTRAOCULAR LENS PLACEMENT RIGHT EYE CDE=11.84;  Surgeon: Baruch Goldmann, MD;  Location: [facility] ORS;  Service: Ophthalmology;  Laterality: Right;  right  ? CYSTOSCOPY WITH HOLMIUM LASER LITHOTRIPSY  1990's  ? EP IMPLANTABLE DEVICE N/A 05/21/2015  ? MDT Advisa DR MRI pacemaker implanted by Dr Curt Bears  ? KNEE ARTHROSCOPY Right ~ 1991  ? LAPAROSCOPIC CHOLECYSTECTOMY  ~ 2008  ? TRANSURETHRAL RESECTION OF BLADDER TUMOR WITH GYRUS (TURBT-GYRUS)  1990's?  ? ? ?Current Outpatient Medications  ?Medication Sig Dispense Refill  ? acetaminophen (TYLENOL) 500 MG tablet Take 500 mg by mouth every 6 (six) hours as needed (pain).     ? albuterol (PROVENTIL) (2.5 MG/3ML) 0.083% nebulizer solution Take 2.5 mg by nebulization every 6 (six) hours as needed for wheezing.    ? albuterol (VENTOLIN HFA) 108 (90 Base) MCG/ACT inhaler SMARTSIG:1-2 Puff(s) Via Inhaler Every 4-6 Hours PRN    ? Alcohol Swabs 70 % PADS Use as directed 120 each 2  ? ALPRAZolam (  XANAX) 0.5 MG tablet Take 0.5 mg by mouth 2 (two) times daily as needed.  1  ? BD PEN NEEDLE NANO U/F 32G X 4 MM MISC USE AS DIRECTED FOUR TIMES DAILY 200 each 5  ? calcitRIOL (ROCALTROL) 0.25 MCG capsule Take 0.25 mcg by mouth 3 (three) times a week.    ? carvedilol (COREG) 6.25 MG tablet TAKE ONE TABLET BY MOUTH TWICE DAILY WITH MEALS 180 tablet 3  ? Cholecalciferol (VITAMIN D-3) 1000 UNITS CAPS Take 1,000 Units by mouth 2 (two) times daily.     ? cholestyramine light (PREVALITE) 4 g packet SMARTSIG:1 Packet(s) By Mouth Twice  Daily PRN    ? citalopram (CELEXA) 40 MG tablet Take 20-40 mg by mouth daily.     ? Continuous Blood Gluc Receiver (FREESTYLE LIBRE 14 DAY READER) DEVI USE AS DIRECTED. 1 each 0  ? Continuous Blood Gluc Sensor (FREESTYLE LIBRE 14 DAY SENSOR) MISC APPLY ONE SENSOR EVERY 14 DAYS AS DIRECTED 2 each 2  ? ELIQUIS 5 MG TABS tablet TAKE ONE TABLET BY MOUTH TWICE DAILY 60 tablet 6  ? ENTRESTO 24-26 MG TAKE ONE TABLET BY MOUTH TWICE DAILY 60 tablet 3  ? furosemide (LASIX) 20 MG tablet Take by mouth.    ? furosemide (LASIX) 40 MG tablet Take 40 mg by mouth every other day. Alternating with '60mg'$  every other day.    ? gabapentin (NEURONTIN) 400 MG capsule Take 400 mg by mouth 3 (three) times daily.     ? IBU 800 MG tablet Take 800 mg by mouth every 6 (six) hours as needed.    ? insulin glargine (LANTUS SOLOSTAR) 100 UNIT/ML Solostar Pen Inject 80 Units into the skin at bedtime. 30 mL 3  ? Insulin Pen Needle (B-D ULTRAFINE III SHORT PEN) 31G X 8 MM MISC Use to inject insulin 4 x daily 200 each 3  ? ipratropium-albuterol (DUONEB) 0.5-2.5 (3) MG/3ML SOLN Take 3 mLs by nebulization every 4 (four) hours as needed (for shortness of breath).    ? Lancets (FREESTYLE) lancets USE TO CHECK BLOOD SUGAR FOUR TIMES DAILY. 200 each 5  ? magnesium oxide (MAG-OX) 400 (240 Mg) MG tablet Take 1 tablet by mouth daily.    ? metoCLOPramide (REGLAN) 5 MG tablet Take 5 mg by mouth every 6 (six) hours as needed for nausea or vomiting.    ? nitroGLYCERIN (NITROSTAT) 0.4 MG SL tablet Place 1 tablet (0.4 mg total) under the tongue every 5 (five) minutes as needed for chest pain (if no relief after 2nd dose, proceed to the ED for an evaluation or call 911). 25 tablet 3  ? NON FORMULARY 1 each by Other route See admin instructions. CPAP - Uses nightly    ? NOVOLOG FLEXPEN 100 UNIT/ML FlexPen INJECT 25-31 UNITS SUBCUTANEOUSLY THREE TIMES DAILY AS DIRECTED WITH MEALS 90 mL 0  ? simvastatin (ZOCOR) 40 MG tablet TAKE ONE TABLET BY MOUTH AT BEDTIME 90 tablet 3   ? traMADol (ULTRAM) 50 MG tablet Take 1 tablet by mouth daily as needed (pain).     ? TRELEGY ELLIPTA 100-62.5-25 MCG/INH AEPB Inhale 1 puff into the lungs daily.    ? triamcinolone cream (KENALOG) 0.1 % Apply 1 application topically 2 (two) times daily as needed.    ? vitamin B-12 (CYANOCOBALAMIN) 1000 MCG tablet Take 1,000 mcg by mouth 2 (two) times daily.    ? VOLTAREN 1 % GEL Apply 2 g topically 4 (four) times daily as needed (for pain).     ? ?  No current facility-administered medications for this visit.  ? ?Allergies:  Patient has no known allergies.  ? ?ROS: No orthopnea or PND. ? ?Physical Exam: ?VS:  BP 140/62   Pulse 79   Ht '5\' 9"'$  (1.753 m)   Wt 243 lb 7.2 oz (110.4 kg)   SpO2 98%   BMI 35.95 kg/m? , BMI Body mass index is 35.95 kg/m?. ? ?Wt Readings from Last 3 Encounters:  ?11/04/21 243 lb 7.2 oz (110.4 kg)  ?08/07/21 248 lb (112.5 kg)  ?03/31/21 254 lb 9.6 oz (115.5 kg)  ?  ?General: Patient appears comfortable at rest. ?HEENT: Conjunctiva and lids normal, oropharynx clear. ?Neck: Supple, no elevated JVP or carotid bruits, no thyromegaly. ?Lungs: Clear to auscultation, nonlabored breathing at rest. ?Cardiac: Irregularly irregular, no S3, 1/6 systolic murmur, no pericardial rub. ?Extremities: Mild ankle edema. ? ?ECG:  An ECG dated 01/03/2021 was personally reviewed today and demonstrated:  Ventricular pacing with PVC. ? ?Recent Labwork: ?   ?Component Value Date/Time  ? CHOL 157 06/25/2020 0000  ? TRIG 155 06/25/2020 0000  ? HDL 44 06/25/2020 0000  ? [place] 86 06/25/2020 0000  ?May 2022: Potassium 4.9, BUN 24, creatinine 1.84, AST 17, ALT 38 ?February 2023: Hemoglobin A1c 7.4% ?May 2023: Hemoglobin 17.0, platelets 142, potassium 5.2, BUN 22, creatinine 1.76 ? ?Other Studies Reviewed Today: ? ?Echocardiogram 01/16/2021: ? 1. Left ventricular ejection fraction, by estimation, is 50%. The left  ?ventricle has low normal function. The left ventricle has no regional wall  ?motion abnormalities. There is  moderate left ventricular hypertrophy. Left  ?ventricular diastolic parameters  ? are indeterminate.  ? 2. Right ventricular systolic function is normal. The right ventricular  ?size is normal. There is normal

## 2021-11-08 DIAGNOSIS — I129 Hypertensive chronic kidney disease with stage 1 through stage 4 chronic kidney disease, or unspecified chronic kidney disease: Secondary | ICD-10-CM | POA: Diagnosis not present

## 2021-11-08 DIAGNOSIS — E6609 Other obesity due to excess calories: Secondary | ICD-10-CM | POA: Diagnosis not present

## 2021-11-08 DIAGNOSIS — E1122 Type 2 diabetes mellitus with diabetic chronic kidney disease: Secondary | ICD-10-CM | POA: Diagnosis not present

## 2021-11-08 DIAGNOSIS — D472 Monoclonal gammopathy: Secondary | ICD-10-CM | POA: Diagnosis not present

## 2021-11-08 DIAGNOSIS — I5022 Chronic systolic (congestive) heart failure: Secondary | ICD-10-CM | POA: Diagnosis not present

## 2021-11-08 DIAGNOSIS — N189 Chronic kidney disease, unspecified: Secondary | ICD-10-CM | POA: Diagnosis not present

## 2021-11-08 DIAGNOSIS — E211 Secondary hyperparathyroidism, not elsewhere classified: Secondary | ICD-10-CM | POA: Diagnosis not present

## 2021-11-18 DIAGNOSIS — E785 Hyperlipidemia, unspecified: Secondary | ICD-10-CM | POA: Diagnosis not present

## 2021-11-18 DIAGNOSIS — M25562 Pain in left knee: Secondary | ICD-10-CM | POA: Diagnosis not present

## 2021-11-18 DIAGNOSIS — M25561 Pain in right knee: Secondary | ICD-10-CM | POA: Diagnosis not present

## 2021-11-18 DIAGNOSIS — G4733 Obstructive sleep apnea (adult) (pediatric): Secondary | ICD-10-CM | POA: Diagnosis not present

## 2021-11-18 DIAGNOSIS — D6869 Other thrombophilia: Secondary | ICD-10-CM | POA: Diagnosis not present

## 2021-11-18 DIAGNOSIS — I10 Essential (primary) hypertension: Secondary | ICD-10-CM | POA: Diagnosis not present

## 2021-11-18 DIAGNOSIS — J449 Chronic obstructive pulmonary disease, unspecified: Secondary | ICD-10-CM | POA: Diagnosis not present

## 2021-11-18 DIAGNOSIS — I739 Peripheral vascular disease, unspecified: Secondary | ICD-10-CM | POA: Diagnosis not present

## 2021-11-18 DIAGNOSIS — I4821 Permanent atrial fibrillation: Secondary | ICD-10-CM | POA: Diagnosis not present

## 2021-11-18 DIAGNOSIS — D692 Other nonthrombocytopenic purpura: Secondary | ICD-10-CM | POA: Diagnosis not present

## 2021-11-18 DIAGNOSIS — I25118 Atherosclerotic heart disease of native coronary artery with other forms of angina pectoris: Secondary | ICD-10-CM | POA: Diagnosis not present

## 2021-11-24 ENCOUNTER — Other Ambulatory Visit: Payer: PPO

## 2021-11-25 ENCOUNTER — Encounter: Payer: Self-pay | Admitting: Cardiology

## 2021-12-03 DIAGNOSIS — Z794 Long term (current) use of insulin: Secondary | ICD-10-CM | POA: Diagnosis not present

## 2021-12-03 DIAGNOSIS — E1122 Type 2 diabetes mellitus with diabetic chronic kidney disease: Secondary | ICD-10-CM | POA: Diagnosis not present

## 2021-12-03 DIAGNOSIS — N186 End stage renal disease: Secondary | ICD-10-CM | POA: Diagnosis not present

## 2021-12-03 LAB — LIPID PANEL
Cholesterol: 101 (ref 0–200)
HDL: 32 — AB (ref 35–70)
LDL Cholesterol: 46
Triglycerides: 113 (ref 40–160)

## 2021-12-03 LAB — COMPREHENSIVE METABOLIC PANEL
Albumin: 3.2 — AB (ref 3.5–5.0)
Calcium: 8.8 (ref 8.7–10.7)

## 2021-12-03 LAB — BASIC METABOLIC PANEL
BUN: 13 (ref 4–21)
CO2: 28 — AB (ref 13–22)
Chloride: 102 (ref 99–108)
Creatinine: 1.7 — AB (ref 0.6–1.3)
Glucose: 231
Potassium: 4.7 mEq/L (ref 3.5–5.1)
Sodium: 137 (ref 137–147)

## 2021-12-03 LAB — HEPATIC FUNCTION PANEL
ALT: 20 U/L (ref 10–40)
AST: 18 (ref 14–40)
Alkaline Phosphatase: 74 (ref 25–125)
Bilirubin, Total: 0.6

## 2021-12-03 LAB — TSH: TSH: 1.98 (ref 0.41–5.90)

## 2021-12-08 ENCOUNTER — Encounter: Payer: PPO | Admitting: Nurse Practitioner

## 2021-12-08 VITALS — Ht 69.0 in | Wt 243.0 lb

## 2021-12-08 NOTE — Progress Notes (Signed)
Erroneous encounter

## 2021-12-08 NOTE — Patient Instructions (Signed)
Diabetes Mellitus and Foot Care Foot care is an important part of your health, especially when you have diabetes. Diabetes may cause you to have problems because of poor blood flow (circulation) to your feet and legs, which can cause your skin to: Become thinner and drier. Break more easily. Heal more slowly. Peel and crack. You may also have nerve damage (neuropathy) in your legs and feet, causing decreased feeling in them. This means that you may not notice minor injuries to your feet that could lead to more serious problems. Noticing and addressing any potential problems early is the best way to prevent future foot problems. How to care for your feet Foot hygiene  Wash your feet daily with warm water and mild soap. Do not use hot water. Then, pat your feet and the areas between your toes until they are completely dry. Do not soak your feet as this can dry your skin. Trim your toenails straight across. Do not dig under them or around the cuticle. File the edges of your nails with an emery board or nail file. Apply a moisturizing lotion or petroleum jelly to the skin on your feet and to dry, brittle toenails. Use lotion that does not contain alcohol and is unscented. Do not apply lotion between your toes. Shoes and socks Wear clean socks or stockings every day. Make sure they are not too tight. Do not wear knee-high stockings since they may decrease blood flow to your legs. Wear shoes that fit properly and have enough cushioning. Always look in your shoes before you put them on to be sure there are no objects inside. To break in new shoes, wear them for just a few hours a day. This prevents injuries on your feet. Wounds, scrapes, corns, and calluses  Check your feet daily for blisters, cuts, bruises, sores, and redness. If you cannot see the bottom of your feet, use a mirror or ask someone for help. Do not cut corns or calluses or try to remove them with medicine. If you find a minor scrape,  cut, or break in the skin on your feet, keep it and the skin around it clean and dry. You may clean these areas with mild soap and water. Do not clean the area with peroxide, alcohol, or iodine. If you have a wound, scrape, corn, or callus on your foot, look at it several times a day to make sure it is healing and not infected. Check for: Redness, swelling, or pain. Fluid or blood. Warmth. Pus or a bad smell. General tips Do not cross your legs. This may decrease blood flow to your feet. Do not use heating pads or hot water bottles on your feet. They may burn your skin. If you have lost feeling in your feet or legs, you may not know this is happening until it is too late. Protect your feet from hot and cold by wearing shoes, such as at the beach or on hot pavement. Schedule a complete foot exam at least once a year (annually) or more often if you have foot problems. Report any cuts, sores, or bruises to your health care provider immediately. Where to find more information American Diabetes Association: www.diabetes.org Association of Diabetes Care & Education Specialists: www.diabeteseducator.org Contact a health care provider if: You have a medical condition that increases your risk of infection and you have any cuts, sores, or bruises on your feet. You have an injury that is not healing. You have redness on your legs or feet. You   feel burning or tingling in your legs or feet. You have pain or cramps in your legs and feet. Your legs or feet are numb. Your feet always feel cold. You have pain around any toenails. Get help right away if: You have a wound, scrape, corn, or callus on your foot and: You have pain, swelling, or redness that gets worse. You have fluid or blood coming from the wound, scrape, corn, or callus. Your wound, scrape, corn, or callus feels warm to the touch. You have pus or a bad smell coming from the wound, scrape, corn, or callus. You have a fever. You have a red  line going up your leg. Summary Check your feet every day for blisters, cuts, bruises, sores, and redness. Apply a moisturizing lotion or petroleum jelly to the skin on your feet and to dry, brittle toenails. Wear shoes that fit properly and have enough cushioning. If you have foot problems, report any cuts, sores, or bruises to your health care provider immediately. Schedule a complete foot exam at least once a year (annually) or more often if you have foot problems. This information is not intended to replace advice given to you by your health care provider. Make sure you discuss any questions you have with your health care provider. Document Revised: 01/11/2020 Document Reviewed: 01/11/2020 Elsevier Patient Education  2023 [employer]  

## 2021-12-09 ENCOUNTER — Ambulatory Visit (INDEPENDENT_AMBULATORY_CARE_PROVIDER_SITE_OTHER): Payer: PPO

## 2021-12-09 ENCOUNTER — Other Ambulatory Visit: Payer: Self-pay | Admitting: Cardiology

## 2021-12-09 ENCOUNTER — Other Ambulatory Visit: Payer: Self-pay | Admitting: Nurse Practitioner

## 2021-12-09 DIAGNOSIS — I429 Cardiomyopathy, unspecified: Secondary | ICD-10-CM

## 2021-12-10 LAB — ECHOCARDIOGRAM COMPLETE
AR max vel: 2.62 cm2
AV Area VTI: 2.66 cm2
AV Area mean vel: 2.32 cm2
AV Mean grad: 3.3 mmHg
AV Peak grad: 5.3 mmHg
AV Vena cont: 0.38 cm
Ao pk vel: 1.15 m/s
Area-P 1/2: 4.86 cm2
Calc EF: 50 %
MV M vel: 2.53 m/s
MV Peak grad: 25.6 mmHg
P 1/2 time: 1525 msec
S' Lateral: 3.69 cm
Single Plane A2C EF: 51.5 %
Single Plane A4C EF: 50.2 %

## 2021-12-11 ENCOUNTER — Ambulatory Visit (INDEPENDENT_AMBULATORY_CARE_PROVIDER_SITE_OTHER): Payer: PPO

## 2021-12-11 DIAGNOSIS — I10 Essential (primary) hypertension: Secondary | ICD-10-CM | POA: Diagnosis not present

## 2021-12-11 DIAGNOSIS — F17210 Nicotine dependence, cigarettes, uncomplicated: Secondary | ICD-10-CM | POA: Diagnosis not present

## 2021-12-11 DIAGNOSIS — I429 Cardiomyopathy, unspecified: Secondary | ICD-10-CM | POA: Diagnosis not present

## 2021-12-11 DIAGNOSIS — Z299 Encounter for prophylactic measures, unspecified: Secondary | ICD-10-CM | POA: Diagnosis not present

## 2021-12-11 DIAGNOSIS — I4891 Unspecified atrial fibrillation: Secondary | ICD-10-CM | POA: Diagnosis not present

## 2021-12-11 DIAGNOSIS — J449 Chronic obstructive pulmonary disease, unspecified: Secondary | ICD-10-CM | POA: Diagnosis not present

## 2021-12-11 DIAGNOSIS — F419 Anxiety disorder, unspecified: Secondary | ICD-10-CM | POA: Diagnosis not present

## 2021-12-12 ENCOUNTER — Other Ambulatory Visit: Payer: Self-pay | Admitting: "Endocrinology

## 2021-12-15 ENCOUNTER — Other Ambulatory Visit: Payer: Self-pay | Admitting: "Endocrinology

## 2021-12-15 ENCOUNTER — Other Ambulatory Visit: Payer: Self-pay | Admitting: Nurse Practitioner

## 2021-12-15 LAB — CUP PACEART REMOTE DEVICE CHECK
Battery Remaining Longevity: 11 mo
Battery Voltage: 2.89 V
Brady Statistic RA Percent Paced: 0 %
Brady Statistic RV Percent Paced: 87.39 %
Date Time Interrogation Session: 20230610212701
Implantable Lead Implant Date: 20161115
Implantable Lead Implant Date: 20161115
Implantable Lead Location: 753859
Implantable Lead Location: 753860
Implantable Lead Model: 5076
Implantable Lead Model: 5076
Implantable Pulse Generator Implant Date: 20161115
Lead Channel Impedance Value: 361 Ohm
Lead Channel Impedance Value: 380 Ohm
Lead Channel Impedance Value: 456 Ohm
Lead Channel Impedance Value: 475 Ohm
Lead Channel Pacing Threshold Amplitude: 0.5 V
Lead Channel Pacing Threshold Amplitude: 0.875 V
Lead Channel Pacing Threshold Pulse Width: 0.4 ms
Lead Channel Pacing Threshold Pulse Width: 0.4 ms
Lead Channel Sensing Intrinsic Amplitude: 1.875 mV
Lead Channel Sensing Intrinsic Amplitude: 1.875 mV
Lead Channel Sensing Intrinsic Amplitude: 12.5 mV
Lead Channel Sensing Intrinsic Amplitude: 12.5 mV
Lead Channel Setting Pacing Amplitude: 2.5 V
Lead Channel Setting Pacing Pulse Width: 0.4 ms
Lead Channel Setting Sensing Sensitivity: 2.8 mV

## 2021-12-19 NOTE — Progress Notes (Signed)
Remote pacemaker transmission.   

## 2021-12-30 ENCOUNTER — Encounter: Payer: Self-pay | Admitting: Nurse Practitioner

## 2021-12-30 ENCOUNTER — Ambulatory Visit (INDEPENDENT_AMBULATORY_CARE_PROVIDER_SITE_OTHER): Payer: PPO | Admitting: Nurse Practitioner

## 2021-12-30 VITALS — BP 133/85 | HR 74 | Ht 69.0 in | Wt 245.0 lb

## 2021-12-30 DIAGNOSIS — N184 Chronic kidney disease, stage 4 (severe): Secondary | ICD-10-CM

## 2021-12-30 DIAGNOSIS — E782 Mixed hyperlipidemia: Secondary | ICD-10-CM

## 2021-12-30 DIAGNOSIS — Z794 Long term (current) use of insulin: Secondary | ICD-10-CM | POA: Diagnosis not present

## 2021-12-30 DIAGNOSIS — E1122 Type 2 diabetes mellitus with diabetic chronic kidney disease: Secondary | ICD-10-CM | POA: Diagnosis not present

## 2021-12-30 DIAGNOSIS — I10 Essential (primary) hypertension: Secondary | ICD-10-CM

## 2021-12-30 DIAGNOSIS — F17200 Nicotine dependence, unspecified, uncomplicated: Secondary | ICD-10-CM

## 2021-12-30 LAB — POCT UA - MICROALBUMIN
Creatinine, POC: 200 mg/dL
Microalbumin Ur, POC: 150 mg/L

## 2021-12-30 LAB — POCT GLYCOSYLATED HEMOGLOBIN (HGB A1C): HbA1c POC (<> result, manual entry): 8 % (ref 4.0–5.6)

## 2021-12-30 MED ORDER — LANTUS SOLOSTAR 100 UNIT/ML ~~LOC~~ SOPN: 70.0000 [IU] | PEN_INJECTOR | Freq: Every day | SUBCUTANEOUS | 3 refills | Status: DC

## 2021-12-30 MED ORDER — NOVOLOG FLEXPEN 100 UNIT/ML ~~LOC~~ SOPN: 20.0000 [IU] | PEN_INJECTOR | Freq: Three times a day (TID) | SUBCUTANEOUS | 0 refills | Status: DC

## 2021-12-31 ENCOUNTER — Telehealth: Payer: Self-pay | Admitting: Nurse Practitioner

## 2021-12-31 DIAGNOSIS — E1122 Type 2 diabetes mellitus with diabetic chronic kidney disease: Secondary | ICD-10-CM

## 2021-12-31 MED ORDER — LANTUS SOLOSTAR 100 UNIT/ML ~~LOC~~ SOPN: 70.0000 [IU] | PEN_INJECTOR | Freq: Every day | SUBCUTANEOUS | 3 refills | Status: DC

## 2021-12-31 NOTE — Telephone Encounter (Signed)
Rx sent 

## 2021-12-31 NOTE — Telephone Encounter (Signed)
Pt states he needs his Lantus to go to [facility] in [place]

## 2022-01-07 ENCOUNTER — Other Ambulatory Visit: Payer: Self-pay | Admitting: Cardiology

## 2022-01-07 DIAGNOSIS — N189 Chronic kidney disease, unspecified: Secondary | ICD-10-CM | POA: Diagnosis not present

## 2022-01-08 ENCOUNTER — Other Ambulatory Visit: Payer: Self-pay | Admitting: *Deleted

## 2022-01-08 ENCOUNTER — Other Ambulatory Visit: Payer: Self-pay | Admitting: Internal Medicine

## 2022-01-08 DIAGNOSIS — Z716 Tobacco abuse counseling: Secondary | ICD-10-CM | POA: Diagnosis not present

## 2022-01-08 DIAGNOSIS — D751 Secondary polycythemia: Secondary | ICD-10-CM | POA: Diagnosis not present

## 2022-01-08 DIAGNOSIS — I429 Cardiomyopathy, unspecified: Secondary | ICD-10-CM | POA: Diagnosis not present

## 2022-01-08 DIAGNOSIS — E211 Secondary hyperparathyroidism, not elsewhere classified: Secondary | ICD-10-CM | POA: Diagnosis not present

## 2022-01-08 DIAGNOSIS — I9589 Other hypotension: Secondary | ICD-10-CM | POA: Diagnosis not present

## 2022-01-08 DIAGNOSIS — E1122 Type 2 diabetes mellitus with diabetic chronic kidney disease: Secondary | ICD-10-CM | POA: Diagnosis not present

## 2022-01-08 DIAGNOSIS — D472 Monoclonal gammopathy: Secondary | ICD-10-CM | POA: Diagnosis not present

## 2022-01-08 DIAGNOSIS — Z8679 Personal history of other diseases of the circulatory system: Secondary | ICD-10-CM | POA: Diagnosis not present

## 2022-01-08 DIAGNOSIS — N189 Chronic kidney disease, unspecified: Secondary | ICD-10-CM | POA: Diagnosis not present

## 2022-01-08 DIAGNOSIS — Z6835 Body mass index (BMI) 35.0-35.9, adult: Secondary | ICD-10-CM | POA: Diagnosis not present

## 2022-02-03 ENCOUNTER — Other Ambulatory Visit: Payer: Self-pay | Admitting: *Deleted

## 2022-02-03 NOTE — Patient Outreach (Signed)
  Care Management   Outreach Note  02/03/2022 Name: HAYDEN KIHARA MRN: [id number] DOB: 01-16-1946  An unsuccessful telephone outreach was attempted today. The patient was referred to the case management team for assistance with care management and care coordination.   Follow Up Plan:  The care management team will reach out to the patient again over the next 3 days.   Valente David, RN, MSN, [place] [facility]  Care Coordinator [phone]

## 2022-02-04 NOTE — Progress Notes (Signed)
[facility] [address] [place] [address]   CLINIC:  Medical Oncology/Hematology  CONSULT NOTE  Patient Care Team: Glenda Chroman, MD as PCP - General (Internal Medicine) [name], MD as PCP - Cardiology (Cardiology) Thompson Grayer, MD as PCP - Electrophysiology (Cardiology) Case, Reche Dixon, MD as Attending Physician (Orthopedic Surgery) Dorene Ar, MD as Consulting Physician (Pain Medicine)  CHIEF COMPLAINTS/PURPOSE OF CONSULTATION:  Erythrocytosis and abnormal SPEP  HISTORY OF PRESENTING ILLNESS:  Dylan Tyler 76 y.o. male is here at the request of his nephrologist (Dr. Theador Hawthorne) for evaluation of erythrocytosis and abnormal SPEP.  SPEP (01/07/2022) showed slight irregularity in the gamma region, and immunofixation showed faint monoclonal protein types as IgM kappa, but with concentration too low to accurately quantify.  Urine immunofixation showed polyclonal light chains, but no monoclonal immunoglobulin or free light chain.  CBC from 03/13/2021 showed Hgb 18.0/HCT 52.8. CBC from 01/07/2022 showed Hgb 17.5/HCT 51.9. Elevated hemoglobin has dated back to at least December 2020.  Regarding his possible MGUS, He denies any new bone pain or recent fractures.  He denies any B symptoms such as fever, chills, night sweats, unintentional weight loss.  No new neurologic symptoms such as tinnitus, new-onset hearing loss, blurred vision, or dizziness.  Denies any numbness or tingling in hands or feet.  Occasional headaches which are "normal."  No thromboembolic events since his last visit.  No new masses or lymphadenopathy per his report.  Regarding his erythrocytosis, patient reports that he has been smoking 1 PPD cigarettes since age 61.  Patient also reports that he has been diagnosed with obstructive sleep apnea, but does not use his CPAP due to difficulty sleeping while wearing it.  He is also on Lasix 40 mg twice daily.  He has underlying cardiopulmonary  disease with COPD and congestive heart failure.  He denies any testosterone supplementation or known carbon monoxide exposure.  No family history of myeloproliferative neoplasm.  He does not have any history of any DVT, PE, MI, or CVA.  No vasomotor symptoms or B symptoms.  He reports little to no energy but 100% appetite.  He denies any unintentional weight changes.  Past medical history also significant for CKD stage IIIb (diabetic nephropathy), type 2 diabetes mellitus, systolic CHF, obesity, hypertension, atrial fibrillation (Eliquis), GERD, sleep apnea and COPD.  Patient is retired Administrator.  He lives at home with his wife and is independent with ADLs and ambulation.  He has been smoking 1 PPD cigarettes for the past 60 years (since age 27).  He has a history of heavy drinking in his 69s, but reports only occasional alcohol consumption now.  Denies any illicit drug use.  Family history is negative for any known blood disorders or cancer.   MEDICAL HISTORY:  Past Medical History:  Diagnosis Date   Ankylosing spondylitis (HCC)    Anxiety    Aortic root dilatation (HCC)    AR (aortic regurgitation)    Atrial fibrillation and flutter (HCC)    Bladder cancer (HCC)    Chronic pain    Follow[facility] at [facility]   COPD (chronic obstructive pulmonary disease) (HCC)    DDD (degenerative disc disease), lumbosacral    DDD (degenerative disc disease), thoracolumbar    Degenerative cervical disc    Depression    Diabetes mellitus type II    Diverticulitis    Essential hypertension    GERD (gastroesophageal reflux disease)    History of kidney stones  Hyperlipidemia    Migraine    Nonischemic cardiomyopathy ([place])    Cardiolite 12/09 LVEF 55%, minor luminal irregularities at cardiac catheterization   OSA on CPAP    Rheumatoid arthritis (HCC)    Symptomatic bradycardia    Medtronic Advisa L dual-chamber pacemaker  05/2015 - Dr. Curt Bears    SURGICAL HISTORY: Past Surgical History:   Procedure Laterality Date   CARDIAC CATHETERIZATION  08/13/11   CARDIOVERSION N/A 07/02/2015   Procedure: CARDIOVERSION;  Surgeon: Sanda Klein, MD;  Location: [facility];  Service: Cardiovascular;  Laterality: N/A;   CATARACT EXTRACTION W/PHACO Left 02/10/2019   Procedure: CATARACT EXTRACTION PHACO AND INTRAOCULAR LENS PLACEMENT ([place]);  Surgeon: Baruch Goldmann, MD;  Location: [facility];  Service: Ophthalmology;  Laterality: Left;  CDE: 7.10   CATARACT EXTRACTION W/PHACO Right 02/27/2019   Procedure: CATARACT EXTRACTION PHACO AND INTRAOCULAR LENS PLACEMENT RIGHT EYE CDE=11.84;  Surgeon: Baruch Goldmann, MD;  Location: [facility];  Service: Ophthalmology;  Laterality: Right;  right   CYSTOSCOPY WITH HOLMIUM LASER LITHOTRIPSY  1990's   EP IMPLANTABLE DEVICE N/A 05/21/2015   MDT Advisa DR MRI pacemaker implanted by Dr Curt Bears   KNEE ARTHROSCOPY Right ~ [place]  ~ 2008   TRANSURETHRAL RESECTION OF BLADDER TUMOR WITH GYRUS (TURBT-GYRUS)  1990's?    SOCIAL HISTORY: Social History   Socioeconomic History   Marital status: Married    Spouse name: Not on file   Number of children: Not on file   Years of education: Not on file   Highest education level: Not on file  Occupational History   Occupation: Disabled    Employer: DISABLED  Tobacco Use   Smoking status: Every Day    Packs/day: 0.50    Years: 50.00    Total pack years: 25.00    Types: E-cigarettes, Cigarettes    Start date: 02/26/1959   Smokeless tobacco: Former    Types: Chew   Tobacco comments:    05/21/2015 "quit chewing in 1992"  Vaping Use   Vaping Use: Every day   Substances: Nicotine, Flavoring  Substance and Sexual Activity   Alcohol use: Yes    Alcohol/week: 2.0 standard drinks of alcohol    Types: 2 Cans of beer per week   Drug use: Yes    Types: Cocaine    Comment: 05/21/2015 "quit in the early 2000"   Sexual activity: Not on file  Other Topics Concern   Not on file  Social History  Narrative   Divorced   No regular exercise   Social Determinants of Health   Financial Resource Strain: Not on file  Food Insecurity: Not on file  Transportation Needs: Not on file  Physical Activity: Not on file  Stress: Not on file  Social Connections: Not on file  Intimate Partner Violence: Not on file    FAMILY HISTORY: Family History  Problem Relation Age of Onset   Heart attack Mother    Cancer Father     ALLERGIES:  has No Known Allergies.  MEDICATIONS:  Current Outpatient Medications  Medication Sig Dispense Refill   acetaminophen (TYLENOL) 500 MG tablet Take 500 mg by mouth every 6 (six) hours as needed (pain).      albuterol (PROVENTIL) (2.5 MG/3ML) 0.083% nebulizer solution Take 2.5 mg by nebulization every 6 (six) hours as needed for wheezing.     albuterol (VENTOLIN HFA) 108 (90 Base) MCG/ACT inhaler SMARTSIG:1-2 Puff(s) Via Inhaler Every 4-6 Hours PRN     Alcohol Swabs 70 %  PADS Use as directed 120 each 2   ALPRAZolam (XANAX) 0.5 MG tablet Take 0.5 mg by mouth 2 (two) times daily as needed.  1   BD PEN NEEDLE NANO U/F 32G X 4 MM MISC USE AS DIRECTED FOUR TIMES DAILY 200 each 2   calcitRIOL (ROCALTROL) 0.25 MCG capsule Take 0.25 mcg by mouth 3 (three) times a week.     carvedilol (COREG) 6.25 MG tablet TAKE ONE TABLET BY MOUTH TWICE DAILY WITH MEALS 180 tablet 3   Cholecalciferol (VITAMIN D-3) 1000 UNITS CAPS Take 1,000 Units by mouth 2 (two) times daily.      cholestyramine light (PREVALITE) 4 g packet SMARTSIG:1 Packet(s) By Mouth Twice Daily PRN     citalopram (CELEXA) 40 MG tablet Take 20-40 mg by mouth daily.      Continuous Blood Gluc Receiver (FREESTYLE LIBRE 2 READER) DEVI USE WITH SENSORS TO MONITOR BLOOD SUGAR. 1 each 0   Continuous Blood Gluc Sensor (FREESTYLE LIBRE 2 SENSOR) MISC APPLY ONE SENSOR EVERY 14 DAYS AS DIRECTED 2 each 2   ELIQUIS 5 MG TABS tablet TAKE ONE TABLET BY MOUTH TWICE DAILY 60 tablet 6   ENTRESTO 24-26 MG TAKE ONE TABLET BY MOUTH  TWICE DAILY 240 tablet 1   furosemide (LASIX) 40 MG tablet Take 2 tablets (80 mg total) by mouth 3 (three) times a week AND 1 tablet (40 mg total) 4 (four) times a week. (per Dr. Theador Hawthorne instructions) FUTURE REFILLS PER BHUTANI SINCE HE HAS MADE RECENT CHANGES, WIFE WILL CONTACT THIS PROVIDER. 40 tablet 0   gabapentin (NEURONTIN) 400 MG capsule Take 400 mg by mouth 3 (three) times daily.      IBU 800 MG tablet Take 800 mg by mouth every 6 (six) hours as needed.     insulin aspart (NOVOLOG FLEXPEN) 100 UNIT/ML FlexPen Inject 20-26 Units into the skin 3 (three) times daily with meals. 90 mL 0   insulin glargine (LANTUS SOLOSTAR) 100 UNIT/ML Solostar Pen Inject 70 Units into the skin at bedtime. 30 mL 3   Insulin Pen Needle (B-D ULTRAFINE III SHORT PEN) 31G X 8 MM MISC Use to inject insulin 4 x daily 200 each 3   ipratropium-albuterol (DUONEB) 0.5-2.5 (3) MG/3ML SOLN Take 3 mLs by nebulization every 4 (four) hours as needed (for shortness of breath).     Lancets (FREESTYLE) lancets USE TO CHECK BLOOD SUGAR FOUR TIMES DAILY. 200 each 5   magnesium oxide (MAG-OX) 400 (240 Mg) MG tablet Take 1 tablet by mouth daily.     metoCLOPramide (REGLAN) 5 MG tablet Take 5 mg by mouth every 6 (six) hours as needed for nausea or vomiting.     nitroGLYCERIN (NITROSTAT) 0.4 MG SL tablet Place 1 tablet (0.4 mg total) under the tongue every 5 (five) minutes as needed for chest pain (if no relief after 2nd dose, proceed to the ED for an evaluation or call 911). 25 tablet 3   NON FORMULARY 1 each by Other route See admin instructions. CPAP - Uses nightly     simvastatin (ZOCOR) 40 MG tablet TAKE ONE TABLET BY MOUTH AT BEDTIME 90 tablet 3   traMADol (ULTRAM) 50 MG tablet Take 1 tablet by mouth daily as needed (pain).      TRELEGY ELLIPTA 100-62.5-25 MCG/INH AEPB Inhale 1 puff into the lungs daily.     triamcinolone cream (KENALOG) 0.1 % Apply 1 application topically 2 (two) times daily as needed.     vitamin B-12  (CYANOCOBALAMIN) 1000  MCG tablet Take 1,000 mcg by mouth 2 (two) times daily.     VOLTAREN 1 % GEL Apply 2 g topically 4 (four) times daily as needed (for pain).      No current facility-administered medications for this visit.    REVIEW OF SYSTEMS:   Review of Systems  Constitutional:  Positive for fatigue. Negative for appetite change, chills, diaphoresis, fever and unexpected weight change.  HENT:   Negative for lump/mass and nosebleeds.   Eyes:  Negative for eye problems.  Respiratory:  Positive for shortness of breath (with exertion, chronic). Negative for cough and hemoptysis.   Cardiovascular:  Negative for chest pain, leg swelling and palpitations.  Gastrointestinal:  Positive for diarrhea. Negative for abdominal pain, blood in stool, constipation, nausea and vomiting.  Genitourinary:  Negative for hematuria.   Musculoskeletal:  Positive for arthralgias.  Skin: Negative.   Neurological:  Negative for dizziness, headaches and light-headedness.  Hematological:  Does not bruise/bleed easily.  Psychiatric/Behavioral:  Positive for sleep disturbance.       PHYSICAL EXAMINATION: ECOG PERFORMANCE STATUS: 1 - Symptomatic but completely ambulatory  There were no vitals filed for this visit. There were no vitals filed for this visit.  Physical Exam Constitutional:      Appearance: Normal appearance. He is obese.  HENT:     Head: Normocephalic and atraumatic.     Mouth/Throat:     Mouth: Mucous membranes are moist.  Eyes:     Extraocular Movements: Extraocular movements intact.     Pupils: Pupils are equal, round, and reactive to light.  Cardiovascular:     Rate and Rhythm: Normal rate and regular rhythm.     Pulses: Normal pulses.     Heart sounds: Normal heart sounds.  Pulmonary:     Effort: Pulmonary effort is normal.     Breath sounds: Normal breath sounds.     Comments: Quiet lungs Abdominal:     General: Bowel sounds are normal.     Palpations: Abdomen is soft.      Tenderness: There is no abdominal tenderness.  Musculoskeletal:        General: No swelling.     Right lower leg: No edema.     Left lower leg: No edema.  Lymphadenopathy:     Cervical: No cervical adenopathy.  Skin:    General: Skin is warm and dry.  Neurological:     General: No focal deficit present.     Mental Status: He is alert and oriented to person, place, and time.  Psychiatric:        Mood and Affect: Mood normal.        Behavior: Behavior normal.       LABORATORY DATA:  I have reviewed the data as listed Recent Results (from the past 2160 hour(s))  Basic metabolic panel     Status: Abnormal   Collection Time: 12/03/21 12:00 AM  Result Value Ref Range   Glucose 231    BUN 13 4 - 21   CO2 28 (A) 13 - 22   Creatinine 1.7 (A) 0.6 - 1.3   Potassium 4.7 3.5 - 5.1 mEq/L   Sodium 137 137 - 147   Chloride 102 99 - 108  Comprehensive metabolic panel     Status: Abnormal   Collection Time: 12/03/21 12:00 AM  Result Value Ref Range   Calcium 8.8 8.7 - 10.7   Albumin 3.2 (A) 3.5 - 5.0  Lipid panel     Status: Abnormal  Collection Time: 12/03/21 12:00 AM  Result Value Ref Range   Triglycerides 113 40 - 160   Cholesterol 101 0 - 200   HDL 32 (A) 35 - 70   LDL Cholesterol 46   Hepatic function panel     Status: None   Collection Time: 12/03/21 12:00 AM  Result Value Ref Range   Alkaline Phosphatase 74 25 - 125   ALT 20 10 - 40 U/L   AST 18 14 - 40   Bilirubin, Total 0.6   TSH     Status: None   Collection Time: 12/03/21 12:00 AM  Result Value Ref Range   TSH 1.98 0.41 - 5.90  ECHOCARDIOGRAM COMPLETE     Status: None   Collection Time: 12/09/21  2:57 PM  Result Value Ref Range   S' Lateral 3.69 cm   Area-P 1/2 4.86 cm2   Single Plane A2C EF 51.5 %   Single Plane A4C EF 50.2 %   Calc EF 50.0 %   MV M vel 2.53 m/s   MV Peak grad 25.6 mmHg   P 1/2 time 1,525 msec   AV Vena cont 0.38 cm   AV Area VTI 2.66 cm2   AV Area mean vel 2.32 cm2   AR max vel 2.62  cm2   AV Peak grad 5.3 mmHg   Ao pk vel 1.15 m/s   AV Mean grad 3.3 mmHg  CUP PACEART REMOTE DEVICE CHECK     Status: None   Collection Time: 12/13/21  9:27 PM  Result Value Ref Range   Date Time Interrogation Session [id number]    Pulse Generator Manufacturer MERM    Pulse Gen Model [id number] Advisa DR MRI    Pulse Gen Serial Number [id number]    Clinic Name [facility]    Implantable Pulse Generator Type Implantable Pulse Generator    Implantable Pulse Generator Implant Date [id number]    Implantable Lead Manufacturer MERM    Implantable Lead Model 5076 CapSureFix Novus    Implantable Lead Serial Number [id number]    Implantable Lead Implant Date 24401027    Implantable Lead Location Detail 1 UNKNOWN    Implantable Lead Location [id number]    Implantable Lead Manufacturer MERM    Implantable Lead Model 5076 CapSureFix Novus    Implantable Lead Serial Number [id number]    Implantable Lead Implant Date [id number]    Implantable Lead Location Detail 1 UNKNOWN    Implantable Lead Location [phone]    Lead Channel Setting Sensing Sensitivity 2.8 mV   Lead Channel Setting Pacing Pulse Width 0.4 ms   Lead Channel Setting Pacing Amplitude 2.5 V   Lead Channel Impedance Value 475 ohm   Lead Channel Impedance Value 380 ohm   Lead Channel Sensing Intrinsic Amplitude 1.875 mV   Lead Channel Sensing Intrinsic Amplitude 1.875 mV   Lead Channel Pacing Threshold Amplitude 0.5 V   Lead Channel Pacing Threshold Pulse Width 0.4 ms   Lead Channel Impedance Value 456 ohm   Lead Channel Impedance Value 361 ohm   Lead Channel Sensing Intrinsic Amplitude 12.5 mV   Lead Channel Sensing Intrinsic Amplitude 12.5 mV   Lead Channel Pacing Threshold Amplitude 0.875 V   Lead Channel Pacing Threshold Pulse Width 0.4 ms   Battery Status OK    Battery Remaining Longevity 11 mo   Battery Voltage 2.89 V   Brady Statistic RA Percent Paced 0 %   Brady Statistic RV Percent Paced 87.39 %  POCT UA - Microalbumin  Status: Abnormal   Collection Time: 12/30/21  1:17 PM  Result Value Ref Range   Microalbumin Ur, POC 150 mg/L   Creatinine, POC 200 mg/dL   Albumin/Creatinine Ratio, Urine, POC 30-300   HgB A1c     Status: Abnormal   Collection Time: 12/30/21  1:18 PM  Result Value Ref Range   Hemoglobin A1C     HbA1c POC (<> result, manual entry) 8.0 4.0 - 5.6 %   HbA1c, POC (prediabetic range)     HbA1c, POC (controlled diabetic range)      RADIOGRAPHIC STUDIES: I have personally reviewed the radiological images as listed and agreed with the findings in the report. No results found.  ASSESSMENT & PLAN: 1.  Abnormal SPEP - Seen at the request of his nephrologist (Dr. Theador Hawthorne) for evaluation of erythrocytosis and abnormal SPEP. - Labs sent by nephrologist: SPEP (01/07/2022) showed slight irregularity in the gamma region, and immunofixation showed faint monoclonal protein types as IgM kappa, but with concentration too low to accurately quantify.  Urine immunofixation showed polyclonal light chains, but no monoclonal immunoglobulin or free light chain. - No new bone pain, fractures, B symptoms, or neurologic changes. - PLAN: We will check MGUS/myeloma panel including CBC, CMP, beta-2 microglobulin, LDH, immunofixation, SPEP, and kappa/lambda free light chains. - If any abnormalities in the above tests, we will check 24-hour urine study and skeletal survey.  2.  Erythrocytosis - Seen at the request of his nephrologist (Dr. Theador Hawthorne) for evaluation of erythrocytosis and abnormal SPEP. - Elevated hemoglobin has dated back to at least December 2020.  CBC from 03/13/2021 showed Hgb 18.0/HCT 52.8.  CBC from 01/07/2022 showed Hgb 17.5/HCT 51.9.   -Smokes 1 PPD cigarettes since age 73. - Diagnosed with OSA, but noncompliant with CPAP - High-dose diuretics with Lasix 40 mg twice daily - Underlying cardiopulmonary disease with COPD and CHF - No vasomotor symptoms or B symptoms. - Patient takes Eliquis for atrial  fibrillation - PLAN: Suspect secondary polycythemia in the setting of sleep apnea, tobacco dependence, and COPD/CHF.  May also have some aspect of hemoconcentration in the setting of diuretics. - We will check EPO and JAK2 with reflex. - We have discussed that there is little role for therapeutic phlebotomy except in cases of severe symptoms or HCT >55% - No need for aspirin, as patient takes Eliquis for atrial fibrillation  4.  Tobacco abuse - This patient meets criteria for low-dose CT lung cancer screening (61-pack-year history, current everyday smoker 1 PPD, age 60, no symptoms of active lung cancer) - The shared decision making visit discussion included risks and benefits of screening, potential for follow-up, diagnostic testing for abnormal scans, potential for false positive tests, overdiagnosis, discussion about total radiation exposure - Patient stated willingness to undergo diagnostics and treatment as needed - Patient was counseled on smoking cessation to decrease the  risk of lung cancer, pulmonary disease, heart disease, and stroke - Patient has been referred to Lung Cancer Screening Nurse Coordinator for further scheduling of LDCT and for further resources regarding free nicotine replacement therapy and information about smoking cessation classes  - PLAN: LDCT chest ordered.  Will refer to lung cancer screening coordinator for annual CT follow-up.  4.  Other history - PMH: CKD stage IIIb (diabetic nephropathy), type 2 diabetes mellitus, systolic CHF, obesity, hypertension, atrial fibrillation (Eliquis), GERD, sleep apnea and COPD. - SOCIAL:  Retired Administrator.  He lives at home with his wife and is independent with ADLs and ambulation.  He has been smoking 1 PPD cigarettes for the past 60 years (since age 76).  He has a history of heavy drinking in his 44s, but reports only occasional alcohol consumption now.  Denies any illicit drug use. - FAMILY: negative for any known blood  disorders or cancer.   PLAN SUMMARY & DISPOSITION: Labs today LDCT chest Follow-up visit in 1 months  All questions were answered. The patient knows to call the clinic with any problems, questions or concerns.   Medical decision making: Moderate  Time spent on visit: I spent 30 minutes counseling the patient face to face. The total time spent in the appointment was 55 minutes and more than 50% was on counseling.  I, Tarri Abernethy PA-C, have seen this patient in conjunction with Dr. Derek Jack.  Greater than 50% of visit was performed by Dr. Delton Coombes.   Harriett Rush, PA-C 02/06/2022 8:22 PM  DR. Jalena Vanderlinden: I have independently evaluated this patient and formulated my assessment and plan.  I agree with HPI, assessment and plan dictated by Casey Burkitt, PA-C.  Patient evaluated at the request of Dr. Theador Hawthorne for abnormal SPEP, erythrocytosis.  Will order further workup to evaluate for myeloproliferative disorders and plasma cell disorders.  He does qualify for low-dose CT scan based on smoking history.  We will see him back in 1 month for follow-up.

## 2022-02-05 ENCOUNTER — Inpatient Hospital Stay: Payer: PPO | Attending: Hematology | Admitting: Hematology

## 2022-02-05 ENCOUNTER — Encounter: Payer: Self-pay | Admitting: Hematology

## 2022-02-05 ENCOUNTER — Inpatient Hospital Stay: Payer: PPO

## 2022-02-05 VITALS — BP 117/64 | HR 88 | Temp 97.6°F | Resp 16 | Ht 69.0 in | Wt 241.6 lb

## 2022-02-05 DIAGNOSIS — Z794 Long term (current) use of insulin: Secondary | ICD-10-CM | POA: Diagnosis not present

## 2022-02-05 DIAGNOSIS — I5022 Chronic systolic (congestive) heart failure: Secondary | ICD-10-CM | POA: Diagnosis not present

## 2022-02-05 DIAGNOSIS — G4733 Obstructive sleep apnea (adult) (pediatric): Secondary | ICD-10-CM | POA: Diagnosis not present

## 2022-02-05 DIAGNOSIS — D751 Secondary polycythemia: Secondary | ICD-10-CM | POA: Diagnosis not present

## 2022-02-05 DIAGNOSIS — R778 Other specified abnormalities of plasma proteins: Secondary | ICD-10-CM | POA: Insufficient documentation

## 2022-02-05 DIAGNOSIS — F17210 Nicotine dependence, cigarettes, uncomplicated: Secondary | ICD-10-CM | POA: Insufficient documentation

## 2022-02-05 DIAGNOSIS — N1832 Chronic kidney disease, stage 3b: Secondary | ICD-10-CM | POA: Insufficient documentation

## 2022-02-05 DIAGNOSIS — M069 Rheumatoid arthritis, unspecified: Secondary | ICD-10-CM | POA: Diagnosis not present

## 2022-02-05 DIAGNOSIS — I130 Hypertensive heart and chronic kidney disease with heart failure and stage 1 through stage 4 chronic kidney disease, or unspecified chronic kidney disease: Secondary | ICD-10-CM | POA: Insufficient documentation

## 2022-02-05 DIAGNOSIS — Z7901 Long term (current) use of anticoagulants: Secondary | ICD-10-CM | POA: Diagnosis not present

## 2022-02-05 DIAGNOSIS — J449 Chronic obstructive pulmonary disease, unspecified: Secondary | ICD-10-CM | POA: Diagnosis not present

## 2022-02-05 DIAGNOSIS — I4891 Unspecified atrial fibrillation: Secondary | ICD-10-CM | POA: Insufficient documentation

## 2022-02-05 DIAGNOSIS — Z79899 Other long term (current) drug therapy: Secondary | ICD-10-CM | POA: Diagnosis not present

## 2022-02-05 DIAGNOSIS — Z87891 Personal history of nicotine dependence: Secondary | ICD-10-CM

## 2022-02-05 LAB — CBC WITH DIFFERENTIAL/PLATELET
Abs Immature Granulocytes: 0.02 10*3/uL (ref 0.00–0.07)
Basophils Absolute: 0.1 10*3/uL (ref 0.0–0.1)
Basophils Relative: 1 %
Eosinophils Absolute: 0.2 10*3/uL (ref 0.0–0.5)
Eosinophils Relative: 2 %
HCT: 49.5 % (ref 39.0–52.0)
Hemoglobin: 16.6 g/dL (ref 13.0–17.0)
Immature Granulocytes: 0 %
Lymphocytes Relative: 36 %
Lymphs Abs: 3.5 10*3/uL (ref 0.7–4.0)
MCH: 28.4 pg (ref 26.0–34.0)
MCHC: 33.5 g/dL (ref 30.0–36.0)
MCV: 84.8 fL (ref 80.0–100.0)
Monocytes Absolute: 0.9 10*3/uL (ref 0.1–1.0)
Monocytes Relative: 9 %
Neutro Abs: 5.3 10*3/uL (ref 1.7–7.7)
Neutrophils Relative %: 52 %
Platelets: 141 10*3/uL — ABNORMAL LOW (ref 150–400)
RBC: 5.84 MIL/uL — ABNORMAL HIGH (ref 4.22–5.81)
RDW: 13.9 % (ref 11.5–15.5)
WBC: 9.9 10*3/uL (ref 4.0–10.5)
nRBC: 0 % (ref 0.0–0.2)

## 2022-02-05 LAB — COMPREHENSIVE METABOLIC PANEL
ALT: 14 U/L (ref 0–44)
AST: 16 U/L (ref 15–41)
Albumin: 3.6 g/dL (ref 3.5–5.0)
Alkaline Phosphatase: 63 U/L (ref 38–126)
Anion gap: 8 (ref 5–15)
BUN: 17 mg/dL (ref 8–23)
CO2: 27 mmol/L (ref 22–32)
Calcium: 8.9 mg/dL (ref 8.9–10.3)
Chloride: 102 mmol/L (ref 98–111)
Creatinine, Ser: 1.75 mg/dL — ABNORMAL HIGH (ref 0.61–1.24)
GFR, Estimated: 40 mL/min — ABNORMAL LOW (ref >60)
Glucose, Bld: 249 mg/dL — ABNORMAL HIGH (ref 70–99)
Potassium: 4.5 mmol/L (ref 3.5–5.1)
Sodium: 137 mmol/L (ref 135–145)
Total Bilirubin: 1.2 mg/dL (ref 0.3–1.2)
Total Protein: 6.8 g/dL (ref 6.5–8.1)

## 2022-02-05 LAB — LACTATE DEHYDROGENASE: LDH: 145 U/L (ref 98–192)

## 2022-02-05 NOTE — Patient Instructions (Signed)
Dylan Tyler at [facility] **   You were seen today by Dr. Delton Coombes & Tarri Abernethy PA-C for your abnormal protein levels and elevated blood count.    ABNORMAL PROTEIN Labs from Dr. Theador Hawthorne show slightly increased amount of protein, which may be a sign of a condition called "MGUS."   MGUS is a precancerous condition that can lead to a cancer called multiple myeloma in about 1% of patients per year.   We will check labs today to determine if you have MGUS or risk of future multiple myeloma.    ELEVATED RED BLOOD CELLS Your elevated red blood cells (called erythrocytosis or polycythemia) are most likely due to your other underlying health conditions (COPD, sleep apnea, smoking, and Lasix) Elevated red blood cells places you at risk of blood clots, heart attack, and stroke. We will check additional labs today to make sure you do not have any genetic mutation causing elevated red blood cells. The best way to treat your elevated red blood cells is to treat the underlying condition causing them to be high. Stop smoking. Wear CPAP. Treat your COPD.  SMOKING: Please see the attached handout regarding smoking cessation.  We will schedule you for CT scan to screen for lung cancer, which will be repeated once a year until you turn 80.  LABS: Check labs today before leaving the hospital  MEDICATIONS: No changes to home medications  FOLLOW-UP APPOINTMENT: Office visit in 1 month  ** Thank you for trusting me with your healthcare!  I strive to provide all of my patients with quality care at each visit.  If you receive a survey for this visit, I would be so grateful to you for taking the time to provide feedback.  Thank you in advance!  ~ Zola Runion                   Dr. Derek Jack   &   Tarri Abernethy, PA-C   - - - - - - - - - - - - - - - - - -    Thank you for choosing [facility]  to provide your oncology and hematology care.  To afford each patient quality time with our provider, please arrive at least 15 minutes before your scheduled appointment time.   If you have a lab appointment with the [facility] please come in thru the Main Entrance and check in at the main information desk.  You need to re-schedule your appointment should you arrive 10 or more minutes late.  We strive to give you quality time with our providers, and arriving late affects you and other patients whose appointments are after yours.  Also, if you no show three or more times for appointments you may be dismissed from the clinic at the providers discretion.     Again, thank you for choosing [facility].  Our hope is that these requests will decrease the amount of time that you wait before being seen by our physicians.       _____________________________________________________________  Should you have questions after your visit to [facility], please contact our office at [phone] and follow the prompts.  Our office hours are 8:00 a.m. and 4:30 p.m. Monday - Friday.  Please note that voicemails left after 4:00 p.m. may not be returned until the following business day.  We are closed weekends and major holidays.  You do have access to a nurse 24-7, just call the main number to the clinic [phone] and do not press any options, hold on the line and a nurse will answer the phone.    For prescription refill requests, have your pharmacy contact our office and allow 72 hours.

## 2022-02-06 ENCOUNTER — Encounter: Payer: Self-pay | Admitting: Internal Medicine

## 2022-02-06 ENCOUNTER — Ambulatory Visit: Payer: PPO | Admitting: Internal Medicine

## 2022-02-06 ENCOUNTER — Other Ambulatory Visit: Payer: Self-pay | Admitting: *Deleted

## 2022-02-06 VITALS — BP 112/62 | HR 92 | Ht 69.0 in | Wt 242.0 lb

## 2022-02-06 DIAGNOSIS — D6869 Other thrombophilia: Secondary | ICD-10-CM

## 2022-02-06 DIAGNOSIS — I459 Conduction disorder, unspecified: Secondary | ICD-10-CM

## 2022-02-06 DIAGNOSIS — I442 Atrioventricular block, complete: Secondary | ICD-10-CM

## 2022-02-06 DIAGNOSIS — I4821 Permanent atrial fibrillation: Secondary | ICD-10-CM | POA: Diagnosis not present

## 2022-02-06 DIAGNOSIS — Z720 Tobacco use: Secondary | ICD-10-CM | POA: Diagnosis not present

## 2022-02-06 LAB — CUP PACEART INCLINIC DEVICE CHECK
Battery Remaining Longevity: 9 mo
Battery Voltage: 2.88 V
Brady Statistic RA Percent Paced: 0 %
Brady Statistic RV Percent Paced: 90.07 %
Date Time Interrogation Session: 20230804110554
Implantable Lead Implant Date: 20161115
Implantable Lead Implant Date: 20161115
Implantable Lead Location: 753859
Implantable Lead Location: 753860
Implantable Lead Model: 5076
Implantable Lead Model: 5076
Implantable Pulse Generator Implant Date: 20161115
Lead Channel Impedance Value: 342 Ohm
Lead Channel Impedance Value: 361 Ohm
Lead Channel Impedance Value: 437 Ohm
Lead Channel Impedance Value: 494 Ohm
Lead Channel Pacing Threshold Amplitude: 0.5 V
Lead Channel Pacing Threshold Amplitude: 0.875 V
Lead Channel Pacing Threshold Pulse Width: 0.4 ms
Lead Channel Pacing Threshold Pulse Width: 0.4 ms
Lead Channel Sensing Intrinsic Amplitude: 1.625 mV
Lead Channel Sensing Intrinsic Amplitude: 20 mV
Lead Channel Sensing Intrinsic Amplitude: 20 mV
Lead Channel Sensing Intrinsic Amplitude: 3.25 mV
Lead Channel Setting Pacing Amplitude: 2.5 V
Lead Channel Setting Pacing Pulse Width: 0.4 ms
Lead Channel Setting Sensing Sensitivity: 2.8 mV

## 2022-02-06 LAB — BETA 2 MICROGLOBULIN, SERUM: Beta-2 Microglobulin: 3.8 mg/L — ABNORMAL HIGH (ref 0.6–2.4)

## 2022-02-06 LAB — ERYTHROPOIETIN: Erythropoietin: 10.5 m[IU]/mL (ref 2.6–18.5)

## 2022-02-06 LAB — KAPPA/LAMBDA LIGHT CHAINS
Kappa free light chain: 49.1 mg/L — ABNORMAL HIGH (ref 3.3–19.4)
Kappa, lambda light chain ratio: 1.79 — ABNORMAL HIGH (ref 0.26–1.65)
Lambda free light chains: 27.5 mg/L — ABNORMAL HIGH (ref 5.7–26.3)

## 2022-02-06 NOTE — Patient Instructions (Signed)
Medication Instructions:  Continue all current medications.  Labwork: none  Testing/Procedures: none  Follow-Up: 7 months   Any Other Special Instructions Will Be Listed Below (If Applicable).   If you need a refill on your cardiac medications before your next appointment, please call your pharmacy.

## 2022-02-06 NOTE — Progress Notes (Signed)
PCP: Glenda Chroman, MD Primary Cardiologist: Dr Domenic Polite Primary EP:  Dr Odette Fraction Dylan Tyler is a 76 y.o. male who presents today for routine electrophysiology followup.  Since last being seen in our clinic, the patient reports doing reasonably well.  He is not active.  + dependant edema.  + chronic SOB with activity.  Today, he denies symptoms of palpitations, chest pain,  dizziness, presyncope, or syncope.  The patient is otherwise without complaint today.   Past Medical History:  Diagnosis Date   Ankylosing spondylitis (HCC)    Anxiety    Aortic root dilatation (HCC)    AR (aortic regurgitation)    Atrial fibrillation and flutter (HCC)    Bladder cancer (HCC)    Chronic pain    Followed at [facility]   COPD (chronic obstructive pulmonary disease) (HCC)    DDD (degenerative disc disease), lumbosacral    DDD (degenerative disc disease), thoracolumbar    Degenerative cervical disc    Depression    Diabetes mellitus type II    Diverticulitis    Essential hypertension    GERD (gastroesophageal reflux disease)    History of kidney stones    Hyperlipidemia    Migraine    Nonischemic cardiomyopathy ([place])    Cardiolite 12/09 LVEF 55%, minor luminal irregularities at cardiac catheterization   OSA on CPAP    Rheumatoid arthritis (HCC)    Symptomatic bradycardia    Medtronic Advisa L dual-chamber pacemaker  05/2015 - Dr. Curt Bears   Past Surgical History:  Procedure Laterality Date   CARDIAC CATHETERIZATION  08/13/11   CARDIOVERSION N/A 07/02/2015   Procedure: CARDIOVERSION;  Surgeon: Sanda Klein, MD;  Location: [facility] [facility];  Service: Cardiovascular;  Laterality: N/A;   CATARACT EXTRACTION W/PHACO Left 02/10/2019   Procedure: CATARACT EXTRACTION PHACO AND INTRAOCULAR LENS PLACEMENT ([place]);  Surgeon: Baruch Goldmann, MD;  Location: [facility];  Service: Ophthalmology;  Laterality: Left;  CDE: 7.10   CATARACT EXTRACTION W/PHACO Right 02/27/2019   Procedure: CATARACT EXTRACTION PHACO AND  INTRAOCULAR LENS PLACEMENT RIGHT EYE CDE=11.84;  Surgeon: Baruch Goldmann, MD;  Location: [facility];  Service: Ophthalmology;  Laterality: Right;  right   CYSTOSCOPY WITH HOLMIUM LASER LITHOTRIPSY  1990's   EP IMPLANTABLE DEVICE N/A 05/21/2015   MDT Advisa DR MRI pacemaker implanted by Dr Curt Bears   KNEE ARTHROSCOPY Right ~ [place]  ~ 2008   TRANSURETHRAL RESECTION OF BLADDER TUMOR WITH GYRUS (TURBT-GYRUS)  1990's?    ROS- all systems are reviewed and negative except as per HPI above  Current Outpatient Medications  Medication Sig Dispense Refill   acetaminophen (TYLENOL) 500 MG tablet Take 500 mg by mouth every 6 (six) hours as needed (pain).      albuterol (PROVENTIL) (2.5 MG/3ML) 0.083% nebulizer solution Take 2.5 mg by nebulization every 6 (six) hours as needed for wheezing.     albuterol (VENTOLIN HFA) 108 (90 Base) MCG/ACT inhaler SMARTSIG:1-2 Puff(s) Via Inhaler Every 4-6 Hours PRN     Alcohol Swabs 70 % PADS Use as directed 120 each 2   ALPRAZolam (XANAX) 0.5 MG tablet Take 0.5 mg by mouth 2 (two) times daily as needed.  1   BD PEN NEEDLE NANO U/F 32G X 4 MM MISC USE AS DIRECTED FOUR TIMES DAILY 200 each 2   calcitRIOL (ROCALTROL) 0.25 MCG capsule Take 0.25 mcg by mouth 3 (three) times a week.     carvedilol (COREG) 6.25 MG tablet TAKE ONE TABLET BY MOUTH TWICE  DAILY WITH MEALS 180 tablet 3   Cholecalciferol (VITAMIN D-3) 1000 UNITS CAPS Take 1,000 Units by mouth 2 (two) times daily.      cholestyramine light (PREVALITE) 4 g packet SMARTSIG:1 Packet(s) By Mouth Twice Daily PRN     citalopram (CELEXA) 40 MG tablet Take 20-40 mg by mouth daily.      Continuous Blood Gluc Receiver (FREESTYLE LIBRE 2 READER) DEVI USE WITH SENSORS TO MONITOR BLOOD SUGAR. 1 each 0   Continuous Blood Gluc Sensor (FREESTYLE LIBRE 2 SENSOR) MISC APPLY ONE SENSOR EVERY 14 DAYS AS DIRECTED 2 each 2   ELIQUIS 5 MG TABS tablet TAKE ONE TABLET BY MOUTH TWICE DAILY 60 tablet 6   ENTRESTO  24-26 MG TAKE ONE TABLET BY MOUTH TWICE DAILY 240 tablet 1   furosemide (LASIX) 40 MG tablet Take 2 tablets (80 mg total) by mouth 3 (three) times a week AND 1 tablet (40 mg total) 4 (four) times a week. (per Dr. Theador Hawthorne instructions) FUTURE REFILLS PER BHUTANI SINCE HE HAS MADE RECENT CHANGES, WIFE WILL CONTACT THIS PROVIDER. 40 tablet 0   gabapentin (NEURONTIN) 400 MG capsule Take 400 mg by mouth 3 (three) times daily.      IBU 800 MG tablet Take 800 mg by mouth every 6 (six) hours as needed.     insulin aspart (NOVOLOG FLEXPEN) 100 UNIT/ML FlexPen Inject 20-26 Units into the skin 3 (three) times daily with meals. 90 mL 0   insulin glargine (LANTUS SOLOSTAR) 100 UNIT/ML Solostar Pen Inject 70 Units into the skin at bedtime. 30 mL 3   Insulin Pen Needle (B-D ULTRAFINE III SHORT PEN) 31G X 8 MM MISC Use to inject insulin 4 x daily 200 each 3   ipratropium-albuterol (DUONEB) 0.5-2.5 (3) MG/3ML SOLN Take 3 mLs by nebulization every 4 (four) hours as needed (for shortness of breath).     Lancets (FREESTYLE) lancets USE TO CHECK BLOOD SUGAR FOUR TIMES DAILY. 200 each 5   magnesium oxide (MAG-OX) 400 (240 Mg) MG tablet Take 1 tablet by mouth daily.     metoCLOPramide (REGLAN) 5 MG tablet Take 5 mg by mouth every 6 (six) hours as needed for nausea or vomiting.     nitroGLYCERIN (NITROSTAT) 0.4 MG SL tablet Place 1 tablet (0.4 mg total) under the tongue every 5 (five) minutes as needed for chest pain (if no relief after 2nd dose, proceed to the ED for an evaluation or call 911). 25 tablet 3   NON FORMULARY 1 each by Other route See admin instructions. CPAP - Uses nightly     simvastatin (ZOCOR) 40 MG tablet TAKE ONE TABLET BY MOUTH AT BEDTIME 90 tablet 3   traMADol (ULTRAM) 50 MG tablet Take 1 tablet by mouth daily as needed (pain).      TRELEGY ELLIPTA 100-62.5-25 MCG/INH AEPB Inhale 1 puff into the lungs daily.     triamcinolone cream (KENALOG) 0.1 % Apply 1 application topically 2 (two) times daily as  needed.     vitamin B-12 (CYANOCOBALAMIN) 1000 MCG tablet Take 1,000 mcg by mouth 2 (two) times daily.     VOLTAREN 1 % GEL Apply 2 g topically 4 (four) times daily as needed (for pain).      No current facility-administered medications for this visit.    Physical Exam: Vitals:   02/06/22 1055  BP: 112/62  Pulse: 92  SpO2: 97%  Weight: 242 lb (109.8 kg)  Height: '5\' 9"'$  (1.753 m)    GEN- The patient  is chronically ill appearing, alert and oriented x 3 today.   Head- normocephalic, atraumatic Eyes-  Sclera clear, conjunctiva pink Ears- hearing intact Oropharynx- clear Lungs- Clear to ausculation bilaterally, normal work of breathing Chest- pacemaker pocket is well healed Heart- Regular rate and rhythm,  (paced) GI- soft, NT, ND, + BS Extremities- no clubbing, cyanosis, + dependant edema  Pacemaker interrogation- reviewed in detail today,  See PACEART report    Assessment and Plan:  1. Symptomatic complete heart block Normal pacemaker function See Pace Art report No changes today he is not device dependant today 9 months estimated battery longevity  Risks, benefits, and alternatives to PPM pulse generator replacement were discussed in detail today.  The patient understands that risks include but are not limited to bleeding, infection, pneumothorax, perforation, tamponade, vascular damage, renal failure, MI, stroke, death, damage to his existing leads, and lead dislodgement and wishes to proceed once ERI. We will have him return in 7 months for further evaluation.   2. Permanent afib Rate controlled On eliquis Labs from 02/05/22 reviewed  3. HTN Stable No change required today  4. Obesity Body mass index is 35.74 kg/m. Lifestyle modification advised  5. Nonischemic CM (chronic) Ef has recovered (recent echo reviewed)  6,. Tobacco Cessation advised He is thinking about quitting.  Risks, benefits and potential toxicities for medications prescribed and/or  refilled reviewed with patient today.   Return in 7 months  Thompson Grayer MD, [facility] 02/06/2022 11:10 AM

## 2022-02-06 NOTE — Patient Outreach (Signed)
  Care Coordination   Initial Visit Note   02/06/2022 Name: Dylan Tyler MRN: [id number] DOB: 1946-02-02  Dylan Tyler is a 76 y.o. year old male who sees Vyas, Dhruv B, MD for primary care. I spoke with  [name] by phone today    SDOH assessments and interventions completed:  No     Care Coordination Interventions Activated:  No  Care Coordination Interventions:  No, not indicated   Follow up plan: No further intervention required.   Encounter Outcome:  Pt. Refused  Accepts offer to be mailed information on program, will call if interested after reviewing details.   Valente David, RN, MSN, [facility] Care Coordinator [phone]

## 2022-02-09 LAB — IMMUNOFIXATION ELECTROPHORESIS
IgA: 234 mg/dL (ref 61–437)
IgG (Immunoglobin G), Serum: 811 mg/dL (ref 603–1613)
IgM (Immunoglobulin M), Srm: 152 mg/dL — ABNORMAL HIGH (ref 15–143)
Total Protein ELP: 6 g/dL (ref 6.0–8.5)

## 2022-02-09 LAB — PROTEIN ELECTROPHORESIS, SERUM
A/G Ratio: 1.1 (ref 0.7–1.7)
Albumin ELP: 3.2 g/dL (ref 2.9–4.4)
Alpha-1-Globulin: 0.2 g/dL (ref 0.0–0.4)
Alpha-2-Globulin: 0.7 g/dL (ref 0.4–1.0)
Beta Globulin: 0.9 g/dL (ref 0.7–1.3)
Gamma Globulin: 0.9 g/dL (ref 0.4–1.8)
Globulin, Total: 2.8 g/dL (ref 2.2–3.9)
Total Protein ELP: 6 g/dL (ref 6.0–8.5)

## 2022-02-11 LAB — JAK2 V617F RFX CALR/MPL/E12-15

## 2022-02-11 LAB — CALR +MPL + E12-E15  (REFLEX)

## 2022-02-16 DIAGNOSIS — Z720 Tobacco use: Secondary | ICD-10-CM | POA: Diagnosis not present

## 2022-02-16 DIAGNOSIS — E1159 Type 2 diabetes mellitus with other circulatory complications: Secondary | ICD-10-CM | POA: Diagnosis not present

## 2022-02-16 DIAGNOSIS — I10 Essential (primary) hypertension: Secondary | ICD-10-CM | POA: Diagnosis not present

## 2022-02-16 DIAGNOSIS — E1169 Type 2 diabetes mellitus with other specified complication: Secondary | ICD-10-CM | POA: Diagnosis not present

## 2022-02-16 DIAGNOSIS — Z794 Long term (current) use of insulin: Secondary | ICD-10-CM | POA: Diagnosis not present

## 2022-02-16 DIAGNOSIS — J449 Chronic obstructive pulmonary disease, unspecified: Secondary | ICD-10-CM | POA: Diagnosis not present

## 2022-02-16 DIAGNOSIS — E785 Hyperlipidemia, unspecified: Secondary | ICD-10-CM | POA: Diagnosis not present

## 2022-02-16 DIAGNOSIS — E1143 Type 2 diabetes mellitus with diabetic autonomic (poly)neuropathy: Secondary | ICD-10-CM | POA: Diagnosis not present

## 2022-03-02 ENCOUNTER — Other Ambulatory Visit: Payer: Self-pay | Admitting: Nurse Practitioner

## 2022-03-02 ENCOUNTER — Ambulatory Visit (HOSPITAL_COMMUNITY)
Admission: RE | Admit: 2022-03-02 | Discharge: 2022-03-02 | Disposition: A | Discharge status: Home / Self Care | Payer: PPO | Source: Ambulatory Visit | Attending: Physician Assistant | Admitting: Physician Assistant

## 2022-03-02 DIAGNOSIS — Z87891 Personal history of nicotine dependence: Secondary | ICD-10-CM | POA: Diagnosis not present

## 2022-03-02 DIAGNOSIS — F17210 Nicotine dependence, cigarettes, uncomplicated: Secondary | ICD-10-CM | POA: Diagnosis not present

## 2022-03-04 NOTE — Progress Notes (Signed)
FYI - I am seeing him next week and can order f/u LDCT during that visit

## 2022-03-05 ENCOUNTER — Ambulatory Visit: Payer: PPO | Admitting: Physician Assistant

## 2022-03-06 ENCOUNTER — Other Ambulatory Visit: Payer: Self-pay | Admitting: Nurse Practitioner

## 2022-03-09 NOTE — Progress Notes (Signed)
[place], [place] [address]   CLINIC:  Medical Oncology/Hematology  PCP:  Glenda Chroman, MD [place] [address] [phone]   REASON FOR VISIT:  Follow-up for abnormal SPEP and erythrocytosis  PRIOR THERAPY: None  CURRENT THERAPY: Under work-up  INTERVAL HISTORY:  [name] 76 y.o. male returns for routine follow-up of his abnormal SPEP erythrocytosis.  He was seen for initial consultation by Dr. Delton Coombes and Tarri Abernethy PA-C on 02/05/2022.  At today's visit, he reports feeling fairly well.  He denies any changes in his symptoms or baseline health status since his initial visit 1 month ago.  He continues to deny any new bone pain or recent fractures.  No B symptoms or acute neurologic changes.  No vasomotor symptoms.   He has 50% energy and 85% appetite. He endorses that he is maintaining a stable weight.    REVIEW OF SYSTEMS:  Review of Systems  Constitutional:  Positive for fatigue. Negative for appetite change, chills, diaphoresis, fever and unexpected weight change.  HENT:   Negative for lump/mass and nosebleeds.   Eyes:  Negative for eye problems.  Respiratory:  Positive for cough and shortness of breath (with exertion, chronic). Negative for hemoptysis.   Cardiovascular:  Negative for chest pain, leg swelling and palpitations.  Gastrointestinal:  Positive for diarrhea and nausea. Negative for abdominal pain, blood in stool, constipation and vomiting.  Genitourinary:  Negative for hematuria.   Musculoskeletal:  Positive for arthralgias.  Skin: Negative.   Neurological:  Positive for dizziness, headaches and numbness. Negative for light-headedness.  Hematological:  Does not bruise/bleed easily.  Psychiatric/Behavioral:  Positive for sleep disturbance. The patient is nervous/anxious.       PAST MEDICAL/SURGICAL HISTORY:  Past Medical History:  Diagnosis Date   Ankylosing spondylitis (HCC)    Anxiety     Aortic root dilatation (HCC)    AR (aortic regurgitation)    Atrial fibrillation and flutter (HCC)    Bladder cancer (HCC)    Chronic pain    Follow[facility] at [facility]   COPD (chronic obstructive pulmonary disease) (HCC)    DDD (degenerative disc disease), lumbosacral    DDD (degenerative disc disease), thoracolumbar    Degenerative cervical disc    Depression    Diabetes mellitus type II    Diverticulitis    Essential hypertension    GERD (gastroesophageal reflux disease)    History of kidney stones    Hyperlipidemia    Migraine    Nonischemic cardiomyopathy ([place])    Cardiolite 12/09 LVEF 55%, minor luminal irregularities at cardiac catheterization   OSA on CPAP    Rheumatoid arthritis (HCC)    Symptomatic bradycardia    Medtronic Advisa L dual-chamber pacemaker  05/2015 - Dr. Curt Bears   Past Surgical History:  Procedure Laterality Date   CARDIAC CATHETERIZATION  08/13/11   CARDIOVERSION N/A 07/02/2015   Procedure: CARDIOVERSION;  Surgeon: Sanda Klein, MD;  Location: [facility];  Service: Cardiovascular;  Laterality: N/A;   CATARACT EXTRACTION W/PHACO Left 02/10/2019   Procedure: CATARACT EXTRACTION PHACO AND INTRAOCULAR LENS PLACEMENT ([place]);  Surgeon: Baruch Goldmann, MD;  Location: [facility];  Service: Ophthalmology;  Laterality: Left;  CDE: 7.10   CATARACT EXTRACTION W/PHACO Right 02/27/2019   Procedure: CATARACT EXTRACTION PHACO AND INTRAOCULAR LENS PLACEMENT RIGHT EYE CDE=11.84;  Surgeon: Baruch Goldmann, MD;  Location: [facility];  Service: Ophthalmology;  Laterality: Right;  right   CYSTOSCOPY WITH HOLMIUM LASER LITHOTRIPSY  1990's  EP IMPLANTABLE DEVICE N/A 05/21/2015   MDT Advisa DR MRI pacemaker implanted by Dr Curt Bears   KNEE ARTHROSCOPY Right ~ [place]  ~ 2008   TRANSURETHRAL RESECTION OF BLADDER TUMOR WITH GYRUS (TURBT-GYRUS)  1990's?     SOCIAL HISTORY:  Social History   Socioeconomic History   Marital status: Married    Spouse name: Not on file    Number of children: Not on file   Years of education: Not on file   Highest education level: Not on file  Occupational History   Occupation: Disabled    Employer: DISABLED  Tobacco Use   Smoking status: Every Day    Packs/day: 0.50    Years: 50.00    Total pack years: 25.00    Types: E-cigarettes, Cigarettes    Start date: 02/26/1959   Smokeless tobacco: Former    Types: Chew   Tobacco comments:    05/21/2015 "quit chewing in 1992"  Vaping Use   Vaping Use: Every day   Substances: Nicotine, Flavoring  Substance and Sexual Activity   Alcohol use: Yes    Alcohol/week: 2.0 standard drinks of alcohol    Types: 2 Cans of beer per week   Drug use: Yes    Types: Cocaine    Comment: 05/21/2015 "quit in the early 2000"   Sexual activity: Not on file  Other Topics Concern   Not on file  Social History Narrative   Divorced   No regular exercise   Social Determinants of Health   Financial Resource Strain: Not on file  Food Insecurity: Not on file  Transportation Needs: Not on file  Physical Activity: Not on file  Stress: Not on file  Social Connections: Not on file  Intimate Partner Violence: Not on file    FAMILY HISTORY:  Family History  Problem Relation Age of Onset   Heart attack Mother    Cancer Father     CURRENT MEDICATIONS:  Outpatient Encounter Medications as of 03/10/2022  Medication Sig   acetaminophen (TYLENOL) 500 MG tablet Take 500 mg by mouth every 6 (six) hours as needed (pain).    albuterol (PROVENTIL) (2.5 MG/3ML) 0.083% nebulizer solution Take 2.5 mg by nebulization every 6 (six) hours as needed for wheezing.   albuterol (VENTOLIN HFA) 108 (90 Base) MCG/ACT inhaler SMARTSIG:1-2 Puff(s) Via Inhaler Every 4-6 Hours PRN   Alcohol Swabs 70 % PADS Use as directed   ALPRAZolam (XANAX) 0.5 MG tablet Take 0.5 mg by mouth 2 (two) times daily as needed.   BD PEN NEEDLE NANO U/F 32G X 4 MM MISC USE AS DIRECTED FOUR TIMES DAILY   calcitRIOL (ROCALTROL) 0.25 MCG  capsule Take 0.25 mcg by mouth 3 (three) times a week.   carvedilol (COREG) 6.25 MG tablet TAKE ONE TABLET BY MOUTH TWICE DAILY WITH MEALS   Cholecalciferol (VITAMIN D-3) 1000 UNITS CAPS Take 1,000 Units by mouth 2 (two) times daily.    cholestyramine light (PREVALITE) 4 g packet SMARTSIG:1 Packet(s) By Mouth Twice Daily PRN   citalopram (CELEXA) 40 MG tablet Take 20-40 mg by mouth daily.    Continuous Blood Gluc Receiver (FREESTYLE LIBRE 2 READER) DEVI USE WITH SENSORS TO MONITOR BLOOD SUGAR.   Continuous Blood Gluc Sensor (FREESTYLE LIBRE 2 SENSOR) MISC APPLY ONE SENSOR EVERY 14 DAYS AS DIRECTED   ELIQUIS 5 MG TABS tablet TAKE ONE TABLET BY MOUTH TWICE DAILY   ENTRESTO 24-26 MG TAKE ONE TABLET BY MOUTH TWICE DAILY   furosemide (LASIX)  40 MG tablet Take 2 tablets (80 mg total) by mouth 3 (three) times a week AND 1 tablet (40 mg total) 4 (four) times a week. (per Dr. Theador Hawthorne instructions) FUTURE REFILLS PER BHUTANI SINCE HE HAS MADE RECENT CHANGES, WIFE WILL CONTACT THIS PROVIDER.   gabapentin (NEURONTIN) 400 MG capsule Take 400 mg by mouth 3 (three) times daily.    IBU 800 MG tablet Take 800 mg by mouth every 6 (six) hours as needed.   insulin aspart (NOVOLOG FLEXPEN) 100 UNIT/ML FlexPen Inject 20-26 Units into the skin 3 (three) times daily with meals.   insulin glargine (LANTUS SOLOSTAR) 100 UNIT/ML Solostar Pen Inject 70 Units into the skin at bedtime.   Insulin Pen Needle (B-D ULTRAFINE III SHORT PEN) 31G X 8 MM MISC Use to inject insulin 4 x daily   ipratropium-albuterol (DUONEB) 0.5-2.5 (3) MG/3ML SOLN Take 3 mLs by nebulization every 4 (four) hours as needed (for shortness of breath).   Lancets (FREESTYLE) lancets USE TO CHECK BLOOD SUGAR FOUR TIMES DAILY.   magnesium oxide (MAG-OX) 400 (240 Mg) MG tablet Take 1 tablet by mouth daily.   metoCLOPramide (REGLAN) 5 MG tablet Take 5 mg by mouth every 6 (six) hours as needed for nausea or vomiting.   nitroGLYCERIN (NITROSTAT) 0.4 MG SL tablet  Place 1 tablet (0.4 mg total) under the tongue every 5 (five) minutes as needed for chest pain (if no relief after 2nd dose, proceed to the ED for an evaluation or call 911).   NON FORMULARY 1 each by Other route See admin instructions. CPAP - Uses nightly   simvastatin (ZOCOR) 40 MG tablet TAKE ONE TABLET BY MOUTH AT BEDTIME   traMADol (ULTRAM) 50 MG tablet Take 1 tablet by mouth daily as needed (pain).    TRELEGY ELLIPTA 100-62.5-25 MCG/INH AEPB Inhale 1 puff into the lungs daily.   triamcinolone cream (KENALOG) 0.1 % Apply 1 application topically 2 (two) times daily as needed.   vitamin B-12 (CYANOCOBALAMIN) 1000 MCG tablet Take 1,000 mcg by mouth 2 (two) times daily.   VOLTAREN 1 % GEL Apply 2 g topically 4 (four) times daily as needed (for pain).    No facility-administered encounter medications on file as of 03/10/2022.    ALLERGIES:  No Known Allergies   PHYSICAL EXAM:  ECOG PERFORMANCE STATUS: 1 - Symptomatic but completely ambulatory  There were no vitals filed for this visit. There were no vitals filed for this visit. Physical Exam Constitutional:      Appearance: Normal appearance. He is obese.  HENT:     Head: Normocephalic and atraumatic.     Mouth/Throat:     Mouth: Mucous membranes are moist.  Eyes:     Extraocular Movements: Extraocular movements intact.     Pupils: Pupils are equal, round, and reactive to light.  Cardiovascular:     Rate and Rhythm: Normal rate and regular rhythm.     Pulses: Normal pulses.     Heart sounds: Normal heart sounds.  Pulmonary:     Effort: Pulmonary effort is normal.     Breath sounds: Normal breath sounds.     Comments: Quiet lungs Abdominal:     General: Bowel sounds are normal.     Palpations: Abdomen is soft.     Tenderness: There is no abdominal tenderness.  Musculoskeletal:        General: No swelling.     Right lower leg: No edema.     Left lower leg: No edema.  Lymphadenopathy:     Cervical: No cervical adenopathy.   Skin:    General: Skin is warm and dry.  Neurological:     General: No focal deficit present.     Mental Status: He is alert and oriented to person, place, and time.  Psychiatric:        Mood and Affect: Mood normal.        Behavior: Behavior normal.     LABORATORY DATA:  I have reviewed the labs as listed.  CBC    Component Value Date/Time   WBC 9.9 02/05/2022 1408   RBC 5.84 (H) 02/05/2022 1408   HGB 16.6 02/05/2022 1408   HCT 49.5 02/05/2022 1408   PLT 141 (L) 02/05/2022 1408   MCV 84.8 02/05/2022 1408   MCH 28.4 02/05/2022 1408   MCHC 33.5 02/05/2022 1408   RDW 13.9 02/05/2022 1408   LYMPHSABS 3.5 02/05/2022 1408   MONOABS 0.9 02/05/2022 1408   EOSABS 0.2 02/05/2022 1408   BASOSABS 0.1 02/05/2022 1408      Latest Ref Rng & Units 02/05/2022    2:08 PM 12/03/2021   12:00 AM 06/25/2020   12:00 AM  CMP  Glucose 70 - 99 mg/dL 249     BUN 8 - 23 mg/dL '17  13  25      '$ Creatinine 0.61 - 1.24 mg/dL 1.75  1.7  1.6      Sodium 135 - 145 mmol/L 137  137    Potassium 3.5 - 5.1 mmol/L 4.5  4.7    Chloride 98 - 111 mmol/L 102  102    CO2 22 - 32 mmol/L 27  28    Calcium 8.9 - 10.3 mg/dL 8.9  8.8  9.2      Total Protein 6.5 - 8.1 g/dL 6.8     Total Bilirubin 0.3 - 1.2 mg/dL 1.2     Alkaline Phos 38 - 126 U/L 63  74    AST 15 - 41 U/L 16  18    ALT 0 - 44 U/L 14  20       This result is from an external source.    DIAGNOSTIC IMAGING:  I have independently reviewed the relevant imaging and discussed with the patient.  ASSESSMENT & PLAN: 1.  Abnormal SPEP - Seen at the request of his nephrologist (Dr. Theador Hawthorne) for evaluation of erythrocytosis and abnormal SPEP. - Labs sent by nephrologist: SPEP (01/07/2022) showed slight irregularity in the gamma region, and immunofixation showed faint monoclonal protein types as IgM kappa, but with concentration too low to accurately quantify.  Urine immunofixation showed polyclonal light chains, but no monoclonal immunoglobulin or free  light chain. - Hematology work-up (02/05/2022): Immunofixation unremarkable. SPEP negative for M spike or any other evidence of monoclonal protein. Elevated kappa free light chain 49.1, elevated lambda 27.5, with mildly elevated ratio 1.79 (in keeping with underlying CKD stage IIIb) Mildly elevated beta-2 microglobulin 3.8.  Normal LDH. Creatinine 1.75/GFR 40 (baseline CKD stage IIIb), normal calcium 8.9. Hgb 16.6. - No new bone pain, fractures, B symptoms, or neurologic changes.  - PLAN: No signs of MGUS or myeloma at this time.  We will repeat labs every 6 months for the next year to rule out any developing plasma cell dyscrasia.   2.  Secondary polycythemia - Seen at the request of his nephrologist (Dr. Theador Hawthorne) for evaluation of erythrocytosis and abnormal SPEP. - Elevated hemoglobin has dated back to at least December 2020.  CBC from 03/13/2021 showed  Hgb 18.0/HCT 52.8.  CBC from 01/07/2022 showed Hgb 17.5/HCT 51.9. - Hematology work-up (02/05/2022): Erythropoietin normal at 10.5 JAK2 with reflex to CALR and MPL is negative -Most recent CBC (02/05/2022): Hgb 16.6 with hematocrit 49.5 - Smokes 1 PPD cigarettes since age 48. - Diagnosed with OSA, but noncompliant with CPAP - High-dose diuretics with Lasix 40 mg twice daily - Underlying cardiopulmonary disease with COPD and CHF - No vasomotor symptoms or B symptoms.  - Patient takes Eliquis for atrial fibrillation - Differential diagnosis favors secondary polycythemia in the setting of sleep apnea, cigarette use, COPD, and CHF.  May also have an aspect of hemoconcentration in the setting of diuretics. - PLAN: No need for treatment at this time.  We will continue to monitor with repeat CBC and follow-up visit in 6 months. - We have discussed that there is little role for therapeutic phlebotomy except in cases of severe symptoms or HCT >55% - No need for aspirin, as patient takes Eliquis for atrial fibrillation   3.  Tobacco abuse and lung nodule   - This patient meets criteria for low-dose CT lung cancer screening (61-pack-year history, current everyday smoker 1 PPD, age 73, no symptoms of active lung cancer) - LDCT chest (03/02/2022): Right middle lobe solid 6.5 mm pulmonary nodule = lung RADS 3, probably benign findings (short-term follow-up with 35-monthCT recommended).  Incidental findings include coronary atherosclerosis, aortic atherosclerosis, emphysema, and mild right paratracheal lymphadenopathy. - PLAN: CT findings discussed with patient. - Counseling provided regarding smoking cessation. - We will repeat LDCT chest for lung nodule follow-up in 6 months. - We will refer to pulmonology for follow-up of other findings as noted in report.  4.  Other history - PMH: CKD stage IIIb (diabetic nephropathy), type 2 diabetes mellitus, systolic CHF, obesity, hypertension, atrial fibrillation (Eliquis), GERD, sleep apnea and COPD. - SOCIAL:  Retired tAdministrator  He lives at home with his wife and is independent with ADLs and ambulation.  He has been smoking 1 PPD cigarettes for the past 60 years (since age 76.  He has a history of heavy drinking in his 243s but reports only occasional alcohol consumption now.  Denies any illicit drug use. - FAMILY: negative for any known blood disorders or cancer.    PLAN SUMMARY & DISPOSITION: Labs in 6 months Low-dose CT chest for lung nodule follow-up in 6 months Referral to pulmonology Office visit in 6 months, after labs/CT scan  All questions were answered. The patient knows to call the clinic with any problems, questions or concerns.  Medical decision making: Moderate  Time spent on visit: I spent 20 minutes counseling the patient face to face. The total time spent in the appointment was 30 minutes and more than 50% was on counseling.   RHarriett Rush PA-C  03/10/2022 3:39 PM

## 2022-03-10 ENCOUNTER — Inpatient Hospital Stay: Payer: PPO | Attending: Hematology | Admitting: Physician Assistant

## 2022-03-10 ENCOUNTER — Other Ambulatory Visit: Payer: Self-pay | Admitting: Cardiology

## 2022-03-10 VITALS — BP 152/67 | HR 99 | Temp 97.6°F | Resp 18 | Ht 69.0 in | Wt 235.6 lb

## 2022-03-10 DIAGNOSIS — I4891 Unspecified atrial fibrillation: Secondary | ICD-10-CM | POA: Insufficient documentation

## 2022-03-10 DIAGNOSIS — G4733 Obstructive sleep apnea (adult) (pediatric): Secondary | ICD-10-CM | POA: Insufficient documentation

## 2022-03-10 DIAGNOSIS — R918 Other nonspecific abnormal finding of lung field: Secondary | ICD-10-CM | POA: Insufficient documentation

## 2022-03-10 DIAGNOSIS — F17210 Nicotine dependence, cigarettes, uncomplicated: Secondary | ICD-10-CM | POA: Insufficient documentation

## 2022-03-10 DIAGNOSIS — Z79899 Other long term (current) drug therapy: Secondary | ICD-10-CM | POA: Diagnosis not present

## 2022-03-10 DIAGNOSIS — I110 Hypertensive heart disease with heart failure: Secondary | ICD-10-CM | POA: Diagnosis not present

## 2022-03-10 DIAGNOSIS — R778 Other specified abnormalities of plasma proteins: Secondary | ICD-10-CM | POA: Diagnosis not present

## 2022-03-10 DIAGNOSIS — R911 Solitary pulmonary nodule: Secondary | ICD-10-CM | POA: Diagnosis not present

## 2022-03-10 DIAGNOSIS — J449 Chronic obstructive pulmonary disease, unspecified: Secondary | ICD-10-CM | POA: Insufficient documentation

## 2022-03-10 DIAGNOSIS — E1140 Type 2 diabetes mellitus with diabetic neuropathy, unspecified: Secondary | ICD-10-CM | POA: Insufficient documentation

## 2022-03-10 DIAGNOSIS — N1832 Chronic kidney disease, stage 3b: Secondary | ICD-10-CM | POA: Insufficient documentation

## 2022-03-10 DIAGNOSIS — D751 Secondary polycythemia: Secondary | ICD-10-CM | POA: Insufficient documentation

## 2022-03-10 DIAGNOSIS — Z7901 Long term (current) use of anticoagulants: Secondary | ICD-10-CM | POA: Diagnosis not present

## 2022-03-10 DIAGNOSIS — I509 Heart failure, unspecified: Secondary | ICD-10-CM | POA: Insufficient documentation

## 2022-03-10 DIAGNOSIS — F17290 Nicotine dependence, other tobacco product, uncomplicated: Secondary | ICD-10-CM | POA: Insufficient documentation

## 2022-03-10 DIAGNOSIS — E1122 Type 2 diabetes mellitus with diabetic chronic kidney disease: Secondary | ICD-10-CM | POA: Insufficient documentation

## 2022-03-10 DIAGNOSIS — Z794 Long term (current) use of insulin: Secondary | ICD-10-CM | POA: Diagnosis not present

## 2022-03-10 NOTE — Patient Instructions (Signed)
[facility] **   You were seen today by Tarri Abernethy PA-C for your abnormal protein levels and elevated blood count.    ABNORMAL PROTEIN Labs from Dr. Theador Hawthorne show slightly increased amount of protein, which may be a sign of a condition called "MGUS."  As previously discussed, MGUS is a precancerous condition that can lead to a type of cancer called multiple myeloma in about 1% of patients each year. We checked the same labs, and it did NOT show any sign of MGUS or abnormal protein. We will recheck these labs again in 6 months to make sure we do not see any new evidence of MGUS.    ELEVATED RED BLOOD CELLS Your elevated red blood cells (called erythrocytosis or polycythemia) are due to your other underlying health conditions (COPD, sleep apnea, smoking, and Lasix) Elevated red blood cells places you at risk of blood clots, heart attack, and stroke. We checked labs at your last visit, which did not show any genetic mutation causing elevated red blood cells. The best way to treat your elevated red blood cells is to treat the underlying condition causing them to be high. Stop smoking. Wear CPAP. Treat your COPD.  SMOKING & LUNG NODULE: Continue to keep up the great work on smoking cessation!  The CT scan of your lungs showed a small nodule in your right lung.  We will check another CT scan of your lung in 6 months to see if the nodule has changed size.    FOLLOW-UP APPOINTMENT: Labs and CT scan in 6 months with office visit 1 week after labs/CT.  ** Thank you for trusting me with your healthcare!  I strive to provide all of my patients with quality care at each visit.  If you receive a survey for this visit, I would be so grateful to you for taking the time to provide feedback.  Thank you in advance!  ~ Dylan Tyler                   Dr. Derek Jack   &   Tarri Abernethy, PA-C   - - - - - - - - - - - - -  - - - - -    Thank you for choosing [place] at [facility] to provide your oncology and hematology care.  To afford each patient quality time with our provider, please arrive at least 15 minutes before your scheduled appointment time.   If you have a lab appointment with the [facility] please come in thru the Main Entrance and check in at the main information desk.  You need to re-schedule your appointment should you arrive 10 or more minutes late.  We strive to give you quality time with our providers, and arriving late affects you and other patients whose appointments are after yours.  Also, if you no show three or more times for appointments you may be dismissed from the clinic at the providers discretion.     Again, thank you for choosing [facility].  Our hope is that these requests will decrease the amount of time that you wait before being seen by our physicians.       _____________________________________________________________  Should you have questions after your visit to [facility], please contact our office at [phone] and follow the prompts.  Our office hours are 8:00 a.m. and 4:30 p.m. Monday -  Friday.  Please note that voicemails left after 4:00 p.m. may not be returned until the following business day.  We are closed weekends and major holidays.  You do have access to a nurse 24-7, just call the main number to the clinic [phone] and do not press any options, hold on the line and a nurse will answer the phone.    For prescription refill requests, have your pharmacy contact our office and allow 72 hours.

## 2022-03-13 DIAGNOSIS — M459 Ankylosing spondylitis of unspecified sites in spine: Secondary | ICD-10-CM | POA: Diagnosis not present

## 2022-03-13 DIAGNOSIS — Z299 Encounter for prophylactic measures, unspecified: Secondary | ICD-10-CM | POA: Diagnosis not present

## 2022-03-13 DIAGNOSIS — I10 Essential (primary) hypertension: Secondary | ICD-10-CM | POA: Diagnosis not present

## 2022-03-13 DIAGNOSIS — J449 Chronic obstructive pulmonary disease, unspecified: Secondary | ICD-10-CM | POA: Diagnosis not present

## 2022-03-13 DIAGNOSIS — Z79899 Other long term (current) drug therapy: Secondary | ICD-10-CM | POA: Diagnosis not present

## 2022-03-13 DIAGNOSIS — E1165 Type 2 diabetes mellitus with hyperglycemia: Secondary | ICD-10-CM | POA: Diagnosis not present

## 2022-03-13 DIAGNOSIS — F419 Anxiety disorder, unspecified: Secondary | ICD-10-CM | POA: Diagnosis not present

## 2022-03-16 ENCOUNTER — Other Ambulatory Visit: Payer: Self-pay | Admitting: Cardiology

## 2022-03-27 DIAGNOSIS — I25119 Atherosclerotic heart disease of native coronary artery with unspecified angina pectoris: Secondary | ICD-10-CM | POA: Diagnosis not present

## 2022-03-27 DIAGNOSIS — Z299 Encounter for prophylactic measures, unspecified: Secondary | ICD-10-CM | POA: Diagnosis not present

## 2022-03-27 DIAGNOSIS — N183 Chronic kidney disease, stage 3 unspecified: Secondary | ICD-10-CM | POA: Diagnosis not present

## 2022-03-27 DIAGNOSIS — J441 Chronic obstructive pulmonary disease with (acute) exacerbation: Secondary | ICD-10-CM | POA: Diagnosis not present

## 2022-03-27 DIAGNOSIS — M459 Ankylosing spondylitis of unspecified sites in spine: Secondary | ICD-10-CM | POA: Diagnosis not present

## 2022-04-07 ENCOUNTER — Ambulatory Visit (INDEPENDENT_AMBULATORY_CARE_PROVIDER_SITE_OTHER): Payer: PPO

## 2022-04-07 DIAGNOSIS — I442 Atrioventricular block, complete: Secondary | ICD-10-CM

## 2022-04-08 DIAGNOSIS — D6869 Other thrombophilia: Secondary | ICD-10-CM | POA: Diagnosis not present

## 2022-04-08 DIAGNOSIS — I2510 Atherosclerotic heart disease of native coronary artery without angina pectoris: Secondary | ICD-10-CM | POA: Diagnosis not present

## 2022-04-08 DIAGNOSIS — E785 Hyperlipidemia, unspecified: Secondary | ICD-10-CM | POA: Diagnosis not present

## 2022-04-08 DIAGNOSIS — Z6835 Body mass index (BMI) 35.0-35.9, adult: Secondary | ICD-10-CM | POA: Diagnosis not present

## 2022-04-08 DIAGNOSIS — I4821 Permanent atrial fibrillation: Secondary | ICD-10-CM | POA: Diagnosis not present

## 2022-04-08 DIAGNOSIS — I10 Essential (primary) hypertension: Secondary | ICD-10-CM | POA: Diagnosis not present

## 2022-04-08 DIAGNOSIS — Z7901 Long term (current) use of anticoagulants: Secondary | ICD-10-CM | POA: Diagnosis not present

## 2022-04-08 DIAGNOSIS — I739 Peripheral vascular disease, unspecified: Secondary | ICD-10-CM | POA: Diagnosis not present

## 2022-04-09 ENCOUNTER — Other Ambulatory Visit: Payer: Self-pay | Admitting: Cardiology

## 2022-04-13 LAB — CUP PACEART REMOTE DEVICE CHECK
Battery Remaining Longevity: 8 mo
Battery Voltage: 2.87 V
Brady Statistic RA Percent Paced: 0 %
Brady Statistic RV Percent Paced: 95.28 %
Date Time Interrogation Session: 20231006164427
Implantable Lead Implant Date: 20161115
Implantable Lead Implant Date: 20161115
Implantable Lead Location: 753859
Implantable Lead Location: 753860
Implantable Lead Model: 5076
Implantable Lead Model: 5076
Implantable Pulse Generator Implant Date: 20161115
Lead Channel Impedance Value: 361 Ohm
Lead Channel Impedance Value: 380 Ohm
Lead Channel Impedance Value: 456 Ohm
Lead Channel Impedance Value: 513 Ohm
Lead Channel Pacing Threshold Amplitude: 0.5 V
Lead Channel Pacing Threshold Amplitude: 0.875 V
Lead Channel Pacing Threshold Pulse Width: 0.4 ms
Lead Channel Pacing Threshold Pulse Width: 0.4 ms
Lead Channel Sensing Intrinsic Amplitude: 24 mV
Lead Channel Sensing Intrinsic Amplitude: 24 mV
Lead Channel Sensing Intrinsic Amplitude: 5.75 mV
Lead Channel Sensing Intrinsic Amplitude: 5.75 mV
Lead Channel Setting Pacing Amplitude: 2.5 V
Lead Channel Setting Pacing Pulse Width: 0.4 ms
Lead Channel Setting Sensing Sensitivity: 2.8 mV

## 2022-04-14 ENCOUNTER — Institutional Professional Consult (permissible substitution): Payer: PPO | Admitting: Internal Medicine

## 2022-04-14 NOTE — Progress Notes (Deleted)
Dylan Tyler, male    DOB: 05/04/1946    MRN: [id number]   Brief patient profile:  ***  yo*** *** referred to pulmonary clinic in [place]  04/14/2022 by *** for ***      History of Present Illness  04/14/2022  Pulmonary/ 1st office eval/ Melvyn Novas / [place] on [place] No chief complaint on file.    Dyspnea:  *** Cough: *** Sleep: *** SABA use:   Past Medical History:  Diagnosis Date   Ankylosing spondylitis (HCC)    Anxiety    Aortic root dilatation (HCC)    AR (aortic regurgitation)    Atrial fibrillation and flutter (HCC)    Bladder cancer (HCC)    Chronic pain    Followed at [facility]   COPD (chronic obstructive pulmonary disease) (HCC)    DDD (degenerative disc disease), lumbosacral    DDD (degenerative disc disease), thoracolumbar    Degenerative cervical disc    Depression    Diabetes mellitus type II    Diverticulitis    Essential hypertension    GERD (gastroesophageal reflux disease)    History of kidney stones    Hyperlipidemia    Migraine    Nonischemic cardiomyopathy ([place])    Cardiolite 12/09 LVEF 55%, minor luminal irregularities at cardiac catheterization   OSA on CPAP    Rheumatoid arthritis (HCC)    Symptomatic bradycardia    Medtronic Advisa L dual-chamber pacemaker  05/2015 - Dr. Curt Bears    Outpatient Medications Prior to Visit  Medication Sig Dispense Refill   acetaminophen (TYLENOL) 500 MG tablet Take 500 mg by mouth every 6 (six) hours as needed (pain).      albuterol (PROVENTIL) (2.5 MG/3ML) 0.083% nebulizer solution Take 2.5 mg by nebulization every 6 (six) hours as needed for wheezing.     albuterol (VENTOLIN HFA) 108 (90 Base) MCG/ACT inhaler SMARTSIG:1-2 Puff(s) Via Inhaler Every 4-6 Hours PRN     Alcohol Swabs 70 % PADS Use as directed 120 each 2   ALPRAZolam (XANAX) 0.5 MG tablet Take 0.5 mg by mouth 2 (two) times daily as needed.  1   BD PEN NEEDLE NANO U/F 32G X 4 MM MISC USE AS DIRECTED FOUR TIMES DAILY 200 each 2    calcitRIOL (ROCALTROL) 0.25 MCG capsule Take 0.25 mcg by mouth 3 (three) times a week.     carvedilol (COREG) 6.25 MG tablet TAKE ONE TABLET BY MOUTH TWICE DAILY WITH MEALS 180 tablet 3   Cholecalciferol (VITAMIN D-3) 1000 UNITS CAPS Take 1,000 Units by mouth 2 (two) times daily.      cholestyramine light (PREVALITE) 4 g packet SMARTSIG:1 Packet(s) By Mouth Twice Daily PRN     citalopram (CELEXA) 40 MG tablet Take 20-40 mg by mouth daily.      Continuous Blood Gluc Receiver (FREESTYLE LIBRE 2 READER) DEVI USE WITH SENSORS TO MONITOR BLOOD SUGAR. 1 each 0   Continuous Blood Gluc Sensor (FREESTYLE LIBRE 2 SENSOR) MISC APPLY ONE SENSOR EVERY 14 DAYS AS DIRECTED 2 each 2   ELIQUIS 5 MG TABS tablet TAKE ONE TABLET BY MOUTH TWICE DAILY 60 tablet 6   ENTRESTO 24-26 MG TAKE ONE TABLET BY MOUTH TWICE DAILY 240 tablet 1   furosemide (LASIX) 40 MG tablet TAKE TWO (2) TABLETS BY MOUTH THREE TIMES WEEKLY AND ONE TABLET FOUR TIMES WEEKLY. 40 tablet 1   gabapentin (NEURONTIN) 400 MG capsule Take 400 mg by mouth 3 (three) times daily.      IBU 800 MG  tablet Take 800 mg by mouth every 6 (six) hours as needed.     insulin glargine (LANTUS SOLOSTAR) 100 UNIT/ML Solostar Pen Inject 70 Units into the skin at bedtime. 30 mL 3   Insulin Pen Needle (B-D ULTRAFINE III SHORT PEN) 31G X 8 MM MISC Use to inject insulin 4 x daily 200 each 3   ipratropium-albuterol (DUONEB) 0.5-2.5 (3) MG/3ML SOLN Take 3 mLs by nebulization every 4 (four) hours as needed (for shortness of breath).     Lancets (FREESTYLE) lancets USE TO CHECK BLOOD SUGAR FOUR TIMES DAILY. 200 each 5   magnesium oxide (MAG-OX) 400 (240 Mg) MG tablet Take 1 tablet by mouth daily.     metoCLOPramide (REGLAN) 5 MG tablet Take 5 mg by mouth every 6 (six) hours as needed for nausea or vomiting.     nitroGLYCERIN (NITROSTAT) 0.4 MG SL tablet DISSOLVE ONE TABLET UNDER TONGUE EVERY 5 MINUTES UP TO 3 DOSES AS NEEDED FOR CHEST PAIN (IF NO RELIEF AFTER 2ND DOSE GO TO ED OR  CALL 911) 25 tablet 3   NON FORMULARY 1 each by Other route See admin instructions. CPAP - Uses nightly     NOVOLOG FLEXPEN 100 UNIT/ML FlexPen Inject 20-26 Units into the skin 3 (three) times daily with meals. 75 mL 0   simvastatin (ZOCOR) 40 MG tablet TAKE ONE TABLET BY MOUTH AT BEDTIME 90 tablet 3   traMADol (ULTRAM) 50 MG tablet Take 1 tablet by mouth daily as needed (pain).      TRELEGY ELLIPTA 100-62.5-25 MCG/INH AEPB Inhale 1 puff into the lungs daily.     triamcinolone cream (KENALOG) 0.1 % Apply 1 application topically 2 (two) times daily as needed.     vitamin B-12 (CYANOCOBALAMIN) 1000 MCG tablet Take 1,000 mcg by mouth 2 (two) times daily.     VOLTAREN 1 % GEL Apply 2 g topically 4 (four) times daily as needed (for pain).      No facility-administered medications prior to visit.     Objective:     There were no vitals taken for this visit.         Assessment   No problem-specific Assessment & Plan notes found for this encounter.     Christinia Gully, MD 04/14/2022

## 2022-04-16 DIAGNOSIS — D751 Secondary polycythemia: Secondary | ICD-10-CM | POA: Diagnosis not present

## 2022-04-16 DIAGNOSIS — N189 Chronic kidney disease, unspecified: Secondary | ICD-10-CM | POA: Diagnosis not present

## 2022-04-16 DIAGNOSIS — I429 Cardiomyopathy, unspecified: Secondary | ICD-10-CM | POA: Diagnosis not present

## 2022-04-16 DIAGNOSIS — E1122 Type 2 diabetes mellitus with diabetic chronic kidney disease: Secondary | ICD-10-CM | POA: Diagnosis not present

## 2022-04-16 DIAGNOSIS — E211 Secondary hyperparathyroidism, not elsewhere classified: Secondary | ICD-10-CM | POA: Diagnosis not present

## 2022-04-16 DIAGNOSIS — D472 Monoclonal gammopathy: Secondary | ICD-10-CM | POA: Diagnosis not present

## 2022-04-22 NOTE — Progress Notes (Signed)
Remote pacemaker transmission.   

## 2022-05-04 ENCOUNTER — Ambulatory Visit (INDEPENDENT_AMBULATORY_CARE_PROVIDER_SITE_OTHER): Payer: PPO | Admitting: Nurse Practitioner

## 2022-05-04 ENCOUNTER — Encounter: Payer: Self-pay | Admitting: Nurse Practitioner

## 2022-05-04 VITALS — BP 124/68 | HR 89 | Ht 69.0 in | Wt 240.8 lb

## 2022-05-04 DIAGNOSIS — E1122 Type 2 diabetes mellitus with diabetic chronic kidney disease: Secondary | ICD-10-CM

## 2022-05-04 DIAGNOSIS — Z794 Long term (current) use of insulin: Secondary | ICD-10-CM

## 2022-05-04 DIAGNOSIS — E782 Mixed hyperlipidemia: Secondary | ICD-10-CM

## 2022-05-04 DIAGNOSIS — I10 Essential (primary) hypertension: Secondary | ICD-10-CM

## 2022-05-04 DIAGNOSIS — N1832 Chronic kidney disease, stage 3b: Secondary | ICD-10-CM

## 2022-05-04 DIAGNOSIS — F17200 Nicotine dependence, unspecified, uncomplicated: Secondary | ICD-10-CM | POA: Diagnosis not present

## 2022-05-04 LAB — POCT GLYCOSYLATED HEMOGLOBIN (HGB A1C): Hemoglobin A1C: 8.1 % — AB (ref 4.0–5.6)

## 2022-05-04 MED ORDER — LANTUS SOLOSTAR 100 UNIT/ML ~~LOC~~ SOPN: 70.0000 [IU] | PEN_INJECTOR | Freq: Every day | SUBCUTANEOUS | 3 refills | Status: DC

## 2022-05-04 NOTE — Progress Notes (Signed)
05/04/2022            Endocrinology follow-up note   Subjective:    Patient ID: Dylan Tyler, male    DOB: 03-Jun-1946.  he is being seen in follow-up  for management of currently uncontrolled, complicated type 2 diabetes, hyperlipidemia, hypertension.   PMD:  Glenda Chroman, MD.   Past Medical History:  Diagnosis Date   Ankylosing spondylitis ([place])    Anxiety    Aortic root dilatation (HCC)    AR (aortic regurgitation)    Atrial fibrillation and flutter (HCC)    Bladder cancer (HCC)    Chronic pain    Followed at [facility]   COPD (chronic obstructive pulmonary disease) (HCC)    DDD (degenerative disc disease), lumbosacral    DDD (degenerative disc disease), thoracolumbar    Degenerative cervical disc    Depression    Diabetes mellitus type II    Diverticulitis    Essential hypertension    GERD (gastroesophageal reflux disease)    History of kidney stones    Hyperlipidemia    Migraine    Nonischemic cardiomyopathy ([place])    Cardiolite 12/09 LVEF 55%, minor luminal irregularities at cardiac catheterization   OSA on CPAP    Rheumatoid arthritis (HCC)    Symptomatic bradycardia    Medtronic Advisa L dual-chamber pacemaker  05/2015 - Dr. Curt Bears   Past Surgical History:  Procedure Laterality Date   CARDIAC CATHETERIZATION  08/13/11   CARDIOVERSION N/A 07/02/2015   Procedure: CARDIOVERSION;  Surgeon: Sanda Klein, MD;  Location: [facility];  Service: Cardiovascular;  Laterality: N/A;   CATARACT EXTRACTION W/PHACO Left 02/10/2019   Procedure: CATARACT EXTRACTION PHACO AND INTRAOCULAR LENS PLACEMENT ([place]);  Surgeon: Baruch Goldmann, MD;  Location: [facility];  Service: Ophthalmology;  Laterality: Left;  CDE: 7.10   CATARACT EXTRACTION W/PHACO Right 02/27/2019   Procedure: CATARACT EXTRACTION PHACO AND INTRAOCULAR LENS PLACEMENT RIGHT EYE CDE=11.84;  Surgeon: Baruch Goldmann, MD;  Location: [facility];  Service: Ophthalmology;  Laterality: Right;  right   CYSTOSCOPY WITH HOLMIUM LASER  LITHOTRIPSY  1990's   EP IMPLANTABLE DEVICE N/A 05/21/2015   MDT Advisa DR MRI pacemaker implanted by Dr Curt Bears   KNEE ARTHROSCOPY Right ~ [place]  ~ 2008   TRANSURETHRAL RESECTION OF BLADDER TUMOR WITH GYRUS (TURBT-GYRUS)  1990's?   Social History   Socioeconomic History   Marital status: Married    Spouse name: Not on file   Number of children: Not on file   Years of education: Not on file   Highest education level: Not on file  Occupational History   Occupation: Disabled    Employer: DISABLED  Tobacco Use   Smoking status: Every Day    Packs/day: 0.50    Years: 50.00    Total pack years: 25.00    Types: E-cigarettes, Cigarettes    Start date: 02/26/1959   Smokeless tobacco: Former    Types: Chew   Tobacco comments:    05/21/2015 "quit chewing in 1992"  Vaping Use   Vaping Use: Every day   Substances: Nicotine, Flavoring  Substance and Sexual Activity   Alcohol use: Yes    Alcohol/week: 2.0 standard drinks of alcohol    Types: 2 Cans of beer per week   Drug use: Yes    Types: Cocaine    Comment: 05/21/2015 "quit in the early 2000"   Sexual activity: Not on file  Other Topics Concern   Not on file  Social  History Narrative   Divorced   No regular exercise   Social Determinants of Health   Financial Resource Strain: Not on file  Food Insecurity: Not on file  Transportation Needs: Not on file  Physical Activity: Not on file  Stress: Not on file  Social Connections: Not on file   Outpatient Encounter Medications as of 05/04/2022  Medication Sig   acetaminophen (TYLENOL) 500 MG tablet Take 500 mg by mouth every 6 (six) hours as needed (pain).    albuterol (PROVENTIL) (2.5 MG/3ML) 0.083% nebulizer solution Take 2.5 mg by nebulization every 6 (six) hours as needed for wheezing.   albuterol (VENTOLIN HFA) 108 (90 Base) MCG/ACT inhaler SMARTSIG:1-2 Puff(s) Via Inhaler Every 4-6 Hours PRN   Alcohol Swabs 70 % PADS Use as directed    ALPRAZolam (XANAX) 0.5 MG tablet Take 0.5 mg by mouth 2 (two) times daily as needed.   BD PEN NEEDLE NANO U/F 32G X 4 MM MISC USE AS DIRECTED FOUR TIMES DAILY   calcitRIOL (ROCALTROL) 0.25 MCG capsule Take 0.25 mcg by mouth 3 (three) times a week.   carvedilol (COREG) 6.25 MG tablet TAKE ONE TABLET BY MOUTH TWICE DAILY WITH MEALS   Cholecalciferol (VITAMIN D-3) 1000 UNITS CAPS Take 1,000 Units by mouth 2 (two) times daily.    cholestyramine light (PREVALITE) 4 g packet SMARTSIG:1 Packet(s) By Mouth Twice Daily PRN   citalopram (CELEXA) 40 MG tablet Take 20-40 mg by mouth daily.    Continuous Blood Gluc Receiver (FREESTYLE LIBRE 2 READER) DEVI USE WITH SENSORS TO MONITOR BLOOD SUGAR.   Continuous Blood Gluc Sensor (FREESTYLE LIBRE 2 SENSOR) MISC APPLY ONE SENSOR EVERY 14 DAYS AS DIRECTED   ELIQUIS 5 MG TABS tablet TAKE ONE TABLET BY MOUTH TWICE DAILY   ENTRESTO 24-26 MG TAKE ONE TABLET BY MOUTH TWICE DAILY   furosemide (LASIX) 40 MG tablet TAKE TWO (2) TABLETS BY MOUTH THREE TIMES WEEKLY AND ONE TABLET FOUR TIMES WEEKLY.   gabapentin (NEURONTIN) 400 MG capsule Take 400 mg by mouth 3 (three) times daily.    IBU 800 MG tablet Take 800 mg by mouth every 6 (six) hours as needed.   Insulin Pen Needle (B-D ULTRAFINE III SHORT PEN) 31G X 8 MM MISC Use to inject insulin 4 x daily   ipratropium-albuterol (DUONEB) 0.5-2.5 (3) MG/3ML SOLN Take 3 mLs by nebulization every 4 (four) hours as needed (for shortness of breath).   Lancets (FREESTYLE) lancets USE TO CHECK BLOOD SUGAR FOUR TIMES DAILY.   magnesium oxide (MAG-OX) 400 (240 Mg) MG tablet Take 1 tablet by mouth daily.   metoCLOPramide (REGLAN) 5 MG tablet Take 5 mg by mouth every 6 (six) hours as needed for nausea or vomiting.   nitroGLYCERIN (NITROSTAT) 0.4 MG SL tablet DISSOLVE ONE TABLET UNDER TONGUE EVERY 5 MINUTES UP TO 3 DOSES AS NEEDED FOR CHEST PAIN (IF NO RELIEF AFTER 2ND DOSE GO TO ED OR CALL 911)   NON FORMULARY 1 each by Other route See admin  instructions. CPAP - Uses nightly   NOVOLOG FLEXPEN 100 UNIT/ML FlexPen Inject 20-26 Units into the skin 3 (three) times daily with meals.   simvastatin (ZOCOR) 40 MG tablet TAKE ONE TABLET BY MOUTH AT BEDTIME   traMADol (ULTRAM) 50 MG tablet Take 1 tablet by mouth daily as needed (pain).    TRELEGY ELLIPTA 100-62.5-25 MCG/INH AEPB Inhale 1 puff into the lungs daily.   triamcinolone cream (KENALOG) 0.1 % Apply 1 application topically 2 (two) times daily as  needed.   vitamin B-12 (CYANOCOBALAMIN) 1000 MCG tablet Take 1,000 mcg by mouth 2 (two) times daily.   VOLTAREN 1 % GEL Apply 2 g topically 4 (four) times daily as needed (for pain).    [DISCONTINUED] insulin glargine (LANTUS SOLOSTAR) 100 UNIT/ML Solostar Pen Inject 70 Units into the skin at bedtime.   insulin glargine (LANTUS SOLOSTAR) 100 UNIT/ML Solostar Pen Inject 70 Units into the skin at bedtime.   No facility-administered encounter medications on file as of 05/04/2022.    ALLERGIES: No Known Allergies  VACCINATION STATUS:  There is no immunization history on file for this patient.  Diabetes He presents for his follow-up diabetic visit. He has type 2 diabetes mellitus. Onset time: He was diagnosed at approximate age of 22 years. His disease course has been stable. There are no hypoglycemic associated symptoms. Pertinent negatives for hypoglycemia include no confusion, hunger, pallor or seizures. Associated symptoms include foot paresthesias. Pertinent negatives for diabetes include no fatigue, no polydipsia, no polyphagia, no polyuria, no weakness and no weight loss (intentional). There are no hypoglycemic complications. Symptoms are stable. Diabetic complications include heart disease, nephropathy and peripheral neuropathy. Risk factors for coronary artery disease include dyslipidemia, diabetes mellitus, hypertension, male sex, sedentary lifestyle, tobacco exposure, family history and obesity. Current diabetic treatment includes  intensive insulin program. He is compliant with treatment most of the time. His weight is stable. He is following a generally unhealthy diet. When asked about meal planning, he reported none. He has not had a previous visit with a dietitian. He never [facility] with cane due to bilateral knee arthritis) participates in exercise. His home blood glucose trend is fluctuating dramatically. (He presents today, accompanied by his wife, with his CGM and logs showing fluctuating glycemic profile.  His POCT A1c today is 8.1%, essentially unchanged from previous visit.  He does note he has been missing his night time medications as he falls asleep shortly after supper.  Analysis of his CGM shows TIR 27%, TAR 73%, TBR 0% with a GMI of 8.5%.) An ACE inhibitor/angiotensin II receptor blocker is being taken. He sees a podiatrist.Eye exam is current.  Hyperlipidemia This is a chronic problem. The current episode started more than 1 year ago. The problem is controlled. Recent lipid tests were reviewed and are normal. Exacerbating diseases include chronic renal disease, diabetes and obesity. Factors aggravating his hyperlipidemia include smoking, fatty foods and beta blockers. Pertinent negatives include no myalgias or shortness of breath. Current antihyperlipidemic treatment includes statins. The current treatment provides mild improvement of lipids. Compliance problems include adherence to diet and adherence to exercise.  Risk factors for coronary artery disease include dyslipidemia, diabetes mellitus, hypertension, a sedentary lifestyle, male sex and obesity.  Hypertension This is a chronic problem. The current episode started more than 1 year ago. The problem has been resolved since onset. The problem is controlled. Pertinent negatives include no shortness of breath. There are no associated agents to hypertension. Risk factors for coronary artery disease include diabetes mellitus, dyslipidemia, family history, male gender,  obesity, sedentary lifestyle and smoking/tobacco exposure. Past treatments include beta blockers and diuretics. The current treatment provides significant improvement. Compliance problems include diet and exercise.  Hypertensive end-organ damage includes kidney disease and CAD/MI. Identifiable causes of hypertension include chronic renal disease.    Review of systems  Constitutional: + stable body weight,  current  Body mass index is 35.56 kg/m. , + fatigue, no subjective hyperthermia, no subjective hypothermia Eyes: no blurry vision, no xerophthalmia ENT: no  sore throat, no nodules palpated in throat, no dysphagia/odynophagia, no hoarseness Cardiovascular: no Chest Pain, no Shortness of Breath, no palpitations, no leg swelling Respiratory: chronic cough (smoker), intermittent shortness of breath Gastrointestinal: no Nausea/Vomiting/Diarrhea Musculoskeletal: bilateral knee pain (under evaluation for bilateral TKR)  Skin: no rashes, no hyperemia, multiple nonhealing wounds to BLE Neurological: no tremors, no numbness, no tingling, + intermittent dizziness Psychiatric: no depression, no anxiety   Objective:    BP 124/68 (BP Location: Right Arm, Patient Position: Sitting, Cuff Size: Large)   Pulse 89   Ht '5\' 9"'$  (1.753 m)   Wt 240 lb 12.8 oz (109.2 kg)   BMI 35.56 kg/m   Wt Readings from Last 3 Encounters:  05/04/22 240 lb 12.8 oz (109.2 kg)  03/10/22 235 lb 9.6 oz (106.9 kg)  02/06/22 242 lb (109.8 kg)    BP Readings from Last 3 Encounters:  05/04/22 124/68  03/10/22 (!) 152/67  02/06/22 112/62    Physical Exam- Limited  Constitutional:  Body mass index is 35.56 kg/m. , not in acute distress, normal state of mind Eyes:  EOMI, no exophthalmos Neck: Supple Cardiovascular: RRR, no murmurs, rubs, or gallops, + edema BLE R>L Respiratory: SOB at rest with chronic congested cough, no crackles, rales, rhonchi, or wheezing Musculoskeletal: no gross deformities, strength intact in all  four extremities Skin:  no rashes, no hyperemia Neurological: no tremor with outstretched hands   Diabetic Foot Exam - Simple   Simple Foot Form Visual Inspection No deformities, no ulcerations, no other skin breakdown bilaterally: Yes Sensation Testing Intact to touch and monofilament testing bilaterally: Yes See comments: Yes Pulse Check Posterior Tibialis and Dorsalis pulse intact bilaterally: Yes Comments Decreased sensation to monofilament tool bilaterally      Lipid Panel     Component Value Date/Time   CHOL 101 12/03/2021 0000   TRIG 113 12/03/2021 0000   HDL 32 (A) 12/03/2021 0000   LDLCALC 46 12/03/2021 0000   Recent Results (from the past 2160 hour(s))  JAK2 V617F rfx CALR/MPL/E12-15     Status: None   Collection Time: 02/05/22  2:08 PM  Result Value Ref Range   JAK2 V617F Result Comment     Comment: (NOTE) NEGATIVE The JAK2 V617F mutation is not detected in the provided specimen of this individual. Results should be interpreted in conjunction with clinical and other laboratory findings for the most accurate interpretation. This test was developed and its performance characteristics determined by [facility]. It has not been cleared or approved by the Food and Drug Administration.    Reflex Comment     Comment: (NOTE) Reflex to CALR Mutation Analysis, JAK2 Exon 12-15 Mutation Analysis, and MPL Mutation Analysis is indicated.    V617F Rfx CALR/MPL/E12-15 Bkgd Comment     Comment: (NOTE) Molecular testing of blood or bone marrow is useful in the evaluation of suspected myeloproliferative neoplasms (MPN). Mutations in the JAK2, MPL, and CALR genes are present in virtually all MPNs and their presence help distinguish benign reactive processes from clonal neoplasms. These mutations are generally considered mutually exclusive, although concurrent clones have been reported in rare patients. This test will assess for the JAK2V617F (exon 14) mutation first and  will reflex to CALR mutation analysis, MPL mutation analysis, and JAK2 exon 12 to 15 mutation analysis if the JAK2V617F mutation is negative. The JAK2 (Janus kinase 2) gene encodes for a non-receptor protein tyrosine kinase that activates cytokine and growth factor signaling. The V617F (c.1849 G>T) mutation results in constitutive activation of  JAK2 and downstream STAT5 and ERK signaling. The V617F mutation is observed in approximately 95% of polycythemia vera (PV), 60% of essential thrombocythemi a (ET) and primary myelofibrosis (PMF). It is also infrequently present (3-5%) in myelodysplastic syndrome, chronic myelomonocytic leukemia, and other atypical chronic myeloid disorders. A small percentage of JAK2 mutation positive patients (3.3%) contain other non-V617F mutations within exons 12 to 15. In particular, mutations in exon 12 of JAK2 have been described in approximately 3% of patients with PV. JAK2 allele burden correlates with clinical phenotype, with low levels of mutant allele characterized by thrombocytosis, intermediate levels with erythrocytosis, and high mutant allele burden correlating with enhanced myelopoiesis of the BM, leukocytosis, increasing spleen size, and circulating CD34-positive cells. The CALR (Calreticulin) gene encodes for a multifunctional calcium-binding protein involved in many cellular activities such as growth, proliferation, adhesion, and programmed cell death. Among patients with JAK2 negative MPNs, CALR are found in approximat ely 70% of patients with JAK2-negative essential thrombocythemia (ET) and 60-88% of patients with JAK2-negative primary myelofibrosis(PMF). Only a minority of patients (approximately 8%) with myelodysplasia have mutations in the CALR gene. CALR mutations are rarely detected in patients with de novo acute myeloid leukemia, chronic myelogenous leukemia, lymphoid leukemia, or solid tumors. CALR mutations are not detected  in polycythemia and generally appear to be mutually exclusive with JAK2 mutations and MPL mutations. The majority of mutational changes involve a variety of insertion deletion mutations in exon 9 of the calreticulin gene: approximately 53% of all CALR mutations are a 52 bp deletion (type-1) while the second most prevalent mutation (approximately 32%) contains a 5 bp insertion (type-2). Other mutations (non-type 1 or type 2) are seen in a small minority of cases. CALR mutations in PMF tend to be with a favorable prognosis compared to JAK2 V617F mutations, w hereas primary myelofibrosis negative for CALR, JAK2 V617F and MPL mutations (so-called triple negative) is associated with a poor prognosis and shorter survival. The MPL (myeloproliferative leukemia virus oncogene) gene encodes the thrombopoietin receptor which regulates hematopoiesis and megakaryopoiesis. Activating MPL mutations are associated with a subset of myeloproliferative neoplasms and acute megakaryoblastic leukemia. MPL W515 mutations are present in approximately 5-8% of patients with primary myelofibrosis (PMF) and 1-4% of patients with essential thrombocythemia (ET). The S505 mutation is detected in patients with hereditary thrombocythemia. Limitations This assay has a sensitivity of approximately 1% VAF for JAK2 V617F, 2.5% VAF for other mutations in JAK2 exons 12 to 15, CALR mutations, and MPL mutations.    Method based next generation sequencing.     Comment: Comment [employer]    References Comment     Comment: (NOTE) Alghasham N, Alnouri Y, Abalkhail H, Elenor Legato. Detection of mutations in JAK2 exons 12-15 by [employer] sequencing. Int J Lab Hematol. 2016 Feb;38(1):34-41. doi: 10.1111/ijlh.48889. Epub 2015 Sep 11. PMID: 16945038. Octavia Heir, Oneida Arenas, Hasserjian R, Linward Foster, Borowitz MJ, Zada Finders MM, Newell CD, [place], Vardiman JW. The 2016 revision to the World Health Organization classification of myeloid  neoplasms and acute leukemia. Blood. 2016 May 19;127(20):2391-405. doi: 10.1182/blood-2016-03-643544. Epub 2016 Apr 11. PMID: 88280034. Blima Ledger [facility], Zhang ZJ, Arkoma S, Albitar M. Mutation profile of JAK2 transcripts in patients with chronic myeloproliferative neoplasias. J Mol Diagn. 2009 Jan;11(1):49-53.doi: 10.2353/jmoldx.2009.080114. Epub 2008 Dec 12. PMID: 91791505; PMCID: WPV9480165. NCCN Clinical Practice Guidelines in Oncology (NCCN Guidelines) Myeloproliferative Neoplasms Version 3.2022 - February 13, 2021. Swerdlow SH, English as a second language teacher. WHO classif ication of Tumours of Haematopoietic and Lymphoid Tissues. 4th edn. 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 Tefferi A. Primary myelofibrosis: 2021 update on diagnosis, risk-stratification and management. Am J Hematol. 2021 Jan;96(1):145-162. doi: 10.1002/ajh.26050. Epub 2020 Dec 2. PMID: 16109604. Grier Mitts, Kralovics R. Genetic basis and molecular pathophysiology of classical myeloproliferative neoplasms. Blood. 2017 Feb 9;129(6):667-679. doi: 10.1182/blood-2016-10-695940. Epub 2016 Dec 27. PMID: 54098119.    Director Review Comment     Comment: (NOTE) Loni Muse, PhD, [facility]    Director, [place], [place] [address]    [phone] Performed At: [place], [place] [id number] Katina Degree MDPhD WU:[id number] Performed At: [facility] [address] [place], [place] [id number] Katina Degree MDPhD [id number]:[id number]   Erythropoietin     Status: None   Collection Time: 02/05/22  2:08 PM  Result Value Ref Range   Erythropoietin 10.5 2.6 - 18.5 mIU/mL    Comment: (NOTE) Beckman Coulter [employer] DxI [place] obtained with different assay methods or kits cannot be used interchangeably. Results cannot be interpreted as absolute evidence of the presence or  absence of malignant disease. Performed At: [facility] [place], [place] [id number] Rush Farmer MD [id number]   Kappa/lambda light chains     Status: Abnormal   Collection Time: 02/05/22  2:08 PM  Result Value Ref Range   Kappa free light chain 49.1 (H) 3.3 - 19.4 mg/L   Lambda free light chains 27.5 (H) 5.7 - 26.3 mg/L   Kappa, lambda light chain ratio 1.79 (H) 0.26 - 1.65    Comment: (NOTE) Performed At: [facility] [place] [address] [place], [place] [id number] Rush Farmer MD [id number]   Beta 2 microglobulin, serum     Status: Abnormal   Collection Time: 02/05/22  2:08 PM  Result Value Ref Range   Beta-2 Microglobulin 3.8 (H) 0.6 - 2.4 mg/L    Comment: (NOTE) Siemens Immulite 2000 Immunochemiluminometric assay (ICMA) Values obtained with different assay methods or kits cannot be used interchangeably. Results cannot be interpreted as absolute evidence of the presence or absence of malignant disease. Performed At: [facility] [place], [place] [id number] Rush Farmer MD VO:[id number]   Lactate dehydrogenase     Status: None   Collection Time: 02/05/22  2:08 PM  Result Value Ref Range   LDH 145 98 - 192 U/L    Comment: Performed at [facility], [address], [place], [place] [address]  Comprehensive metabolic panel     Status: Abnormal   Collection Time: 02/05/22  2:08 PM  Result Value Ref Range   Sodium 137 135 - 145 mmol/L   Potassium 4.5 3.5 - 5.1 mmol/L   Chloride 102 98 - 111 mmol/L   CO2 27 22 - 32 mmol/L   Glucose, Bld 249 (H) 70 - 99 mg/dL    Comment: Glucose reference range applies only to samples taken after fasting for at least 8 hours.   BUN 17 8 - 23 mg/dL   Creatinine, Ser 1.75 (H) 0.61 - 1.24 mg/dL   Calcium 8.9 8.9 - 10.3 mg/dL   Total Protein 6.8 6.5 - 8.1 g/dL   Albumin 3.6 3.5 - 5.0 g/dL   AST 16 15 - 41 U/L   ALT 14 0 - 44 U/L   Alkaline Phosphatase 63 38 - 126 U/L   Total  Bilirubin 1.2 0.3 - 1.2 mg/dL   GFR, Estimated  40 (L) >60 mL/min    Comment: (NOTE) Calculated using the CKD-EPI Creatinine Equation (2021)    Anion gap 8 5 - 15    Comment: Performed at [facility], [address], [place], [place] [address]  CBC with Differential/Platelet     Status: Abnormal   Collection Time: 02/05/22  2:08 PM  Result Value Ref Range   WBC 9.9 4.0 - 10.5 K/uL   RBC 5.84 (H) 4.22 - 5.81 MIL/uL   Hemoglobin 16.6 13.0 - 17.0 g/dL   HCT 49.5 39.0 - 52.0 %   MCV 84.8 80.0 - 100.0 fL   MCH 28.4 26.0 - 34.0 pg   MCHC 33.5 30.0 - 36.0 g/dL   RDW 13.9 11.5 - 15.5 %   Platelets 141 (L) 150 - 400 K/uL   nRBC 0.0 0.0 - 0.2 %   Neutrophils Relative % 52 %   Neutro Abs 5.3 1.7 - 7.7 K/uL   Lymphocytes Relative 36 %   Lymphs Abs 3.5 0.7 - 4.0 K/uL   Monocytes Relative 9 %   Monocytes Absolute 0.9 0.1 - 1.0 K/uL   Eosinophils Relative 2 %   Eosinophils Absolute 0.2 0.0 - 0.5 K/uL   Basophils Relative 1 %   Basophils Absolute 0.1 0.0 - 0.1 K/uL   Immature Granulocytes 0 %   Abs Immature Granulocytes 0.02 0.00 - 0.07 K/uL    Comment: Performed at [facility], [address], [place], [place] [address]  Immunofixation electrophoresis     Status: Abnormal   Collection Time: 02/05/22  2:08 PM  Result Value Ref Range   Total Protein ELP 6.0 6.0 - 8.5 g/dL   IgG (Immunoglobin G), Serum 811 603 - 1,613 mg/dL   IgA 234 61 - 437 mg/dL   IgM (Immunoglobulin M), Srm 152 (H) 15 - 143 mg/dL    Comment: (NOTE) Performed At: [facility] [place], [place] [id number] Rush Farmer MD LX:[id number]    Immunofixation Result, Serum Comment     Comment: (NOTE) The immunofixation pattern appears unremarkable. Evidence of monoclonal protein is not apparent.   Protein electrophoresis, serum     Status: None   Collection Time: 02/05/22  2:08 PM  Result Value Ref Range   Total Protein ELP 6.0 6.0 - 8.5 g/dL   Albumin ELP 3.2 2.9 - 4.4 g/dL   Alpha-1-Globulin  0.2 0.0 - 0.4 g/dL   Alpha-2-Globulin 0.7 0.4 - 1.0 g/dL   Beta Globulin 0.9 0.7 - 1.3 g/dL   Gamma Globulin 0.9 0.4 - 1.8 g/dL   M-Spike, % Not Observed Not Observed g/dL   SPE Interp. Comment     Comment: (NOTE) The SPE pattern appears unremarkable. Evidence of monoclonal protein is not apparent. Performed At: [facility] [place], [place] [id number] Rush Farmer MD IW:[id number]    Comment Comment     Comment: (NOTE) Protein electrophoresis scan will follow via computer, mail, or courier delivery.    Globulin, Total 2.8 2.2 - 3.9 g/dL   A/G Ratio 1.1 0.7 - 1.7  CALR +MPL + E12-E15 (reflexed)     Status: None   Collection Time: 02/05/22  2:08 PM  Result Value Ref Range   CALR Result Comment     Comment: (NOTE) NEGATIVE No insertions or deletions were detected within the analyzed region of the calreticulin (CALR) gene. A negative result does not entirely exclude the possibility of a clonal population carrying CALR gene mutations that are not covered by this  assay. Results should be interpreted in conjunction with clinical and laboratory findings for the most accurate interpretation.    MPL Result Comment     Comment: (NOTE) NEGATIVE No MPL mutation was identified in the provided specimen of this individual. Results should be interpreted in conjunction with clinical and other laboratory findings for the most accurate interpretation.    E12-15 Result Comment     Comment: (NOTE) NEGATIVE JAK2 mutations were not detected in exons 12, 13, 14 and 15. This result does not rule out the presence of JAK2 mutation at a level below the detection sensitivity of this assay, the presence of other mutations outside the analyzed region of the JAK2 gene, or the presence of a myeloproliferative or other neoplasm. Result must be correlated with other clinical data for the most accurate diagnosis. Performed At: [facility] [address] [place]  [place], [place] [id number] Katina Degree MDPhD ZD:[id number]   [facility] [place]     Status: None   Collection Time: 02/06/22 11:05 AM  Result Value Ref Range   Date Time Interrogation Session [id number]    Pulse Generator Manufacturer MERM    Pulse Gen Model A2DR01 Advisa DR MRI    Pulse Gen Serial Number [id number]    Clinic Name [facility]    Implantable Pulse Generator Type Implantable Pulse Generator    Implantable Pulse Generator Implant Date [id number]    Implantable Lead Manufacturer MERM    Implantable Lead Model 5076 CapSureFix Novus    Implantable Lead Serial Number [id number]    Implantable Lead Implant Date [id number]    Implantable Lead Location Detail 1 UNKNOWN    Implantable Lead Location [id number]    Implantable Lead Manufacturer MERM    Implantable Lead Model 5076 CapSureFix Novus    Implantable Lead Serial Number [id number]    Implantable Lead Implant Date [id number]    Implantable Lead Location Detail 1 UNKNOWN    Implantable Lead Location [id number]    Lead Channel Setting Sensing Sensitivity 2.8 mV   Lead Channel Setting Pacing Pulse Width 0.4 ms   Lead Channel Setting Pacing Amplitude 2.5 V   Lead Channel Impedance Value 494 ohm   Lead Channel Impedance Value 361 ohm   Lead Channel Sensing Intrinsic Amplitude 3.25 mV   Lead Channel Sensing Intrinsic Amplitude 1.625 mV   Lead Channel Pacing Threshold Amplitude 0.5 V   Lead Channel Pacing Threshold Pulse Width 0.4 ms   Lead Channel Impedance Value 437 ohm   Lead Channel Impedance Value 342 ohm   Lead Channel Sensing Intrinsic Amplitude 20 mV   Lead Channel Sensing Intrinsic Amplitude 20 mV   Lead Channel Pacing Threshold Amplitude 0.875 V   Lead Channel Pacing Threshold Pulse Width 0.4 ms   Battery Status OK    Battery Remaining Longevity 9 mo   Battery Voltage 2.88 V   Brady Statistic RA Percent Paced 0 %   Brady Statistic RV Percent Paced 90.07 %   Eval Rhythm AF/VP 30   CUP PACEART REMOTE  DEVICE CHECK     Status: None   Collection Time: 04/10/22  4:44 PM  Result Value Ref Range   Date Time Interrogation Session [id number]    Pulse Generator Manufacturer MERM    Pulse Gen Model A2DR01 Advisa DR MRI    Pulse Gen Serial Number [id number]    Clinic Name [facility]    Implantable Pulse Generator Type Implantable Pulse Generator    Implantable Pulse Generator Implant Date  [id number]    Implantable Lead Manufacturer [facility]    Implantable Lead Model 5076 CapSureFix Novus    Implantable Lead Serial Number [id number]    Implantable Lead Implant Date 02774128    Implantable Lead Location Detail 1 UNKNOWN    Implantable Lead Location [phone]    Implantable Lead Manufacturer [facility]    Implantable Lead Model 5076 CapSureFix Novus    Implantable Lead Serial Number [id number]    Implantable Lead Implant Date [id number]    Implantable Lead Location Detail 1 UNKNOWN    Implantable Lead Location [phone]    Lead Channel Setting Sensing Sensitivity 2.8 mV   Lead Channel Setting Pacing Pulse Width 0.4 ms   Lead Channel Setting Pacing Amplitude 2.5 V   Lead Channel Impedance Value 513 ohm   Lead Channel Impedance Value 380 ohm   Lead Channel Sensing Intrinsic Amplitude 5.75 mV   Lead Channel Sensing Intrinsic Amplitude 5.75 mV   Lead Channel Pacing Threshold Amplitude 0.5 V   Lead Channel Pacing Threshold Pulse Width 0.4 ms   Lead Channel Impedance Value 456 ohm   Lead Channel Impedance Value 361 ohm   Lead Channel Sensing Intrinsic Amplitude 24 mV   Lead Channel Sensing Intrinsic Amplitude 24 mV   Lead Channel Pacing Threshold Amplitude 0.875 V   Lead Channel Pacing Threshold Pulse Width 0.4 ms   Battery Status OK    Battery Remaining Longevity 8 mo   Battery Voltage 2.87 V   Brady Statistic RA Percent Paced 0 %   Brady Statistic RV Percent Paced 95.28 %  HgB A1c     Status: Abnormal   Collection Time: 05/04/22  2:03 PM  Result Value Ref Range   Hemoglobin A1C 8.1 (A) 4.0 - 5.6 %    HbA1c POC (<> result, manual entry)     HbA1c, POC (prediabetic range)     HbA1c, POC (controlled diabetic range)        Latest Ref Rng & Units 02/05/2022    2:08 PM 12/03/2021   12:00 AM 06/25/2020   12:00 AM  CMP  Glucose 70 - 99 mg/dL 249     BUN 8 - 23 mg/dL '17  13  25      '$ Creatinine 0.61 - 1.24 mg/dL 1.75  1.7  1.6      Sodium 135 - 145 mmol/L 137  137    Potassium 3.5 - 5.1 mmol/L 4.5  4.7    Chloride 98 - 111 mmol/L 102  102    CO2 22 - 32 mmol/L 27  28    Calcium 8.9 - 10.3 mg/dL 8.9  8.8  9.2      Total Protein 6.5 - 8.1 g/dL 6.8     Total Bilirubin 0.3 - 1.2 mg/dL 1.2     Alkaline Phos 38 - 126 U/L 63  74    AST 15 - 41 U/L 16  18    ALT 0 - 44 U/L 14  20       This result is from an external source.     Assessment & Plan:   1) Type 2 diabetes mellitus with stage 3 chronic kidney disease, without long-term current use of insulin ([place])  - Patient has currently uncontrolled symptomatic type 2 DM since 76 years of age.  He presents today, accompanied by his wife, with his CGM and logs showing fluctuating glycemic profile.  His POCT A1c today is 8.1%, essentially unchanged from previous visit.  He does note he has  been missing his night time medications as he falls asleep shortly after supper.  Analysis of his CGM shows TIR 27%, TAR 73%, TBR 0% with a GMI of 8.5%.  -his diabetes is complicated by stage 3 renal insufficiency (improving), obesity/sedentary life, neuropathy, cardiomyopathy, chronic heavy smoking, inadequate insurance and [place] [place] remains at a high risk for more acute and chronic complications which include CAD, CVA, CKD, retinopathy, and neuropathy. These are all discussed in detail with the patient.  - Nutritional counseling repeated at each appointment due to patients tendency to fall back in to old habits.  - The patient admits there is a room for improvement in their diet and drink choices. -  Suggestion is made for the patient to avoid simple  carbohydrates from their diet including Cakes, Sweet Desserts / Pastries, Ice Cream, Soda (diet and regular), Sweet Tea, Candies, Chips, Cookies, Sweet Pastries, Store Bought Juices, Alcohol in Excess of 1-2 drinks a day, Artificial Sweeteners, Coffee Creamer, and "Sugar-free" Products. This will help patient to have stable blood glucose profile and potentially avoid unintended weight gain.   - I encouraged the patient to switch to unprocessed or minimally processed complex starch and increased protein intake (animal or plant source), fruits, and vegetables.   - Patient is advised to stick to a routine mealtimes to eat 3 meals a day and avoid unnecessary snacks (to snack only to correct hypoglycemia).  - I have approached him with the following individualized plan to manage diabetes and patient agrees:   - Based on his glucose profile, he will continue to need intensive treatment with higher dose of basal/bolus insulin in order for him to achieve control of diabetes to target.    -He is advised to continue his Lantus at 70 units but to start taking it at supper so he can remember it consistently and continue his Novolog 20-26 units TID with meals if glucose is above 90 and he is eating (Specific instructions on how to titrate insulin dosage based on glucose readings given to patient in writing).   -He is encouraged to continue using his CGM to monitor blood glucose 4 times per day, before meals and at bedtime and report to the clinic if blood glucose levels are less than 70 or greater than 200 for 3 tests in a row.  - He is not a candidate for metformin therapy due to CKD.  - He is not a candidate for incretin therapy (such as Ozempic) due to increased risk of pancreatitis r/t hypertriglyceridemia and heavy smoking history.  2) Lipids/HPL:  His recent lipid panel from 12/03/21 shows controlled LDL of 46.  He is advised to continue Cholestyramine 4 g TID with meals and Simvastatin 40 mg po daily at  bedtime.  Side effects and precautions discussed with him.    3) Hypertension His blood pressure is controlled to target for his age.  He is advised to continue Coreg 6.25 mg po twice daily, Lasix 40 mg po every other day (alternating with 20 mg), and Entresto 24-26 mg po twice daily.   4) Chronic Care/Health Maintenance: -he is on ACEI/ARB and Statin medications and  is encouraged to continue to follow up with Ophthalmology, Dentist,  Podiatrist at least yearly or according to recommendations. - I have recommended yearly flu vaccine and pneumonia vaccination at least every 5 years; and  sleep for at least 7 hours a day.  The patient was counseled on the dangers of tobacco use, and was advised to quit.  Reviewed strategies to maximize success, including removing cigarettes and smoking materials from environment.  5) Weight management:  His Body mass index is 35.56 kg/m. He is a candidate for modest weight loss.  Exercise regimen and carbs restrictions were detailed with him.   - I advised patient to maintain close follow up with Glenda Chroman, MD for primary care needs.      I spent 44 minutes in the care of the patient today including review of labs from [place], Lipids, Thyroid Function, Hematology (current and previous including abstractions from other facilities); face-to-face time discussing  his blood glucose readings/logs, discussing hypoglycemia and hyperglycemia episodes and symptoms, medications doses, his options of short and long term treatment based on the latest standards of care / guidelines;  discussion about incorporating lifestyle medicine;  and documenting the encounter. Risk reduction counseling performed per USPSTF guidelines to reduce obesity and cardiovascular risk factors.     Please refer to Patient Instructions for Blood Glucose Monitoring and Insulin/Medications Dosing Guide"  in media tab for additional information. Please  also refer to " Patient Self Inventory" in the  Media  tab for reviewed elements of pertinent patient history.  Dylan Tyler participated in the discussions, expressed understanding, and voiced agreement with the above plans.  All questions were answered to his satisfaction. he is encouraged to contact clinic should he have any questions or concerns prior to his return visit.   Follow up plan: - Return in about 3 months (around 08/04/2022) for Diabetes F/U with A1c in office, No previsit labs, Bring meter and logs.    Rayetta Pigg, [facility] [address] [place], [place] [address] Phone: [phone] Fax: [phone]   05/04/2022, 2:31 PM

## 2022-05-11 ENCOUNTER — Ambulatory Visit (INDEPENDENT_AMBULATORY_CARE_PROVIDER_SITE_OTHER): Payer: PPO

## 2022-05-11 DIAGNOSIS — I442 Atrioventricular block, complete: Secondary | ICD-10-CM

## 2022-05-13 ENCOUNTER — Telehealth: Payer: Self-pay

## 2022-05-13 LAB — CUP PACEART REMOTE DEVICE CHECK
Battery Remaining Longevity: 9 mo
Battery Voltage: 2.88 V
Brady Statistic AP VP Percent: 0 %
Brady Statistic AP VS Percent: 0 %
Brady Statistic AS VP Percent: 94.83 %
Brady Statistic AS VS Percent: 5.17 %
Brady Statistic RA Percent Paced: 0 %
Brady Statistic RV Percent Paced: 95.72 %
Date Time Interrogation Session: 20231107092939
Implantable Lead Connection Status: 753985
Implantable Lead Connection Status: 753985
Implantable Lead Implant Date: 20161115
Implantable Lead Implant Date: 20161115
Implantable Lead Location: 753859
Implantable Lead Location: 753860
Implantable Lead Model: 5076
Implantable Lead Model: 5076
Implantable Pulse Generator Implant Date: 20161115
Lead Channel Impedance Value: 342 Ohm
Lead Channel Impedance Value: 361 Ohm
Lead Channel Impedance Value: 437 Ohm
Lead Channel Impedance Value: 494 Ohm
Lead Channel Pacing Threshold Amplitude: 0.5 V
Lead Channel Pacing Threshold Amplitude: 0.875 V
Lead Channel Pacing Threshold Pulse Width: 0.4 ms
Lead Channel Pacing Threshold Pulse Width: 0.4 ms
Lead Channel Sensing Intrinsic Amplitude: 19.5 mV
Lead Channel Sensing Intrinsic Amplitude: 19.5 mV
Lead Channel Sensing Intrinsic Amplitude: 2.625 mV
Lead Channel Sensing Intrinsic Amplitude: 2.625 mV
Lead Channel Setting Pacing Amplitude: 2.5 V
Lead Channel Setting Pacing Pulse Width: 0.4 ms
Lead Channel Setting Sensing Sensitivity: 2.8 mV
Zone Setting Status: 755011
Zone Setting Status: 755011

## 2022-05-13 NOTE — Telephone Encounter (Signed)
Received  the following transmission from [employer]:  Scheduled remote reviewed. Normal device function.   1 monitored VT, 52sec in duration, mean ventricular rate 147.  Event occurred 11/3 @ 19:45 - route to triage per protocol Known AF, controlled rates.  Burden 35.2%,  OAC- Eliquis Battery estimated 45moNext remote 12/7 LA  Patient has dual chamber MDT PPM.  Of note patient has 52 secs of recorded VT (episode below) max V rate 162, mean rate 147 occurred at 7pm on 11/3 otherwise 4 brief 1 sec or less NSVT episodes. Called patient.  He reports no changes to diet, health or medications just that he continues with ongoing symptom of fatigue (nothing new or suddenly worse).    Of note, his AF burden has decreased in recent months from 100% in August to 35.2% on today's transmission.   Will forward to Dr. MMyles Gip  [facility], he is on the list to change over from Dr. ARayann Heman

## 2022-05-18 ENCOUNTER — Ambulatory Visit: Payer: PPO | Attending: Cardiology | Admitting: Cardiology

## 2022-05-18 ENCOUNTER — Encounter: Payer: Self-pay | Admitting: Cardiology

## 2022-05-18 VITALS — BP 116/64 | HR 74 | Ht 69.0 in | Wt 244.4 lb

## 2022-05-18 DIAGNOSIS — I442 Atrioventricular block, complete: Secondary | ICD-10-CM

## 2022-05-18 DIAGNOSIS — Z8679 Personal history of other diseases of the circulatory system: Secondary | ICD-10-CM

## 2022-05-18 DIAGNOSIS — N1832 Chronic kidney disease, stage 3b: Secondary | ICD-10-CM | POA: Diagnosis not present

## 2022-05-18 DIAGNOSIS — I4819 Other persistent atrial fibrillation: Secondary | ICD-10-CM | POA: Diagnosis not present

## 2022-05-18 NOTE — Progress Notes (Signed)
Cardiology Office Note  Date: 05/18/2022   ID: Tyler, Dylan Sep 23, 1945, MRN [id number]  PCP:  Dylan Chroman, MD  Cardiologist:  Dylan Lesches, MD Electrophysiologist:  Dylan Grayer, MD   Chief Complaint  Patient presents with   Cardiac follow-up    History of Present Illness: Dylan Tyler is a 76 y.o. male last seen in May.  He is here today with his wife for a follow-up visit.  Reports chronic fatigue, no angina or palpitations however.  He has had no sudden dizziness or syncope.  Intermittent leg swelling has been reasonably controlled on current diuretic regimen.  Medtronic pacemaker in place, previously followed by Dr. [name].  Recent device interrogation showed approximately 35% AF burden, also episode of VT, otherwise normal device function.  I reviewed his medications which are noted below.  He reports no spontaneous bleeding problems on Eliquis.  He remains on Coreg, Entresto, and Lasix.  LVEF was 50 to 55% by echocardiogram in June.  I personally reviewed his ECG today which shows a ventricular paced rhythm.  Past Medical History:  Diagnosis Date   Ankylosing spondylitis (HCC)    Anxiety    Aortic root dilatation (HCC)    AR (aortic regurgitation)    Atrial fibrillation and flutter (HCC)    Bladder cancer (HCC)    Chronic pain    Follow[facility] at [facility]   COPD (chronic obstructive pulmonary disease) (HCC)    DDD (degenerative disc disease), lumbosacral    DDD (degenerative disc disease), thoracolumbar    Degenerative cervical disc    Depression    Diabetes mellitus type II    Diverticulitis    Essential hypertension    GERD (gastroesophageal reflux disease)    History of kidney stones    Hyperlipidemia    Migraine    Nonischemic cardiomyopathy ([place])    Cardiolite 12/09 LVEF 55%, minor luminal irregularities at cardiac catheterization   OSA on CPAP    Rheumatoid arthritis (HCC)    Symptomatic bradycardia    Medtronic Advisa L dual-chamber pacemaker   05/2015 - Dr. Curt Bears    Past Surgical History:  Procedure Laterality Date   CARDIAC CATHETERIZATION  08/13/11   CARDIOVERSION N/A 07/02/2015   Procedure: CARDIOVERSION;  Surgeon: Sanda Klein, MD;  Location: [facility];  Service: Cardiovascular;  Laterality: N/A;   CATARACT EXTRACTION W/PHACO Left 02/10/2019   Procedure: CATARACT EXTRACTION PHACO AND INTRAOCULAR LENS PLACEMENT ([place]);  Surgeon: Baruch Goldmann, MD;  Location: [facility];  Service: Ophthalmology;  Laterality: Left;  CDE: 7.10   CATARACT EXTRACTION W/PHACO Right 02/27/2019   Procedure: CATARACT EXTRACTION PHACO AND INTRAOCULAR LENS PLACEMENT RIGHT EYE CDE=11.84;  Surgeon: Baruch Goldmann, MD;  Location: [facility];  Service: Ophthalmology;  Laterality: Right;  right   CYSTOSCOPY WITH HOLMIUM LASER LITHOTRIPSY  1990's   EP IMPLANTABLE DEVICE N/A 05/21/2015   MDT Advisa DR MRI pacemaker implanted by Dr Curt Bears   KNEE ARTHROSCOPY Right ~ [place]  ~ 2008   TRANSURETHRAL RESECTION OF BLADDER TUMOR WITH GYRUS (TURBT-GYRUS)  1990's?    Current Outpatient Medications  Medication Sig Dispense Refill   acetaminophen (TYLENOL) 500 MG tablet Take 500 mg by mouth every 6 (six) hours as needed (pain).      albuterol (PROVENTIL) (2.5 MG/3ML) 0.083% nebulizer solution Take 2.5 mg by nebulization every 6 (six) hours as needed for wheezing.     albuterol (VENTOLIN HFA) 108 (90 Base) MCG/ACT inhaler SMARTSIG:1-2 Puff(s) Via Inhaler Every 4-6 Hours  PRN     Alcohol Swabs 70 % PADS Use as directed 120 each 2   ALPRAZolam (XANAX) 0.5 MG tablet Take 0.5 mg by mouth 2 (two) times daily as needed.  1   BD PEN NEEDLE NANO U/F 32G X 4 MM MISC USE AS DIRECTED FOUR TIMES DAILY 200 each 2   calcitRIOL (ROCALTROL) 0.25 MCG capsule Take 0.25 mcg by mouth 3 (three) times a week.     carvedilol (COREG) 6.25 MG tablet TAKE ONE TABLET BY MOUTH TWICE DAILY WITH MEALS 180 tablet 3   Cholecalciferol (VITAMIN D-3) 1000 UNITS CAPS Take 1,000  Units by mouth 2 (two) times daily.      cholestyramine light (PREVALITE) 4 g packet SMARTSIG:1 Packet(s) By Mouth Twice Daily PRN     citalopram (CELEXA) 40 MG tablet Take 20-40 mg by mouth daily.      Continuous Blood Gluc Receiver (FREESTYLE LIBRE 2 READER) DEVI USE WITH SENSORS TO MONITOR BLOOD SUGAR. 1 each 0   Continuous Blood Gluc Sensor (FREESTYLE LIBRE 2 SENSOR) MISC APPLY ONE SENSOR EVERY 14 DAYS AS DIRECTED 2 each 2   ELIQUIS 5 MG TABS tablet TAKE ONE TABLET BY MOUTH TWICE DAILY 60 tablet 6   ENTRESTO 24-26 MG TAKE ONE TABLET BY MOUTH TWICE DAILY 240 tablet 1   furosemide (LASIX) 40 MG tablet TAKE TWO (2) TABLETS BY MOUTH THREE TIMES WEEKLY AND ONE TABLET FOUR TIMES WEEKLY. 40 tablet 1   gabapentin (NEURONTIN) 400 MG capsule Take 400 mg by mouth 3 (three) times daily.      IBU 800 MG tablet Take 800 mg by mouth every 6 (six) hours as needed.     insulin glargine (LANTUS SOLOSTAR) 100 UNIT/ML Solostar Pen Inject 70 Units into the skin at bedtime. 60 mL 3   Insulin Pen Needle (B-D ULTRAFINE III SHORT PEN) 31G X 8 MM MISC Use to inject insulin 4 x daily 200 each 3   ipratropium-albuterol (DUONEB) 0.5-2.5 (3) MG/3ML SOLN Take 3 mLs by nebulization every 4 (four) hours as needed (for shortness of breath).     Lancets (FREESTYLE) lancets USE TO CHECK BLOOD SUGAR FOUR TIMES DAILY. 200 each 5   magnesium oxide (MAG-OX) 400 (240 Mg) MG tablet Take 1 tablet by mouth daily.     metoCLOPramide (REGLAN) 5 MG tablet Take 5 mg by mouth every 6 (six) hours as needed for nausea or vomiting.     nitroGLYCERIN (NITROSTAT) 0.4 MG SL tablet DISSOLVE ONE TABLET UNDER TONGUE EVERY 5 MINUTES UP TO 3 DOSES AS NEEDED FOR CHEST PAIN (IF NO RELIEF AFTER 2ND DOSE GO TO ED OR CALL 911) 25 tablet 3   NON FORMULARY 1 each by Other route See admin instructions. CPAP - Uses nightly     NOVOLOG FLEXPEN 100 UNIT/ML FlexPen Inject 20-26 Units into the skin 3 (three) times daily with meals. 75 mL 0   simvastatin (ZOCOR) 40  MG tablet TAKE ONE TABLET BY MOUTH AT BEDTIME 90 tablet 3   traMADol (ULTRAM) 50 MG tablet Take 1 tablet by mouth daily as needed (pain).      TRELEGY ELLIPTA 100-62.5-25 MCG/INH AEPB Inhale 1 puff into the lungs daily.     triamcinolone cream (KENALOG) 0.1 % Apply 1 application topically 2 (two) times daily as needed.     vitamin B-12 (CYANOCOBALAMIN) 1000 MCG tablet Take 1,000 mcg by mouth 2 (two) times daily.     VOLTAREN 1 % GEL Apply 2 g topically 4 (four) times  daily as needed (for pain).      No current facility-administered medications for this visit.   Allergies:  Patient has no known allergies.   ROS: No syncope.  Physical Exam: VS:  BP 116/64   Pulse 74   Ht '5\' 9"'$  (1.753 m)   Wt 244 lb 6.4 oz (110.9 kg)   SpO2 93%   BMI 36.09 kg/m , BMI Body mass index is 36.09 kg/m.  Wt Readings from Last 3 Encounters:  05/18/22 244 lb 6.4 oz (110.9 kg)  05/04/22 240 lb 12.8 oz (109.2 kg)  03/10/22 235 lb 9.6 oz (106.9 kg)    General: Patient appears comfortable at rest. HEENT: Conjunctiva and lids normal. Neck: Supple, no elevated JVP or carotid bruits. Lungs: Clear to auscultation, nonlabored breathing at rest. Cardiac: Regular rate and rhythm, no S3, 1/6 systolic murmur. Extremities: Mild ankle edema.  ECG:  An ECG dated 01/03/2021 was personally reviewed today and demonstrated:  Ventricular paced rhythm with PVC.  Recent Labwork: 12/03/2021: TSH 1.98 02/05/2022: ALT 14; AST 16; BUN 17; Creatinine, Ser 1.75; Hemoglobin 16.6; Platelets 141; Potassium 4.5; Sodium 137     Component Value Date/Time   CHOL 101 12/03/2021 0000   TRIG 113 12/03/2021 0000   HDL 32 (A) 12/03/2021 0000   LDLCALC 46 12/03/2021 0000    Other Studies Reviewed Today:  Echocardiogram 12/09/2021:  1. Left ventricular ejection fraction, by estimation, is 50 to 55%. The  left ventricle has low normal function. Left ventricular endocardial  border not optimally defined to evaluate regional wall motion. There  is  moderate left ventricular hypertrophy.  Left ventricular diastolic parameters are indeterminate.   2. Right ventricular systolic function is normal. The right ventricular  size is normal. Tricuspid regurgitation signal is inadequate for assessing  PA pressure.   3. The mitral valve is normal in structure. Trivial mitral valve  regurgitation. No evidence of mitral stenosis.   4. The tricuspid valve is abnormal.   5. The aortic valve has an indeterminant number of cusps. Aortic valve  regurgitation is mild.   6. The inferior vena cava is normal in size with greater than 50%  respiratory variability, suggesting right atrial pressure of 3 mmHg.   Assessment and Plan:  1.  Persistent atrial fibrillation with CHA2DS2-VASc score of 4.  Approximately 35% rhythm burden by most recent device interrogation.  He reports no palpitations remains on Eliquis for stroke prophylaxis.  2.  HFrecEF with LVEF 50 to 55% by most recent assessment, history of nonischemic cardiomyopathy.  Continue Coreg, Entresto, and Lasix.  Not on MRA due to renal insufficiency.  Have not added SGLT2 inhibitor given clinical stability although still an option.  3.  CKD stage IIIb, follows with Dr. Theador Hawthorne.  Last creatinine 1.75 with normal potassium.  4.  Symptomatic complete heart block status post Medtronic pacemaker, now to follow-up with Dr. Myles Gip.  Medication Adjustments/Labs and Tests Ordered: Current medicines are reviewed at length with the patient today.  Concerns regarding medicines are outlined above.   Tests Ordered: Orders Placed This Encounter  Procedures   EKG 12-Lead    Medication Changes: No orders of the defined types were placed in this encounter.   Disposition:  Follow up  6 months.  Signed, [name], MD, [facility] 05/18/2022 4:14 PM    [facility] [place], [place], [place] [address] Phone: [phone]; Fax: [phone]

## 2022-05-18 NOTE — Patient Instructions (Addendum)

## 2022-05-20 ENCOUNTER — Other Ambulatory Visit: Payer: Self-pay | Admitting: Nurse Practitioner

## 2022-05-20 NOTE — Telephone Encounter (Signed)
Spoke with the patient and his wife and scheduled him for an appointment with Dr. Myles Gip in [place].

## 2022-05-25 DIAGNOSIS — Z6835 Body mass index (BMI) 35.0-35.9, adult: Secondary | ICD-10-CM | POA: Diagnosis not present

## 2022-05-25 DIAGNOSIS — I5042 Chronic combined systolic (congestive) and diastolic (congestive) heart failure: Secondary | ICD-10-CM | POA: Diagnosis not present

## 2022-05-25 DIAGNOSIS — Z7901 Long term (current) use of anticoagulants: Secondary | ICD-10-CM | POA: Diagnosis not present

## 2022-05-25 DIAGNOSIS — D692 Other nonthrombocytopenic purpura: Secondary | ICD-10-CM | POA: Diagnosis not present

## 2022-05-25 DIAGNOSIS — N1832 Chronic kidney disease, stage 3b: Secondary | ICD-10-CM | POA: Diagnosis not present

## 2022-06-10 ENCOUNTER — Other Ambulatory Visit: Payer: Self-pay | Admitting: Nurse Practitioner

## 2022-06-11 ENCOUNTER — Ambulatory Visit (INDEPENDENT_AMBULATORY_CARE_PROVIDER_SITE_OTHER): Payer: PPO

## 2022-06-11 DIAGNOSIS — I442 Atrioventricular block, complete: Secondary | ICD-10-CM

## 2022-06-11 LAB — CUP PACEART REMOTE DEVICE CHECK
Battery Remaining Longevity: 9 mo
Battery Voltage: 2.87 V
Brady Statistic AP VP Percent: 0 %
Brady Statistic AP VS Percent: 0 %
Brady Statistic AS VP Percent: 91.81 %
Brady Statistic AS VS Percent: 8.19 %
Brady Statistic RA Percent Paced: 0 %
Brady Statistic RV Percent Paced: 93.53 %
Date Time Interrogation Session: 20231207105954
Implantable Lead Connection Status: 753985
Implantable Lead Connection Status: 753985
Implantable Lead Implant Date: 20161115
Implantable Lead Implant Date: 20161115
Implantable Lead Location: 753859
Implantable Lead Location: 753860
Implantable Lead Model: 5076
Implantable Lead Model: 5076
Implantable Pulse Generator Implant Date: 20161115
Lead Channel Impedance Value: 342 Ohm
Lead Channel Impedance Value: 361 Ohm
Lead Channel Impedance Value: 456 Ohm
Lead Channel Impedance Value: 475 Ohm
Lead Channel Pacing Threshold Amplitude: 0.5 V
Lead Channel Pacing Threshold Amplitude: 1 V
Lead Channel Pacing Threshold Pulse Width: 0.4 ms
Lead Channel Pacing Threshold Pulse Width: 0.4 ms
Lead Channel Sensing Intrinsic Amplitude: 26.25 mV
Lead Channel Sensing Intrinsic Amplitude: 26.25 mV
Lead Channel Sensing Intrinsic Amplitude: 4.25 mV
Lead Channel Sensing Intrinsic Amplitude: 4.25 mV
Lead Channel Setting Pacing Amplitude: 2.5 V
Lead Channel Setting Pacing Pulse Width: 0.4 ms
Lead Channel Setting Sensing Sensitivity: 2.8 mV
Zone Setting Status: 755011
Zone Setting Status: 755011

## 2022-06-12 ENCOUNTER — Encounter: Payer: Self-pay | Admitting: Cardiovascular Disease

## 2022-06-12 ENCOUNTER — Ambulatory Visit: Payer: PPO | Attending: Cardiovascular Disease | Admitting: Cardiovascular Disease

## 2022-06-12 VITALS — BP 116/60 | HR 75 | Ht 69.0 in | Wt 243.8 lb

## 2022-06-12 DIAGNOSIS — I472 Ventricular tachycardia, unspecified: Secondary | ICD-10-CM | POA: Diagnosis not present

## 2022-06-12 DIAGNOSIS — Z95810 Presence of automatic (implantable) cardiac defibrillator: Secondary | ICD-10-CM

## 2022-06-12 DIAGNOSIS — I484 Atypical atrial flutter: Secondary | ICD-10-CM | POA: Diagnosis not present

## 2022-06-12 MED ORDER — METOPROLOL SUCCINATE ER 50 MG PO TB24: 50.0000 mg | ORAL_TABLET | Freq: Every day | ORAL | 6 refills | Status: DC

## 2022-06-12 NOTE — Progress Notes (Signed)
PCP: Glenda Chroman, MD Primary Cardiologist: Dr Domenic Polite Primary EP:  Dr Odette Fraction ZACKARIAH VANDERPOL is a 76 y.o. male who presents today for routine electrophysiology followup.  Since last being seen in our clinic, the patient reports doing reasonably well.  He is not active.  + dependant edema.  + chronic SOB with activity.  Today, he denies symptoms of palpitations, chest pain,  dizziness, presyncope, or syncope.  The patient is otherwise without complaint today.   Past Medical History:  Diagnosis Date   Ankylosing spondylitis (HCC)    Anxiety    Aortic root dilatation (HCC)    AR (aortic regurgitation)    Atrial fibrillation and flutter (HCC)    Bladder cancer (HCC)    Chronic pain    Followed at [facility]   COPD (chronic obstructive pulmonary disease) (HCC)    DDD (degenerative disc disease), lumbosacral    DDD (degenerative disc disease), thoracolumbar    Degenerative cervical disc    Depression    Diabetes mellitus type II    Diverticulitis    Essential hypertension    GERD (gastroesophageal reflux disease)    History of kidney stones    Hyperlipidemia    Migraine    Nonischemic cardiomyopathy ([place])    Cardiolite 12/09 LVEF 55%, minor luminal irregularities at cardiac catheterization   OSA on CPAP    Rheumatoid arthritis (HCC)    Symptomatic bradycardia    Medtronic Advisa L dual-chamber pacemaker  05/2015 - Dr. Curt Bears   Past Surgical History:  Procedure Laterality Date   CARDIAC CATHETERIZATION  08/13/11   CARDIOVERSION N/A 07/02/2015   Procedure: CARDIOVERSION;  Surgeon: Sanda Klein, MD;  Location: [facility];  Service: Cardiovascular;  Laterality: N/A;   CATARACT EXTRACTION W/PHACO Left 02/10/2019   Procedure: CATARACT EXTRACTION PHACO AND INTRAOCULAR LENS PLACEMENT ([place]);  Surgeon: Baruch Goldmann, MD;  Location: [facility];  Service: Ophthalmology;  Laterality: Left;  CDE: 7.10   CATARACT EXTRACTION W/PHACO Right 02/27/2019   Procedure: CATARACT EXTRACTION PHACO AND  INTRAOCULAR LENS PLACEMENT RIGHT EYE CDE=11.84;  Surgeon: Baruch Goldmann, MD;  Location: [facility];  Service: Ophthalmology;  Laterality: Right;  right   CYSTOSCOPY WITH HOLMIUM LASER LITHOTRIPSY  1990's   EP IMPLANTABLE DEVICE N/A 05/21/2015   MDT Advisa DR MRI pacemaker implanted by Dr Curt Bears   KNEE ARTHROSCOPY Right ~ [facility]  ~ 2008   TRANSURETHRAL RESECTION OF BLADDER TUMOR WITH GYRUS (TURBT-GYRUS)  1990's?    ROS- all systems are reviewed and negative except as per HPI above  Current Outpatient Medications  Medication Sig Dispense Refill   acetaminophen (TYLENOL) 500 MG tablet Take 500 mg by mouth every 6 (six) hours as needed (pain).      albuterol (PROVENTIL) (2.5 MG/3ML) 0.083% nebulizer solution Take 2.5 mg by nebulization every 6 (six) hours as needed for wheezing.     albuterol (VENTOLIN HFA) 108 (90 Base) MCG/ACT inhaler SMARTSIG:1-2 Puff(s) Via Inhaler Every 4-6 Hours PRN     Alcohol Swabs 70 % PADS Use as directed 120 each 2   ALPRAZolam (XANAX) 0.5 MG tablet Take 0.5 mg by mouth 2 (two) times daily as needed.  1   BD PEN NEEDLE NANO U/F 32G X 4 MM MISC USE AS DIRECTED FOUR TIMES DAILY 200 each 2   calcitRIOL (ROCALTROL) 0.25 MCG capsule Take 0.25 mcg by mouth 3 (three) times a week.     carvedilol (COREG) 6.25 MG tablet TAKE ONE TABLET BY MOUTH TWICE  DAILY WITH MEALS 180 tablet 3   Cholecalciferol (VITAMIN D-3) 1000 UNITS CAPS Take 1,000 Units by mouth 2 (two) times daily.      cholestyramine light (PREVALITE) 4 g packet SMARTSIG:1 Packet(s) By Mouth Twice Daily PRN     citalopram (CELEXA) 40 MG tablet Take 20-40 mg by mouth daily.      Continuous Blood Gluc Receiver (FREESTYLE LIBRE 2 READER) DEVI USE WITH SENSORS TO MONITOR BLOOD SUGAR. 1 each 0   Continuous Blood Gluc Sensor (FREESTYLE LIBRE 2 SENSOR) MISC APPLY ONE SENSOR EVERY 14 DAYS AS DIRECTED 2 each 2   ELIQUIS 5 MG TABS tablet TAKE ONE TABLET BY MOUTH TWICE DAILY 60 tablet 6   ENTRESTO  24-26 MG TAKE ONE TABLET BY MOUTH TWICE DAILY 240 tablet 1   furosemide (LASIX) 40 MG tablet TAKE TWO (2) TABLETS BY MOUTH THREE TIMES WEEKLY AND ONE TABLET FOUR TIMES WEEKLY. 40 tablet 1   gabapentin (NEURONTIN) 400 MG capsule Take 400 mg by mouth 3 (three) times daily.      IBU 800 MG tablet Take 800 mg by mouth every 6 (six) hours as needed.     insulin glargine (LANTUS SOLOSTAR) 100 UNIT/ML Solostar Pen Inject 70 Units into the skin at bedtime. 60 mL 3   Insulin Pen Needle (B-D ULTRAFINE III SHORT PEN) 31G X 8 MM MISC Use to inject insulin 4 x daily 200 each 3   ipratropium-albuterol (DUONEB) 0.5-2.5 (3) MG/3ML SOLN Take 3 mLs by nebulization every 4 (four) hours as needed (for shortness of breath).     Lancets (FREESTYLE) lancets USE TO CHECK BLOOD SUGAR FOUR TIMES DAILY. 200 each 5   magnesium oxide (MAG-OX) 400 (240 Mg) MG tablet Take 1 tablet by mouth daily.     metoCLOPramide (REGLAN) 5 MG tablet Take 5 mg by mouth every 6 (six) hours as needed for nausea or vomiting.     nitroGLYCERIN (NITROSTAT) 0.4 MG SL tablet DISSOLVE ONE TABLET UNDER TONGUE EVERY 5 MINUTES UP TO 3 DOSES AS NEEDED FOR CHEST PAIN (IF NO RELIEF AFTER 2ND DOSE GO TO ED OR CALL 911) 25 tablet 3   NON FORMULARY 1 each by Other route See admin instructions. CPAP - Uses nightly     NOVOLOG FLEXPEN 100 UNIT/ML FlexPen Inject 20-26 Units into the skin 3 (three) times daily with meals. 75 mL 0   simvastatin (ZOCOR) 40 MG tablet TAKE ONE TABLET BY MOUTH AT BEDTIME 90 tablet 3   traMADol (ULTRAM) 50 MG tablet Take 1 tablet by mouth daily as needed (pain).      TRELEGY ELLIPTA 100-62.5-25 MCG/INH AEPB Inhale 1 puff into the lungs daily.     triamcinolone cream (KENALOG) 0.1 % Apply 1 application topically 2 (two) times daily as needed.     vitamin B-12 (CYANOCOBALAMIN) 1000 MCG tablet Take 1,000 mcg by mouth 2 (two) times daily.     VOLTAREN 1 % GEL Apply 2 g topically 4 (four) times daily as needed (for pain).      No current  facility-administered medications for this visit.    Physical Exam: Vitals:   06/12/22 1130  BP: 116/60  Pulse: 75  SpO2: 98%  Weight: 243 lb 12.8 oz (110.6 kg)  Height: '5\' 9"'$  (1.753 m)    GEN- The patient is chronically ill appearing, alert and oriented x 3 today.   Lungs- Clear to ausculation bilaterally, normal work of breathing Chest- pacemaker pocket is well healed Heart- Regular rate and rhythm,  (  paced) GI- soft, NT, ND, + BS Extremities- no clubbing, cyanosis, + dependant edema  Pacemaker interrogation- reviewed in detail today,  See PACEART report ECG reviewed today shows atrial flutter with V-pacing   Assessment and Plan:  1. Symptomatic complete heart block Normal pacemaker function See Pace Art report No changes today he is not device dependant today 9 months estimated battery longevity   2. Permanent afib Rate controlled On eliquis Labs from 02/05/22 reviewed  3. VT: asymptomatic, lasted < 1 minute. I don't think risk of extraction and upgrade to ICD is warranted.  - will transition from carvedilol to metoprolol XL 50 daily  4. HTN Stable No change required today  4. Obesity Body mass index is 36 kg/m. Lifestyle modification advised  5. Nonischemic CM (chronic) Ef has recovered (recent echo reviewed)  6. Tobacco Cessation advised He is thinking about quitting.  Risks, benefits and potential toxicities for medications prescribed and/or refilled reviewed with patient today.   Return in 7 months  Melida Quitter, MD 06/12/2022 11:48 AM

## 2022-06-12 NOTE — Patient Instructions (Signed)
Medication Instructions:  Stop Coreg (Carvedilol) Begin Toprol XL '50mg'$  daily Continue all other medications.     Labwork: none  Testing/Procedures: none  Follow-Up: 6 months   Any Other Special Instructions Will Be Listed Below (If Applicable).   If you need a refill on your cardiac medications before your next appointment, please call your pharmacy.

## 2022-06-18 NOTE — Progress Notes (Signed)
Remote pacemaker transmission.   

## 2022-06-18 NOTE — Addendum Note (Signed)
Addended by: Douglass Rivers D on: 06/18/2022 09:09 AM   Modules accepted: Level of Service

## 2022-06-26 DIAGNOSIS — I10 Essential (primary) hypertension: Secondary | ICD-10-CM | POA: Diagnosis not present

## 2022-06-26 DIAGNOSIS — Z125 Encounter for screening for malignant neoplasm of prostate: Secondary | ICD-10-CM | POA: Diagnosis not present

## 2022-06-26 DIAGNOSIS — Z0000 Encounter for general adult medical examination without abnormal findings: Secondary | ICD-10-CM | POA: Diagnosis not present

## 2022-06-26 DIAGNOSIS — Z6837 Body mass index (BMI) 37.0-37.9, adult: Secondary | ICD-10-CM | POA: Diagnosis not present

## 2022-06-26 DIAGNOSIS — F17210 Nicotine dependence, cigarettes, uncomplicated: Secondary | ICD-10-CM | POA: Diagnosis not present

## 2022-06-26 DIAGNOSIS — R5383 Other fatigue: Secondary | ICD-10-CM | POA: Diagnosis not present

## 2022-06-26 DIAGNOSIS — Z1339 Encounter for screening examination for other mental health and behavioral disorders: Secondary | ICD-10-CM | POA: Diagnosis not present

## 2022-06-26 DIAGNOSIS — Z299 Encounter for prophylactic measures, unspecified: Secondary | ICD-10-CM | POA: Diagnosis not present

## 2022-06-26 DIAGNOSIS — Z1331 Encounter for screening for depression: Secondary | ICD-10-CM | POA: Diagnosis not present

## 2022-06-26 DIAGNOSIS — E7800 Pure hypercholesterolemia, unspecified: Secondary | ICD-10-CM | POA: Diagnosis not present

## 2022-06-26 DIAGNOSIS — Z79899 Other long term (current) drug therapy: Secondary | ICD-10-CM | POA: Diagnosis not present

## 2022-06-26 DIAGNOSIS — Z7189 Other specified counseling: Secondary | ICD-10-CM | POA: Diagnosis not present

## 2022-07-03 NOTE — Progress Notes (Signed)
Remote pacemaker transmission.   

## 2022-07-08 ENCOUNTER — Other Ambulatory Visit: Payer: Self-pay | Admitting: Nurse Practitioner

## 2022-07-09 DIAGNOSIS — F17210 Nicotine dependence, cigarettes, uncomplicated: Secondary | ICD-10-CM | POA: Diagnosis not present

## 2022-07-09 DIAGNOSIS — M459 Ankylosing spondylitis of unspecified sites in spine: Secondary | ICD-10-CM | POA: Diagnosis not present

## 2022-07-09 DIAGNOSIS — I10 Essential (primary) hypertension: Secondary | ICD-10-CM | POA: Diagnosis not present

## 2022-07-09 DIAGNOSIS — E1165 Type 2 diabetes mellitus with hyperglycemia: Secondary | ICD-10-CM | POA: Diagnosis not present

## 2022-07-09 DIAGNOSIS — Z79899 Other long term (current) drug therapy: Secondary | ICD-10-CM | POA: Diagnosis not present

## 2022-07-09 DIAGNOSIS — I700 Atherosclerosis of aorta: Secondary | ICD-10-CM | POA: Diagnosis not present

## 2022-07-09 DIAGNOSIS — F419 Anxiety disorder, unspecified: Secondary | ICD-10-CM | POA: Diagnosis not present

## 2022-07-09 DIAGNOSIS — Z299 Encounter for prophylactic measures, unspecified: Secondary | ICD-10-CM | POA: Diagnosis not present

## 2022-07-13 ENCOUNTER — Ambulatory Visit (INDEPENDENT_AMBULATORY_CARE_PROVIDER_SITE_OTHER): Payer: PPO

## 2022-07-13 DIAGNOSIS — I4819 Other persistent atrial fibrillation: Secondary | ICD-10-CM

## 2022-07-15 LAB — CUP PACEART REMOTE DEVICE CHECK
Battery Remaining Longevity: 7 mo
Battery Voltage: 2.87 V
Brady Statistic AP VP Percent: 0 %
Brady Statistic AP VS Percent: 0 %
Brady Statistic AS VP Percent: 91.12 %
Brady Statistic AS VS Percent: 8.88 %
Brady Statistic RA Percent Paced: 0 %
Brady Statistic RV Percent Paced: 92.98 %
Date Time Interrogation Session: 20240105133948
Implantable Lead Connection Status: 753985
Implantable Lead Connection Status: 753985
Implantable Lead Implant Date: 20161115
Implantable Lead Implant Date: 20161115
Implantable Lead Location: 753859
Implantable Lead Location: 753860
Implantable Lead Model: 5076
Implantable Lead Model: 5076
Implantable Pulse Generator Implant Date: 20161115
Lead Channel Impedance Value: 342 Ohm
Lead Channel Impedance Value: 342 Ohm
Lead Channel Impedance Value: 437 Ohm
Lead Channel Impedance Value: 475 Ohm
Lead Channel Pacing Threshold Amplitude: 0.5 V
Lead Channel Pacing Threshold Amplitude: 0.875 V
Lead Channel Pacing Threshold Pulse Width: 0.4 ms
Lead Channel Pacing Threshold Pulse Width: 0.4 ms
Lead Channel Sensing Intrinsic Amplitude: 2.5 mV
Lead Channel Sensing Intrinsic Amplitude: 2.5 mV
Lead Channel Sensing Intrinsic Amplitude: 21.75 mV
Lead Channel Sensing Intrinsic Amplitude: 21.75 mV
Lead Channel Setting Pacing Amplitude: 2.5 V
Lead Channel Setting Pacing Pulse Width: 0.4 ms
Lead Channel Setting Sensing Sensitivity: 2.8 mV
Zone Setting Status: 755011
Zone Setting Status: 755011

## 2022-07-28 ENCOUNTER — Telehealth: Payer: Self-pay | Admitting: *Deleted

## 2022-07-28 ENCOUNTER — Other Ambulatory Visit: Payer: Self-pay | Admitting: *Deleted

## 2022-07-28 DIAGNOSIS — N189 Chronic kidney disease, unspecified: Secondary | ICD-10-CM | POA: Diagnosis not present

## 2022-07-28 MED ORDER — INSULIN LISPRO (1 UNIT DIAL) 100 UNIT/ML (KWIKPEN): 20.0000 [IU] | PEN_INJECTOR | Freq: Three times a day (TID) | SUBCUTANEOUS | 0 refills | Status: DC

## 2022-07-28 NOTE — Telephone Encounter (Signed)
Patty, called on 07/27/2022 late PM and she states that the patient needs his insulin changed from Novolog to Humalog  for insurance purposes. They no longer will cover the Novolog.

## 2022-07-28 NOTE — Telephone Encounter (Signed)
I sent in for Humalog pens for him to [facility].

## 2022-07-29 NOTE — Telephone Encounter (Signed)
Wife called 07/28/22 and ask that they be sent to the [facility] in [place] . This was done and she is aware.

## 2022-07-30 DIAGNOSIS — I429 Cardiomyopathy, unspecified: Secondary | ICD-10-CM | POA: Diagnosis not present

## 2022-07-30 DIAGNOSIS — E211 Secondary hyperparathyroidism, not elsewhere classified: Secondary | ICD-10-CM | POA: Diagnosis not present

## 2022-07-30 DIAGNOSIS — E1122 Type 2 diabetes mellitus with diabetic chronic kidney disease: Secondary | ICD-10-CM | POA: Diagnosis not present

## 2022-07-30 DIAGNOSIS — N189 Chronic kidney disease, unspecified: Secondary | ICD-10-CM | POA: Diagnosis not present

## 2022-07-30 DIAGNOSIS — D751 Secondary polycythemia: Secondary | ICD-10-CM | POA: Diagnosis not present

## 2022-07-30 DIAGNOSIS — D472 Monoclonal gammopathy: Secondary | ICD-10-CM | POA: Diagnosis not present

## 2022-08-12 ENCOUNTER — Other Ambulatory Visit: Payer: Self-pay | Admitting: Nurse Practitioner

## 2022-08-13 ENCOUNTER — Ambulatory Visit (INDEPENDENT_AMBULATORY_CARE_PROVIDER_SITE_OTHER): Payer: PPO

## 2022-08-13 DIAGNOSIS — I442 Atrioventricular block, complete: Secondary | ICD-10-CM

## 2022-08-13 LAB — CUP PACEART REMOTE DEVICE CHECK
Battery Remaining Longevity: 5 mo
Battery Voltage: 2.86 V
Brady Statistic AP VP Percent: 0 %
Brady Statistic AP VS Percent: 0 %
Brady Statistic AS VP Percent: 89.04 %
Brady Statistic AS VS Percent: 10.96 %
Brady Statistic RA Percent Paced: 0 %
Brady Statistic RV Percent Paced: 91.02 %
Date Time Interrogation Session: 20240208123114
Implantable Lead Connection Status: 753985
Implantable Lead Connection Status: 753985
Implantable Lead Implant Date: 20161115
Implantable Lead Implant Date: 20161115
Implantable Lead Location: 753859
Implantable Lead Location: 753860
Implantable Lead Model: 5076
Implantable Lead Model: 5076
Implantable Pulse Generator Implant Date: 20161115
Lead Channel Impedance Value: 342 Ohm
Lead Channel Impedance Value: 361 Ohm
Lead Channel Impedance Value: 437 Ohm
Lead Channel Impedance Value: 475 Ohm
Lead Channel Pacing Threshold Amplitude: 0.5 V
Lead Channel Pacing Threshold Amplitude: 0.875 V
Lead Channel Pacing Threshold Pulse Width: 0.4 ms
Lead Channel Pacing Threshold Pulse Width: 0.4 ms
Lead Channel Sensing Intrinsic Amplitude: 27.5 mV
Lead Channel Sensing Intrinsic Amplitude: 27.5 mV
Lead Channel Sensing Intrinsic Amplitude: 4.5 mV
Lead Channel Sensing Intrinsic Amplitude: 4.5 mV
Lead Channel Setting Pacing Amplitude: 2.5 V
Lead Channel Setting Pacing Pulse Width: 0.4 ms
Lead Channel Setting Sensing Sensitivity: 2.8 mV
Zone Setting Status: 755011
Zone Setting Status: 755011

## 2022-08-24 NOTE — Progress Notes (Signed)
Remote pacemaker transmission.   

## 2022-08-24 NOTE — Addendum Note (Signed)
Addended by: Douglass Rivers D on: 08/24/2022 03:44 PM   Modules accepted: Level of Service

## 2022-09-01 ENCOUNTER — Other Ambulatory Visit: Payer: Self-pay | Admitting: *Deleted

## 2022-09-01 DIAGNOSIS — E1122 Type 2 diabetes mellitus with diabetic chronic kidney disease: Secondary | ICD-10-CM

## 2022-09-01 MED ORDER — LANTUS SOLOSTAR 100 UNIT/ML ~~LOC~~ SOPN: PEN_INJECTOR | SUBCUTANEOUS | 3 refills | Status: DC

## 2022-09-01 NOTE — Telephone Encounter (Signed)
A prescription was sent to the [facility] in [place] for the patient's Lantus. It was noted from last office visit, that he was to start the Lantus 70 units at supper. Wife was called and made aware that this had been done.

## 2022-09-04 ENCOUNTER — Encounter: Payer: PPO | Admitting: Internal Medicine

## 2022-09-04 ENCOUNTER — Encounter: Payer: PPO | Admitting: Cardiovascular Disease

## 2022-09-07 ENCOUNTER — Ambulatory Visit (INDEPENDENT_AMBULATORY_CARE_PROVIDER_SITE_OTHER): Payer: PPO | Admitting: Nurse Practitioner

## 2022-09-07 ENCOUNTER — Encounter: Payer: Self-pay | Admitting: Nurse Practitioner

## 2022-09-07 VITALS — BP 142/88 | HR 83 | Ht 69.0 in | Wt 234.2 lb

## 2022-09-07 DIAGNOSIS — N1832 Chronic kidney disease, stage 3b: Secondary | ICD-10-CM

## 2022-09-07 DIAGNOSIS — E1122 Type 2 diabetes mellitus with diabetic chronic kidney disease: Secondary | ICD-10-CM

## 2022-09-07 DIAGNOSIS — F17200 Nicotine dependence, unspecified, uncomplicated: Secondary | ICD-10-CM | POA: Diagnosis not present

## 2022-09-07 DIAGNOSIS — I10 Essential (primary) hypertension: Secondary | ICD-10-CM | POA: Diagnosis not present

## 2022-09-07 DIAGNOSIS — Z794 Long term (current) use of insulin: Secondary | ICD-10-CM

## 2022-09-07 DIAGNOSIS — E782 Mixed hyperlipidemia: Secondary | ICD-10-CM

## 2022-09-07 LAB — POCT GLYCOSYLATED HEMOGLOBIN (HGB A1C): Hemoglobin A1C: 8.4 % — AB (ref 4.0–5.6)

## 2022-09-07 MED ORDER — INSULIN LISPRO (1 UNIT DIAL) 100 UNIT/ML (KWIKPEN): 20.0000 [IU] | PEN_INJECTOR | Freq: Three times a day (TID) | SUBCUTANEOUS | 3 refills | Status: DC

## 2022-09-07 NOTE — Progress Notes (Signed)
Remote pacemaker transmission.   

## 2022-09-07 NOTE — Progress Notes (Signed)
09/07/2022            Endocrinology follow-up note   Subjective:    Patient ID: Dylan Tyler, male    DOB: 08-13-1945.  he is being seen in follow-up  for management of currently uncontrolled, complicated type 2 diabetes, hyperlipidemia, hypertension.   PMD:  Glenda Chroman, MD.   Past Medical History:  Diagnosis Date   Ankylosing spondylitis ([place])    Anxiety    Aortic root dilatation (HCC)    AR (aortic regurgitation)    Atrial fibrillation and flutter (HCC)    Bladder cancer (HCC)    Chronic pain    Followed at [facility]   COPD (chronic obstructive pulmonary disease) (HCC)    DDD (degenerative disc disease), lumbosacral    DDD (degenerative disc disease), thoracolumbar    Degenerative cervical disc    Depression    Diabetes mellitus type II    Diverticulitis    Essential hypertension    GERD (gastroesophageal reflux disease)    History of kidney stones    Hyperlipidemia    Migraine    Nonischemic cardiomyopathy ([place])    Cardiolite 12/09 LVEF 55%, minor luminal irregularities at cardiac catheterization   OSA on CPAP    Rheumatoid arthritis (HCC)    Symptomatic bradycardia    Medtronic Advisa L dual-chamber pacemaker  05/2015 - Dr. Curt Bears   Past Surgical History:  Procedure Laterality Date   CARDIAC CATHETERIZATION  08/13/11   CARDIOVERSION N/A 07/02/2015   Procedure: CARDIOVERSION;  Surgeon: Sanda Klein, MD;  Location: [facility];  Service: Cardiovascular;  Laterality: N/A;   CATARACT EXTRACTION W/PHACO Left 02/10/2019   Procedure: CATARACT EXTRACTION PHACO AND INTRAOCULAR LENS PLACEMENT ([place]);  Surgeon: Baruch Goldmann, MD;  Location: [facility];  Service: Ophthalmology;  Laterality: Left;  CDE: 7.10   CATARACT EXTRACTION W/PHACO Right 02/27/2019   Procedure: CATARACT EXTRACTION PHACO AND INTRAOCULAR LENS PLACEMENT RIGHT EYE CDE=11.84;  Surgeon: Baruch Goldmann, MD;  Location: [facility];  Service: Ophthalmology;  Laterality: Right;  right   CYSTOSCOPY WITH HOLMIUM LASER  LITHOTRIPSY  1990's   EP IMPLANTABLE DEVICE N/A 05/21/2015   MDT Advisa DR MRI pacemaker implanted by Dr Curt Bears   KNEE ARTHROSCOPY Right ~ [place]  ~ 2008   TRANSURETHRAL RESECTION OF BLADDER TUMOR WITH GYRUS (TURBT-GYRUS)  1990's?   Social History   Socioeconomic History   Marital status: Married    Spouse name: Not on file   Number of children: Not on file   Years of education: Not on file   Highest education level: Not on file  Occupational History   Occupation: Disabled    Employer: DISABLED  Tobacco Use   Smoking status: Every Day    Packs/day: 0.50    Years: 50.00    Total pack years: 25.00    Types: E-cigarettes, Cigarettes    Start date: 02/26/1959   Smokeless tobacco: Former    Types: Chew   Tobacco comments:    05/21/2015 "quit chewing in 1992"  Vaping Use   Vaping Use: Every day   Substances: Nicotine, Flavoring  Substance and Sexual Activity   Alcohol use: Yes    Alcohol/week: 2.0 standard drinks of alcohol    Types: 2 Cans of beer per week   Drug use: Yes    Types: Cocaine    Comment: 05/21/2015 "quit in the early 2000"   Sexual activity: Not on file  Other Topics Concern   Not on file  Social  History Narrative   Divorced   No regular exercise   Social Determinants of Health   Financial Resource Strain: Not on file  Food Insecurity: Not on file  Transportation Needs: Not on file  Physical Activity: Not on file  Stress: Not on file  Social Connections: Not on file   Outpatient Encounter Medications as of 09/07/2022  Medication Sig   acetaminophen (TYLENOL) 500 MG tablet Take 500 mg by mouth every 6 (six) hours as needed (pain).    albuterol (PROVENTIL) (2.5 MG/3ML) 0.083% nebulizer solution Take 2.5 mg by nebulization every 6 (six) hours as needed for wheezing.   albuterol (VENTOLIN HFA) 108 (90 Base) MCG/ACT inhaler SMARTSIG:1-2 Puff(s) Via Inhaler Every 4-6 Hours PRN   Alcohol Swabs 70 % PADS Use as directed    ALPRAZolam (XANAX) 0.5 MG tablet Take 0.5 mg by mouth 2 (two) times daily as needed.   BD PEN NEEDLE NANO U/F 32G X 4 MM MISC USE AS DIRECTED FOUR TIMES DAILY   calcitRIOL (ROCALTROL) 0.25 MCG capsule Take 0.25 mcg by mouth 3 (three) times a week.   Cholecalciferol (VITAMIN D-3) 1000 UNITS CAPS Take 1,000 Units by mouth 2 (two) times daily.    cholestyramine light (PREVALITE) 4 g packet SMARTSIG:1 Packet(s) By Mouth Twice Daily PRN   citalopram (CELEXA) 40 MG tablet Take 20-40 mg by mouth daily.    Continuous Blood Gluc Receiver (FREESTYLE LIBRE 2 READER) DEVI USE WITH SENSORS TO MONITOR BLOOD SUGAR.   Continuous Blood Gluc Sensor (FREESTYLE LIBRE 2 SENSOR) MISC APPLY ONE SENSOR EVERY 14 DAYS AS DIRECTED   ELIQUIS 5 MG TABS tablet TAKE ONE TABLET BY MOUTH TWICE DAILY   ENTRESTO 24-26 MG TAKE ONE TABLET BY MOUTH TWICE DAILY   furosemide (LASIX) 40 MG tablet TAKE TWO (2) TABLETS BY MOUTH THREE TIMES WEEKLY AND ONE TABLET FOUR TIMES WEEKLY.   gabapentin (NEURONTIN) 400 MG capsule Take 400 mg by mouth 3 (three) times daily.    IBU 800 MG tablet Take 800 mg by mouth every 6 (six) hours as needed.   insulin glargine (LANTUS SOLOSTAR) 100 UNIT/ML Solostar Pen Patient is to inject 70 units at supper.   Insulin Pen Needle (B-D ULTRAFINE III SHORT PEN) 31G X 8 MM MISC Use to inject insulin 4 x daily   ipratropium-albuterol (DUONEB) 0.5-2.5 (3) MG/3ML SOLN Take 3 mLs by nebulization every 4 (four) hours as needed (for shortness of breath).   Lancets (FREESTYLE) lancets USE TO CHECK BLOOD SUGAR FOUR TIMES DAILY.   magnesium oxide (MAG-OX) 400 (240 Mg) MG tablet Take 1 tablet by mouth daily.   metoCLOPramide (REGLAN) 5 MG tablet Take 5 mg by mouth every 6 (six) hours as needed for nausea or vomiting.   metoprolol succinate (TOPROL-XL) 50 MG 24 hr tablet Take 1 tablet (50 mg total) by mouth daily.   nitroGLYCERIN (NITROSTAT) 0.4 MG SL tablet DISSOLVE ONE TABLET UNDER TONGUE EVERY 5 MINUTES UP TO 3 DOSES AS  NEEDED FOR CHEST PAIN (IF NO RELIEF AFTER 2ND DOSE GO TO ED OR CALL 911)   NON FORMULARY 1 each by Other route See admin instructions. CPAP - Uses nightly   simvastatin (ZOCOR) 40 MG tablet TAKE ONE TABLET BY MOUTH AT BEDTIME   traMADol (ULTRAM) 50 MG tablet Take 1 tablet by mouth daily as needed (pain).    TRELEGY ELLIPTA 100-62.5-25 MCG/INH AEPB Inhale 1 puff into the lungs daily.   triamcinolone cream (KENALOG) 0.1 % Apply 1 application topically 2 (two) times  daily as needed.   VOLTAREN 1 % GEL Apply 2 g topically 4 (four) times daily as needed (for pain).    [DISCONTINUED] insulin lispro (HUMALOG KWIKPEN) 100 UNIT/ML KwikPen Inject 20-26 Units into the skin 3 (three) times daily with meals.   insulin lispro (HUMALOG KWIKPEN) 100 UNIT/ML KwikPen Inject 20-26 Units into the skin 3 (three) times daily.   vitamin B-12 (CYANOCOBALAMIN) 1000 MCG tablet Take 1,000 mcg by mouth 2 (two) times daily.   No facility-administered encounter medications on file as of 09/07/2022.    ALLERGIES: No Known Allergies  VACCINATION STATUS:  There is no immunization history on file for this patient.  Diabetes He presents for his follow-up diabetic visit. He has type 2 diabetes mellitus. Onset time: He was diagnosed at approximate age of 82 years. His disease course has been fluctuating. There are no hypoglycemic associated symptoms. Pertinent negatives for hypoglycemia include no confusion, hunger, pallor or seizures. Associated symptoms include foot paresthesias. Pertinent negatives for diabetes include no fatigue, no polydipsia, no polyphagia, no polyuria, no weakness and no weight loss (intentional). There are no hypoglycemic complications. Symptoms are stable. Diabetic complications include heart disease, nephropathy and peripheral neuropathy. Risk factors for coronary artery disease include dyslipidemia, diabetes mellitus, hypertension, male sex, sedentary lifestyle, tobacco exposure, family history and  obesity. Current diabetic treatment includes intensive insulin program. He is compliant with treatment most of the time. His weight is decreasing steadily. He is following a generally unhealthy diet. When asked about meal planning, he reported none. He has not had a previous visit with a dietitian. He never [facility] with cane due to bilateral knee arthritis) participates in exercise. His home blood glucose trend is fluctuating dramatically. His overall blood glucose range is >200 mg/dl. (He presents today, accompanied by his wife, with his CGM and logs showing fluctuating glycemic profile.  His POCT A1c today is 8.4%, increasing from last visit of 8.1%.  He notes he continues to miss doses of his insulin due to forgetting.  Analysis of his CGM shows TIR 29%, TAR 71%, TBR 0% with a GMI of 8.4%.) An ACE inhibitor/angiotensin II receptor blocker is being taken. He sees a podiatrist.Eye exam is current.  Hyperlipidemia This is a chronic problem. The current episode started more than 1 year ago. The problem is controlled. Recent lipid tests were reviewed and are normal. Exacerbating diseases include chronic renal disease, diabetes and obesity. Factors aggravating his hyperlipidemia include smoking, fatty foods and beta blockers. Pertinent negatives include no myalgias or shortness of breath. Current antihyperlipidemic treatment includes statins. The current treatment provides mild improvement of lipids. Compliance problems include adherence to diet and adherence to exercise.  Risk factors for coronary artery disease include dyslipidemia, diabetes mellitus, hypertension, a sedentary lifestyle, male sex and obesity.  Hypertension This is a chronic problem. The current episode started more than 1 year ago. The problem has been resolved since onset. The problem is controlled. Pertinent negatives include no shortness of breath. There are no associated agents to hypertension. Risk factors for coronary artery disease include  diabetes mellitus, dyslipidemia, family history, male gender, obesity, sedentary lifestyle and smoking/tobacco exposure. Past treatments include beta blockers and diuretics. The current treatment provides significant improvement. Compliance problems include diet and exercise.  Hypertensive end-organ damage includes kidney disease and CAD/MI. Identifiable causes of hypertension include chronic renal disease.    Review of systems  Constitutional: + decreasing body weight,  current  Body mass index is 34.59 kg/m. , + fatigue, no subjective hyperthermia,  no subjective hypothermia Eyes: no blurry vision, no xerophthalmia ENT: no sore throat, no nodules palpated in throat, no dysphagia/odynophagia, no hoarseness Cardiovascular: no Chest Pain, no Shortness of Breath, no palpitations, no leg swelling Respiratory: chronic cough (smoker), intermittent shortness of breath Gastrointestinal: no Nausea/Vomiting/Diarrhea Musculoskeletal: bilateral knee pain (under evaluation for bilateral TKR)  Skin: no rashes, no hyperemia, multiple nonhealing wounds to BLE Neurological: no tremors, no numbness, no tingling, + intermittent dizziness Psychiatric: no depression, no anxiety   Objective:    BP (!) 142/88 (BP Location: Right Arm, Patient Position: Sitting, Cuff Size: Large) Comment: Retake Manuel Cuff  Pulse 83   Ht '5\' 9"'$  (1.753 m)   Wt 234 lb 3.2 oz (106.2 kg)   BMI 34.59 kg/m   Wt Readings from Last 3 Encounters:  09/07/22 234 lb 3.2 oz (106.2 kg)  06/12/22 243 lb 12.8 oz (110.6 kg)  05/18/22 244 lb 6.4 oz (110.9 kg)    BP Readings from Last 3 Encounters:  09/07/22 (!) 142/88  06/12/22 116/60  05/18/22 116/64    Physical Exam- Limited  Constitutional:  Body mass index is 34.59 kg/m. , not in acute distress, normal state of mind Eyes:  EOMI, no exophthalmos Musculoskeletal: no gross deformities, strength intact in all four extremities Skin:  no rashes, no hyperemia Neurological: no tremor  with outstretched hands   Diabetic Foot Exam - Simple   Simple Foot Form Diabetic Foot exam was performed with the following findings: Yes 09/07/2022  1:51 PM  Visual Inspection See comments: Yes Sensation Testing See comments: Yes Pulse Check Posterior Tibialis and Dorsalis pulse intact bilaterally: Yes Comments Dry flaky skin bilaterally, nails in need of trim, decreased sensation to monofilament tool bilaterally.      Lipid Panel     Component Value Date/Time   CHOL 101 12/03/2021 0000   TRIG 113 12/03/2021 0000   HDL 32 (A) 12/03/2021 0000   LDLCALC 46 12/03/2021 0000   Recent Results (from the past 2160 hour(s))  CUP PACEART REMOTE DEVICE CHECK     Status: None   Collection Time: 06/11/22 10:59 AM  Result Value Ref Range   Date Time Interrogation Session [id number]    Pulse Generator Manufacturer MERM    Pulse Gen Model [id number] Advisa DR MRI    Pulse Gen Serial Number [id number]    Clinic Name [facility] Pulse Generator Type Implantable Pulse Generator    Implantable Pulse Generator Implant Date KX:341239    Implantable Lead Manufacturer [facility]    Implantable Lead Model 5076 CapSureFix Novus    Implantable Lead Serial Number [id number]    Implantable Lead Implant Date KX:341239    Implantable Lead Location Detail 1 UNKNOWN    Implantable Lead Location [id number]    Implantable Lead Connection Status [id number]    Implantable Lead Manufacturer [name]    Implantable Lead Model 5076 CapSureFix Novus    Implantable Lead Serial Number [id number]    Implantable Lead Implant Date KX:341239    Implantable Lead Location Detail 1 UNKNOWN    Implantable Lead Location [id number]    Implantable Lead Connection Status [id number]    Lead Channel Setting Sensing Sensitivity 2.8 mV   Lead Channel Setting Pacing Pulse Width 0.4 ms   Lead Channel Setting Pacing Amplitude 2.5 V   Zone Setting Status [id number]    Zone Setting Status 755011    Lead Channel Impedance Value 475 ohm    Lead Channel Impedance Value 342 ohm   Lead Channel  Sensing Intrinsic Amplitude 4.25 mV   Lead Channel Sensing Intrinsic Amplitude 4.25 mV   Lead Channel Pacing Threshold Amplitude 0.5 V   Lead Channel Pacing Threshold Pulse Width 0.4 ms   Lead Channel Impedance Value 456 ohm   Lead Channel Impedance Value 361 ohm   Lead Channel Sensing Intrinsic Amplitude 26.25 mV   Lead Channel Sensing Intrinsic Amplitude 26.25 mV   Lead Channel Pacing Threshold Amplitude 1 V   Lead Channel Pacing Threshold Pulse Width 0.4 ms   Battery Status OK    Battery Remaining Longevity 9 mo   Battery Voltage 2.87 V   Brady Statistic RA Percent Paced 0 %   Brady Statistic RV Percent Paced 93.53 %   Brady Statistic AP VP Percent 0 %   Brady Statistic AS VP Percent 91.81 %   Brady Statistic AP VS Percent 0 %   Brady Statistic AS VS Percent 8.19 %  CUP PACEART REMOTE DEVICE CHECK     Status: None   Collection Time: 07/10/22  1:39 PM  Result Value Ref Range   Date Time Interrogation Session [id number]    Pulse Generator Manufacturer MERM    Pulse Gen Model [id number] Advisa DR MRI    Pulse Gen Serial Number [id number]    Clinic Name [facility]    Implantable Pulse Generator Type Implantable Pulse Generator    Implantable Pulse Generator Implant Date RL:2737661    Implantable Lead Manufacturer MERM    Implantable Lead Model 5076 CapSureFix Novus    Implantable Lead Serial Number [id number]    Implantable Lead Implant Date RL:2737661    Implantable Lead Location Detail 1 UNKNOWN    Implantable Lead Location [id number]    Implantable Lead Connection Status [id number]    Implantable Lead Manufacturer MERM    Implantable Lead Model 5076 CapSureFix Novus    Implantable Lead Serial Number [id number]    Implantable Lead Implant Date RL:2737661    Implantable Lead Location Detail 1 UNKNOWN    Implantable Lead Location [id number]    Implantable Lead Connection Status [id number]    Lead Channel Setting Sensing Sensitivity 2.8 mV    Lead Channel Setting Pacing Pulse Width 0.4 ms   Lead Channel Setting Pacing Amplitude 2.5 V   Zone Setting Status 755011    Zone Setting Status [phone]    Lead Channel Impedance Value 475 ohm   Lead Channel Impedance Value 342 ohm   Lead Channel Sensing Intrinsic Amplitude 2.5 mV   Lead Channel Sensing Intrinsic Amplitude 2.5 mV   Lead Channel Pacing Threshold Amplitude 0.5 V   Lead Channel Pacing Threshold Pulse Width 0.4 ms   Lead Channel Impedance Value 437 ohm   Lead Channel Impedance Value 342 ohm   Lead Channel Sensing Intrinsic Amplitude 21.75 mV   Lead Channel Sensing Intrinsic Amplitude 21.75 mV   Lead Channel Pacing Threshold Amplitude 0.875 V   Lead Channel Pacing Threshold Pulse Width 0.4 ms   Battery Status OK    Battery Remaining Longevity 7 mo   Battery Voltage 2.87 V   Brady Statistic RA Percent Paced 0 %   Brady Statistic RV Percent Paced 92.98 %   Brady Statistic AP VP Percent 0 %   Brady Statistic AS VP Percent 91.12 %   Brady Statistic AP VS Percent 0 %   Brady Statistic AS VS Percent 8.88 %  CUP PACEART REMOTE DEVICE CHECK     Status: None   Collection Time: 08/13/22 12:31 PM  Result Value Ref  Range   Date Time Interrogation Session [phone]    Pulse Generator Manufacturer MERM    Pulse Gen Model [id number] Advisa DR MRI    Pulse Gen Serial Number [id number]    Clinic Name [facility]    Implantable Pulse Generator Type Implantable Pulse Generator    Implantable Pulse Generator Implant Date RL:2737661    Implantable Lead Manufacturer [facility]    Implantable Lead Model 5076 CapSureFix Novus    Implantable Lead Serial Number [id number]    Implantable Lead Implant Date RL:2737661    Implantable Lead Location Detail 1 UNKNOWN    Implantable Lead Location [id number]    Implantable Lead Connection Status [id number]    Implantable Lead Manufacturer [facility]    Implantable Lead Model 5076 CapSureFix Novus    Implantable Lead Serial Number [id number]    Implantable Lead  Implant Date RL:2737661    Implantable Lead Location Detail 1 UNKNOWN    Implantable Lead Location [id number]    Implantable Lead Connection Status [id number]    Lead Channel Setting Sensing Sensitivity 2.8 mV   Lead Channel Setting Pacing Pulse Width 0.4 ms   Lead Channel Setting Pacing Amplitude 2.5 V   Zone Setting Status 755011    Zone Setting Status [phone]    Lead Channel Impedance Value 475 ohm   Lead Channel Impedance Value 361 ohm   Lead Channel Sensing Intrinsic Amplitude 4.5 mV   Lead Channel Sensing Intrinsic Amplitude 4.5 mV   Lead Channel Pacing Threshold Amplitude 0.5 V   Lead Channel Pacing Threshold Pulse Width 0.4 ms   Lead Channel Impedance Value 437 ohm   Lead Channel Impedance Value 342 ohm   Lead Channel Sensing Intrinsic Amplitude 27.5 mV   Lead Channel Sensing Intrinsic Amplitude 27.5 mV   Lead Channel Pacing Threshold Amplitude 0.875 V   Lead Channel Pacing Threshold Pulse Width 0.4 ms   Battery Status OK    Battery Remaining Longevity 5 mo   Battery Voltage 2.86 V   Brady Statistic RA Percent Paced 0 %   Brady Statistic RV Percent Paced 91.02 %   Brady Statistic AP VP Percent 0 %   Brady Statistic AS VP Percent 89.04 %   Brady Statistic AP VS Percent 0 %   Brady Statistic AS VS Percent 10.96 %  HgB A1c     Status: Abnormal   Collection Time: 09/07/22  1:41 PM  Result Value Ref Range   Hemoglobin A1C 8.4 (A) 4.0 - 5.6 %   HbA1c POC (<> result, manual entry)     HbA1c, POC (prediabetic range)     HbA1c, POC (controlled diabetic range)        Latest Ref Rng & Units 02/05/2022    2:08 PM 12/03/2021   12:00 AM 06/25/2020   12:00 AM  CMP  Glucose 70 - 99 mg/dL 249     BUN 8 - 23 mg/dL '17  13  25      '$ Creatinine 0.61 - 1.24 mg/dL 1.75  1.7  1.6      Sodium 135 - 145 mmol/L 137  137    Potassium 3.5 - 5.1 mmol/L 4.5  4.7    Chloride 98 - 111 mmol/L 102  102    CO2 22 - 32 mmol/L 27  28    Calcium 8.9 - 10.3 mg/dL 8.9  8.8  9.2      Total Protein 6.5 - 8.1 g/dL  6.8     Total Bilirubin 0.3 - 1.2 mg/dL 1.2  Alkaline Phos 38 - 126 U/L 63  74    AST 15 - 41 U/L 16  18    ALT 0 - 44 U/L 14  20       This result is from an external source.     Assessment & Plan:   1) Type 2 diabetes mellitus with stage 3 chronic kidney disease, without long-term current use of insulin ([place])  - Patient has currently uncontrolled symptomatic type 2 DM since 77 years of age.  He presents today, accompanied by his wife, with his CGM and logs showing fluctuating glycemic profile.  His POCT A1c today is 8.4%, increasing from last visit of 8.1%.  He notes he continues to miss doses of his insulin due to forgetting.  Analysis of his CGM shows TIR 29%, TAR 71%, TBR 0% with a GMI of 8.4%.  -his diabetes is complicated by stage 3 renal insufficiency (improving), obesity/sedentary life, neuropathy, cardiomyopathy, chronic heavy smoking, inadequate insurance and EZRA BOYAN remains at a high risk for more acute and chronic complications which include CAD, CVA, CKD, retinopathy, and neuropathy. These are all discussed in detail with the patient.  - Nutritional counseling repeated at each appointment due to patients tendency to fall back in to old habits.  - The patient admits there is a room for improvement in their diet and drink choices. -  Suggestion is made for the patient to avoid simple carbohydrates from their diet including Cakes, Sweet Desserts / Pastries, Ice Cream, Soda (diet and regular), Sweet Tea, Candies, Chips, Cookies, Sweet Pastries, Store Bought Juices, Alcohol in Excess of 1-2 drinks a day, Artificial Sweeteners, Coffee Creamer, and "Sugar-free" Products. This will help patient to have stable blood glucose profile and potentially avoid unintended weight gain.   - I encouraged the patient to switch to unprocessed or minimally processed complex starch and increased protein intake (animal or plant source), fruits, and vegetables.   - Patient is advised to  stick to a routine mealtimes to eat 3 meals a day and avoid unnecessary snacks (to snack only to correct hypoglycemia).  - I have approached him with the following individualized plan to manage diabetes and patient agrees:   - Based on his glucose profile, he will continue to need intensive treatment with higher dose of basal/bolus insulin in order for him to achieve control of diabetes to target.    -He is advised to continue his Lantus at 70 units but to start taking it at supper so he can remember it consistently and continue his Novolog 20-26 units TID with meals if glucose is above 90 and he is eating (Specific instructions on how to titrate insulin dosage based on glucose readings given to patient in writing).   I also programmed insulin injection reminders on his [employer] 2 receiver to see if it helps increase compliance.  -He is encouraged to continue using his CGM to monitor blood glucose 4 times per day, before meals and at bedtime and report to the clinic if blood glucose levels are less than 70 or greater than 200 for 3 tests in a row.  - He is not a candidate for metformin therapy due to CKD.  - He is not a candidate for incretin therapy (such as Ozempic) due to increased risk of pancreatitis r/t hypertriglyceridemia and heavy smoking history.  He asked again about starting this medication today.  I advised him to quit smoking and I would consider it, but right now due to high triglycerides  and smoking history, he would be high risk for pancreatitis.  2) Lipids/HPL:  His recent lipid panel from 06/26/22 shows controlled LDL of 26 and significantly elevated triglycerides of 494.  He is advised to continue Cholestyramine 4 g TID with meals and Simvastatin 40 mg po daily at bedtime.  Side effects and precautions discussed with him.  He is advised to avoid fried foods and butter.  3) Hypertension His blood pressure is controlled to target for his age.  He is advised to continue Coreg 6.25 mg  po twice daily, Lasix 40 mg po every other day (alternating with 20 mg), and Entresto 24-26 mg po twice daily.   4) Chronic Care/Health Maintenance: -he is on ACEI/ARB and Statin medications and  is encouraged to continue to follow up with Ophthalmology, Dentist,  Podiatrist at least yearly or according to recommendations. - I have recommended yearly flu vaccine and pneumonia vaccination at least every 5 years; and  sleep for at least 7 hours a day.  The patient was counseled on the dangers of tobacco use, and was advised to quit.  Reviewed strategies to maximize success, including removing cigarettes and smoking materials from environment.  5) Weight management:  His Body mass index is 34.59 kg/m. He is a candidate for modest weight loss.  Exercise regimen and carbs restrictions were detailed with him.   - I advised patient to maintain close follow up with Glenda Chroman, MD for primary care needs.      I spent  47  minutes in the care of the patient today including review of labs from [facility], Lipids, Thyroid Function, Hematology (current and previous including abstractions from other facilities); face-to-face time discussing  his blood glucose readings/logs, discussing hypoglycemia and hyperglycemia episodes and symptoms, medications doses, his options of short and long term treatment based on the latest standards of care / guidelines;  discussion about incorporating lifestyle medicine;  and documenting the encounter. Risk reduction counseling performed per USPSTF guidelines to reduce obesity and cardiovascular risk factors.     Please refer to Patient Instructions for Blood Glucose Monitoring and Insulin/Medications Dosing Guide"  in media tab for additional information. Please  also refer to " Patient Self Inventory" in the Media  tab for reviewed elements of pertinent patient history.  [name] participated in the discussions, expressed understanding, and voiced agreement with the above  plans.  All questions were answered to his satisfaction. he is encouraged to contact clinic should he have any questions or concerns prior to his return visit.   Follow up plan: - Return in about 3 months (around 12/08/2022) for Diabetes F/U with A1c in office, No previsit labs, Bring meter and logs.    Rayetta Pigg, [facility] [address] [place], [place] [address] Phone: [phone] Fax: [phone]   09/07/2022, 2:27 PM

## 2022-09-08 ENCOUNTER — Ambulatory Visit (HOSPITAL_COMMUNITY): Admission: RE | Admit: 2022-09-08 | Payer: PPO | Source: Ambulatory Visit

## 2022-09-08 ENCOUNTER — Inpatient Hospital Stay: Payer: PPO | Attending: Hematology

## 2022-09-14 ENCOUNTER — Ambulatory Visit (INDEPENDENT_AMBULATORY_CARE_PROVIDER_SITE_OTHER): Payer: PPO

## 2022-09-14 DIAGNOSIS — I442 Atrioventricular block, complete: Secondary | ICD-10-CM

## 2022-09-15 ENCOUNTER — Ambulatory Visit: Payer: PPO | Admitting: Physician Assistant

## 2022-09-16 ENCOUNTER — Telehealth: Payer: Self-pay | Admitting: Cardiovascular Disease

## 2022-09-16 LAB — CUP PACEART REMOTE DEVICE CHECK
Battery Remaining Longevity: 3 mo
Battery Voltage: 2.85 V
Brady Statistic AP VP Percent: 0 %
Brady Statistic AP VS Percent: 0 %
Brady Statistic AS VP Percent: 95.84 %
Brady Statistic AS VS Percent: 4.16 %
Brady Statistic RA Percent Paced: 0 %
Brady Statistic RV Percent Paced: 97.03 %
Date Time Interrogation Session: 20240313090939
Implantable Lead Connection Status: 753985
Implantable Lead Connection Status: 753985
Implantable Lead Implant Date: 20161115
Implantable Lead Implant Date: 20161115
Implantable Lead Location: 753859
Implantable Lead Location: 753860
Implantable Lead Model: 5076
Implantable Lead Model: 5076
Implantable Pulse Generator Implant Date: 20161115
Lead Channel Impedance Value: 361 Ohm
Lead Channel Impedance Value: 361 Ohm
Lead Channel Impedance Value: 456 Ohm
Lead Channel Impedance Value: 494 Ohm
Lead Channel Pacing Threshold Amplitude: 0.5 V
Lead Channel Pacing Threshold Amplitude: 0.875 V
Lead Channel Pacing Threshold Pulse Width: 0.4 ms
Lead Channel Pacing Threshold Pulse Width: 0.4 ms
Lead Channel Sensing Intrinsic Amplitude: 2.75 mV
Lead Channel Sensing Intrinsic Amplitude: 2.75 mV
Lead Channel Sensing Intrinsic Amplitude: 26 mV
Lead Channel Sensing Intrinsic Amplitude: 26 mV
Lead Channel Setting Pacing Amplitude: 2.5 V
Lead Channel Setting Pacing Pulse Width: 0.4 ms
Lead Channel Setting Sensing Sensitivity: 2.8 mV
Zone Setting Status: 755011
Zone Setting Status: 755011

## 2022-09-16 MED ORDER — APIXABAN 5 MG PO TABS: 5.0000 mg | ORAL_TABLET | Freq: Two times a day (BID) | ORAL | 5 refills | Status: DC

## 2022-09-16 NOTE — Telephone Encounter (Signed)
Prescription refill request for Eliquis received. Indication: AF Last office visit: 06/12/22  A Mealor MD Scr: 1.85 on 07/28/22 Age: 77 Weight: 110.6kg  Based on above findings Eliquis '5mg'$  twice daily is the appropriate dose.  Refill approved.

## 2022-09-16 NOTE — Telephone Encounter (Signed)
*  STAT* If patient is at the pharmacy, call can be transferred to refill team.   1. Which medications need to be refilled? (please list name of each medication and dose if known)   ELIQUIS 5 MG TABS tablet    2. Which pharmacy/location (including street and city if local pharmacy) is medication to be sent to? [place], [place]   3. Do they need a 30 day or 90 day supply? [place]

## 2022-10-05 DIAGNOSIS — N189 Chronic kidney disease, unspecified: Secondary | ICD-10-CM | POA: Diagnosis not present

## 2022-10-07 DIAGNOSIS — E1122 Type 2 diabetes mellitus with diabetic chronic kidney disease: Secondary | ICD-10-CM | POA: Diagnosis not present

## 2022-10-07 DIAGNOSIS — D751 Secondary polycythemia: Secondary | ICD-10-CM | POA: Diagnosis not present

## 2022-10-07 DIAGNOSIS — N189 Chronic kidney disease, unspecified: Secondary | ICD-10-CM | POA: Diagnosis not present

## 2022-10-07 DIAGNOSIS — E211 Secondary hyperparathyroidism, not elsewhere classified: Secondary | ICD-10-CM | POA: Diagnosis not present

## 2022-10-07 DIAGNOSIS — D472 Monoclonal gammopathy: Secondary | ICD-10-CM | POA: Diagnosis not present

## 2022-10-07 DIAGNOSIS — I429 Cardiomyopathy, unspecified: Secondary | ICD-10-CM | POA: Diagnosis not present

## 2022-10-08 DIAGNOSIS — M459 Ankylosing spondylitis of unspecified sites in spine: Secondary | ICD-10-CM | POA: Diagnosis not present

## 2022-10-08 DIAGNOSIS — R52 Pain, unspecified: Secondary | ICD-10-CM | POA: Diagnosis not present

## 2022-10-08 DIAGNOSIS — F17210 Nicotine dependence, cigarettes, uncomplicated: Secondary | ICD-10-CM | POA: Diagnosis not present

## 2022-10-08 DIAGNOSIS — J449 Chronic obstructive pulmonary disease, unspecified: Secondary | ICD-10-CM | POA: Diagnosis not present

## 2022-10-08 DIAGNOSIS — I10 Essential (primary) hypertension: Secondary | ICD-10-CM | POA: Diagnosis not present

## 2022-10-08 DIAGNOSIS — Z299 Encounter for prophylactic measures, unspecified: Secondary | ICD-10-CM | POA: Diagnosis not present

## 2022-10-08 DIAGNOSIS — Z6837 Body mass index (BMI) 37.0-37.9, adult: Secondary | ICD-10-CM | POA: Diagnosis not present

## 2022-10-08 DIAGNOSIS — F419 Anxiety disorder, unspecified: Secondary | ICD-10-CM | POA: Diagnosis not present

## 2022-10-09 ENCOUNTER — Telehealth: Payer: Self-pay | Admitting: Cardiology

## 2022-10-09 MED ORDER — ENTRESTO 24-26 MG PO TABS: 1.0000 | ORAL_TABLET | Freq: Two times a day (BID) | ORAL | 1 refills | Status: DC

## 2022-10-09 NOTE — Telephone Encounter (Signed)
*  STAT* If patient is at the pharmacy, call can be transferred to refill team.   1. Which medications need to be refilled? (please list name of each medication and dose if known)   ENTRESTO 24-26 MG    2. Which pharmacy/location (including street and city if local pharmacy) is medication to be sent to? [facility] [address], [place] - [address]   3. Do they need a 30 day or 90 day supply? 90

## 2022-10-09 NOTE — Telephone Encounter (Signed)
Done

## 2022-10-15 ENCOUNTER — Ambulatory Visit (INDEPENDENT_AMBULATORY_CARE_PROVIDER_SITE_OTHER): Payer: PPO

## 2022-10-15 DIAGNOSIS — I442 Atrioventricular block, complete: Secondary | ICD-10-CM

## 2022-10-20 LAB — CUP PACEART REMOTE DEVICE CHECK
Battery Remaining Longevity: 3 mo
Battery Voltage: 2.84 V
Brady Statistic AP VP Percent: 0 %
Brady Statistic AP VS Percent: 0 %
Brady Statistic AS VP Percent: 96.67 %
Brady Statistic AS VS Percent: 3.33 %
Brady Statistic RA Percent Paced: 0 %
Brady Statistic RV Percent Paced: 97.32 %
Date Time Interrogation Session: 20240416124605
Implantable Lead Connection Status: 753985
Implantable Lead Connection Status: 753985
Implantable Lead Implant Date: 20161115
Implantable Lead Implant Date: 20161115
Implantable Lead Location: 753859
Implantable Lead Location: 753860
Implantable Lead Model: 5076
Implantable Lead Model: 5076
Implantable Pulse Generator Implant Date: 20161115
Lead Channel Impedance Value: 342 Ohm
Lead Channel Impedance Value: 342 Ohm
Lead Channel Impedance Value: 418 Ohm
Lead Channel Impedance Value: 475 Ohm
Lead Channel Pacing Threshold Amplitude: 0.5 V
Lead Channel Pacing Threshold Amplitude: 0.875 V
Lead Channel Pacing Threshold Pulse Width: 0.4 ms
Lead Channel Pacing Threshold Pulse Width: 0.4 ms
Lead Channel Sensing Intrinsic Amplitude: 1.5 mV
Lead Channel Sensing Intrinsic Amplitude: 1.5 mV
Lead Channel Sensing Intrinsic Amplitude: 24.25 mV
Lead Channel Sensing Intrinsic Amplitude: 24.25 mV
Lead Channel Setting Pacing Amplitude: 2.5 V
Lead Channel Setting Pacing Pulse Width: 0.4 ms
Lead Channel Setting Sensing Sensitivity: 2.8 mV
Zone Setting Status: 755011
Zone Setting Status: 755011

## 2022-10-27 NOTE — Progress Notes (Signed)
Remote pacemaker transmission.   

## 2022-10-27 NOTE — Addendum Note (Signed)
Addended by: Geralyn Flash D on: 10/27/2022 03:29 PM   Modules accepted: Level of Service

## 2022-10-28 DIAGNOSIS — E1140 Type 2 diabetes mellitus with diabetic neuropathy, unspecified: Secondary | ICD-10-CM | POA: Diagnosis not present

## 2022-10-28 DIAGNOSIS — M47816 Spondylosis without myelopathy or radiculopathy, lumbar region: Secondary | ICD-10-CM | POA: Diagnosis not present

## 2022-10-28 DIAGNOSIS — W010XXA Fall on same level from slipping, tripping and stumbling without subsequent striking against object, initial encounter: Secondary | ICD-10-CM | POA: Diagnosis not present

## 2022-10-28 DIAGNOSIS — J449 Chronic obstructive pulmonary disease, unspecified: Secondary | ICD-10-CM | POA: Diagnosis not present

## 2022-10-28 DIAGNOSIS — Z7951 Long term (current) use of inhaled steroids: Secondary | ICD-10-CM | POA: Diagnosis not present

## 2022-10-28 DIAGNOSIS — Z885 Allergy status to narcotic agent status: Secondary | ICD-10-CM | POA: Diagnosis not present

## 2022-10-28 DIAGNOSIS — G4730 Sleep apnea, unspecified: Secondary | ICD-10-CM | POA: Diagnosis not present

## 2022-10-28 DIAGNOSIS — R0781 Pleurodynia: Secondary | ICD-10-CM | POA: Diagnosis not present

## 2022-10-28 DIAGNOSIS — M545 Low back pain, unspecified: Secondary | ICD-10-CM | POA: Diagnosis not present

## 2022-10-28 DIAGNOSIS — M5136 Other intervertebral disc degeneration, lumbar region: Secondary | ICD-10-CM | POA: Diagnosis not present

## 2022-10-28 DIAGNOSIS — Z79899 Other long term (current) drug therapy: Secondary | ICD-10-CM | POA: Diagnosis not present

## 2022-10-28 DIAGNOSIS — Z043 Encounter for examination and observation following other accident: Secondary | ICD-10-CM | POA: Diagnosis not present

## 2022-10-28 DIAGNOSIS — Z794 Long term (current) use of insulin: Secondary | ICD-10-CM | POA: Diagnosis not present

## 2022-10-28 DIAGNOSIS — Z87891 Personal history of nicotine dependence: Secondary | ICD-10-CM | POA: Diagnosis not present

## 2022-10-28 DIAGNOSIS — I10 Essential (primary) hypertension: Secondary | ICD-10-CM | POA: Diagnosis not present

## 2022-10-28 DIAGNOSIS — M546 Pain in thoracic spine: Secondary | ICD-10-CM | POA: Diagnosis not present

## 2022-11-06 DIAGNOSIS — Z299 Encounter for prophylactic measures, unspecified: Secondary | ICD-10-CM | POA: Diagnosis not present

## 2022-11-06 DIAGNOSIS — R52 Pain, unspecified: Secondary | ICD-10-CM | POA: Diagnosis not present

## 2022-11-06 DIAGNOSIS — J449 Chronic obstructive pulmonary disease, unspecified: Secondary | ICD-10-CM | POA: Diagnosis not present

## 2022-11-06 DIAGNOSIS — F17210 Nicotine dependence, cigarettes, uncomplicated: Secondary | ICD-10-CM | POA: Diagnosis not present

## 2022-11-06 DIAGNOSIS — I10 Essential (primary) hypertension: Secondary | ICD-10-CM | POA: Diagnosis not present

## 2022-11-06 DIAGNOSIS — I4891 Unspecified atrial fibrillation: Secondary | ICD-10-CM | POA: Diagnosis not present

## 2022-11-06 DIAGNOSIS — I739 Peripheral vascular disease, unspecified: Secondary | ICD-10-CM | POA: Diagnosis not present

## 2022-11-06 DIAGNOSIS — I2510 Atherosclerotic heart disease of native coronary artery without angina pectoris: Secondary | ICD-10-CM | POA: Diagnosis not present

## 2022-11-17 DIAGNOSIS — I2510 Atherosclerotic heart disease of native coronary artery without angina pectoris: Secondary | ICD-10-CM | POA: Diagnosis not present

## 2022-11-17 DIAGNOSIS — J441 Chronic obstructive pulmonary disease with (acute) exacerbation: Secondary | ICD-10-CM | POA: Diagnosis not present

## 2022-11-17 DIAGNOSIS — I25119 Atherosclerotic heart disease of native coronary artery with unspecified angina pectoris: Secondary | ICD-10-CM | POA: Diagnosis not present

## 2022-11-17 DIAGNOSIS — Z299 Encounter for prophylactic measures, unspecified: Secondary | ICD-10-CM | POA: Diagnosis not present

## 2022-11-17 DIAGNOSIS — I700 Atherosclerosis of aorta: Secondary | ICD-10-CM | POA: Diagnosis not present

## 2022-11-17 DIAGNOSIS — J069 Acute upper respiratory infection, unspecified: Secondary | ICD-10-CM | POA: Diagnosis not present

## 2022-11-17 DIAGNOSIS — F17210 Nicotine dependence, cigarettes, uncomplicated: Secondary | ICD-10-CM | POA: Diagnosis not present

## 2022-11-20 NOTE — Progress Notes (Signed)
Remote pacemaker transmission.   

## 2022-11-23 ENCOUNTER — Ambulatory Visit: Payer: PPO | Admitting: Cardiology

## 2022-11-23 NOTE — Progress Notes (Deleted)
    Cardiology Office Note  Date: 11/23/2022   ID: Garrey, Siglin Jan 01, 1946, MRN [id number]  History of Present Illness: Dylan Tyler is a 77 y.o. male last seen in November 2023.  Medtronic pacemaker in place with followed by Dr. Nelly Laurence.  Device interrogation in April indicated 43% AF burden.  Physical Exam: VS:  There were no vitals taken for this visit., BMI There is no height or weight on file to calculate BMI.  Wt Readings from Last 3 Encounters:  09/07/22 234 lb 3.2 oz (106.2 kg)  06/12/22 243 lb 12.8 oz (110.6 kg)  05/18/22 244 lb 6.4 oz (110.9 kg)    General: Patient appears comfortable at rest. HEENT: Conjunctiva and lids normal, oropharynx clear with moist mucosa. Neck: Supple, no elevated JVP or carotid bruits, no thyromegaly. Lungs: Clear to auscultation, nonlabored breathing at rest. Cardiac: Regular rate and rhythm, no S3 or significant systolic murmur, no pericardial rub. Abdomen: Soft, nontender, no hepatomegaly, bowel sounds present, no guarding or rebound. Extremities: No pitting edema, distal pulses 2+. Skin: Warm and dry. Musculoskeletal: No kyphosis. Neuropsychiatric: Alert and oriented x3, affect grossly appropriate.  ECG:  An ECG dated 06/12/2022 was personally reviewed today and demonstrated:  Ventricular paced rhythm.  Labwork: 12/03/2021: TSH 1.98 02/05/2022: ALT 14; AST 16; BUN 17; Creatinine, Ser 1.75; Hemoglobin 16.6; Platelets 141; Potassium 4.5; Sodium 137  December 2023: Cholesterol 118, triglycerides 494, HDL 24, LDL 26, TSH 1.10 October 2022: Hemoglobin 16.1, platelets 155, potassium 4.1, BUN 35, creatinine 2.14, hemoglobin A1c 8.4%  Other Studies Reviewed Today:  Echocardiogram 12/09/2021:  1. Left ventricular ejection fraction, by estimation, is 50 to 55%. The  left ventricle has low normal function. Left ventricular endocardial  border not optimally defined to evaluate regional wall motion. There is  moderate left ventricular  hypertrophy.  Left ventricular diastolic parameters are indeterminate.   2. Right ventricular systolic function is normal. The right ventricular  size is normal. Tricuspid regurgitation signal is inadequate for assessing  PA pressure.   3. The mitral valve is normal in structure. Trivial mitral valve  regurgitation. No evidence of mitral stenosis.   4. The tricuspid valve is abnormal.   5. The aortic valve has an indeterminant number of cusps. Aortic valve  regurgitation is mild.   6. The inferior vena cava is normal in size with greater than 50%  respiratory variability, suggesting right atrial pressure of 3 mmHg.   Assessment and Plan:  1.  Persistent atrial fibrillation with CHA2DS2-VASc score of 4.  2.  History of nonischemic cardiomyopathy with improvement in LVEF, 50 to 55% range by echocardiogram in June 2023.  3.  CKD stage IIIb, creatinine 1.75 in August 2023.  4.  Symptomatic complete heart block status post Medtronic pacemaker, now following with Dr. Hyacinth Meeker.  Disposition:  Follow up {follow up:15908}  Signed, Dylan Tyler, M.D., F.A.C.C. [facility]

## 2022-11-24 ENCOUNTER — Ambulatory Visit (INDEPENDENT_AMBULATORY_CARE_PROVIDER_SITE_OTHER): Payer: PPO

## 2022-11-24 DIAGNOSIS — I442 Atrioventricular block, complete: Secondary | ICD-10-CM

## 2022-11-25 LAB — CUP PACEART REMOTE DEVICE CHECK
Battery Remaining Longevity: 2 mo
Battery Voltage: 2.83 V
Brady Statistic AP VP Percent: 0 %
Brady Statistic AP VS Percent: 0 %
Brady Statistic AS VP Percent: 97.54 %
Brady Statistic AS VS Percent: 2.46 %
Brady Statistic RA Percent Paced: 0 %
Brady Statistic RV Percent Paced: 97.96 %
Date Time Interrogation Session: 20240521125146
Implantable Lead Connection Status: 753985
Implantable Lead Connection Status: 753985
Implantable Lead Implant Date: 20161115
Implantable Lead Implant Date: 20161115
Implantable Lead Location: 753859
Implantable Lead Location: 753860
Implantable Lead Model: 5076
Implantable Lead Model: 5076
Implantable Pulse Generator Implant Date: 20161115
Lead Channel Impedance Value: 361 Ohm
Lead Channel Impedance Value: 380 Ohm
Lead Channel Impedance Value: 456 Ohm
Lead Channel Impedance Value: 513 Ohm
Lead Channel Pacing Threshold Amplitude: 0.5 V
Lead Channel Pacing Threshold Amplitude: 1 V
Lead Channel Pacing Threshold Pulse Width: 0.4 ms
Lead Channel Pacing Threshold Pulse Width: 0.4 ms
Lead Channel Sensing Intrinsic Amplitude: 10.75 mV
Lead Channel Sensing Intrinsic Amplitude: 10.75 mV
Lead Channel Sensing Intrinsic Amplitude: 6.125 mV
Lead Channel Sensing Intrinsic Amplitude: 6.125 mV
Lead Channel Setting Pacing Amplitude: 2.5 V
Lead Channel Setting Pacing Pulse Width: 0.4 ms
Lead Channel Setting Sensing Sensitivity: 2.8 mV
Zone Setting Status: 755011
Zone Setting Status: 755011

## 2022-12-07 DIAGNOSIS — F419 Anxiety disorder, unspecified: Secondary | ICD-10-CM | POA: Diagnosis not present

## 2022-12-07 DIAGNOSIS — M459 Ankylosing spondylitis of unspecified sites in spine: Secondary | ICD-10-CM | POA: Diagnosis not present

## 2022-12-07 DIAGNOSIS — J449 Chronic obstructive pulmonary disease, unspecified: Secondary | ICD-10-CM | POA: Diagnosis not present

## 2022-12-07 DIAGNOSIS — I739 Peripheral vascular disease, unspecified: Secondary | ICD-10-CM | POA: Diagnosis not present

## 2022-12-08 ENCOUNTER — Ambulatory Visit: Payer: PPO | Admitting: Nurse Practitioner

## 2022-12-08 ENCOUNTER — Encounter: Payer: Self-pay | Admitting: Nurse Practitioner

## 2022-12-08 VITALS — BP 103/64 | HR 89 | Ht 69.0 in | Wt 242.2 lb

## 2022-12-08 DIAGNOSIS — I10 Essential (primary) hypertension: Secondary | ICD-10-CM | POA: Diagnosis not present

## 2022-12-08 DIAGNOSIS — N1832 Chronic kidney disease, stage 3b: Secondary | ICD-10-CM

## 2022-12-08 DIAGNOSIS — F17200 Nicotine dependence, unspecified, uncomplicated: Secondary | ICD-10-CM

## 2022-12-08 DIAGNOSIS — Z794 Long term (current) use of insulin: Secondary | ICD-10-CM

## 2022-12-08 DIAGNOSIS — E782 Mixed hyperlipidemia: Secondary | ICD-10-CM | POA: Diagnosis not present

## 2022-12-08 DIAGNOSIS — E1122 Type 2 diabetes mellitus with diabetic chronic kidney disease: Secondary | ICD-10-CM | POA: Diagnosis not present

## 2022-12-08 LAB — POCT GLYCOSYLATED HEMOGLOBIN (HGB A1C): Hemoglobin A1C: 9.3 % — AB (ref 4.0–5.6)

## 2022-12-08 NOTE — Progress Notes (Signed)
12/08/2022            Endocrinology follow-up note   Subjective:    Patient ID: Dylan Tyler, male    DOB: [birth year].  he is being seen in follow-up  for management of currently uncontrolled, complicated type 2 diabetes, hyperlipidemia, hypertension.   PMD:  Ignatius Specking, MD.   Past Medical History:  Diagnosis Date   Ankylosing spondylitis (HCC)    Anxiety    Aortic root dilatation (HCC)    AR (aortic regurgitation)    Atrial fibrillation and flutter (HCC)    Bladder cancer (HCC)    Chronic pain    Followed at [facility]   COPD (chronic obstructive pulmonary disease) (HCC)    DDD (degenerative disc disease), lumbosacral    DDD (degenerative disc disease), thoracolumbar    Degenerative cervical disc    Depression    Diabetes mellitus type II    Diverticulitis    Essential hypertension    GERD (gastroesophageal reflux disease)    History of kidney stones    Hyperlipidemia    Migraine    Nonischemic cardiomyopathy (HCC)    Cardiolite 12/09 LVEF 55%, minor luminal irregularities at cardiac catheterization   OSA on CPAP    Rheumatoid arthritis (HCC)    Symptomatic bradycardia    Medtronic Advisa L dual-chamber pacemaker  05/2015 - Dr. Elberta Fortis   Past Surgical History:  Procedure Laterality Date   CARDIAC CATHETERIZATION  08/13/11   CARDIOVERSION N/A 07/02/2015   Procedure: CARDIOVERSION;  Surgeon: Thurmon Fair, MD;  Location: [facility];  Service: Cardiovascular;  Laterality: N/A;   CATARACT EXTRACTION W/PHACO Left 02/10/2019   Procedure: CATARACT EXTRACTION PHACO AND INTRAOCULAR LENS PLACEMENT (IOC);  Surgeon: Fabio Pierce, MD;  Location: [facility] ORS;  Service: Ophthalmology;  Laterality: Left;  CDE: 7.10   CATARACT EXTRACTION W/PHACO Right 02/27/2019   Procedure: CATARACT EXTRACTION PHACO AND INTRAOCULAR LENS PLACEMENT RIGHT EYE CDE=11.84;  Surgeon: Fabio Pierce, MD;  Location: [facility];  Service: Ophthalmology;  Laterality: Right;  right   CYSTOSCOPY WITH HOLMIUM LASER  LITHOTRIPSY  1990's   EP IMPLANTABLE DEVICE N/A 05/21/2015   MDT Advisa DR MRI pacemaker implanted by Dr Elberta Fortis   KNEE ARTHROSCOPY Right ~ 1991   LAPAROSCOPIC CHOLECYSTECTOMY  ~ 2008   TRANSURETHRAL RESECTION OF BLADDER TUMOR WITH GYRUS (TURBT-GYRUS)  1990's?   Social History   Socioeconomic History   Marital status: Married    Spouse name: Not on file   Number of children: Not on file   Years of education: Not on file   Highest education level: Not on file  Occupational History   Occupation: Disabled    Employer: DISABLED  Tobacco Use   Smoking status: Every Day    Packs/day: 0.50    Years: 50.00    Additional pack years: 0.00    Total pack years: 25.00    Types: E-cigarettes, Cigarettes    Start date: 02/26/1959   Smokeless tobacco: Former    Types: Chew   Tobacco comments:    05/21/2015 "quit chewing in 1992"  Vaping Use   Vaping Use: Every day   Substances: Nicotine, Flavoring  Substance and Sexual Activity   Alcohol use: Yes    Alcohol/week: 2.0 standard drinks of alcohol    Types: 2 Cans of beer per week   Drug use: Yes    Types: Cocaine    Comment: 05/21/2015 "quit in the early 2000"   Sexual activity: Not on file  Other Topics Concern  Not on file  Social History Narrative   Divorced   No regular exercise   Social Determinants of Health   Financial Resource Strain: Not on file  Food Insecurity: Not on file  Transportation Needs: Not on file  Physical Activity: Not on file  Stress: Not on file  Social Connections: Not on file   Outpatient Encounter Medications as of 12/08/2022  Medication Sig   acetaminophen (TYLENOL) 500 MG tablet Take 500 mg by mouth every 6 (six) hours as needed (pain).    albuterol (PROVENTIL) (2.5 MG/3ML) 0.083% nebulizer solution Take 2.5 mg by nebulization every 6 (six) hours as needed for wheezing.   albuterol (VENTOLIN HFA) 108 (90 Base) MCG/ACT inhaler SMARTSIG:1-2 Puff(s) Via Inhaler Every 4-6 Hours PRN   Alcohol Swabs 70  % PADS Use as directed   ALPRAZolam (XANAX) 0.5 MG tablet Take 0.5 mg by mouth 2 (two) times daily as needed.   apixaban (ELIQUIS) 5 MG TABS tablet Take 1 tablet (5 mg total) by mouth 2 (two) times daily.   BD PEN NEEDLE NANO U/F 32G X 4 MM MISC USE AS DIRECTED FOUR TIMES DAILY   calcitRIOL (ROCALTROL) 0.25 MCG capsule Take 0.25 mcg by mouth 3 (three) times a week.   Cholecalciferol (VITAMIN D-3) 1000 UNITS CAPS Take 1,000 Units by mouth 2 (two) times daily.    cholestyramine light (PREVALITE) 4 g packet SMARTSIG:1 Packet(s) By Mouth Twice Daily PRN   citalopram (CELEXA) 40 MG tablet Take 20-40 mg by mouth daily.    Continuous Blood Gluc Receiver (FREESTYLE LIBRE 2 READER) DEVI USE WITH SENSORS TO MONITOR BLOOD SUGAR.   Continuous Blood Gluc Sensor (FREESTYLE LIBRE 2 SENSOR) MISC APPLY ONE SENSOR EVERY 14 DAYS AS DIRECTED   furosemide (LASIX) 40 MG tablet TAKE TWO (2) TABLETS BY MOUTH THREE TIMES WEEKLY AND ONE TABLET FOUR TIMES WEEKLY.   gabapentin (NEURONTIN) 400 MG capsule Take 400 mg by mouth 3 (three) times daily.    IBU 800 MG tablet Take 800 mg by mouth every 6 (six) hours as needed.   insulin glargine (LANTUS SOLOSTAR) 100 UNIT/ML Solostar Pen Patient is to inject 70 units at supper.   insulin lispro (HUMALOG KWIKPEN) 100 UNIT/ML KwikPen Inject 20-26 Units into the skin 3 (three) times daily.   Insulin Pen Needle (B-D ULTRAFINE III SHORT PEN) 31G X 8 MM MISC Use to inject insulin 4 x daily   ipratropium-albuterol (DUONEB) 0.5-2.5 (3) MG/3ML SOLN Take 3 mLs by nebulization every 4 (four) hours as needed (for shortness of breath).   Lancets (FREESTYLE) lancets USE TO CHECK BLOOD SUGAR FOUR TIMES DAILY.   magnesium oxide (MAG-OX) 400 (240 Mg) MG tablet Take 1 tablet by mouth daily.   metoCLOPramide (REGLAN) 5 MG tablet Take 5 mg by mouth every 6 (six) hours as needed for nausea or vomiting.   metoprolol succinate (TOPROL-XL) 50 MG 24 hr tablet Take 1 tablet (50 mg total) by mouth daily.    nitroGLYCERIN (NITROSTAT) 0.4 MG SL tablet DISSOLVE ONE TABLET UNDER TONGUE EVERY 5 MINUTES UP TO 3 DOSES AS NEEDED FOR CHEST PAIN (IF NO RELIEF AFTER 2ND DOSE GO TO ED OR CALL 911)   NON FORMULARY 1 each by Other route See admin instructions. CPAP - Uses nightly   sacubitril-valsartan (ENTRESTO) 24-26 MG Take 1 tablet by mouth 2 (two) times daily.   simvastatin (ZOCOR) 40 MG tablet TAKE ONE TABLET BY MOUTH AT BEDTIME   traMADol (ULTRAM) 50 MG tablet Take 1 tablet by mouth  daily as needed (pain).    TRELEGY ELLIPTA 100-62.5-25 MCG/INH AEPB Inhale 1 puff into the lungs daily.   triamcinolone cream (KENALOG) 0.1 % Apply 1 application topically 2 (two) times daily as needed.   vitamin B-12 (CYANOCOBALAMIN) 1000 MCG tablet Take 1,000 mcg by mouth 2 (two) times daily.   VOLTAREN 1 % GEL Apply 2 g topically 4 (four) times daily as needed (for pain).    No facility-administered encounter medications on file as of 12/08/2022.    ALLERGIES: No Known Allergies  VACCINATION STATUS:  There is no immunization history on file for this patient.  Diabetes He presents for his follow-up diabetic visit. He has type 2 diabetes mellitus. Onset time: He was diagnosed at approximate age of 14 years. His disease course has been worsening. There are no hypoglycemic associated symptoms. Pertinent negatives for hypoglycemia include no confusion, hunger, pallor or seizures. Associated symptoms include foot paresthesias. Pertinent negatives for diabetes include no fatigue, no polydipsia, no polyphagia, no polyuria, no weakness and no weight loss (intentional). There are no hypoglycemic complications. Symptoms are stable. Diabetic complications include heart disease, nephropathy and peripheral neuropathy. Risk factors for coronary artery disease include dyslipidemia, diabetes mellitus, hypertension, male sex, sedentary lifestyle, tobacco exposure, family history and obesity. Current diabetic treatment includes intensive  insulin program. He is compliant with treatment some of the time (falls asleep early and forgets to take Lantus). His weight is fluctuating minimally. He is following a generally unhealthy diet. When asked about meal planning, he reported none. He has not had a previous visit with a dietitian. He never [facility] with cane due to bilateral knee arthritis) participates in exercise. His home blood glucose trend is fluctuating dramatically. His overall blood glucose range is >200 mg/dl. (He presents today, accompanied by his wife, with his CGM showing gross hyperglycemia overall.  His POCT A1c today is 9.3%, increasing from last visit of 8.4%.  Analysis of his CGM shows TIR 16%, TAR 87%, TBR 0% with a GMI of 9.6%.  He has missed many doses of his Lantus as he tends to fall asleep shortly after supper.) An ACE inhibitor/angiotensin II receptor blocker is being taken. He sees a podiatrist.Eye exam is current.  Hyperlipidemia This is a chronic problem. The current episode started more than 1 year ago. The problem is controlled. Recent lipid tests were reviewed and are normal. Exacerbating diseases include chronic renal disease, diabetes and obesity. Factors aggravating his hyperlipidemia include smoking, fatty foods and beta blockers. Pertinent negatives include no myalgias or shortness of breath. Current antihyperlipidemic treatment includes statins. The current treatment provides mild improvement of lipids. Compliance problems include adherence to diet and adherence to exercise.  Risk factors for coronary artery disease include dyslipidemia, diabetes mellitus, hypertension, a sedentary lifestyle, male sex and obesity.  Hypertension This is a chronic problem. The current episode started more than 1 year ago. The problem has been resolved since onset. The problem is controlled. Pertinent negatives include no shortness of breath. There are no associated agents to hypertension. Risk factors for coronary artery disease  include diabetes mellitus, dyslipidemia, family history, male gender, obesity, sedentary lifestyle and smoking/tobacco exposure. Past treatments include beta blockers and diuretics. The current treatment provides significant improvement. Compliance problems include diet and exercise.  Hypertensive end-organ damage includes kidney disease and CAD/MI. Identifiable causes of hypertension include chronic renal disease.    Review of systems  Constitutional: + minimally fluctuating body weight,  current  Body mass index is 35.77 kg/m. , + fatigue,  no subjective hyperthermia, no subjective hypothermia Eyes: no blurry vision, no xerophthalmia ENT: no sore throat, no nodules palpated in throat, no dysphagia/odynophagia, no hoarseness Cardiovascular: no Chest Pain, no Shortness of Breath, no palpitations, no leg swelling Respiratory: chronic cough (smoker), intermittent shortness of breath Gastrointestinal: no Nausea/Vomiting/Diarrhea Musculoskeletal: bilateral knee pain (under evaluation for bilateral TKR)  Skin: no rashes, no hyperemia, multiple nonhealing wounds to BLE Neurological: no tremors, no numbness, no tingling, + intermittent dizziness Psychiatric: no depression, no anxiety   Objective:    BP 103/64 (BP Location: Right Arm, Patient Position: Sitting, Cuff Size: Large)   Pulse 89   Ht 5\' 9"  (1.753 m)   Wt 242 lb 3.2 oz (109.9 kg)   BMI 35.77 kg/m   Wt Readings from Last 3 Encounters:  12/08/22 242 lb 3.2 oz (109.9 kg)  09/07/22 234 lb 3.2 oz (106.2 kg)  06/12/22 243 lb 12.8 oz (110.6 kg)    BP Readings from Last 3 Encounters:  12/08/22 103/64  09/07/22 (!) 142/88  06/12/22 116/60    Physical Exam- Limited  Constitutional:  Body mass index is 35.77 kg/m. , not in acute distress, normal state of mind Eyes:  EOMI, no exophthalmos Musculoskeletal: no gross deformities, strength intact in all four extremities Skin:  no rashes, no hyperemia Neurological: no tremor with  outstretched hands   Diabetic Foot Exam - Simple   No data filed      Lipid Panel     Component Value Date/Time   CHOL 101 12/03/2021 0000   TRIG 113 12/03/2021 0000   HDL 32 (A) 12/03/2021 0000   LDLCALC 46 12/03/2021 0000   Recent Results (from the past 2160 hour(s))  CUP PACEART REMOTE DEVICE CHECK     Status: None   Collection Time: 09/16/22  9:09 AM  Result Value Ref Range   Date Time Interrogation Session [id number]    Pulse Generator Manufacturer MERM    Pulse Gen Model A2DR01 Advisa DR MRI    Pulse Gen Serial Number [id number]    Clinic Name [facility]    Implantable Pulse Generator Type Implantable Pulse Generator    Implantable Pulse Generator Implant Date 62130865    Implantable Lead Manufacturer MERM    Implantable Lead Model 5076 CapSureFix Novus    Implantable Lead Serial Number [id number]    Implantable Lead Implant Date 84132440    Implantable Lead Location Detail 1 UNKNOWN    Implantable Lead Location [id number]    Implantable Lead Connection Status [id number]    Implantable Lead Manufacturer MERM    Implantable Lead Model 5076 CapSureFix Novus    Implantable Lead Serial Number [id number]    Implantable Lead Implant Date 40347425    Implantable Lead Location Detail 1 UNKNOWN    Implantable Lead Location [id number]    Implantable Lead Connection Status [id number]    Lead Channel Setting Sensing Sensitivity 2.8 mV   Lead Channel Setting Pacing Pulse Width 0.4 ms   Lead Channel Setting Pacing Amplitude 2.5 V   Zone Setting Status 755011    Zone Setting Status [phone]    Lead Channel Impedance Value 494 ohm   Lead Channel Impedance Value 361 ohm   Lead Channel Sensing Intrinsic Amplitude 2.75 mV   Lead Channel Sensing Intrinsic Amplitude 2.75 mV   Lead Channel Pacing Threshold Amplitude 0.5 V   Lead Channel Pacing Threshold Pulse Width 0.4 ms   Lead Channel Impedance Value 456 ohm   Lead Channel Impedance Value 361 ohm   Lead  Channel Sensing Intrinsic  Amplitude 26 mV   Lead Channel Sensing Intrinsic Amplitude 26 mV   Lead Channel Pacing Threshold Amplitude 0.875 V   Lead Channel Pacing Threshold Pulse Width 0.4 ms   Battery Status OK    Battery Remaining Longevity 3 mo   Battery Voltage 2.85 V   Brady Statistic RA Percent Paced 0 %   Brady Statistic RV Percent Paced 97.03 %   Brady Statistic AP VP Percent 0 %   Brady Statistic AS VP Percent 95.84 %   Brady Statistic AP VS Percent 0 %   Brady Statistic AS VS Percent 4.16 %  CUP PACEART REMOTE DEVICE CHECK     Status: None   Collection Time: 10/20/22 12:46 PM  Result Value Ref Range   Date Time Interrogation Session [id number]    Pulse Generator Manufacturer MERM    Pulse Gen Model A2DR01 Advisa DR MRI    Pulse Gen Serial Number [id number]    Clinic Name [facility]    Implantable Pulse Generator Type Implantable Pulse Generator    Implantable Pulse Generator Implant Date 19147829    Implantable Lead Manufacturer MERM    Implantable Lead Model 5076 CapSureFix Novus    Implantable Lead Serial Number [id number]    Implantable Lead Implant Date 84696295    Implantable Lead Location Detail 1 UNKNOWN    Implantable Lead Location [id number]    Implantable Lead Connection Status [id number]    Implantable Lead Manufacturer MERM    Implantable Lead Model 5076 CapSureFix Novus    Implantable Lead Serial Number [id number]    Implantable Lead Implant Date 02725366    Implantable Lead Location Detail 1 UNKNOWN    Implantable Lead Location [id number]    Implantable Lead Connection Status [id number]    Lead Channel Setting Sensing Sensitivity 2.8 mV   Lead Channel Setting Pacing Pulse Width 0.4 ms   Lead Channel Setting Pacing Amplitude 2.5 V   Zone Setting Status 755011    Zone Setting Status [phone]    Lead Channel Impedance Value 475 ohm   Lead Channel Impedance Value 342 ohm   Lead Channel Sensing Intrinsic Amplitude 1.5 mV   Lead Channel Sensing Intrinsic Amplitude 1.5 mV   Lead Channel  Pacing Threshold Amplitude 0.5 V   Lead Channel Pacing Threshold Pulse Width 0.4 ms   Lead Channel Impedance Value 418 ohm   Lead Channel Impedance Value 342 ohm   Lead Channel Sensing Intrinsic Amplitude 24.25 mV   Lead Channel Sensing Intrinsic Amplitude 24.25 mV   Lead Channel Pacing Threshold Amplitude 0.875 V   Lead Channel Pacing Threshold Pulse Width 0.4 ms   Battery Status OK    Battery Remaining Longevity 3 mo   Battery Voltage 2.84 V   Brady Statistic RA Percent Paced 0 %   Brady Statistic RV Percent Paced 97.32 %   Brady Statistic AP VP Percent 0 %   Brady Statistic AS VP Percent 96.67 %   Brady Statistic AP VS Percent 0 %   Brady Statistic AS VS Percent 3.33 %  CUP PACEART REMOTE DEVICE CHECK     Status: None   Collection Time: 11/24/22 12:51 PM  Result Value Ref Range   Date Time Interrogation Session [phone]    Pulse Generator Manufacturer MERM    Pulse Gen Model A2DR01 Advisa DR MRI    Pulse Gen Serial Number [id number]    Clinic Name [facility]    Implantable Pulse Generator Type Implantable Pulse Generator  Implantable Pulse Generator Implant Date 16109604    Implantable Lead Manufacturer MERM    Implantable Lead Model 5076 CapSureFix Novus    Implantable Lead Serial Number [id number]    Implantable Lead Implant Date 54098119    Implantable Lead Location Detail 1 UNKNOWN    Implantable Lead Location [id number]    Implantable Lead Connection Status [id number]    Implantable Lead Manufacturer [place]    Implantable Lead Model 5076 CapSureFix Novus    Implantable Lead Serial Number [id number]    Implantable Lead Implant Date 30865784    Implantable Lead Location Detail 1 UNKNOWN    Implantable Lead Location [id number]    Implantable Lead Connection Status 696295    Lead Channel Setting Sensing Sensitivity 2.8 mV   Lead Channel Setting Pacing Pulse Width 0.4 ms   Lead Channel Setting Pacing Amplitude 2.5 V   Zone Setting Status 755011    Zone Setting Status  [phone]    Lead Channel Impedance Value 513 ohm   Lead Channel Impedance Value 380 ohm   Lead Channel Sensing Intrinsic Amplitude 6.125 mV   Lead Channel Sensing Intrinsic Amplitude 6.125 mV   Lead Channel Pacing Threshold Amplitude 0.5 V   Lead Channel Pacing Threshold Pulse Width 0.4 ms   Lead Channel Impedance Value 456 ohm   Lead Channel Impedance Value 361 ohm   Lead Channel Sensing Intrinsic Amplitude 10.75 mV   Lead Channel Sensing Intrinsic Amplitude 10.75 mV   Lead Channel Pacing Threshold Amplitude 1 V   Lead Channel Pacing Threshold Pulse Width 0.4 ms   Battery Status OK    Battery Remaining Longevity 2 mo   Battery Voltage 2.83 V   Brady Statistic RA Percent Paced 0 %   Brady Statistic RV Percent Paced 97.96 %   Brady Statistic AP VP Percent 0 %   Brady Statistic AS VP Percent 97.54 %   Brady Statistic AP VS Percent 0 %   Brady Statistic AS VS Percent 2.46 %  HgB A1c     Status: Abnormal   Collection Time: 12/08/22  2:00 PM  Result Value Ref Range   Hemoglobin A1C 9.3 (A) 4.0 - 5.6 %   HbA1c POC (<> result, manual entry)     HbA1c, POC (prediabetic range)     HbA1c, POC (controlled diabetic range)        Latest Ref Rng & Units 02/05/2022    2:08 PM 12/03/2021   12:00 AM 06/25/2020   12:00 AM  CMP  Glucose 70 - 99 mg/dL 440     BUN 8 - 23 mg/dL 17  13  25       Creatinine 0.61 - 1.24 mg/dL 1.02  1.7  1.6      Sodium 135 - 145 mmol/L 137  137    Potassium 3.5 - 5.1 mmol/L 4.5  4.7    Chloride 98 - 111 mmol/L 102  102    CO2 22 - 32 mmol/L 27  28    Calcium 8.9 - 10.3 mg/dL 8.9  8.8  9.2      Total Protein 6.5 - 8.1 g/dL 6.8     Total Bilirubin 0.3 - 1.2 mg/dL 1.2     Alkaline Phos 38 - 126 U/L 63  74    AST 15 - 41 U/L 16  18    ALT 0 - 44 U/L 14  20       This result is from an external source.     Assessment &  Plan:   1) Type 2 diabetes mellitus with stage 3 chronic kidney disease, without long-term current use of insulin (HCC)  - Patient has currently  uncontrolled symptomatic type 2 DM since 77 years of age.  He presents today, accompanied by his wife, with his CGM showing gross hyperglycemia overall.  His POCT A1c today is 9.3%, increasing from last visit of 8.4%.  Analysis of his CGM shows TIR 16%, TAR 87%, TBR 0% with a GMI of 9.6%.  He has missed many doses of his Lantus as he tends to fall asleep shortly after supper.  -his diabetes is complicated by stage 3 renal insufficiency (improving), obesity/sedentary life, neuropathy, cardiomyopathy, chronic heavy smoking, inadequate insurance and [place] remains at a high risk for more acute and chronic complications which include CAD, CVA, CKD, retinopathy, and neuropathy. These are all discussed in detail with the patient.  - Nutritional counseling repeated at each appointment due to patients tendency to fall back in to old habits.  - The patient admits there is a room for improvement in their diet and drink choices. -  Suggestion is made for the patient to avoid simple carbohydrates from their diet including Cakes, Sweet Desserts / Pastries, Ice Cream, Soda (diet and regular), Sweet Tea, Candies, Chips, Cookies, Sweet Pastries, Store Bought Juices, Alcohol in Excess of 1-2 drinks a day, Artificial Sweeteners, Coffee Creamer, and "Sugar-free" Products. This will help patient to have stable blood glucose profile and potentially avoid unintended weight gain.   - I encouraged the patient to switch to unprocessed or minimally processed complex starch and increased protein intake (animal or plant source), fruits, and vegetables.   - Patient is advised to stick to a routine mealtimes to eat 3 meals a day and avoid unnecessary snacks (to snack only to correct hypoglycemia).  - I have approached him with the following individualized plan to manage diabetes and patient agrees:   - Based on his glucose profile, he will continue to need intensive treatment with higher dose of basal/bolus insulin in  order for him to achieve control of diabetes to target.    -He is advised to continue his Lantus at 70 units but to start taking it at supper (wife agreed to help remind him) so he can remember it consistently and continue his Novolog 20-26 units TID with meals if glucose is above 90 and he is eating (Specific instructions on how to titrate insulin dosage based on glucose readings given to patient in writing).    -He is encouraged to continue using his CGM to monitor blood glucose 4 times per day, before meals and at bedtime and report to the clinic if blood glucose levels are less than 70 or greater than 200 for 3 tests in a row.  - He is not a candidate for metformin therapy due to CKD.  - He is not a candidate for incretin therapy (such as Ozempic) due to increased risk of pancreatitis r/t hypertriglyceridemia and heavy smoking history.  He asked again about starting this medication today.  I advised him to quit smoking and I would consider it, but right now due to high triglycerides and smoking history, he would be high risk for pancreatitis.  2) Lipids/HPL:  His recent lipid panel from 06/26/22 shows controlled LDL of 26 and significantly elevated triglycerides of 494.  He is advised to continue Cholestyramine 4 g TID with meals and Simvastatin 40 mg po daily at bedtime.  Side effects and precautions discussed with him.  He is advised to avoid fried foods and butter.  3) Hypertension His blood pressure is controlled to target for his age.  He is advised to continue Coreg 6.25 mg po twice daily, Lasix 40 mg po every other day (alternating with 20 mg), and Entresto 24-26 mg po twice daily.   4) Chronic Care/Health Maintenance: -he is on ACEI/ARB and Statin medications and  is encouraged to continue to follow up with Ophthalmology, Dentist,  Podiatrist at least yearly or according to recommendations. - I have recommended yearly flu vaccine and pneumonia vaccination at least every 5 years; and   sleep for at least 7 hours a day.  The patient was counseled on the dangers of tobacco use, and was advised to quit.  Reviewed strategies to maximize success, including removing cigarettes and smoking materials from environment.  5) Weight management:  His Body mass index is 35.77 kg/m. He is a candidate for modest weight loss.  Exercise regimen and carbs restrictions were detailed with him.   - I advised patient to maintain close follow up with Ignatius Specking, MD for primary care needs.      I spent  32  minutes in the care of the patient today including review of labs from CMP, Lipids, Thyroid Function, Hematology (current and previous including abstractions from other facilities); face-to-face time discussing  his blood glucose readings/logs, discussing hypoglycemia and hyperglycemia episodes and symptoms, medications doses, his options of short and long term treatment based on the latest standards of care / guidelines;  discussion about incorporating lifestyle medicine;  and documenting the encounter. Risk reduction counseling performed per USPSTF guidelines to reduce obesity and cardiovascular risk factors.     Please refer to Patient Instructions for Blood Glucose Monitoring and Insulin/Medications Dosing Guide"  in media tab for additional information. Please  also refer to " Patient Self Inventory" in the Media  tab for reviewed elements of pertinent patient history.  Fredrik Cove participated in the discussions, expressed understanding, and voiced agreement with the above plans.  All questions were answered to his satisfaction. he is encouraged to contact clinic should he have any questions or concerns prior to his return visit.   Follow up plan: - Return in about 3 months (around 03/10/2023) for Diabetes F/U with A1c in office, No previsit labs, Bring meter and logs.    Ronny Bacon, [facility] [address] [place], [place] [address] Phone:  [phone] Fax: [phone]   12/08/2022, 2:14 PM

## 2022-12-15 ENCOUNTER — Other Ambulatory Visit: Payer: Self-pay | Admitting: *Deleted

## 2022-12-15 DIAGNOSIS — E1122 Type 2 diabetes mellitus with diabetic chronic kidney disease: Secondary | ICD-10-CM

## 2022-12-15 MED ORDER — FREESTYLE LIBRE 2 SENSOR MISC: 1.0000 | 2 refills | Status: DC

## 2022-12-18 DIAGNOSIS — Z1339 Encounter for screening examination for other mental health and behavioral disorders: Secondary | ICD-10-CM | POA: Diagnosis not present

## 2022-12-18 DIAGNOSIS — E1165 Type 2 diabetes mellitus with hyperglycemia: Secondary | ICD-10-CM | POA: Diagnosis not present

## 2022-12-18 DIAGNOSIS — I739 Peripheral vascular disease, unspecified: Secondary | ICD-10-CM | POA: Diagnosis not present

## 2022-12-18 DIAGNOSIS — R52 Pain, unspecified: Secondary | ICD-10-CM | POA: Diagnosis not present

## 2022-12-18 DIAGNOSIS — Z0000 Encounter for general adult medical examination without abnormal findings: Secondary | ICD-10-CM | POA: Diagnosis not present

## 2022-12-18 DIAGNOSIS — Z6836 Body mass index (BMI) 36.0-36.9, adult: Secondary | ICD-10-CM | POA: Diagnosis not present

## 2022-12-18 DIAGNOSIS — Z1331 Encounter for screening for depression: Secondary | ICD-10-CM | POA: Diagnosis not present

## 2022-12-18 DIAGNOSIS — I10 Essential (primary) hypertension: Secondary | ICD-10-CM | POA: Diagnosis not present

## 2022-12-18 DIAGNOSIS — I25119 Atherosclerotic heart disease of native coronary artery with unspecified angina pectoris: Secondary | ICD-10-CM | POA: Diagnosis not present

## 2022-12-18 DIAGNOSIS — Z299 Encounter for prophylactic measures, unspecified: Secondary | ICD-10-CM | POA: Diagnosis not present

## 2022-12-18 DIAGNOSIS — Z7189 Other specified counseling: Secondary | ICD-10-CM | POA: Diagnosis not present

## 2022-12-18 NOTE — Progress Notes (Signed)
Remote pacemaker transmission.   

## 2022-12-18 NOTE — Addendum Note (Signed)
Addended by: Geralyn Flash D on: 12/18/2022 10:51 AM   Modules accepted: Level of Service

## 2022-12-24 ENCOUNTER — Ambulatory Visit (INDEPENDENT_AMBULATORY_CARE_PROVIDER_SITE_OTHER): Payer: PPO

## 2022-12-24 DIAGNOSIS — I442 Atrioventricular block, complete: Secondary | ICD-10-CM

## 2022-12-25 LAB — CUP PACEART REMOTE DEVICE CHECK
Battery Remaining Longevity: 1 mo
Battery Voltage: 2.83 V
Brady Statistic AP VP Percent: 0 %
Brady Statistic AP VS Percent: 0 %
Brady Statistic AS VP Percent: 97.43 %
Brady Statistic AS VS Percent: 2.57 %
Brady Statistic RA Percent Paced: 0 %
Brady Statistic RV Percent Paced: 97.78 %
Date Time Interrogation Session: 20240620155548
Implantable Lead Connection Status: 753985
Implantable Lead Connection Status: 753985
Implantable Lead Implant Date: 20161115
Implantable Lead Implant Date: 20161115
Implantable Lead Location: 753859
Implantable Lead Location: 753860
Implantable Lead Model: 5076
Implantable Lead Model: 5076
Implantable Pulse Generator Implant Date: 20161115
Lead Channel Impedance Value: 361 Ohm
Lead Channel Impedance Value: 361 Ohm
Lead Channel Impedance Value: 456 Ohm
Lead Channel Impedance Value: 494 Ohm
Lead Channel Pacing Threshold Amplitude: 0.5 V
Lead Channel Pacing Threshold Amplitude: 0.75 V
Lead Channel Pacing Threshold Pulse Width: 0.4 ms
Lead Channel Pacing Threshold Pulse Width: 0.4 ms
Lead Channel Sensing Intrinsic Amplitude: 27.25 mV
Lead Channel Sensing Intrinsic Amplitude: 27.25 mV
Lead Channel Sensing Intrinsic Amplitude: 4.625 mV
Lead Channel Sensing Intrinsic Amplitude: 4.625 mV
Lead Channel Setting Pacing Amplitude: 2.5 V
Lead Channel Setting Pacing Pulse Width: 0.4 ms
Lead Channel Setting Sensing Sensitivity: 2.8 mV
Zone Setting Status: 755011
Zone Setting Status: 755011

## 2023-01-05 ENCOUNTER — Telehealth: Payer: Self-pay | Admitting: Cardiology

## 2023-01-05 ENCOUNTER — Other Ambulatory Visit: Payer: Self-pay | Admitting: Cardiology

## 2023-01-05 MED ORDER — SIMVASTATIN 40 MG PO TABS: 40.0000 mg | ORAL_TABLET | Freq: Every day | ORAL | 5 refills | Status: DC

## 2023-01-05 NOTE — Telephone Encounter (Signed)
*  STAT* If patient is at the pharmacy, call can be transferred to refill team.   1. Which medications need to be refilled? (please list name of each medication and dose if known) simvastatin (ZOCOR) 40 MG tablet   2. Which pharmacy/location (including street and city if local pharmacy) is medication to be sent to? [facility] [address], [place] - [address]   3. Do they need a 30 day or 90 day supply? 30

## 2023-01-05 NOTE — Telephone Encounter (Signed)
Medication refilled

## 2023-01-13 NOTE — Progress Notes (Signed)
Remote pacemaker transmission.   

## 2023-01-13 NOTE — Addendum Note (Signed)
Addended by: Elease Etienne A on: 01/13/2023 03:41 PM   Modules accepted: Level of Service

## 2023-01-18 ENCOUNTER — Ambulatory Visit (INDEPENDENT_AMBULATORY_CARE_PROVIDER_SITE_OTHER): Payer: PPO

## 2023-01-18 DIAGNOSIS — I442 Atrioventricular block, complete: Secondary | ICD-10-CM

## 2023-01-26 ENCOUNTER — Other Ambulatory Visit: Payer: Self-pay

## 2023-01-26 ENCOUNTER — Telehealth: Payer: Self-pay

## 2023-01-26 DIAGNOSIS — I442 Atrioventricular block, complete: Secondary | ICD-10-CM

## 2023-01-26 LAB — CUP PACEART REMOTE DEVICE CHECK
Battery Remaining Longevity: 1 mo
Battery Voltage: 2.83 V
Brady Statistic AP VP Percent: 0 %
Brady Statistic AP VS Percent: 0 %
Brady Statistic AS VP Percent: 99.02 %
Brady Statistic AS VS Percent: 0.98 %
Brady Statistic RA Percent Paced: 0 %
Brady Statistic RV Percent Paced: 99.25 %
Date Time Interrogation Session: 20240719165003
Implantable Lead Connection Status: 753985
Implantable Lead Connection Status: 753985
Implantable Lead Implant Date: 20161115
Implantable Lead Implant Date: 20161115
Implantable Lead Location: 753859
Implantable Lead Location: 753860
Implantable Lead Model: 5076
Implantable Lead Model: 5076
Implantable Pulse Generator Implant Date: 20161115
Lead Channel Impedance Value: 342 Ohm
Lead Channel Impedance Value: 361 Ohm
Lead Channel Impedance Value: 456 Ohm
Lead Channel Impedance Value: 475 Ohm
Lead Channel Pacing Threshold Amplitude: 0.5 V
Lead Channel Pacing Threshold Amplitude: 0.875 V
Lead Channel Pacing Threshold Pulse Width: 0.4 ms
Lead Channel Pacing Threshold Pulse Width: 0.4 ms
Lead Channel Sensing Intrinsic Amplitude: 23.625 mV
Lead Channel Sensing Intrinsic Amplitude: 23.625 mV
Lead Channel Sensing Intrinsic Amplitude: 3.125 mV
Lead Channel Sensing Intrinsic Amplitude: 3.125 mV
Lead Channel Setting Pacing Amplitude: 2.5 V
Lead Channel Setting Pacing Pulse Width: 0.4 ms
Lead Channel Setting Sensing Sensitivity: 2.8 mV
Zone Setting Status: 755011
Zone Setting Status: 755011

## 2023-01-26 NOTE — Telephone Encounter (Signed)
Noted  

## 2023-01-26 NOTE — Telephone Encounter (Signed)
Pt is scheduled for a PPM Gen Change with Dr. Nelly Laurence on 02/18/23 at 2:30 PM.   He will have labs done at [facility] the week before the procedure. Orders are in.  He is scheduled to see Dr. Nelly Laurence in [place] on 8/2 and I told him they would print out his Instruction letter and give him surgical scrub and go over the details of the letter with him.

## 2023-01-26 NOTE — Telephone Encounter (Signed)
Pacemaker reached RRT 01/22/23. Has apt 02/05/23 in office. I also placed a note in the apt note tab.

## 2023-01-27 DIAGNOSIS — R809 Proteinuria, unspecified: Secondary | ICD-10-CM | POA: Diagnosis not present

## 2023-01-27 DIAGNOSIS — D631 Anemia in chronic kidney disease: Secondary | ICD-10-CM | POA: Diagnosis not present

## 2023-01-27 DIAGNOSIS — N183 Chronic kidney disease, stage 3 unspecified: Secondary | ICD-10-CM | POA: Diagnosis not present

## 2023-01-27 DIAGNOSIS — N189 Chronic kidney disease, unspecified: Secondary | ICD-10-CM | POA: Diagnosis not present

## 2023-01-28 DIAGNOSIS — D472 Monoclonal gammopathy: Secondary | ICD-10-CM | POA: Diagnosis not present

## 2023-01-28 DIAGNOSIS — R809 Proteinuria, unspecified: Secondary | ICD-10-CM | POA: Diagnosis not present

## 2023-01-28 DIAGNOSIS — N1832 Chronic kidney disease, stage 3b: Secondary | ICD-10-CM | POA: Diagnosis not present

## 2023-01-28 DIAGNOSIS — E1122 Type 2 diabetes mellitus with diabetic chronic kidney disease: Secondary | ICD-10-CM | POA: Diagnosis not present

## 2023-02-03 NOTE — Progress Notes (Signed)
Remote pacemaker transmission.   

## 2023-02-03 NOTE — Addendum Note (Signed)
Addended by: Geralyn Flash D on: 02/03/2023 10:23 AM   Modules accepted: Level of Service

## 2023-02-05 ENCOUNTER — Other Ambulatory Visit: Payer: Self-pay

## 2023-02-05 ENCOUNTER — Encounter: Payer: Self-pay | Admitting: Cardiovascular Disease

## 2023-02-05 ENCOUNTER — Ambulatory Visit: Payer: PPO | Attending: Internal Medicine | Admitting: Cardiovascular Disease

## 2023-02-05 VITALS — BP 138/88 | HR 70 | Ht 69.0 in | Wt 245.2 lb

## 2023-02-05 DIAGNOSIS — I442 Atrioventricular block, complete: Secondary | ICD-10-CM

## 2023-02-05 LAB — CUP PACEART INCLINIC DEVICE CHECK
Battery Remaining Longevity: 1 mo
Battery Voltage: 2.82 V
Brady Statistic AP VP Percent: 0 %
Brady Statistic AP VS Percent: 0 %
Brady Statistic AS VP Percent: 95.15 %
Brady Statistic AS VS Percent: 4.85 %
Brady Statistic RA Percent Paced: 0 %
Brady Statistic RV Percent Paced: 96.2 %
Date Time Interrogation Session: 20240802190410
Implantable Lead Connection Status: 753985
Implantable Lead Connection Status: 753985
Implantable Lead Implant Date: 20161115
Implantable Lead Implant Date: 20161115
Implantable Lead Location: 753859
Implantable Lead Location: 753860
Implantable Lead Model: 5076
Implantable Lead Model: 5076
Implantable Pulse Generator Implant Date: 20161115
Lead Channel Impedance Value: 361 Ohm
Lead Channel Impedance Value: 361 Ohm
Lead Channel Impedance Value: 456 Ohm
Lead Channel Impedance Value: 475 Ohm
Lead Channel Pacing Threshold Amplitude: 0.5 V
Lead Channel Pacing Threshold Amplitude: 0.875 V
Lead Channel Pacing Threshold Amplitude: 1 V
Lead Channel Pacing Threshold Pulse Width: 0.4 ms
Lead Channel Pacing Threshold Pulse Width: 0.4 ms
Lead Channel Pacing Threshold Pulse Width: 0.4 ms
Lead Channel Sensing Intrinsic Amplitude: 2.625 mV
Lead Channel Sensing Intrinsic Amplitude: 20.25 mV
Lead Channel Sensing Intrinsic Amplitude: 20.25 mV
Lead Channel Sensing Intrinsic Amplitude: 4.25 mV
Lead Channel Setting Pacing Amplitude: 2.5 V
Lead Channel Setting Pacing Pulse Width: 0.4 ms
Lead Channel Setting Sensing Sensitivity: 2.8 mV
Zone Setting Status: 755011
Zone Setting Status: 755011

## 2023-02-05 NOTE — Patient Instructions (Signed)
Medication Instructions:   Continue all current medications.   Labwork:  none  Testing/Procedures:  none  Follow-Up:  As directed   Any Other Special Instructions Will Be Listed Below (If Applicable).   If you need a refill on your cardiac medications before your next appointment, please call your pharmacy.

## 2023-02-05 NOTE — H&P (View-Only) (Signed)
    PCP: Ignatius Specking, MD Primary Cardiologist: Dr Diona Browner Primary EP:  Dr Leitha Schuller Dylan Tyler is a 77 y.o. male who presents today for routine electrophysiology followup.  Since last being seen in our clinic, the patient reports doing reasonably well.  He is not active.  + dependant edema.  + chronic SOB with activity.    Today, he denies symptoms of palpitations, chest pain,  dizziness, presyncope, or syncope. he has no device related complaints -- no new tenderness, drainage, redness. The patient is otherwise without complaint today.     Physical Exam: Vitals:   02/05/23 1518  BP: 138/88  Pulse: 70  SpO2: 97%  Weight: 245 lb 3.2 oz (111.2 kg)  Height: 5\' 9"  (1.753 m)     Gen: Appears comfortable, well-nourished CV: RRR, no dependent edema The device site is normal -- no tenderness, edema, drainage, redness, threatened erosion. Pulm: breathing easily   Pacemaker interrogation- reviewed in detail today,  See PACEART report ECG reviewed today shows atrial flutter with V-pacing   Assessment and Plan:  1. Symptomatic complete heart block Normal pacemaker function - device is at RRT See Arita Miss Art report No changes today he is device dependant today  We are scheduling him for generator change.  I explained the procedure to him, the indication and risks including infection, bleeding, damage to the existing leads.  I explained that the risk of a serious complication or death is very low but nonzero.  2. Permanent afib Rate controlled On eliquis Labs from 01/2023 reviewed  3. VT: asymptomatic, lasted < 1 minute. I don't think risk of extraction and upgrade to ICD is warranted.  -Continue metoprolol XL 50 daily  4. HTN Stable No change required today  4. Obesity Body mass index is 36.21 kg/m. Lifestyle modification advised  5. Nonischemic CM (chronic) Ef has recovered (recent echo reviewed)  6. Tobacco Cessation advised He is thinking about  quitting.   [name], MD 02/05/2023 3:35 PM

## 2023-02-05 NOTE — Progress Notes (Signed)
    PCP: Ignatius Specking, MD Primary Cardiologist: Dr Diona Browner Primary EP:  Dr Leitha Schuller Dylan Tyler is a 77 y.o. male who presents today for routine electrophysiology followup.  Since last being seen in our clinic, the patient reports doing reasonably well.  He is not active.  + dependant edema.  + chronic SOB with activity.    Today, he denies symptoms of palpitations, chest pain,  dizziness, presyncope, or syncope. he has no device related complaints -- no new tenderness, drainage, redness. The patient is otherwise without complaint today.     Physical Exam: Vitals:   02/05/23 1518  BP: 138/88  Pulse: 70  SpO2: 97%  Weight: 245 lb 3.2 oz (111.2 kg)  Height: 5\' 9"  (1.753 m)     Gen: Appears comfortable, well-nourished CV: RRR, no dependent edema The device site is normal -- no tenderness, edema, drainage, redness, threatened erosion. Pulm: breathing easily   Pacemaker interrogation- reviewed in detail today,  See PACEART report ECG reviewed today shows atrial flutter with V-pacing   Assessment and Plan:  1. Symptomatic complete heart block Normal pacemaker function - device is at RRT See Arita Miss Art report No changes today he is device dependant today  We are scheduling him for generator change.  I explained the procedure to him, the indication and risks including infection, bleeding, damage to the existing leads.  I explained that the risk of a serious complication or death is very low but nonzero.  2. Permanent afib Rate controlled On eliquis Labs from 01/2023 reviewed  3. VT: asymptomatic, lasted < 1 minute. I don't think risk of extraction and upgrade to ICD is warranted.  -Continue metoprolol XL 50 daily  4. HTN Stable No change required today  4. Obesity Body mass index is 36.21 kg/m. Lifestyle modification advised  5. Nonischemic CM (chronic) Ef has recovered (recent echo reviewed)  6. Tobacco Cessation advised He is thinking about  quitting.   [name], MD 02/05/2023 3:35 PM

## 2023-02-08 ENCOUNTER — Other Ambulatory Visit: Payer: Self-pay | Admitting: *Deleted

## 2023-02-08 DIAGNOSIS — I10 Essential (primary) hypertension: Secondary | ICD-10-CM

## 2023-02-08 DIAGNOSIS — Z01812 Encounter for preprocedural laboratory examination: Secondary | ICD-10-CM

## 2023-02-12 DIAGNOSIS — R059 Cough, unspecified: Secondary | ICD-10-CM | POA: Diagnosis not present

## 2023-02-12 DIAGNOSIS — Z01812 Encounter for preprocedural laboratory examination: Secondary | ICD-10-CM | POA: Diagnosis not present

## 2023-02-12 DIAGNOSIS — Z299 Encounter for prophylactic measures, unspecified: Secondary | ICD-10-CM | POA: Diagnosis not present

## 2023-02-12 DIAGNOSIS — I10 Essential (primary) hypertension: Secondary | ICD-10-CM | POA: Diagnosis not present

## 2023-02-12 DIAGNOSIS — J069 Acute upper respiratory infection, unspecified: Secondary | ICD-10-CM | POA: Diagnosis not present

## 2023-02-12 DIAGNOSIS — F17210 Nicotine dependence, cigarettes, uncomplicated: Secondary | ICD-10-CM | POA: Diagnosis not present

## 2023-02-12 DIAGNOSIS — R0981 Nasal congestion: Secondary | ICD-10-CM | POA: Diagnosis not present

## 2023-02-16 ENCOUNTER — Other Ambulatory Visit: Payer: Self-pay | Admitting: Cardiovascular Disease

## 2023-02-18 ENCOUNTER — Ambulatory Visit (HOSPITAL_COMMUNITY)
Admission: RE | Admit: 2023-02-18 | Discharge: 2023-02-18 | Disposition: A | Discharge status: Home / Self Care | Payer: PPO | Attending: Cardiovascular Disease | Admitting: Cardiovascular Disease

## 2023-02-18 ENCOUNTER — Other Ambulatory Visit: Payer: Self-pay

## 2023-02-18 ENCOUNTER — Ambulatory Visit (HOSPITAL_COMMUNITY)
Admission: RE | Disposition: A | Discharge status: Home / Self Care | Payer: Self-pay | Source: Home / Self Care | Attending: Cardiovascular Disease

## 2023-02-18 DIAGNOSIS — Z45010 Encounter for checking and testing of cardiac pacemaker pulse generator [battery]: Secondary | ICD-10-CM | POA: Insufficient documentation

## 2023-02-18 DIAGNOSIS — I4821 Permanent atrial fibrillation: Secondary | ICD-10-CM | POA: Insufficient documentation

## 2023-02-18 DIAGNOSIS — I428 Other cardiomyopathies: Secondary | ICD-10-CM | POA: Diagnosis not present

## 2023-02-18 DIAGNOSIS — E669 Obesity, unspecified: Secondary | ICD-10-CM | POA: Diagnosis not present

## 2023-02-18 DIAGNOSIS — Z6836 Body mass index (BMI) 36.0-36.9, adult: Secondary | ICD-10-CM | POA: Insufficient documentation

## 2023-02-18 DIAGNOSIS — F17200 Nicotine dependence, unspecified, uncomplicated: Secondary | ICD-10-CM | POA: Diagnosis not present

## 2023-02-18 DIAGNOSIS — I10 Essential (primary) hypertension: Secondary | ICD-10-CM | POA: Diagnosis not present

## 2023-02-18 DIAGNOSIS — Z7901 Long term (current) use of anticoagulants: Secondary | ICD-10-CM | POA: Insufficient documentation

## 2023-02-18 DIAGNOSIS — Z79899 Other long term (current) drug therapy: Secondary | ICD-10-CM | POA: Insufficient documentation

## 2023-02-18 HISTORY — PX: PPM GENERATOR CHANGEOUT: EP1233

## 2023-02-18 LAB — GLUCOSE, CAPILLARY: Glucose-Capillary: 190 mg/dL — ABNORMAL HIGH (ref 70–99)

## 2023-02-18 SURGERY — PPM GENERATOR CHANGEOUT

## 2023-02-18 MED ORDER — FENTANYL CITRATE (PF) 100 MCG/2ML IJ SOLN
INTRAMUSCULAR | Status: DC | PRN
  Administered 2023-02-18: 50 ug via INTRAVENOUS

## 2023-02-18 MED ORDER — CEFAZOLIN SODIUM-DEXTROSE 2-4 GM/100ML-% IV SOLN
INTRAVENOUS | Status: AC
  Filled 2023-02-18: qty 100

## 2023-02-18 MED ORDER — MIDAZOLAM HCL 2 MG/2ML IJ SOLN
INTRAMUSCULAR | Status: AC
  Filled 2023-02-18: qty 2

## 2023-02-18 MED ORDER — MIDAZOLAM HCL 5 MG/5ML IJ SOLN
INTRAMUSCULAR | Status: DC | PRN
  Administered 2023-02-18: 2 mg via INTRAVENOUS

## 2023-02-18 MED ORDER — LIDOCAINE HCL (PF) 1 % IJ SOLN
INTRAMUSCULAR | Status: AC
  Filled 2023-02-18: qty 60

## 2023-02-18 MED ORDER — ACETAMINOPHEN 325 MG PO TABS: 325.0000 mg | ORAL_TABLET | ORAL | Status: DC | PRN

## 2023-02-18 MED ORDER — CHLORHEXIDINE GLUCONATE 4 % EX SOLN: 4.0000 | Freq: Once | CUTANEOUS | Status: DC

## 2023-02-18 MED ORDER — SODIUM CHLORIDE 0.9 % IV SOLN: INTRAVENOUS | Status: DC

## 2023-02-18 MED ORDER — LIDOCAINE HCL (PF) 1 % IJ SOLN
INTRAMUSCULAR | Status: DC | PRN
  Administered 2023-02-18: 60 mL

## 2023-02-18 MED ORDER — FENTANYL CITRATE (PF) 100 MCG/2ML IJ SOLN
INTRAMUSCULAR | Status: AC
  Filled 2023-02-18: qty 2

## 2023-02-18 MED ORDER — CEFAZOLIN SODIUM-DEXTROSE 2-4 GM/100ML-% IV SOLN
2.0000 g | INTRAVENOUS | Status: AC
  Administered 2023-02-18: 2 g via INTRAVENOUS

## 2023-02-18 MED ORDER — ONDANSETRON HCL 4 MG/2ML IJ SOLN: 4.0000 mg | Freq: Four times a day (QID) | INTRAMUSCULAR | Status: DC | PRN

## 2023-02-18 MED ORDER — SODIUM CHLORIDE 0.9 % IV SOLN
80.0000 mg | INTRAVENOUS | Status: AC
  Administered 2023-02-18: 80 mg

## 2023-02-18 MED ORDER — POVIDONE-IODINE 10 % EX SWAB
2.0000 | Freq: Once | CUTANEOUS | Status: AC
  Administered 2023-02-18: 2 via TOPICAL

## 2023-02-18 MED ORDER — SODIUM CHLORIDE 0.9 % IV SOLN
INTRAVENOUS | Status: AC
  Filled 2023-02-18: qty 2

## 2023-02-18 SURGICAL SUPPLY — 8 items
CABLE SURGICAL S-101-97-12 (CABLE) ×1 IMPLANT
IPG PACE AZUR XT DR MRI W1DR01 (Pacemaker) IMPLANT
KIT WRENCH (KITS) IMPLANT
PACE AZURE XT DR MRI W1DR01 (Pacemaker) ×1 IMPLANT
PAD DEFIB RADIO PHYSIO CONN (PAD) ×1 IMPLANT
POUCH AIGIS-R ANTIBACT PPM (Mesh General) ×1 IMPLANT
POUCH AIGIS-R ANTIBACT PPM MED (Mesh General) IMPLANT
TRAY PACEMAKER INSERTION (PACKS) ×1 IMPLANT

## 2023-02-18 NOTE — Progress Notes (Signed)
Per MD Mealor, Patient is able to discharge in 30 minutes after procedure

## 2023-02-18 NOTE — Interval H&P Note (Signed)
History and Physical Interval Note:  02/18/2023 4:44 PM  DELORIAN SABIA  has presented today for surgery, with the diagnosis of eri.  The various methods of treatment have been discussed with the patient and family. After consideration of risks, benefits and other options for treatment, the patient has consented to  Procedure(s): PPM GENERATOR CHANGEOUT (N/A) as a surgical intervention.  The patient's history has been reviewed, patient examined, no change in status, stable for surgery.  I have reviewed the patient's chart and labs.  Questions were answered to the patient's satisfaction.     Roberts Gaudy 

## 2023-02-18 NOTE — Discharge Instructions (Signed)

## 2023-02-19 ENCOUNTER — Encounter (HOSPITAL_COMMUNITY): Payer: Self-pay | Admitting: Cardiovascular Disease

## 2023-02-19 MED FILL — Midazolam HCl Inj 2 MG/2ML (Base Equivalent): INTRAMUSCULAR | Qty: 2 | Status: AC

## 2023-02-23 ENCOUNTER — Other Ambulatory Visit: Payer: Self-pay | Admitting: Nurse Practitioner

## 2023-03-04 ENCOUNTER — Ambulatory Visit: Payer: PPO | Attending: Cardiovascular Disease

## 2023-03-04 DIAGNOSIS — I700 Atherosclerosis of aorta: Secondary | ICD-10-CM | POA: Diagnosis not present

## 2023-03-04 DIAGNOSIS — L03119 Cellulitis of unspecified part of limb: Secondary | ICD-10-CM | POA: Diagnosis not present

## 2023-03-04 DIAGNOSIS — M459 Ankylosing spondylitis of unspecified sites in spine: Secondary | ICD-10-CM | POA: Diagnosis not present

## 2023-03-04 DIAGNOSIS — I25119 Atherosclerotic heart disease of native coronary artery with unspecified angina pectoris: Secondary | ICD-10-CM | POA: Diagnosis not present

## 2023-03-04 DIAGNOSIS — Z299 Encounter for prophylactic measures, unspecified: Secondary | ICD-10-CM | POA: Diagnosis not present

## 2023-03-04 DIAGNOSIS — I442 Atrioventricular block, complete: Secondary | ICD-10-CM | POA: Diagnosis not present

## 2023-03-04 DIAGNOSIS — F17210 Nicotine dependence, cigarettes, uncomplicated: Secondary | ICD-10-CM | POA: Diagnosis not present

## 2023-03-04 DIAGNOSIS — I10 Essential (primary) hypertension: Secondary | ICD-10-CM | POA: Diagnosis not present

## 2023-03-04 LAB — CUP PACEART INCLINIC DEVICE CHECK
Battery Remaining Longevity: 153 mo
Battery Voltage: 3.23 V
Brady Statistic AP VP Percent: 0 %
Brady Statistic AP VS Percent: 0 %
Brady Statistic AS VP Percent: 97.56 %
Brady Statistic AS VS Percent: 2.44 %
Brady Statistic RA Percent Paced: 0 %
Brady Statistic RV Percent Paced: 97.88 %
Date Time Interrogation Session: 20240829124649
Implantable Lead Connection Status: 753985
Implantable Lead Connection Status: 753985
Implantable Lead Implant Date: 20161115
Implantable Lead Implant Date: 20161115
Implantable Lead Location: 753859
Implantable Lead Location: 753860
Implantable Lead Model: 5076
Implantable Lead Model: 5076
Implantable Pulse Generator Implant Date: 20240815
Lead Channel Impedance Value: 342 Ohm
Lead Channel Impedance Value: 361 Ohm
Lead Channel Impedance Value: 456 Ohm
Lead Channel Impedance Value: 608 Ohm
Lead Channel Pacing Threshold Amplitude: 0.875 V
Lead Channel Pacing Threshold Pulse Width: 0.4 ms
Lead Channel Sensing Intrinsic Amplitude: 2.5 mV
Lead Channel Sensing Intrinsic Amplitude: 3 mV
Lead Channel Setting Pacing Amplitude: 2 V
Lead Channel Setting Pacing Pulse Width: 0.4 ms
Lead Channel Setting Sensing Sensitivity: 2.8 mV
Zone Setting Status: 755011

## 2023-03-04 NOTE — Patient Instructions (Addendum)
After Your Pacemaker   Monitor your pacemaker site for redness, swelling, and drainage. Call the device clinic at [phone] if you experience these symptoms or fever/chills.  Your incision was closed with Steri-strips or staples:  You may shower 7 days after your procedure and wash your incision with soap and water. Avoid lotions, ointments, or perfumes over your incision until it is well-healed.  You may use a hot tub or a pool after your wound check appointment if the incision is completely closed.  There are no restrictions in arm movement after your wound check appointment.  You may drive, unless driving has been restricted by your healthcare providers.  Remote monitoring is used to monitor your pacemaker from home. This monitoring is scheduled every 91 days by our office. It allows us to keep an eye on the functioning of your device to ensure it is working properly. You will routinely see your Electrophysiologist annually (more often if necessary).  

## 2023-03-04 NOTE — Progress Notes (Signed)
Wound check appointment. Steri-strips removed. Wound without redness or edema. Incision edges approximated, wound well healed. Normal device function. Thresholds, sensing, and impedances consistent with implant measurements. Device programmed at chronic settings post gen change. Histogram distribution appropriate for patient and level of activity. Pt with history of permanent afib on Eliquis.  Will continue to monitor for persistence of afib-reevaluate pacemaker programming at 91 day follow up. Patient educated about wound care. ROV in 3 months with implanting physician.

## 2023-03-09 ENCOUNTER — Other Ambulatory Visit: Payer: Self-pay | Admitting: Cardiology

## 2023-03-09 NOTE — Telephone Encounter (Signed)
Prescription refill request for Eliquis received. Indication: AF Last office visit: 02/05/23  A Mealor MD Scr: 1.66 on 02/12/23 Age: 77 Weight: 111.2kg  Based on above findings Eliquis 5mg  twice daily is the appropriate dose.  Refill approved.

## 2023-03-11 ENCOUNTER — Ambulatory Visit: Payer: PPO | Admitting: Nurse Practitioner

## 2023-03-11 ENCOUNTER — Encounter: Payer: Self-pay | Admitting: Nurse Practitioner

## 2023-03-11 VITALS — BP 134/71 | HR 73 | Ht 69.0 in | Wt 259.0 lb

## 2023-03-11 DIAGNOSIS — F17200 Nicotine dependence, unspecified, uncomplicated: Secondary | ICD-10-CM | POA: Diagnosis not present

## 2023-03-11 DIAGNOSIS — Z794 Long term (current) use of insulin: Secondary | ICD-10-CM | POA: Diagnosis not present

## 2023-03-11 DIAGNOSIS — I10 Essential (primary) hypertension: Secondary | ICD-10-CM | POA: Diagnosis not present

## 2023-03-11 DIAGNOSIS — E1122 Type 2 diabetes mellitus with diabetic chronic kidney disease: Secondary | ICD-10-CM | POA: Diagnosis not present

## 2023-03-11 DIAGNOSIS — N1832 Chronic kidney disease, stage 3b: Secondary | ICD-10-CM | POA: Diagnosis not present

## 2023-03-11 DIAGNOSIS — E782 Mixed hyperlipidemia: Secondary | ICD-10-CM | POA: Diagnosis not present

## 2023-03-11 LAB — POCT GLYCOSYLATED HEMOGLOBIN (HGB A1C): Hemoglobin A1C: 8.2 % — AB (ref 4.0–5.6)

## 2023-03-11 NOTE — Progress Notes (Signed)
03/11/2023            Endocrinology follow-up note   Subjective:    Patient ID: Dylan Tyler, male    DOB: 09/20/1945.  he is being seen in follow-up  for management of currently uncontrolled, complicated type 2 diabetes, hyperlipidemia, hypertension.   PMD:  Ignatius Specking, MD.   Past Medical History:  Diagnosis Date   Ankylosing spondylitis (HCC)    Anxiety    Aortic root dilatation (HCC)    AR (aortic regurgitation)    Atrial fibrillation and flutter (HCC)    Bladder cancer (HCC)    Chronic pain    Followed at [facility]   COPD (chronic obstructive pulmonary disease) (HCC)    DDD (degenerative disc disease), lumbosacral    DDD (degenerative disc disease), thoracolumbar    Degenerative cervical disc    Depression    Diabetes mellitus type II    Diverticulitis    Essential hypertension    GERD (gastroesophageal reflux disease)    History of kidney stones    Hyperlipidemia    Migraine    Nonischemic cardiomyopathy (HCC)    Cardiolite 12/09 LVEF 55%, minor luminal irregularities at cardiac catheterization   OSA on CPAP    Rheumatoid arthritis (HCC)    Symptomatic bradycardia    Medtronic Advisa L dual-chamber pacemaker  05/2015 - Dr. Elberta Fortis   Past Surgical History:  Procedure Laterality Date   CARDIAC CATHETERIZATION  08/13/11   CARDIOVERSION N/A 07/02/2015   Procedure: CARDIOVERSION;  Surgeon: Thurmon Fair, MD;  Location: [facility];  Service: Cardiovascular;  Laterality: N/A;   CATARACT EXTRACTION W/PHACO Left 02/10/2019   Procedure: CATARACT EXTRACTION PHACO AND INTRAOCULAR LENS PLACEMENT (IOC);  Surgeon: Fabio Pierce, MD;  Location: [facility] ORS;  Service: Ophthalmology;  Laterality: Left;  CDE: 7.10   CATARACT EXTRACTION W/PHACO Right 02/27/2019   Procedure: CATARACT EXTRACTION PHACO AND INTRAOCULAR LENS PLACEMENT RIGHT EYE CDE=11.84;  Surgeon: Fabio Pierce, MD;  Location: [facility];  Service: Ophthalmology;  Laterality: Right;  right   CYSTOSCOPY WITH HOLMIUM LASER  LITHOTRIPSY  1990's   EP IMPLANTABLE DEVICE N/A 05/21/2015   MDT Advisa DR MRI pacemaker implanted by Dr Elberta Fortis   KNEE ARTHROSCOPY Right ~ 1991   LAPAROSCOPIC CHOLECYSTECTOMY  ~ 2008   PPM GENERATOR CHANGEOUT N/A 02/18/2023   Procedure: PPM GENERATOR CHANGEOUT;  Surgeon: Nelly Laurence, Roberts Gaudy, MD;  Location: [facility] INV[facility] CV LAB;  Service: Cardiovascular;  Laterality: N/A;   TRANSURETHRAL RESECTION OF BLADDER TUMOR WITH GYRUS (TURBT-GYRUS)  1990's?   Social History   Socioeconomic History   Marital status: Married    Spouse name: Not on file   Number of children: Not on file   Years of education: Not on file   Highest education level: Not on file  Occupational History   Occupation: Disabled    Employer: DISABLED  Tobacco Use   Smoking status: Every Day    Current packs/day: 0.50    Average packs/day: 0.5 packs/day for 64.0 years (32.0 ttl pk-yrs)    Types: E-cigarettes, Cigarettes    Start date: 02/26/1959   Smokeless tobacco: Former    Types: Chew   Tobacco comments:    05/21/2015 "quit chewing in 1992"  Vaping Use   Vaping status: Every Day   Substances: Nicotine, Flavoring  Substance and Sexual Activity   Alcohol use: Yes    Alcohol/week: 2.0 standard drinks of alcohol    Types: 2 Cans of beer per week   Drug use: Yes  Types: Cocaine    Comment: 05/21/2015 "quit in the early 2000"   Sexual activity: Not on file  Other Topics Concern   Not on file  Social History Narrative   Divorced   No regular exercise   Social Determinants of Health   Financial Resource Strain: Not on file  Food Insecurity: Not on file  Transportation Needs: Not on file  Physical Activity: Not on file  Stress: Not on file  Social Connections: Not on file   Outpatient Encounter Medications as of 03/11/2023  Medication Sig   acetaminophen (TYLENOL) 500 MG tablet Take 500 mg by mouth every 6 (six) hours as needed (pain).    albuterol (PROVENTIL) (2.5 MG/3ML) 0.083% nebulizer solution Take 2.5  mg by nebulization every 6 (six) hours as needed for wheezing.   albuterol (VENTOLIN HFA) 108 (90 Base) MCG/ACT inhaler Inhale 1-2 puffs into the lungs every 4 (four) hours as needed for wheezing or shortness of breath.   Alcohol Swabs 70 % PADS Use as directed   ALPRAZolam (XANAX) 0.5 MG tablet Take 0.5 mg by mouth 2 (two) times daily as needed for anxiety.   apixaban (ELIQUIS) 5 MG TABS tablet Take 1 tablet by mouth twice daily   BD PEN NEEDLE NANO 2ND GEN 32G X 4 MM MISC USE  4 TIMES DAILY AS  DIRECTED   bismuth subsalicylate (PEPTO BISMOL) 262 MG chewable tablet Chew 524 mg by mouth as needed for indigestion.   calcitRIOL (ROCALTROL) 0.25 MCG capsule Take 0.25 mcg by mouth every Monday, Wednesday, and Friday.   Cholecalciferol (VITAMIN D-3) 1000 UNITS CAPS Take 1,000 Units by mouth 2 (two) times daily.    cholestyramine light (PREVALITE) 4 g packet Take 4 g by mouth daily as needed (diarrhea).   citalopram (CELEXA) 40 MG tablet Take 40 mg by mouth 2 (two) times daily.   Continuous Blood Gluc Receiver (FREESTYLE LIBRE 2 READER) DEVI USE WITH SENSORS TO MONITOR BLOOD SUGAR.   Continuous Glucose Sensor (FREESTYLE LIBRE 2 SENSOR) MISC 1 each by Other route every 14 (fourteen) days.   diphenhydrAMINE (BENADRYL) 25 MG tablet Take 25 mg by mouth daily as needed for allergies.   Fluticasone-Umeclidin-Vilant (TRELEGY ELLIPTA) 200-62.5-25 MCG/ACT AEPB Inhale 1 puff into the lungs daily.   furosemide (LASIX) 40 MG tablet TAKE TWO (2) TABLETS BY MOUTH THREE TIMES WEEKLY AND ONE TABLET FOUR TIMES WEEKLY.   gabapentin (NEURONTIN) 400 MG capsule Take 400-800 mg by mouth See admin instructions. Take 400 mg in the morning and 800 mg in the evening   IBU 800 MG tablet Take 800 mg by mouth every 6 (six) hours as needed for moderate pain.   insulin glargine (LANTUS SOLOSTAR) 100 UNIT/ML Solostar Pen Patient is to inject 70 units at supper.   insulin lispro (HUMALOG KWIKPEN) 100 UNIT/ML KwikPen Inject 20-26 Units  into the skin 3 (three) times daily.   Insulin Pen Needle (B-D ULTRAFINE III SHORT PEN) 31G X 8 MM MISC Use to inject insulin 4 x daily   Lancets (FREESTYLE) lancets USE TO CHECK BLOOD SUGAR FOUR TIMES DAILY.   magnesium oxide (MAG-OX) 400 (240 Mg) MG tablet Take 1 tablet by mouth daily.   metoCLOPramide (REGLAN) 5 MG tablet Take 5 mg by mouth every 6 (six) hours as needed for nausea or vomiting.   metoprolol succinate (TOPROL-XL) 50 MG 24 hr tablet TAKE 1 TABLET BY MOUTH ONCE DAILY *STOP CARVEDILOL*   NON FORMULARY 1 each by Other route See admin instructions. CPAP -  Uses nightly   sacubitril-valsartan (ENTRESTO) 24-26 MG Take 1 tablet by mouth 2 (two) times daily.   simvastatin (ZOCOR) 40 MG tablet Take 1 tablet (40 mg total) by mouth at bedtime.   traMADol (ULTRAM) 50 MG tablet Take 1 tablet by mouth every 8 (eight) hours as needed (pain).   triamcinolone cream (KENALOG) 0.1 % Apply 1 application  topically 2 (two) times daily as needed (rash).   vitamin B-12 (CYANOCOBALAMIN) 1000 MCG tablet Take 1,000 mcg by mouth 2 (two) times daily.   VOLTAREN 1 % GEL Apply 2 g topically 4 (four) times daily as needed (for pain).    nitroGLYCERIN (NITROSTAT) 0.4 MG SL tablet DISSOLVE ONE TABLET UNDER TONGUE EVERY 5 MINUTES UP TO 3 DOSES AS NEEDED FOR CHEST PAIN (IF NO RELIEF AFTER 2ND DOSE GO TO ED OR CALL 911)   No facility-administered encounter medications on file as of 03/11/2023.    ALLERGIES: No Known Allergies  VACCINATION STATUS:  There is no immunization history on file for this patient.  Diabetes He presents for his follow-up diabetic visit. He has type 2 diabetes mellitus. Onset time: He was diagnosed at approximate age of 61 years. His disease course has been improving. There are no hypoglycemic associated symptoms. Pertinent negatives for hypoglycemia include no confusion, hunger, pallor or seizures. Associated symptoms include foot paresthesias. Pertinent negatives for diabetes include no  fatigue, no polydipsia, no polyphagia, no polyuria, no weakness and no weight loss (intentional). There are no hypoglycemic complications. Symptoms are stable. Diabetic complications include heart disease, nephropathy and peripheral neuropathy. Risk factors for coronary artery disease include dyslipidemia, diabetes mellitus, hypertension, male sex, sedentary lifestyle, tobacco exposure, family history and obesity. Current diabetic treatment includes intensive insulin program. He is compliant with treatment some of the time (falls asleep early and forgets to take Lantus). His weight is increasing steadily. He is following a generally unhealthy diet. When asked about meal planning, he reported none. He has not had a previous visit with a dietitian. He never [facility] with cane due to bilateral knee arthritis) participates in exercise. His home blood glucose trend is decreasing steadily. His overall blood glucose range is >200 mg/dl. (He presents today, accompanied by his wife, with his CGM showing improved glycemic profile yet still above target.  His POCT A1c today is 8.2%, improving from last visit of 9.3%.  Analysis of his CGM shows TIR 30%, TAR 70%, TBR 0% with a GMI of 8.3%.  He has done better with injecting his Lantus but still sometimes forgets his Novolog.  He recently had ICD replaced.  He reports significant leg swelling.) An ACE inhibitor/angiotensin II receptor blocker is being taken. He sees a podiatrist.Eye exam is current.  Hyperlipidemia This is a chronic problem. The current episode started more than 1 year ago. The problem is controlled. Recent lipid tests were reviewed and are normal. Exacerbating diseases include chronic renal disease, diabetes and obesity. Factors aggravating his hyperlipidemia include smoking, fatty foods and beta blockers. Pertinent negatives include no myalgias or shortness of breath. Current antihyperlipidemic treatment includes statins. The current treatment provides mild  improvement of lipids. Compliance problems include adherence to diet and adherence to exercise.  Risk factors for coronary artery disease include dyslipidemia, diabetes mellitus, hypertension, a sedentary lifestyle, male sex and obesity.  Hypertension This is a chronic problem. The current episode started more than 1 year ago. The problem has been resolved since onset. The problem is controlled. Pertinent negatives include no shortness of breath. There are  no associated agents to hypertension. Risk factors for coronary artery disease include diabetes mellitus, dyslipidemia, family history, male gender, obesity, sedentary lifestyle and smoking/tobacco exposure. Past treatments include beta blockers and diuretics. The current treatment provides significant improvement. Compliance problems include diet and exercise.  Hypertensive end-organ damage includes kidney disease and CAD/MI. Identifiable causes of hypertension include chronic renal disease.    Review of systems  Constitutional: +increasing body weight,  current  Body mass index is 38.25 kg/m. , + fatigue, no subjective hyperthermia, no subjective hypothermia Eyes: no blurry vision, no xerophthalmia ENT: no sore throat, no nodules palpated in throat, no dysphagia/odynophagia, no hoarseness Cardiovascular: no Chest Pain, no Shortness of Breath, no palpitations, + significant leg swelling- to the point of weeping Respiratory: chronic cough (smoker), intermittent shortness of breath Gastrointestinal: no Nausea/Vomiting/Diarrhea Musculoskeletal: bilateral knee pain (under evaluation for bilateral TKR), has had multiple falls recently Skin: no rashes, no hyperemia, multiple nonhealing wounds to BLE Neurological: no tremors, no numbness, no tingling Psychiatric: no depression, no anxiety   Objective:    BP 134/71 (BP Location: Left Arm, Patient Position: Sitting, Cuff Size: Large)   Pulse 73   Ht 5\' 9"  (1.753 m)   Wt 259 lb (117.5 kg)   BMI  38.25 kg/m   Wt Readings from Last 3 Encounters:  03/11/23 259 lb (117.5 kg)  02/18/23 244 lb (110.7 kg)  02/05/23 245 lb 3.2 oz (111.2 kg)    BP Readings from Last 3 Encounters:  03/11/23 134/71  02/18/23 123/71  02/05/23 138/88    Physical Exam- Limited  Constitutional:  Body mass index is 38.25 kg/m. , not in acute distress, normal state of mind Eyes:  EOMI, no exophthalmos Musculoskeletal: no gross deformities, strength intact in all four extremities Skin:  no rashes, no hyperemia Neurological: no tremor with outstretched hands   Diabetic Foot Exam - Simple   No data filed      Lipid Panel     Component Value Date/Time   CHOL 101 12/03/2021 0000   TRIG 113 12/03/2021 0000   HDL 32 (A) 12/03/2021 0000   LDLCALC 46 12/03/2021 0000   Recent Results (from the past 2160 hour(s))  CUP PACEART REMOTE DEVICE CHECK     Status: None   Collection Time: 12/24/22  3:55 PM  Result Value Ref Range   Date Time Interrogation Session [id number]    Pulse Generator Manufacturer MERM    Pulse Gen Model A2DR01 Advisa DR MRI    Pulse Gen Serial Number [id number]    Clinic Name [facility]    Implantable Pulse Generator Type Implantable Pulse Generator    Implantable Pulse Generator Implant Date [id number]    Implantable Lead Manufacturer [facility]    Implantable Lead Model 5076 CapSureFix Novus    Implantable Lead Serial Number [id number]    Implantable Lead Implant Date 02725366    Implantable Lead Location Detail 1 UNKNOWN    Implantable Lead Location [id number]    Implantable Lead Connection Status [id number]    Implantable Lead Manufacturer [facility]    Implantable Lead Model 5076 CapSureFix Novus    Implantable Lead Serial Number [id number]    Implantable Lead Implant Date 44034742    Implantable Lead Location Detail 1 UNKNOWN    Implantable Lead Location [id number]    Implantable Lead Connection Status [id number]    Lead Channel Setting Sensing Sensitivity 2.8 mV   Lead Channel Setting  Pacing Pulse Width 0.4 ms   Lead Channel Setting Pacing Amplitude 2.5 V  Zone Setting Status 755011    Zone Setting Status 755011    Lead Channel Impedance Value 494 ohm   Lead Channel Impedance Value 361 ohm   Lead Channel Sensing Intrinsic Amplitude 4.625 mV   Lead Channel Sensing Intrinsic Amplitude 4.625 mV   Lead Channel Pacing Threshold Amplitude 0.5 V   Lead Channel Pacing Threshold Pulse Width 0.4 ms   Lead Channel Impedance Value 456 ohm   Lead Channel Impedance Value 361 ohm   Lead Channel Sensing Intrinsic Amplitude 27.25 mV   Lead Channel Sensing Intrinsic Amplitude 27.25 mV   Lead Channel Pacing Threshold Amplitude 0.75 V   Lead Channel Pacing Threshold Pulse Width 0.4 ms   Battery Status OK    Battery Remaining Longevity 1 mo   Battery Voltage 2.83 V   Brady Statistic RA Percent Paced 0 %   Brady Statistic RV Percent Paced 97.78 %   Brady Statistic AP VP Percent 0 %   Brady Statistic AS VP Percent 97.43 %   Brady Statistic AP VS Percent 0 %   Brady Statistic AS VS Percent 2.57 %  CUP PACEART REMOTE DEVICE CHECK     Status: None   Collection Time: 01/22/23  4:50 PM  Result Value Ref Range   Date Time Interrogation Session [id number]    Pulse Generator Manufacturer MERM    Pulse Gen Model A2DR01 Advisa DR MRI    Pulse Gen Serial Number [id number]    Clinic Name [facility]    Implantable Pulse Generator Type Implantable Pulse Generator    Implantable Pulse Generator Implant Date 29518841    Implantable Lead Manufacturer MERM    Implantable Lead Model 5076 CapSureFix Novus    Implantable Lead Serial Number [id number]    Implantable Lead Implant Date 09323557    Implantable Lead Location Detail 1 UNKNOWN    Implantable Lead Location [id number]    Implantable Lead Connection Status [id number]    Implantable Lead Manufacturer MERM    Implantable Lead Model 5076 CapSureFix Novus    Implantable Lead Serial Number [id number]    Implantable Lead Implant Date 62376283     Implantable Lead Location Detail 1 UNKNOWN    Implantable Lead Location [id number]    Implantable Lead Connection Status [id number]    Lead Channel Setting Sensing Sensitivity 2.8 mV   Lead Channel Setting Pacing Pulse Width 0.4 ms   Lead Channel Setting Pacing Amplitude 2.5 V   Zone Setting Status 755011    Zone Setting Status [phone]    Lead Channel Impedance Value 475 ohm   Lead Channel Impedance Value 342 ohm   Lead Channel Sensing Intrinsic Amplitude 3.125 mV   Lead Channel Sensing Intrinsic Amplitude 3.125 mV   Lead Channel Pacing Threshold Amplitude 0.5 V   Lead Channel Pacing Threshold Pulse Width 0.4 ms   Lead Channel Impedance Value 456 ohm   Lead Channel Impedance Value 361 ohm   Lead Channel Sensing Intrinsic Amplitude 23.625 mV   Lead Channel Sensing Intrinsic Amplitude 23.625 mV   Lead Channel Pacing Threshold Amplitude 0.875 V   Lead Channel Pacing Threshold Pulse Width 0.4 ms   Battery Status RRT    Battery Remaining Longevity 1 mo   Battery Voltage 2.83 V   Brady Statistic RA Percent Paced 0 %   Brady Statistic RV Percent Paced 99.25 %   Brady Statistic AP VP Percent 0 %   Brady Statistic AS VP Percent 99.02 %   Brady Statistic AP VS Percent 0 %  Brady Statistic AS VS Percent 0.98 %  CUP PACEART INCLINIC DEVICE CHECK     Status: None   Collection Time: 02/05/23  7:04 PM  Result Value Ref Range   Date Time Interrogation Session [id number]    Pulse Generator Manufacturer MERM    Pulse Gen Model A2DR01 Advisa DR MRI    Pulse Gen Serial Number [id number]    Clinic Name [facility]    Implantable Pulse Generator Type Implantable Pulse Generator    Implantable Pulse Generator Implant Date 86578469    Implantable Lead Manufacturer MERM    Implantable Lead Model 5076 CapSureFix Novus    Implantable Lead Serial Number [id number]    Implantable Lead Implant Date 44010272    Implantable Lead Location Detail 1 UNKNOWN    Implantable Lead Location [id number]     Implantable Lead Connection Status [id number]    Implantable Lead Manufacturer MERM    Implantable Lead Model 5076 CapSureFix Novus    Implantable Lead Serial Number [id number]    Implantable Lead Implant Date 42595638    Implantable Lead Location Detail 1 UNKNOWN    Implantable Lead Location [id number]    Implantable Lead Connection Status [id number]    Lead Channel Setting Sensing Sensitivity 2.8 mV   Lead Channel Setting Pacing Pulse Width 0.4 ms   Lead Channel Setting Pacing Amplitude 2.5 V   Zone Setting Status 755011    Zone Setting Status [phone]    Lead Channel Impedance Value 475 ohm   Lead Channel Impedance Value 361 ohm   Lead Channel Sensing Intrinsic Amplitude 4.25 mV   Lead Channel Sensing Intrinsic Amplitude 2.625 mV   Lead Channel Pacing Threshold Amplitude 0.5 V   Lead Channel Pacing Threshold Pulse Width 0.4 ms   Lead Channel Impedance Value 456 ohm   Lead Channel Impedance Value 361 ohm   Lead Channel Sensing Intrinsic Amplitude 20.25 mV   Lead Channel Sensing Intrinsic Amplitude 20.25 mV   Lead Channel Pacing Threshold Amplitude 1.0 V   Lead Channel Pacing Threshold Pulse Width 0.4 ms   Lead Channel Pacing Threshold Amplitude 0.875 V   Lead Channel Pacing Threshold Pulse Width 0.4 ms   Battery Status RRT    Battery Remaining Longevity 1 mo   Battery Voltage 2.82 V   Brady Statistic RA Percent Paced 0 %   Brady Statistic RV Percent Paced 96.2 %   Brady Statistic AP VP Percent 0 %   Brady Statistic AS VP Percent 95.15 %   Brady Statistic AP VS Percent 0 %   Brady Statistic AS VS Percent 4.85 %   Eval Rhythm AFIB/ VP 30   Glucose, capillary     Status: Abnormal   Collection Time: 02/18/23  3:03 PM  Result Value Ref Range   Glucose-Capillary 190 (H) 70 - 99 mg/dL    Comment: Glucose reference range applies only to samples taken after fasting for at least 8 hours.  CUP PACEART INCLINIC DEVICE CHECK     Status: None   Collection Time: 03/04/23 12:46 PM  Result Value Ref  Range   Date Time Interrogation Session [id number]    Pulse Generator Manufacturer MERM    Pulse Gen Model W1DR01 Azure XT DR MRI    Pulse Gen Serial Number [id number]    Clinic Name [facility]    Implantable Pulse Generator Type Implantable Pulse Generator    Implantable Pulse Generator Implant Date 16010932    Implantable Lead Manufacturer [facility]    Implantable Lead Model [id number]  CapSureFix Novus    Implantable Lead Serial Number [id number]    Implantable Lead Implant Date 16109604    Implantable Lead Location Detail 1 UNKNOWN    Implantable Lead Location [id number]    Implantable Lead Connection Status [phone]    Implantable Lead Manufacturer [facility]    Implantable Lead Model 5076 CapSureFix Novus    Implantable Lead Serial Number [id number]    Implantable Lead Implant Date 21308657    Implantable Lead Location Detail 1 UNKNOWN    Implantable Lead Location [id number]    Implantable Lead Connection Status 846962    Lead Channel Setting Sensing Sensitivity 2.8 mV   Lead Channel Setting Pacing Pulse Width 0.4 ms   Lead Channel Setting Pacing Amplitude 2 V   Zone Setting Status 755011    Zone Setting Status Active    Lead Channel Impedance Value 608 ohm   Lead Channel Impedance Value 361 ohm   Lead Channel Sensing Intrinsic Amplitude 2.5 mV   Lead Channel Sensing Intrinsic Amplitude 3 mV   Lead Channel Impedance Value 456 ohm   Lead Channel Impedance Value 342 ohm   Lead Channel Pacing Threshold Amplitude 0.875 V   Lead Channel Pacing Threshold Pulse Width 0.4 ms   Battery Status OK    Battery Remaining Longevity 153 mo   Battery Voltage 3.23 V   Brady Statistic RA Percent Paced 0 %   Brady Statistic RV Percent Paced 97.88 %   Brady Statistic AP VP Percent 0 %   Brady Statistic AS VP Percent 97.56 %   Brady Statistic AP VS Percent 0 %   Brady Statistic AS VS Percent 2.44 %  HgB A1c     Status: Abnormal   Collection Time: 03/11/23  2:32 PM  Result Value Ref Range   Hemoglobin A1C  8.2 (A) 4.0 - 5.6 %   HbA1c POC (<> result, manual entry)     HbA1c, POC (prediabetic range)     HbA1c, POC (controlled diabetic range)        Latest Ref Rng & Units 02/05/2022    2:08 PM 12/03/2021   12:00 AM 06/25/2020   12:00 AM  CMP  Glucose 70 - 99 mg/dL 952     BUN 8 - 23 mg/dL 17  13  25       Creatinine 0.61 - 1.24 mg/dL 8.41  1.7  1.6      Sodium 135 - 145 mmol/L 137  137    Potassium 3.5 - 5.1 mmol/L 4.5  4.7    Chloride 98 - 111 mmol/L 102  102    CO2 22 - 32 mmol/L 27  28    Calcium 8.9 - 10.3 mg/dL 8.9  8.8  9.2      Total Protein 6.5 - 8.1 g/dL 6.8     Total Bilirubin 0.3 - 1.2 mg/dL 1.2     Alkaline Phos 38 - 126 U/L 63  74    AST 15 - 41 U/L 16  18    ALT 0 - 44 U/L 14  20       This result is from an external source.     Assessment & Plan:   1) Type 2 diabetes mellitus with stage 3 chronic kidney disease, without long-term current use of insulin (HCC)  - Patient has currently uncontrolled symptomatic type 2 DM since 77 years of age.  He presents today, accompanied by his wife, with his CGM showing improved glycemic profile yet still above target.  His  POCT A1c today is 8.2%, improving from last visit of 9.3%.  Analysis of his CGM shows TIR 30%, TAR 70%, TBR 0% with a GMI of 8.3%.  He has done better with injecting his Lantus but still sometimes forgets his Novolog.  He recently had ICD replaced.  He reports significant leg swelling.  -his diabetes is complicated by stage 3 renal insufficiency (improving), obesity/sedentary life, neuropathy, cardiomyopathy, chronic heavy smoking, inadequate insurance and JAVAD KIO remains at a high risk for more acute and chronic complications which include CAD, CVA, CKD, retinopathy, and neuropathy. These are all discussed in detail with the patient.  - Nutritional counseling repeated at each appointment due to patients tendency to fall back in to old habits.  - The patient admits there is a room for improvement in their  diet and drink choices. -  Suggestion is made for the patient to avoid simple carbohydrates from their diet including Cakes, Sweet Desserts / Pastries, Ice Cream, Soda (diet and regular), Sweet Tea, Candies, Chips, Cookies, Sweet Pastries, Store Bought Juices, Alcohol in Excess of 1-2 drinks a day, Artificial Sweeteners, Coffee Creamer, and "Sugar-free" Products. This will help patient to have stable blood glucose profile and potentially avoid unintended weight gain.   - I encouraged the patient to switch to unprocessed or minimally processed complex starch and increased protein intake (animal or plant source), fruits, and vegetables.   - Patient is advised to stick to a routine mealtimes to eat 3 meals a day and avoid unnecessary snacks (to snack only to correct hypoglycemia).  - I have approached him with the following individualized plan to manage diabetes and patient agrees:   - Based on his glucose profile, he will continue to need intensive treatment with higher dose of basal/bolus insulin in order for him to achieve control of diabetes to target.    -He is advised to continue his Lantus at 70 units at supper (wife agreed to help remind him) and be more consistent with his Novolog 20-26 units TID with meals if glucose is above 90 and he is eating (Specific instructions on how to titrate insulin dosage based on glucose readings given to patient in writing).    -He is encouraged to continue using his CGM to monitor blood glucose 4 times per day, before meals and at bedtime and report to the clinic if blood glucose levels are less than 70 or greater than 200 for 3 tests in a row.  - He is not a candidate for metformin therapy due to CKD.  - He is not a candidate for incretin therapy (such as Ozempic) due to increased risk of pancreatitis r/t hypertriglyceridemia and heavy smoking history.  He asked again about starting this medication today.  I advised him to quit smoking and I would consider it,  but right now due to high triglycerides and smoking history, he would be high risk for pancreatitis.  2) Lipids/HPL:  His recent lipid panel from 06/26/22 shows controlled LDL of 26 and significantly elevated triglycerides of 494.  He is advised to continue Cholestyramine 4 g TID with meals and Simvastatin 40 mg po daily at bedtime.  Side effects and precautions discussed with him.  He is advised to avoid fried foods and butter.  3) Hypertension His blood pressure is controlled to target for his age.  He is advised to continue Coreg 6.25 mg po twice daily, Lasix 40 mg po every other day (alternating with 20 mg), and Entresto 24-26 mg po twice  daily.   4) Chronic Care/Health Maintenance: -he is on ACEI/ARB and Statin medications and  is encouraged to continue to follow up with Ophthalmology, Dentist,  Podiatrist at least yearly or according to recommendations. - I have recommended yearly flu vaccine and pneumonia vaccination at least every 5 years; and  sleep for at least 7 hours a day.  The patient was counseled on the dangers of tobacco use, and was advised to quit.  Reviewed strategies to maximize success, including removing cigarettes and smoking materials from environment.  5) Weight management:  His Body mass index is 38.25 kg/m. He is a candidate for modest weight loss.  Exercise regimen and carbs restrictions were detailed with him.   - I advised patient to maintain close follow up with Ignatius Specking, MD for primary care needs.  I did encourage him to follow up with his PCP/cardiology/nephrology due to worsening swelling in his legs (weeping).      I spent  42  minutes in the care of the patient today including review of labs from CMP, Lipids, Thyroid Function, Hematology (current and previous including abstractions from other facilities); face-to-face time discussing  his blood glucose readings/logs, discussing hypoglycemia and hyperglycemia episodes and symptoms, medications doses,  his options of short and long term treatment based on the latest standards of care / guidelines;  discussion about incorporating lifestyle medicine;  and documenting the encounter. Risk reduction counseling performed per USPSTF guidelines to reduce obesity and cardiovascular risk factors.     Please refer to Patient Instructions for Blood Glucose Monitoring and Insulin/Medications Dosing Guide"  in media tab for additional information. Please  also refer to " Patient Self Inventory" in the Media  tab for reviewed elements of pertinent patient history.  Fredrik Cove participated in the discussions, expressed understanding, and voiced agreement with the above plans.  All questions were answered to his satisfaction. he is encouraged to contact clinic should he have any questions or concerns prior to his return visit.   Follow up plan: - Return in about 3 months (around 06/10/2023) for Diabetes F/U with A1c in office, No previsit labs, Bring meter and logs.    Ronny Bacon, [facility] [address] [place], [place] [address] Phone: [phone] Fax: [phone]   03/11/2023, 4:06 PM

## 2023-03-15 ENCOUNTER — Other Ambulatory Visit: Payer: Self-pay | Admitting: Nurse Practitioner

## 2023-03-15 DIAGNOSIS — Z794 Long term (current) use of insulin: Secondary | ICD-10-CM

## 2023-03-15 DIAGNOSIS — E1122 Type 2 diabetes mellitus with diabetic chronic kidney disease: Secondary | ICD-10-CM

## 2023-03-15 DIAGNOSIS — N184 Chronic kidney disease, stage 4 (severe): Secondary | ICD-10-CM

## 2023-03-29 DIAGNOSIS — Z79899 Other long term (current) drug therapy: Secondary | ICD-10-CM | POA: Diagnosis not present

## 2023-03-29 DIAGNOSIS — E7800 Pure hypercholesterolemia, unspecified: Secondary | ICD-10-CM | POA: Diagnosis not present

## 2023-03-29 DIAGNOSIS — I10 Essential (primary) hypertension: Secondary | ICD-10-CM | POA: Diagnosis not present

## 2023-03-29 DIAGNOSIS — Z2821 Immunization not carried out because of patient refusal: Secondary | ICD-10-CM | POA: Diagnosis not present

## 2023-03-29 DIAGNOSIS — R52 Pain, unspecified: Secondary | ICD-10-CM | POA: Diagnosis not present

## 2023-03-29 DIAGNOSIS — Z299 Encounter for prophylactic measures, unspecified: Secondary | ICD-10-CM | POA: Diagnosis not present

## 2023-03-29 DIAGNOSIS — R5383 Other fatigue: Secondary | ICD-10-CM | POA: Diagnosis not present

## 2023-03-29 DIAGNOSIS — E1165 Type 2 diabetes mellitus with hyperglycemia: Secondary | ICD-10-CM | POA: Diagnosis not present

## 2023-03-29 DIAGNOSIS — Z0000 Encounter for general adult medical examination without abnormal findings: Secondary | ICD-10-CM | POA: Diagnosis not present

## 2023-04-27 ENCOUNTER — Other Ambulatory Visit: Payer: Self-pay | Admitting: Nurse Practitioner

## 2023-05-03 DIAGNOSIS — N1832 Chronic kidney disease, stage 3b: Secondary | ICD-10-CM | POA: Diagnosis not present

## 2023-05-03 DIAGNOSIS — N189 Chronic kidney disease, unspecified: Secondary | ICD-10-CM | POA: Diagnosis not present

## 2023-05-07 DIAGNOSIS — E1122 Type 2 diabetes mellitus with diabetic chronic kidney disease: Secondary | ICD-10-CM | POA: Diagnosis not present

## 2023-05-07 DIAGNOSIS — N1832 Chronic kidney disease, stage 3b: Secondary | ICD-10-CM | POA: Diagnosis not present

## 2023-05-07 DIAGNOSIS — E1129 Type 2 diabetes mellitus with other diabetic kidney complication: Secondary | ICD-10-CM | POA: Diagnosis not present

## 2023-05-07 DIAGNOSIS — R809 Proteinuria, unspecified: Secondary | ICD-10-CM | POA: Diagnosis not present

## 2023-05-13 ENCOUNTER — Ambulatory Visit (INDEPENDENT_AMBULATORY_CARE_PROVIDER_SITE_OTHER): Payer: PPO

## 2023-05-13 DIAGNOSIS — I442 Atrioventricular block, complete: Secondary | ICD-10-CM

## 2023-05-14 ENCOUNTER — Telehealth: Payer: Self-pay

## 2023-05-14 LAB — CUP PACEART REMOTE DEVICE CHECK
Battery Remaining Longevity: 138 mo
Battery Voltage: 3.21 V
Brady Statistic AP VP Percent: 0 %
Brady Statistic AP VS Percent: 0 %
Brady Statistic AS VP Percent: 98.81 %
Brady Statistic AS VS Percent: 1.23 %
Brady Statistic RA Percent Paced: 0 %
Brady Statistic RV Percent Paced: 98.87 %
Date Time Interrogation Session: 20241107150643
Implantable Lead Connection Status: 753985
Implantable Lead Connection Status: 753985
Implantable Lead Implant Date: 20161115
Implantable Lead Implant Date: 20161115
Implantable Lead Location: 753859
Implantable Lead Location: 753860
Implantable Lead Model: 5076
Implantable Lead Model: 5076
Implantable Pulse Generator Implant Date: 20240815
Lead Channel Impedance Value: 304 Ohm
Lead Channel Impedance Value: 323 Ohm
Lead Channel Impedance Value: 418 Ohm
Lead Channel Impedance Value: 532 Ohm
Lead Channel Pacing Threshold Amplitude: 1.125 V
Lead Channel Pacing Threshold Pulse Width: 0.4 ms
Lead Channel Sensing Intrinsic Amplitude: 11 mV
Lead Channel Sensing Intrinsic Amplitude: 11 mV
Lead Channel Sensing Intrinsic Amplitude: 2.125 mV
Lead Channel Sensing Intrinsic Amplitude: 2.125 mV
Lead Channel Setting Pacing Amplitude: 2.25 V
Lead Channel Setting Pacing Pulse Width: 0.4 ms
Lead Channel Setting Sensing Sensitivity: 2.8 mV
Zone Setting Status: 755011

## 2023-05-14 NOTE — Telephone Encounter (Signed)
AF/AFL in progress from 10/18, controlled V rates, Eliquis per PA report, burden 90.9%  Patient reports he is not doing well.  Increased SOB, signs of fluid retention and progressing fatigue and weakness.  He is also reporting acute cough and chest congestion (hx of COPD).  Reports significant progression of sx's over past 2 weeks.  Says he "thinks he picked up something from the hospital".  I have encouraged him to reach out to his PCP, unsure if he will follow through.   CC: to Dr. Diona Browner and Dr. Nelly Laurence for any further guidance.   Continues to take his medications as prescribed.

## 2023-05-21 ENCOUNTER — Encounter: Payer: PPO | Admitting: Cardiovascular Disease

## 2023-05-23 ENCOUNTER — Other Ambulatory Visit: Payer: Self-pay | Admitting: Nurse Practitioner

## 2023-06-01 NOTE — Progress Notes (Signed)
Remote pacemaker transmission.   

## 2023-06-08 DIAGNOSIS — F419 Anxiety disorder, unspecified: Secondary | ICD-10-CM | POA: Diagnosis not present

## 2023-06-08 DIAGNOSIS — Z299 Encounter for prophylactic measures, unspecified: Secondary | ICD-10-CM | POA: Diagnosis not present

## 2023-06-08 DIAGNOSIS — F17210 Nicotine dependence, cigarettes, uncomplicated: Secondary | ICD-10-CM | POA: Diagnosis not present

## 2023-06-08 DIAGNOSIS — I10 Essential (primary) hypertension: Secondary | ICD-10-CM | POA: Diagnosis not present

## 2023-06-08 DIAGNOSIS — J449 Chronic obstructive pulmonary disease, unspecified: Secondary | ICD-10-CM | POA: Diagnosis not present

## 2023-06-11 ENCOUNTER — Ambulatory Visit: Payer: PPO | Attending: Cardiovascular Disease | Admitting: Cardiovascular Disease

## 2023-06-11 ENCOUNTER — Encounter: Payer: Self-pay | Admitting: Cardiovascular Disease

## 2023-06-11 VITALS — BP 128/76 | HR 85 | Ht 69.0 in | Wt 234.2 lb

## 2023-06-11 DIAGNOSIS — I4821 Permanent atrial fibrillation: Secondary | ICD-10-CM

## 2023-06-11 DIAGNOSIS — I484 Atypical atrial flutter: Secondary | ICD-10-CM

## 2023-06-11 LAB — CUP PACEART INCLINIC DEVICE CHECK
Battery Remaining Longevity: 150 mo
Battery Voltage: 3.2 V
Brady Statistic AP VP Percent: 0 %
Brady Statistic AP VS Percent: 0 %
Brady Statistic AS VP Percent: 98.27 %
Brady Statistic AS VS Percent: 1.74 %
Brady Statistic RA Percent Paced: 0 %
Brady Statistic RV Percent Paced: 98.48 %
Date Time Interrogation Session: 20241206150729
Implantable Lead Connection Status: 753985
Implantable Lead Connection Status: 753985
Implantable Lead Implant Date: 20161115
Implantable Lead Implant Date: 20161115
Implantable Lead Location: 753859
Implantable Lead Location: 753860
Implantable Lead Model: 5076
Implantable Lead Model: 5076
Implantable Pulse Generator Implant Date: 20240815
Lead Channel Impedance Value: 361 Ohm
Lead Channel Impedance Value: 361 Ohm
Lead Channel Impedance Value: 456 Ohm
Lead Channel Impedance Value: 608 Ohm
Lead Channel Pacing Threshold Amplitude: 1 V
Lead Channel Pacing Threshold Pulse Width: 0.4 ms
Lead Channel Sensing Intrinsic Amplitude: 1 mV
Lead Channel Sensing Intrinsic Amplitude: 1.125 mV
Lead Channel Sensing Intrinsic Amplitude: 7 mV
Lead Channel Sensing Intrinsic Amplitude: 8.125 mV
Lead Channel Setting Pacing Amplitude: 2 V
Lead Channel Setting Pacing Pulse Width: 0.4 ms
Lead Channel Setting Sensing Sensitivity: 2.8 mV
Zone Setting Status: 755011

## 2023-06-11 NOTE — Progress Notes (Signed)
    PCP: Ignatius Specking, MD Primary Cardiologist: Dr Diona Browner Primary EP:  Dr Leitha Schuller Dylan Tyler is a 77 y.o. male who presents today for routine electrophysiology followup.  Since last being seen in our clinic, the patient reports doing reasonably well.  He is not active.  + dependant edema.  + chronic SOB with activity.    He underwent generator change of a Medtronic dual-chamber pacemaker on February 18, 2023.  Today, he denies symptoms of palpitations, chest pain,  dizziness, presyncope, or syncope. he has no device related complaints -- no new tenderness, drainage, redness. The patient is otherwise without complaint today.     Physical Exam: Vitals:   06/11/23 1007  BP: 128/76  Pulse: 85  SpO2: 94%  Weight: 234 lb 3.2 oz (106.2 kg)  Height: 5\' 9"  (1.753 m)     Gen: Appears comfortable, well-nourished CV: RRR, no dependent edema The device site is normal -- no tenderness, edema, drainage, redness, threatened erosion. Pulm: breathing easily   Pacemaker interrogation- reviewed in detail today,  See PACEART report   Assessment and Plan:  Symptomatic complete heart block Normal pacemaker function  See Pace Art report No changes today he is device dependant today  Permanent afib Rate controlled On eliquis Labs from 05/03/2023 reviewed  VT:  asymptomatic, lasted < 1 minute.  No recurrence detected by device -Continue metoprolol XL 50 daily  HTN Stable No change required today  Obesity Body mass index is 34.59 kg/m. Lifestyle modification advised  Nonischemic CM (chronic) Ef has recovered   Tobacco    [name], MD 06/11/2023 10:44 AM

## 2023-06-11 NOTE — Patient Instructions (Addendum)
Medication Instructions:  Continue all current medications.  Labwork: none  Testing/Procedures: none  Follow-Up: 1 year   Any Other Special Instructions Will Be Listed Below (If Applicable).  If you need a refill on your cardiac medications before your next appointment, please call your pharmacy.  

## 2023-06-16 ENCOUNTER — Ambulatory Visit: Payer: PPO | Admitting: Nurse Practitioner

## 2023-06-16 DIAGNOSIS — I10 Essential (primary) hypertension: Secondary | ICD-10-CM

## 2023-06-16 DIAGNOSIS — E782 Mixed hyperlipidemia: Secondary | ICD-10-CM

## 2023-06-16 DIAGNOSIS — Z794 Long term (current) use of insulin: Secondary | ICD-10-CM

## 2023-06-16 DIAGNOSIS — E1122 Type 2 diabetes mellitus with diabetic chronic kidney disease: Secondary | ICD-10-CM

## 2023-06-21 ENCOUNTER — Ambulatory Visit: Payer: PPO | Admitting: Nurse Practitioner

## 2023-06-21 DIAGNOSIS — E782 Mixed hyperlipidemia: Secondary | ICD-10-CM

## 2023-06-21 DIAGNOSIS — E1122 Type 2 diabetes mellitus with diabetic chronic kidney disease: Secondary | ICD-10-CM

## 2023-06-21 DIAGNOSIS — F17200 Nicotine dependence, unspecified, uncomplicated: Secondary | ICD-10-CM

## 2023-06-21 DIAGNOSIS — Z794 Long term (current) use of insulin: Secondary | ICD-10-CM

## 2023-06-21 DIAGNOSIS — I10 Essential (primary) hypertension: Secondary | ICD-10-CM

## 2023-07-15 ENCOUNTER — Other Ambulatory Visit: Payer: Self-pay | Admitting: Cardiology

## 2023-07-20 ENCOUNTER — Other Ambulatory Visit: Payer: Self-pay | Admitting: Cardiology

## 2023-07-26 ENCOUNTER — Other Ambulatory Visit: Payer: Self-pay | Admitting: Nurse Practitioner

## 2023-07-26 DIAGNOSIS — E1122 Type 2 diabetes mellitus with diabetic chronic kidney disease: Secondary | ICD-10-CM

## 2023-08-05 DIAGNOSIS — R809 Proteinuria, unspecified: Secondary | ICD-10-CM | POA: Diagnosis not present

## 2023-08-09 DIAGNOSIS — E1122 Type 2 diabetes mellitus with diabetic chronic kidney disease: Secondary | ICD-10-CM | POA: Diagnosis not present

## 2023-08-09 DIAGNOSIS — N1832 Chronic kidney disease, stage 3b: Secondary | ICD-10-CM | POA: Diagnosis not present

## 2023-08-09 DIAGNOSIS — D472 Monoclonal gammopathy: Secondary | ICD-10-CM | POA: Diagnosis not present

## 2023-08-09 DIAGNOSIS — R809 Proteinuria, unspecified: Secondary | ICD-10-CM | POA: Diagnosis not present

## 2023-08-12 ENCOUNTER — Ambulatory Visit (INDEPENDENT_AMBULATORY_CARE_PROVIDER_SITE_OTHER): Payer: PPO

## 2023-08-12 DIAGNOSIS — I442 Atrioventricular block, complete: Secondary | ICD-10-CM

## 2023-08-12 LAB — CUP PACEART REMOTE DEVICE CHECK
Battery Remaining Longevity: 147 mo
Battery Voltage: 3.18 V
Brady Statistic RA Percent Paced: 0 %
Brady Statistic RV Percent Paced: 97.57 %
Date Time Interrogation Session: 20250205204451
Implantable Lead Connection Status: 753985
Implantable Lead Connection Status: 753985
Implantable Lead Implant Date: 20161115
Implantable Lead Implant Date: 20161115
Implantable Lead Location: 753859
Implantable Lead Location: 753860
Implantable Lead Model: 5076
Implantable Lead Model: 5076
Implantable Pulse Generator Implant Date: 20240815
Lead Channel Impedance Value: 323 Ohm
Lead Channel Impedance Value: 342 Ohm
Lead Channel Impedance Value: 437 Ohm
Lead Channel Impedance Value: 532 Ohm
Lead Channel Pacing Threshold Amplitude: 1 V
Lead Channel Pacing Threshold Pulse Width: 0.4 ms
Lead Channel Sensing Intrinsic Amplitude: 1.375 mV
Lead Channel Sensing Intrinsic Amplitude: 1.375 mV
Lead Channel Sensing Intrinsic Amplitude: 10.375 mV
Lead Channel Sensing Intrinsic Amplitude: 10.375 mV
Lead Channel Setting Pacing Amplitude: 2 V
Lead Channel Setting Pacing Pulse Width: 0.4 ms
Lead Channel Setting Sensing Sensitivity: 2.8 mV
Zone Setting Status: 755011

## 2023-08-19 ENCOUNTER — Other Ambulatory Visit: Payer: Self-pay | Admitting: Nurse Practitioner

## 2023-09-03 DIAGNOSIS — R52 Pain, unspecified: Secondary | ICD-10-CM | POA: Diagnosis not present

## 2023-09-03 DIAGNOSIS — F17210 Nicotine dependence, cigarettes, uncomplicated: Secondary | ICD-10-CM | POA: Diagnosis not present

## 2023-09-03 DIAGNOSIS — Z299 Encounter for prophylactic measures, unspecified: Secondary | ICD-10-CM | POA: Diagnosis not present

## 2023-09-03 DIAGNOSIS — Z79899 Other long term (current) drug therapy: Secondary | ICD-10-CM | POA: Diagnosis not present

## 2023-09-03 DIAGNOSIS — I700 Atherosclerosis of aorta: Secondary | ICD-10-CM | POA: Diagnosis not present

## 2023-09-03 DIAGNOSIS — F419 Anxiety disorder, unspecified: Secondary | ICD-10-CM | POA: Diagnosis not present

## 2023-09-03 DIAGNOSIS — I4891 Unspecified atrial fibrillation: Secondary | ICD-10-CM | POA: Diagnosis not present

## 2023-09-03 DIAGNOSIS — I10 Essential (primary) hypertension: Secondary | ICD-10-CM | POA: Diagnosis not present

## 2023-09-03 DIAGNOSIS — M459 Ankylosing spondylitis of unspecified sites in spine: Secondary | ICD-10-CM | POA: Diagnosis not present

## 2023-09-16 NOTE — Progress Notes (Signed)
 Remote pacemaker transmission.

## 2023-09-22 ENCOUNTER — Encounter: Payer: Self-pay | Admitting: Nurse Practitioner

## 2023-09-22 ENCOUNTER — Ambulatory Visit: Payer: PPO | Admitting: Nurse Practitioner

## 2023-09-22 VITALS — BP 112/62 | HR 68 | Ht 69.0 in | Wt 242.8 lb

## 2023-09-22 DIAGNOSIS — I10 Essential (primary) hypertension: Secondary | ICD-10-CM | POA: Diagnosis not present

## 2023-09-22 DIAGNOSIS — Z794 Long term (current) use of insulin: Secondary | ICD-10-CM

## 2023-09-22 DIAGNOSIS — F17200 Nicotine dependence, unspecified, uncomplicated: Secondary | ICD-10-CM | POA: Diagnosis not present

## 2023-09-22 DIAGNOSIS — E782 Mixed hyperlipidemia: Secondary | ICD-10-CM | POA: Diagnosis not present

## 2023-09-22 DIAGNOSIS — E1122 Type 2 diabetes mellitus with diabetic chronic kidney disease: Secondary | ICD-10-CM

## 2023-09-22 DIAGNOSIS — N1832 Chronic kidney disease, stage 3b: Secondary | ICD-10-CM

## 2023-09-22 LAB — POCT GLYCOSYLATED HEMOGLOBIN (HGB A1C): Hemoglobin A1C: 9.3 % — AB (ref 4.0–5.6)

## 2023-09-22 MED ORDER — INSULIN LISPRO (1 UNIT DIAL) 100 UNIT/ML (KWIKPEN): 16.0000 [IU] | PEN_INJECTOR | Freq: Three times a day (TID) | SUBCUTANEOUS | 3 refills | Status: DC

## 2023-09-22 MED ORDER — LANTUS SOLOSTAR 100 UNIT/ML ~~LOC~~ SOPN: 70.0000 [IU] | PEN_INJECTOR | Freq: Every day | SUBCUTANEOUS | 3 refills | Status: DC

## 2023-09-22 NOTE — Progress Notes (Signed)
 09/22/2023            Endocrinology follow-up note   Subjective:    Patient ID: Dylan Tyler, male    DOB: Aug 03, 1945.  he is being seen in follow-up  for management of currently uncontrolled, complicated type 2 diabetes, hyperlipidemia, hypertension.   PMD:  Ignatius Specking, MD.   Past Medical History:  Diagnosis Date   Ankylosing spondylitis (HCC)    Anxiety    Aortic root dilatation (HCC)    AR (aortic regurgitation)    Atrial fibrillation and flutter (HCC)    Bladder cancer (HCC)    Chronic pain    Followed at [facility]   COPD (chronic obstructive pulmonary disease) (HCC)    DDD (degenerative disc disease), lumbosacral    DDD (degenerative disc disease), thoracolumbar    Degenerative cervical disc    Depression    Diabetes mellitus type II    Diverticulitis    Essential hypertension    GERD (gastroesophageal reflux disease)    History of kidney stones    Hyperlipidemia    Migraine    Nonischemic cardiomyopathy (HCC)    Cardiolite 12/09 LVEF 55%, minor luminal irregularities at cardiac catheterization   OSA on CPAP    Rheumatoid arthritis (HCC)    Symptomatic bradycardia    Medtronic Advisa L dual-chamber pacemaker  05/2015 - Dr. Elberta Fortis   Past Surgical History:  Procedure Laterality Date   CARDIAC CATHETERIZATION  08/13/11   CARDIOVERSION N/A 07/02/2015   Procedure: CARDIOVERSION;  Surgeon: Thurmon Fair, MD;  Location: [facility];  Service: Cardiovascular;  Laterality: N/A;   CATARACT EXTRACTION W/PHACO Left 02/10/2019   Procedure: CATARACT EXTRACTION PHACO AND INTRAOCULAR LENS PLACEMENT (IOC);  Surgeon: Fabio Pierce, MD;  Location: [facility] ORS;  Service: Ophthalmology;  Laterality: Left;  CDE: 7.10   CATARACT EXTRACTION W/PHACO Right 02/27/2019   Procedure: CATARACT EXTRACTION PHACO AND INTRAOCULAR LENS PLACEMENT RIGHT EYE CDE=11.84;  Surgeon: Fabio Pierce, MD;  Location: [facility];  Service: Ophthalmology;  Laterality: Right;  right   CYSTOSCOPY WITH HOLMIUM LASER  LITHOTRIPSY  1990's   EP IMPLANTABLE DEVICE N/A 05/21/2015   MDT Advisa DR MRI pacemaker implanted by Dr Elberta Fortis   KNEE ARTHROSCOPY Right ~ 1991   LAPAROSCOPIC CHOLECYSTECTOMY  ~ 2008   PPM GENERATOR CHANGEOUT N/A 02/18/2023   Procedure: PPM GENERATOR CHANGEOUT;  Surgeon: Nelly Laurence, Roberts Gaudy, MD;  Location: [facility];  Service: Cardiovascular;  Laterality: N/A;   TRANSURETHRAL RESECTION OF BLADDER TUMOR WITH GYRUS (TURBT-GYRUS)  1990's?   Social History   Socioeconomic History   Marital status: Married    Spouse name: Not on file   Number of children: Not on file   Years of education: Not on file   Highest education level: Not on file  Occupational History   Occupation: Disabled    Employer: DISABLED  Tobacco Use   Smoking status: Every Day    Current packs/day: 0.50    Average packs/day: 0.5 packs/day for 64.6 years (32.3 ttl pk-yrs)    Types: E-cigarettes, Cigarettes    Start date: 02/26/1959   Smokeless tobacco: Former    Types: Chew   Tobacco comments:    05/21/2015 "quit chewing in 1992"  Vaping Use   Vaping status: Every Day   Substances: Nicotine, Flavoring  Substance and Sexual Activity   Alcohol use: Yes    Alcohol/week: 2.0 standard drinks of alcohol    Types: 2 Cans of beer per week   Drug use: Yes  Types: Cocaine    Comment: 05/21/2015 "quit in the early 2000"   Sexual activity: Not on file  Other Topics Concern   Not on file  Social History Narrative   Divorced   No regular exercise   Social Drivers of Health   Financial Resource Strain: Not on file  Food Insecurity: Not on file  Transportation Needs: Not on file  Physical Activity: Not on file  Stress: Not on file  Social Connections: Not on file   Outpatient Encounter Medications as of 09/22/2023  Medication Sig   acetaminophen (TYLENOL) 500 MG tablet Take 500 mg by mouth every 6 (six) hours as needed (pain).    albuterol (PROVENTIL) (2.5 MG/3ML) 0.083% nebulizer solution Take 2.5 mg by  nebulization every 6 (six) hours as needed for wheezing.   albuterol (VENTOLIN HFA) 108 (90 Base) MCG/ACT inhaler Inhale 1-2 puffs into the lungs every 4 (four) hours as needed for wheezing or shortness of breath.   Alcohol Swabs 70 % PADS Use as directed   ALPRAZolam (XANAX) 0.5 MG tablet Take 0.5 mg by mouth 2 (two) times daily as needed for anxiety.   apixaban (ELIQUIS) 5 MG TABS tablet Take 1 tablet by mouth twice daily   BD PEN NEEDLE NANO 2ND GEN 32G X 4 MM MISC USE 1 NEEDLE 4 TIMES DAILY AS DIRECTED   bismuth subsalicylate (PEPTO BISMOL) 262 MG chewable tablet Chew 524 mg by mouth as needed for indigestion.   calcitRIOL (ROCALTROL) 0.25 MCG capsule Take 0.25 mcg by mouth every Monday, Wednesday, and Friday.   Cholecalciferol (VITAMIN D-3) 1000 UNITS CAPS Take 1,000 Units by mouth 2 (two) times daily.    cholestyramine light (PREVALITE) 4 g packet Take 4 g by mouth daily as needed (diarrhea).   citalopram (CELEXA) 40 MG tablet Take 40 mg by mouth 2 (two) times daily.   Continuous Blood Gluc Receiver (FREESTYLE LIBRE 2 READER) DEVI USE WITH SENSORS TO MONITOR BLOOD SUGAR.   Continuous Glucose Sensor (FREESTYLE LIBRE 2 SENSOR) MISC USE 1 SENSOR EVERY 14 DAYS   diphenhydrAMINE (BENADRYL) 25 MG tablet Take 25 mg by mouth daily as needed for allergies.   Fluticasone-Umeclidin-Vilant (TRELEGY ELLIPTA) 200-62.5-25 MCG/ACT AEPB Inhale 1 puff into the lungs daily.   furosemide (LASIX) 40 MG tablet TAKE TWO (2) TABLETS BY MOUTH THREE TIMES WEEKLY AND ONE TABLET FOUR TIMES WEEKLY.   gabapentin (NEURONTIN) 400 MG capsule Take 400-800 mg by mouth See admin instructions. Take 400 mg in the morning and 800 mg in the evening   IBU 800 MG tablet Take 800 mg by mouth every 6 (six) hours as needed for moderate pain.   Insulin Pen Needle (B-D ULTRAFINE III SHORT PEN) 31G X 8 MM MISC Use to inject insulin 4 x daily   Lancets (FREESTYLE) lancets USE TO CHECK BLOOD SUGAR FOUR TIMES DAILY.   magnesium oxide  (MAG-OX) 400 (240 Mg) MG tablet Take 1 tablet by mouth daily.   metoCLOPramide (REGLAN) 5 MG tablet Take 5 mg by mouth every 6 (six) hours as needed for nausea or vomiting.   metoprolol succinate (TOPROL-XL) 50 MG 24 hr tablet TAKE 1 TABLET BY MOUTH ONCE DAILY *STOP CARVEDILOL*   nitroGLYCERIN (NITROSTAT) 0.4 MG SL tablet DISSOLVE ONE TABLET UNDER TONGUE EVERY 5 MINUTES UP TO 3 DOSES AS NEEDED FOR CHEST PAIN (IF NO RELIEF AFTER 2ND DOSE GO TO ED OR CALL 911)   NON FORMULARY 1 each by Other route See admin instructions. CPAP - Uses nightly  sacubitril-valsartan (ENTRESTO) 24-26 MG Take 1 tablet by mouth twice daily   simvastatin (ZOCOR) 40 MG tablet TAKE 1 TABLET BY MOUTH AT BEDTIME   spironolactone (ALDACTONE) 25 MG tablet Take 25 mg by mouth daily.   traMADol (ULTRAM) 50 MG tablet Take 1 tablet by mouth every 8 (eight) hours as needed (pain).   triamcinolone cream (KENALOG) 0.1 % Apply 1 application  topically 2 (two) times daily as needed (rash).   vitamin B-12 (CYANOCOBALAMIN) 1000 MCG tablet Take 1,000 mcg by mouth 2 (two) times daily.   VOLTAREN 1 % GEL Apply 2 g topically 4 (four) times daily as needed (for pain).    [DISCONTINUED] insulin glargine (LANTUS SOLOSTAR) 100 UNIT/ML Solostar Pen Patient is to inject 70 units at supper.   [DISCONTINUED] insulin lispro (HUMALOG) 100 UNIT/ML KwikPen INJECT 20 TO 26 UNITS SUBCUTANEOUSLY THREE TIMES DAILY WITH MEALS   insulin glargine (LANTUS SOLOSTAR) 100 UNIT/ML Solostar Pen Inject 70 Units into the skin at bedtime. Patient is to inject 70 units at supper.   insulin lispro (HUMALOG) 100 UNIT/ML KwikPen Inject 16-22 Units into the skin 3 (three) times daily.   No facility-administered encounter medications on file as of 09/22/2023.    ALLERGIES: No Known Allergies  VACCINATION STATUS:  There is no immunization history on file for this patient.  Diabetes He presents for his follow-up diabetic visit. He has type 2 diabetes mellitus. Onset  time: He was diagnosed at approximate age of 72 years. His disease course has been improving. There are no hypoglycemic associated symptoms. Pertinent negatives for hypoglycemia include no confusion, hunger, pallor or seizures. Associated symptoms include foot paresthesias. Pertinent negatives for diabetes include no fatigue, no polydipsia, no polyphagia, no polyuria, no weakness and no weight loss (intentional). There are no hypoglycemic complications. Symptoms are stable. Diabetic complications include heart disease, nephropathy and peripheral neuropathy. Risk factors for coronary artery disease include dyslipidemia, diabetes mellitus, hypertension, male sex, sedentary lifestyle, tobacco exposure, family history and obesity. Current diabetic treatment includes intensive insulin program. He is compliant with treatment some of the time (falls asleep early and forgets to take Lantus). His weight is increasing steadily. He is following a generally unhealthy diet. When asked about meal planning, he reported none. He has not had a previous visit with a dietitian. He never [facility] with cane due to bilateral knee arthritis) participates in exercise. His home blood glucose trend is fluctuating dramatically. His overall blood glucose range is >200 mg/dl. (He presents today, accompanied by his wife, with his CGM showing dramatically fluctuating glycemic profile yet still above target overall.  His POCT A1c today is 9.3%, increasing from last visit of 8.2%.  Analysis of his CGM shows TIR 17%, TAR 83%, TBR 0% with a GMI of 9.5%.  He still is missing opportunities to inject his Lantus.) An ACE inhibitor/angiotensin II receptor blocker is being taken. He sees a podiatrist.Eye exam is current.  Hyperlipidemia This is a chronic problem. The current episode started more than 1 year ago. The problem is controlled. Recent lipid tests were reviewed and are normal. Exacerbating diseases include chronic renal disease, diabetes and  obesity. Factors aggravating his hyperlipidemia include smoking, fatty foods and beta blockers. Pertinent negatives include no myalgias or shortness of breath. Current antihyperlipidemic treatment includes statins. The current treatment provides mild improvement of lipids. Compliance problems include adherence to diet and adherence to exercise.  Risk factors for coronary artery disease include dyslipidemia, diabetes mellitus, hypertension, a sedentary lifestyle, male sex and obesity.  Hypertension This is a chronic problem. The current episode started more than 1 year ago. The problem has been resolved since onset. The problem is controlled. Pertinent negatives include no shortness of breath. There are no associated agents to hypertension. Risk factors for coronary artery disease include diabetes mellitus, dyslipidemia, family history, male gender, obesity, sedentary lifestyle and smoking/tobacco exposure. Past treatments include beta blockers and diuretics. The current treatment provides significant improvement. Compliance problems include diet and exercise.  Hypertensive end-organ damage includes kidney disease and CAD/MI. Identifiable causes of hypertension include chronic renal disease.    Review of systems  Constitutional: +increasing body weight,  current  Body mass index is 35.86 kg/m. , + fatigue, no subjective hyperthermia, no subjective hypothermia Eyes: no blurry vision, no xerophthalmia ENT: no sore throat, no nodules palpated in throat, no dysphagia/odynophagia, no hoarseness Cardiovascular: no Chest Pain, no Shortness of Breath, no palpitations, + BLE edema Respiratory: chronic cough (smoker), intermittent shortness of breath Gastrointestinal: no Nausea/Vomiting/Diarrhea Musculoskeletal: bilateral knee pain (under evaluation for bilateral TKR), has had multiple falls recently Skin: no rashes, no hyperemia, multiple nonhealing wounds to BLE Neurological: no tremors, no numbness, no  tingling Psychiatric: no depression, no anxiety   Objective:    BP 112/62 (BP Location: Left Arm, Patient Position: Sitting, Cuff Size: Large)   Pulse 68   Ht 5\' 9"  (1.753 m)   Wt 242 lb 12.8 oz (110.1 kg)   BMI 35.86 kg/m   Wt Readings from Last 3 Encounters:  09/22/23 242 lb 12.8 oz (110.1 kg)  06/11/23 234 lb 3.2 oz (106.2 kg)  03/11/23 259 lb (117.5 kg)    BP Readings from Last 3 Encounters:  09/22/23 112/62  06/11/23 128/76  03/11/23 134/71     Physical Exam- Limited  Constitutional:  Body mass index is 35.86 kg/m. , not in acute distress, normal state of mind Eyes:  EOMI, no exophthalmos Musculoskeletal: no gross deformities, strength intact in all four extremities, no gross restriction of joint movements Skin:  no rashes, no hyperemia Neurological: no tremor with outstretched hands   Diabetic Foot Exam - Simple   No data filed      Lipid Panel     Component Value Date/Time   CHOL 101 12/03/2021 0000   TRIG 113 12/03/2021 0000   HDL 32 (A) 12/03/2021 0000   LDLCALC 46 12/03/2021 0000   Recent Results (from the past 2160 hours)  CUP PACEART REMOTE DEVICE CHECK     Status: None   Collection Time: 08/11/23  8:44 PM  Result Value Ref Range   Date Time Interrogation Session [id number]    Pulse Generator Manufacturer MERM    Pulse Gen Model [id number] Azure XT DR MRI    Pulse Gen Serial Number [id number]    Clinic Name [facility]    Implantable Pulse Generator Type Implantable Pulse Generator    Implantable Pulse Generator Implant Date [id number]    Implantable Lead Manufacturer [facility]    Implantable Lead Model 5076 CapSureFix Novus    Implantable Lead Serial Number [id number]    Implantable Lead Implant Date [id number]    Implantable Lead Location Detail 1 UNKNOWN    Implantable Lead Location [id number]    Implantable Lead Connection Status [id number]    Implantable Lead Manufacturer [facility]    Implantable Lead Model 5076 CapSureFix Novus    Implantable Lead  Serial Number [id number]    Implantable Lead Implant Date [id number]    Implantable Lead Location Detail 1 UNKNOWN    Implantable  Lead Location [id number]    Implantable Lead Connection Status [phone]    Lead Channel Setting Sensing Sensitivity 2.8 mV   Lead Channel Setting Pacing Pulse Width 0.4 ms   Lead Channel Setting Pacing Amplitude 2 V   Zone Setting Status [id number]    Zone Setting Status Active    Lead Channel Impedance Value 532 ohm   Lead Channel Impedance Value 323 ohm   Lead Channel Sensing Intrinsic Amplitude 1.375 mV   Lead Channel Sensing Intrinsic Amplitude 1.375 mV   Lead Channel Impedance Value 437 ohm   Lead Channel Impedance Value 342 ohm   Lead Channel Sensing Intrinsic Amplitude 10.375 mV   Lead Channel Sensing Intrinsic Amplitude 10.375 mV   Lead Channel Pacing Threshold Amplitude 1 V   Lead Channel Pacing Threshold Pulse Width 0.4 ms   Battery Status OK    Battery Remaining Longevity 147 mo   Battery Voltage 3.18 V   Brady Statistic RA Percent Paced 0 %   Brady Statistic RV Percent Paced 97.57 %  HgB A1c     Status: Abnormal   Collection Time: 09/22/23  3:37 PM  Result Value Ref Range   Hemoglobin A1C 9.3 (A) 4.0 - 5.6 %   HbA1c POC (<> result, manual entry)     HbA1c, POC (prediabetic range)     HbA1c, POC (controlled diabetic range)        Latest Ref Rng & Units 02/05/2022    2:08 PM 12/03/2021   12:00 AM 06/25/2020   12:00 AM  CMP  Glucose 70 - 99 mg/dL 132     BUN 8 - 23 mg/dL 17  13  25       Creatinine 0.61 - 1.24 mg/dL 4.40  1.7  1.6      Sodium 135 - 145 mmol/L 137  137    Potassium 3.5 - 5.1 mmol/L 4.5  4.7    Chloride 98 - 111 mmol/L 102  102    CO2 22 - 32 mmol/L 27  28    Calcium 8.9 - 10.3 mg/dL 8.9  8.8  9.2      Total Protein 6.5 - 8.1 g/dL 6.8     Total Bilirubin 0.3 - 1.2 mg/dL 1.2     Alkaline Phos 38 - 126 U/L 63  74    AST 15 - 41 U/L 16  18    ALT 0 - 44 U/L 14  20       This result is from an external source.     Assessment &  Plan:   1) Type 2 diabetes mellitus with stage 3 chronic kidney disease, without long-term current use of insulin (HCC)  - Patient has currently uncontrolled symptomatic type 2 DM since 78 years of age.  He presents today, accompanied by his wife, with his CGM showing dramatically fluctuating glycemic profile yet still above target overall.  His POCT A1c today is 9.3%, increasing from last visit of 8.2%.  Analysis of his CGM shows TIR 17%, TAR 83%, TBR 0% with a GMI of 9.5%.  He still is missing opportunities to inject his Lantus.  -his diabetes is complicated by stage 3 renal insufficiency (improving), obesity/sedentary life, neuropathy, cardiomyopathy, chronic heavy smoking, inadequate insurance and Dylan Tyler remains at a high risk for more acute and chronic complications which include CAD, CVA, CKD, retinopathy, and neuropathy. These are all discussed in detail with the patient.  - Nutritional counseling repeated at each appointment due to patients tendency to  fall back in to old habits.  - The patient admits there is a room for improvement in their diet and drink choices. -  Suggestion is made for the patient to avoid simple carbohydrates from their diet including Cakes, Sweet Desserts / Pastries, Ice Cream, Soda (diet and regular), Sweet Tea, Candies, Chips, Cookies, Sweet Pastries, Store Bought Juices, Alcohol in Excess of 1-2 drinks a day, Artificial Sweeteners, Coffee Creamer, and "Sugar-free" Products. This will help patient to have stable blood glucose profile and potentially avoid unintended weight gain.   - I encouraged the patient to switch to unprocessed or minimally processed complex starch and increased protein intake (animal or plant source), fruits, and vegetables.   - Patient is advised to stick to a routine mealtimes to eat 3 meals a day and avoid unnecessary snacks (to snack only to correct hypoglycemia).  - I have approached him with the following individualized plan to  manage diabetes and patient agrees:   - Based on his glucose profile, he will continue to need intensive treatment with higher dose of basal/bolus insulin in order for him to achieve control of diabetes to target.    -He is advised to continue his Lantus at 70 units at supper (wife agreed to help remind him) and lower his Humalog to 16-22 units TID with meals if glucose is above 90 and he is eating (Specific instructions on how to titrate insulin dosage based on glucose readings given to patient in writing).  He is getting more active now that the weather is warmer.  -He is encouraged to continue using his CGM to monitor blood glucose 4 times per day, before meals and at bedtime and report to the clinic if blood glucose levels are less than 70 or greater than 200 for 3 tests in a row.  - He is not a candidate for metformin therapy due to CKD.  - He is not a candidate for incretin therapy (such as Ozempic) due to increased risk of pancreatitis r/t hypertriglyceridemia and heavy smoking history.  He asked again about starting this medication today.  I advised him to quit smoking and I would consider it, but right now due to high triglycerides and smoking history, he would be high risk for pancreatitis.  2) Lipids/HPL:  His recent lipid panel from 06/26/22 shows controlled LDL of 26 and significantly elevated triglycerides of 494.  He is advised to continue Cholestyramine 4 g TID with meals and Simvastatin 40 mg po daily at bedtime.  Side effects and precautions discussed with him.  He is advised to avoid fried foods and butter.  3) Hypertension His blood pressure is controlled to target for his age.  He is advised to continue Coreg 6.25 mg po twice daily, Lasix 40 mg po every other day (alternating with 20 mg), and Entresto 24-26 mg po twice daily.   4) Chronic Care/Health Maintenance: -he is on ACEI/ARB and Statin medications and  is encouraged to continue to follow up with Ophthalmology, Dentist,   Podiatrist at least yearly or according to recommendations. - I have recommended yearly flu vaccine and pneumonia vaccination at least every 5 years; and  sleep for at least 7 hours a day.  The patient was counseled on the dangers of tobacco use, and was advised to quit.  Reviewed strategies to maximize success, including removing cigarettes and smoking materials from environment.  5) Weight management:  His Body mass index is 35.86 kg/m. He is a candidate for modest weight loss.  Exercise  regimen and carbs restrictions were detailed with him.   - I advised patient to maintain close follow up with Ignatius Specking, MD for primary care needs.  I did encourage him to follow up with his PCP/cardiology/nephrology due to worsening swelling in his legs (weeping).      I spent  39  minutes in the care of the patient today including review of labs from CMP, Lipids, Thyroid Function, Hematology (current and previous including abstractions from other facilities); face-to-face time discussing  his blood glucose readings/logs, discussing hypoglycemia and hyperglycemia episodes and symptoms, medications doses, his options of short and long term treatment based on the latest standards of care / guidelines;  discussion about incorporating lifestyle medicine;  and documenting the encounter. Risk reduction counseling performed per USPSTF guidelines to reduce obesity and cardiovascular risk factors.     Please refer to Patient Instructions for Blood Glucose Monitoring and Insulin/Medications Dosing Guide"  in media tab for additional information. Please  also refer to " Patient Self Inventory" in the Media  tab for reviewed elements of pertinent patient history.  Dylan Tyler participated in the discussions, expressed understanding, and voiced agreement with the above plans.  All questions were answered to his satisfaction. he is encouraged to contact clinic should he have any questions or concerns prior to his  return visit.   Follow up plan: - Return in about 3 months (around 12/23/2023) for Diabetes F/U with A1c in office, No previsit labs, Bring meter and logs.    Ronny Bacon, [facility] [address] [place], [place] [address] Phone: [phone] Fax: [phone]   09/22/2023, 4:34 PM

## 2023-09-23 ENCOUNTER — Other Ambulatory Visit: Payer: Self-pay | Admitting: Nurse Practitioner

## 2023-09-23 DIAGNOSIS — N1832 Chronic kidney disease, stage 3b: Secondary | ICD-10-CM

## 2023-09-23 DIAGNOSIS — Z794 Long term (current) use of insulin: Secondary | ICD-10-CM

## 2023-09-23 MED ORDER — LANTUS SOLOSTAR 100 UNIT/ML ~~LOC~~ SOPN: 70.0000 [IU] | PEN_INJECTOR | Freq: Every day | SUBCUTANEOUS | 3 refills | Status: DC

## 2023-10-17 ENCOUNTER — Other Ambulatory Visit: Payer: Self-pay | Admitting: Nurse Practitioner

## 2023-10-18 DIAGNOSIS — I700 Atherosclerosis of aorta: Secondary | ICD-10-CM | POA: Diagnosis not present

## 2023-10-18 DIAGNOSIS — J441 Chronic obstructive pulmonary disease with (acute) exacerbation: Secondary | ICD-10-CM | POA: Diagnosis not present

## 2023-10-18 DIAGNOSIS — N183 Chronic kidney disease, stage 3 unspecified: Secondary | ICD-10-CM | POA: Diagnosis not present

## 2023-10-22 ENCOUNTER — Other Ambulatory Visit: Payer: Self-pay | Admitting: Nurse Practitioner

## 2023-10-22 DIAGNOSIS — E1122 Type 2 diabetes mellitus with diabetic chronic kidney disease: Secondary | ICD-10-CM

## 2023-10-22 DIAGNOSIS — N1832 Chronic kidney disease, stage 3b: Secondary | ICD-10-CM

## 2023-10-22 DIAGNOSIS — N184 Chronic kidney disease, stage 4 (severe): Secondary | ICD-10-CM

## 2023-10-29 DIAGNOSIS — I700 Atherosclerosis of aorta: Secondary | ICD-10-CM | POA: Diagnosis not present

## 2023-10-29 DIAGNOSIS — I4891 Unspecified atrial fibrillation: Secondary | ICD-10-CM | POA: Diagnosis not present

## 2023-10-29 DIAGNOSIS — N183 Chronic kidney disease, stage 3 unspecified: Secondary | ICD-10-CM | POA: Diagnosis not present

## 2023-10-29 DIAGNOSIS — F17210 Nicotine dependence, cigarettes, uncomplicated: Secondary | ICD-10-CM | POA: Diagnosis not present

## 2023-10-29 DIAGNOSIS — J441 Chronic obstructive pulmonary disease with (acute) exacerbation: Secondary | ICD-10-CM | POA: Diagnosis not present

## 2023-11-08 IMAGING — CT CT HIP*L* W/O CM
2 of 3 series · 17 of 46 positions shown, 19 images · non-contrast
Comparison: None.

CLINICAL DATA: Chronic left hip pain.

EXAM:
CT OF THE LEFT HIP WITHOUT CONTRAST
TECHNIQUE: Multidetector CT imaging of the left hip was performed according to
the standard protocol. Multiplanar CT image reconstructions were
also generated.

[Series 5: 3 axial soft · axial · 0.55mm/px · z∈[+749,+951]mm · 14 of 117 slices shown, 16 images]
[im 8/117  soft-tissue]
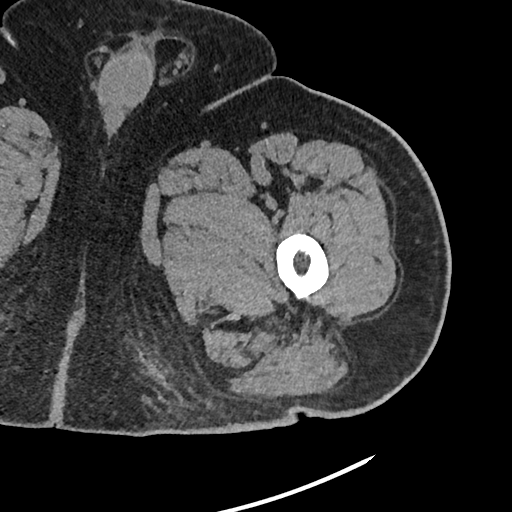
[im 8/117  bone]
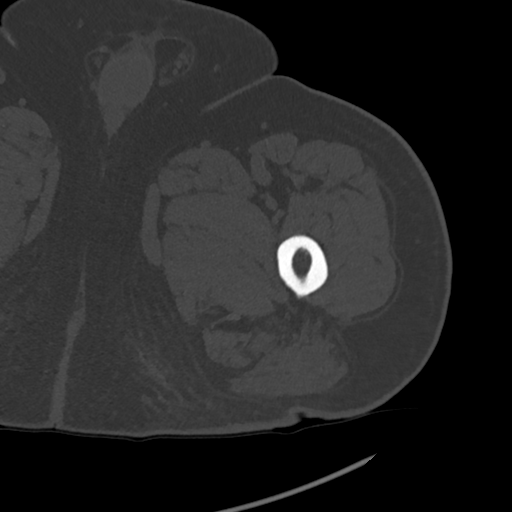
[im 15/117  soft-tissue]
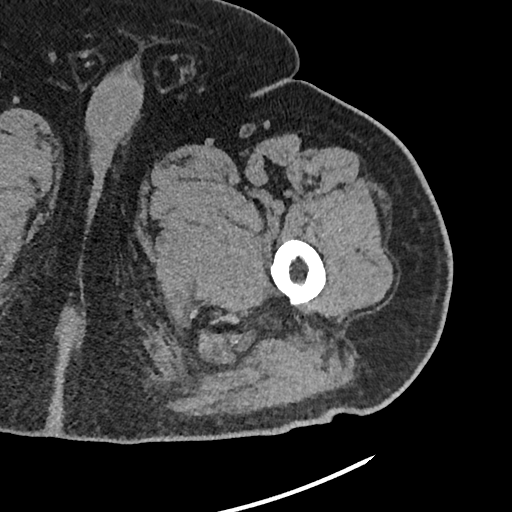
[im 23/117  soft-tissue]
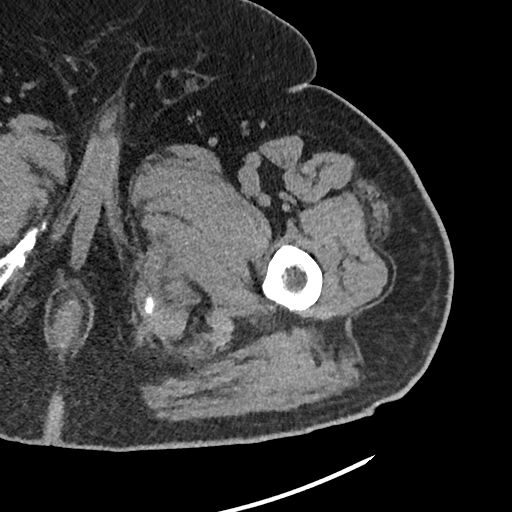
[im 30/117  soft-tissue]
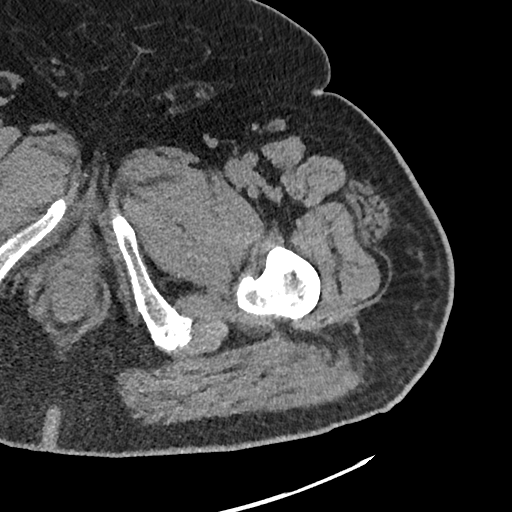
[im 38/117  soft-tissue]
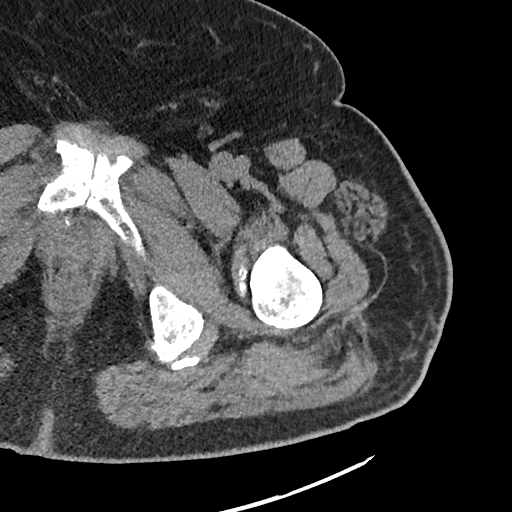
[im 45/117  soft-tissue]
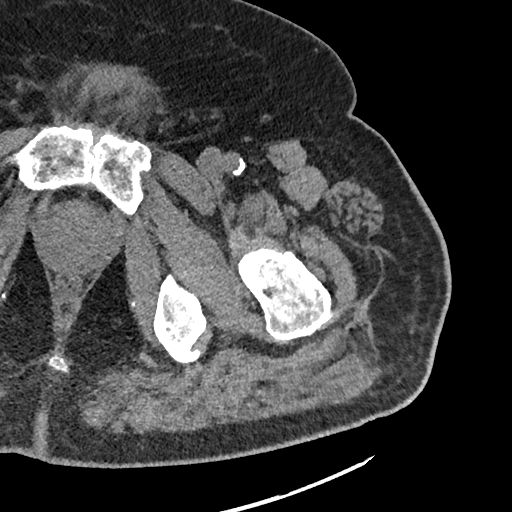
[im 53/117  soft-tissue]
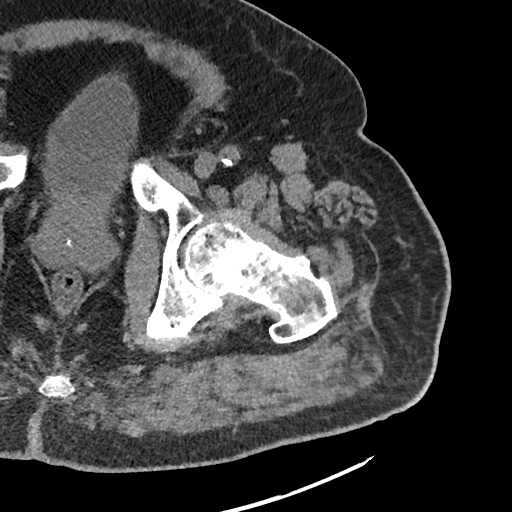
[im 64/117  soft-tissue]
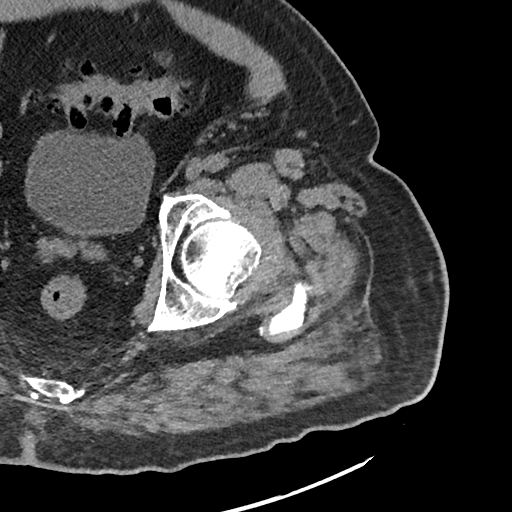
[im 72/117  soft-tissue]
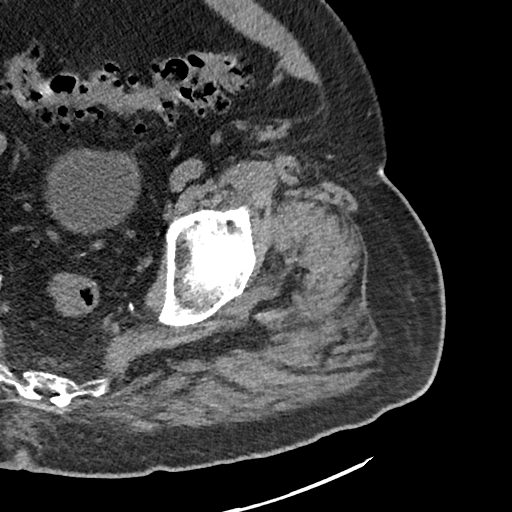
[im 72/117  bone]
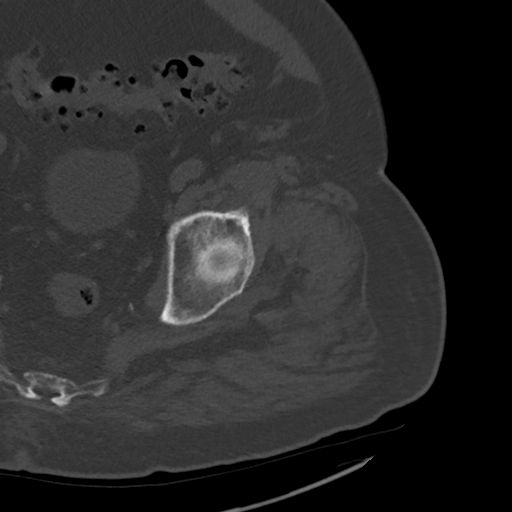
[im 79/117  soft-tissue]
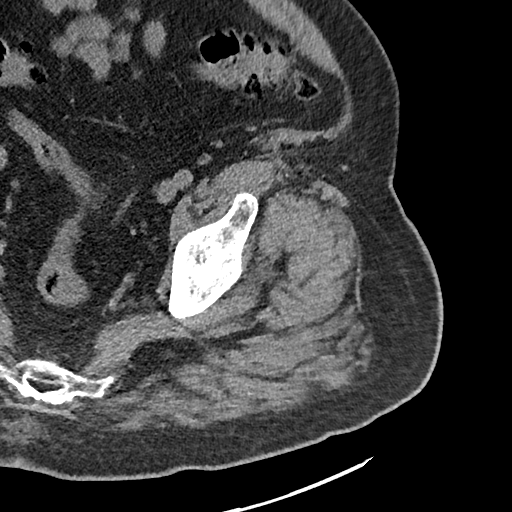
[im 87/117  soft-tissue]
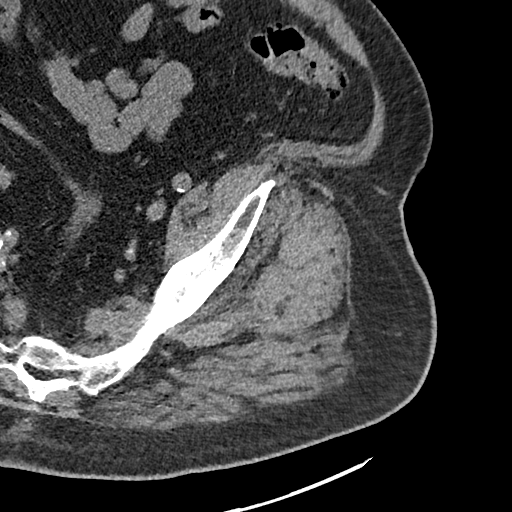
[im 94/117  soft-tissue]
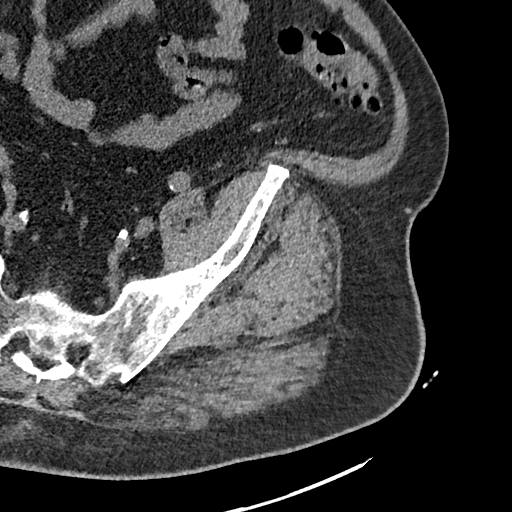
[im 102/117  soft-tissue]
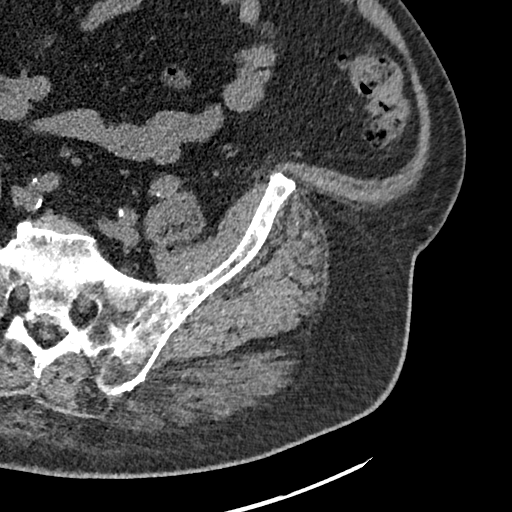
[im 109/117  soft-tissue]
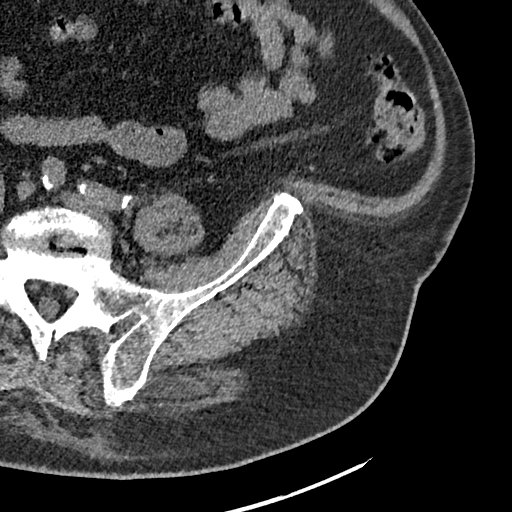

[Series 8: coronal st · coronal · 0.51mm/px · 3 of 109 slices shown]
[im 37/109  soft-tissue]
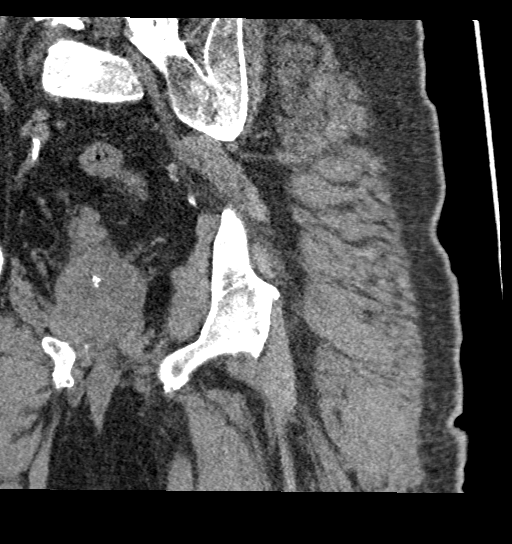
[im 49/109  soft-tissue]
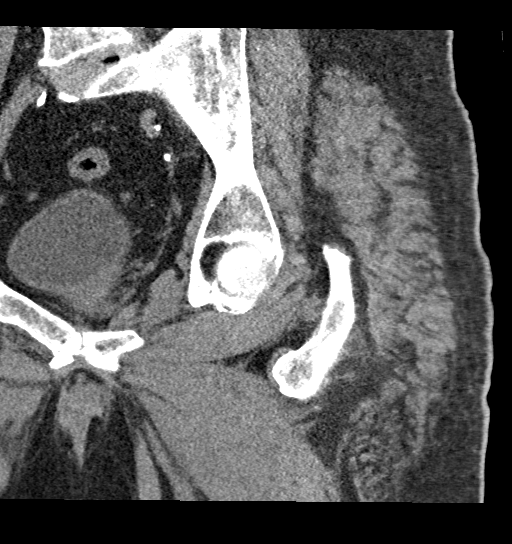
[im 61/109  soft-tissue]
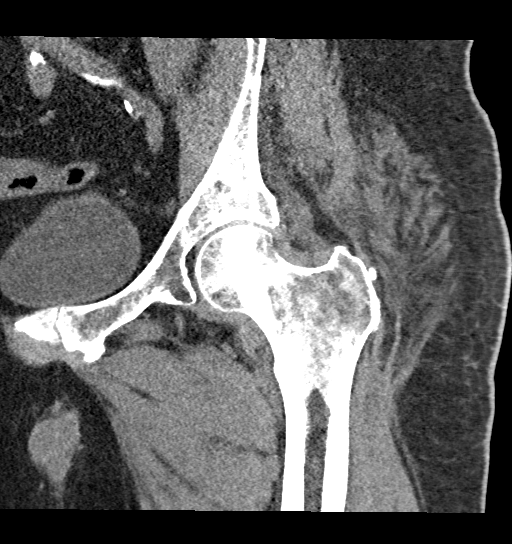

[17 of 46 positions shown; findings below may reference images not displayed]

FINDINGS: Bones/Joint/Cartilage

Moderate superior left femoroacetabular joint space narrowing.
Mild-to-moderate superolateral left acetabular degenerative
osteophytosis. No significant CAM type bump deformity of the left
femoral head-neck junction. There are 2 well corticated chronic
ossicles measuring up to 9 mm in AP dimension within the iliopsoas
tendon insertion at the lesser trochanter, likely remote
enthesopathic tug related versus partial-thickness avulsion injury
changes.

Severe pubic symphysis joint space narrowing, subchondral sclerosis,
and peripheral osteophytosis degenerative changes.

Partially visualized high-grade bilateral L5-S1 facet joint
arthropathy.

No acute fracture.

No left hip joint effusion.

Ligaments

Suboptimally assessed by CT.

Muscles and Tendons

Within normal limits this modality. No trochanteric bursitis.

Soft tissues

Moderate sigmoid diverticulosis and descending colon diverticulosis
without inflammatory changes to indicate acute diverticulitis.
IMPRESSION: Moderate left femoroacetabular osteoarthritis.

Mild chronic ossicles at the iliopsoas tendon insertion on the
lesser trochanter, likely remote enthesopathic tug related remote
partial-thickness avulsion injury changes.

## 2023-11-11 ENCOUNTER — Ambulatory Visit (INDEPENDENT_AMBULATORY_CARE_PROVIDER_SITE_OTHER): Payer: PPO

## 2023-11-11 DIAGNOSIS — I442 Atrioventricular block, complete: Secondary | ICD-10-CM | POA: Diagnosis not present

## 2023-11-11 LAB — CUP PACEART REMOTE DEVICE CHECK
Battery Remaining Longevity: 145 mo
Battery Voltage: 3.14 V
Brady Statistic AP VP Percent: 0 %
Brady Statistic AP VS Percent: 0 %
Brady Statistic AS VP Percent: 96.71 %
Brady Statistic AS VS Percent: 3.32 %
Brady Statistic RA Percent Paced: 0 %
Brady Statistic RV Percent Paced: 94.53 %
Date Time Interrogation Session: 20250508033237
Implantable Lead Connection Status: 753985
Implantable Lead Connection Status: 753985
Implantable Lead Implant Date: 20161115
Implantable Lead Implant Date: 20161115
Implantable Lead Location: 753859
Implantable Lead Location: 753860
Implantable Lead Model: 5076
Implantable Lead Model: 5076
Implantable Pulse Generator Implant Date: 20240815
Lead Channel Impedance Value: 342 Ohm
Lead Channel Impedance Value: 361 Ohm
Lead Channel Impedance Value: 437 Ohm
Lead Channel Impedance Value: 551 Ohm
Lead Channel Pacing Threshold Amplitude: 0.875 V
Lead Channel Pacing Threshold Pulse Width: 0.4 ms
Lead Channel Sensing Intrinsic Amplitude: 2.25 mV
Lead Channel Sensing Intrinsic Amplitude: 2.25 mV
Lead Channel Sensing Intrinsic Amplitude: 4.125 mV
Lead Channel Sensing Intrinsic Amplitude: 4.125 mV
Lead Channel Setting Pacing Amplitude: 2 V
Lead Channel Setting Pacing Pulse Width: 0.4 ms
Lead Channel Setting Sensing Sensitivity: 2.8 mV
Zone Setting Status: 755011

## 2023-11-15 DIAGNOSIS — D631 Anemia in chronic kidney disease: Secondary | ICD-10-CM | POA: Diagnosis not present

## 2023-11-15 DIAGNOSIS — N189 Chronic kidney disease, unspecified: Secondary | ICD-10-CM | POA: Diagnosis not present

## 2023-11-15 DIAGNOSIS — R809 Proteinuria, unspecified: Secondary | ICD-10-CM | POA: Diagnosis not present

## 2023-11-15 DIAGNOSIS — E211 Secondary hyperparathyroidism, not elsewhere classified: Secondary | ICD-10-CM | POA: Diagnosis not present

## 2023-11-17 DIAGNOSIS — N1832 Chronic kidney disease, stage 3b: Secondary | ICD-10-CM | POA: Diagnosis not present

## 2023-11-17 DIAGNOSIS — N401 Enlarged prostate with lower urinary tract symptoms: Secondary | ICD-10-CM | POA: Diagnosis not present

## 2023-11-17 DIAGNOSIS — R809 Proteinuria, unspecified: Secondary | ICD-10-CM | POA: Diagnosis not present

## 2023-11-17 DIAGNOSIS — E1122 Type 2 diabetes mellitus with diabetic chronic kidney disease: Secondary | ICD-10-CM | POA: Diagnosis not present

## 2023-11-19 ENCOUNTER — Ambulatory Visit: Payer: Self-pay | Admitting: Cardiovascular Disease

## 2023-11-21 ENCOUNTER — Other Ambulatory Visit: Payer: Self-pay | Admitting: Nurse Practitioner

## 2023-12-01 ENCOUNTER — Other Ambulatory Visit: Payer: Self-pay | Admitting: Nurse Practitioner

## 2023-12-02 DIAGNOSIS — Z299 Encounter for prophylactic measures, unspecified: Secondary | ICD-10-CM | POA: Diagnosis not present

## 2023-12-02 DIAGNOSIS — R52 Pain, unspecified: Secondary | ICD-10-CM | POA: Diagnosis not present

## 2023-12-02 DIAGNOSIS — E1165 Type 2 diabetes mellitus with hyperglycemia: Secondary | ICD-10-CM | POA: Diagnosis not present

## 2023-12-02 DIAGNOSIS — I4891 Unspecified atrial fibrillation: Secondary | ICD-10-CM | POA: Diagnosis not present

## 2023-12-02 DIAGNOSIS — I10 Essential (primary) hypertension: Secondary | ICD-10-CM | POA: Diagnosis not present

## 2023-12-02 DIAGNOSIS — R110 Nausea: Secondary | ICD-10-CM | POA: Diagnosis not present

## 2023-12-02 DIAGNOSIS — G8929 Other chronic pain: Secondary | ICD-10-CM | POA: Diagnosis not present

## 2023-12-02 DIAGNOSIS — F419 Anxiety disorder, unspecified: Secondary | ICD-10-CM | POA: Diagnosis not present

## 2023-12-06 ENCOUNTER — Other Ambulatory Visit: Payer: Self-pay | Admitting: Nurse Practitioner

## 2023-12-20 NOTE — Progress Notes (Signed)
 Remote pacemaker transmission.

## 2023-12-29 ENCOUNTER — Ambulatory Visit: Admitting: Nurse Practitioner

## 2023-12-29 ENCOUNTER — Encounter: Payer: Self-pay | Admitting: Nurse Practitioner

## 2023-12-29 VITALS — BP 114/78 | HR 77 | Ht 69.0 in | Wt 231.2 lb

## 2023-12-29 DIAGNOSIS — F17200 Nicotine dependence, unspecified, uncomplicated: Secondary | ICD-10-CM

## 2023-12-29 DIAGNOSIS — E1122 Type 2 diabetes mellitus with diabetic chronic kidney disease: Secondary | ICD-10-CM | POA: Diagnosis not present

## 2023-12-29 DIAGNOSIS — Z794 Long term (current) use of insulin: Secondary | ICD-10-CM | POA: Diagnosis not present

## 2023-12-29 DIAGNOSIS — E782 Mixed hyperlipidemia: Secondary | ICD-10-CM | POA: Diagnosis not present

## 2023-12-29 DIAGNOSIS — I10 Essential (primary) hypertension: Secondary | ICD-10-CM | POA: Diagnosis not present

## 2023-12-29 DIAGNOSIS — N1832 Chronic kidney disease, stage 3b: Secondary | ICD-10-CM | POA: Diagnosis not present

## 2023-12-29 MED ORDER — NOVOLOG FLEXPEN 100 UNIT/ML ~~LOC~~ SOPN: 16.0000 [IU] | PEN_INJECTOR | Freq: Three times a day (TID) | SUBCUTANEOUS | 3 refills | Status: DC

## 2023-12-29 MED ORDER — LANTUS SOLOSTAR 100 UNIT/ML ~~LOC~~ SOPN: 80.0000 [IU] | PEN_INJECTOR | Freq: Every day | SUBCUTANEOUS | 3 refills | Status: DC

## 2023-12-29 NOTE — Progress Notes (Signed)
 12/29/2023            Endocrinology follow-up note   Subjective:    Patient ID: Dylan Tyler, male    DOB: Apr 15, 1946.  he is being seen in follow-up  for management of currently uncontrolled, complicated type 2 diabetes, hyperlipidemia, hypertension.   PMD:  Rosamond Leta NOVAK, MD.   Past Medical History:  Diagnosis Date   Ankylosing spondylitis (HCC)    Anxiety    Aortic root dilatation (HCC)    AR (aortic regurgitation)    Atrial fibrillation and flutter (HCC)    Bladder cancer (HCC)    Chronic pain    Follow[facility] at [facility]   COPD (chronic obstructive pulmonary disease) (HCC)    DDD (degenerative disc disease), lumbosacral    DDD (degenerative disc disease), thoracolumbar    Degenerative cervical disc    Depression    Diabetes mellitus type II    Diverticulitis    Essential hypertension    GERD (gastroesophageal reflux disease)    History of kidney stones    Hyperlipidemia    Migraine    Nonischemic cardiomyopathy (HCC)    Cardiolite 12/09 LVEF 55%, minor luminal irregularities at cardiac catheterization   OSA on CPAP    Rheumatoid arthritis (HCC)    Symptomatic bradycardia    Medtronic Advisa L dual-chamber pacemaker  05/2015 - Dr. Inocencio   Past Surgical History:  Procedure Laterality Date   CARDIAC CATHETERIZATION  08/13/11   CARDIOVERSION N/A 07/02/2015   Procedure: CARDIOVERSION;  Surgeon: Jerel Balding, MD;  Location: [facility] END[facility];  Service: Cardiovascular;  Laterality: N/A;   CATARACT EXTRACTION W/PHACO Left 02/10/2019   Procedure: CATARACT EXTRACTION PHACO AND INTRAOCULAR LENS PLACEMENT (IOC);  Surgeon: Harrie Agent, MD;  Location: [facility] ORS;  Service: Ophthalmology;  Laterality: Left;  CDE: 7.10   CATARACT EXTRACTION W/PHACO Right 02/27/2019   Procedure: CATARACT EXTRACTION PHACO AND INTRAOCULAR LENS PLACEMENT RIGHT EYE CDE=11.84;  Surgeon: Harrie Agent, MD;  Location: [facility] ORS;  Service: Ophthalmology;  Laterality: Right;  right   CYSTOSCOPY WITH HOLMIUM LASER  LITHOTRIPSY  1990's   EP IMPLANTABLE DEVICE N/A 05/21/2015   MDT Advisa DR MRI pacemaker implanted by Dr Inocencio   KNEE ARTHROSCOPY Right ~ 1991   LAPAROSCOPIC CHOLECYSTECTOMY  ~ 2008   PPM GENERATOR CHANGEOUT N/A 02/18/2023   Procedure: PPM GENERATOR CHANGEOUT;  Surgeon: Nancey, Eulas BRAVO, MD;  Location: [facility] INVASIVE CV LAB;  Service: Cardiovascular;  Laterality: N/A;   TRANSURETHRAL RESECTION OF BLADDER TUMOR WITH GYRUS (TURBT-GYRUS)  1990's?   Social History   Socioeconomic History   Marital status: Married    Spouse name: Not on file   Number of children: Not on file   Years of education: Not on file   Highest education level: Not on file  Occupational History   Occupation: Disabled    Employer: DISABLED  Tobacco Use   Smoking status: Every Day    Current packs/day: 0.50    Average packs/day: 0.5 packs/day for 64.8 years (32.4 ttl pk-yrs)    Types: E-cigarettes, Cigarettes    Start date: 02/26/1959   Smokeless tobacco: Former    Types: Chew   Tobacco comments:    05/21/2015 quit chewing in 1992  Vaping Use   Vaping status: Every Day   Substances: Nicotine, Flavoring  Substance and Sexual Activity   Alcohol  use: Yes    Alcohol /week: 2.0 standard drinks of alcohol     Types: 2 Cans of beer per week   Drug use: Yes  Types: Cocaine    Comment: 05/21/2015 quit in the early 2000   Sexual activity: Not on file  Other Topics Concern   Not on file  Social History Narrative   Divorced   No regular exercise   Social Drivers of Health   Financial Resource Strain: Not on file  Food Insecurity: Not on file  Transportation Needs: Not on file  Physical Activity: Not on file  Stress: Not on file  Social Connections: Not on file   Outpatient Encounter Medications as of 12/29/2023  Medication Sig   acetaminophen  (TYLENOL ) 500 MG tablet Take 500 mg by mouth every 6 (six) hours as needed (pain).    albuterol  (PROVENTIL ) (2.5 MG/3ML) 0.083% nebulizer solution Take 2.5 mg by  nebulization every 6 (six) hours as needed for wheezing.   Alcohol  Swabs  70 % PADS Use as directed   ALPRAZolam (XANAX) 0.5 MG tablet Take 0.5 mg by mouth 2 (two) times daily as needed for anxiety.   apixaban  (ELIQUIS ) 5 MG TABS tablet Take 1 tablet by mouth twice daily   BD PEN NEEDLE NANO 2ND GEN 32G X 4 MM MISC USE 4 TIMES DAILY AS DIRECTED   bismuth subsalicylate (PEPTO BISMOL) 262 MG chewable tablet Chew 524 mg by mouth as needed for indigestion.   calcitRIOL (ROCALTROL) 0.25 MCG capsule Take 0.25 mcg by mouth every Monday, Wednesday, and Friday.   Cholecalciferol  (VITAMIN D -3) 1000 UNITS CAPS Take 1,000 Units by mouth 2 (two) times daily.    citalopram  (CELEXA ) 40 MG tablet Take 40 mg by mouth 2 (two) times daily.   Continuous Blood Gluc Receiver (FREESTYLE LIBRE 2 READER) DEVI USE WITH SENSORS TO MONITOR BLOOD SUGAR.   Continuous Glucose Sensor (FREESTYLE LIBRE 2 SENSOR) MISC USE TO MONITOR GLUCOSE CONTINUOUSLY AS DIRECTED. APPLY ONE SENSOR EVERY 14 DAYS.   Fluticasone-Umeclidin-Vilant (TRELEGY ELLIPTA) 200-62.5-25 MCG/ACT AEPB Inhale 1 puff into the lungs daily.   furosemide  (LASIX ) 40 MG tablet TAKE TWO (2) TABLETS BY MOUTH THREE TIMES WEEKLY AND ONE TABLET FOUR TIMES WEEKLY.   gabapentin  (NEURONTIN ) 400 MG capsule Take 400-800 mg by mouth See admin instructions. Take 400 mg in the morning and 800 mg in the evening   HUMALOG  KWIKPEN 100 UNIT/ML KwikPen INJECT 20 TO 26 UNITS SUBCUTANEOUSLY THREE TIMES DAILY   insulin  glargine (LANTUS  SOLOSTAR) 100 UNIT/ML Solostar Pen Inject 70 Units into the skin at bedtime.   Insulin  Pen Needle (B-D ULTRAFINE III SHORT PEN) 31G X 8 MM MISC Use to inject insulin  4 x daily   Lancets (FREESTYLE) lancets USE TO CHECK BLOOD SUGAR FOUR TIMES DAILY.   magnesium oxide (MAG-OX) 400 (240 Mg) MG tablet Take 1 tablet by mouth daily.   metoCLOPramide (REGLAN) 5 MG tablet Take 5 mg by mouth every 6 (six) hours as needed for nausea or vomiting.   metoprolol  succinate  (TOPROL -XL) 50 MG 24 hr tablet TAKE 1 TABLET BY MOUTH ONCE DAILY *STOP CARVEDILOL *   nitroGLYCERIN  (NITROSTAT ) 0.4 MG SL tablet DISSOLVE ONE TABLET UNDER TONGUE EVERY 5 MINUTES UP TO 3 DOSES AS NEEDED FOR CHEST PAIN (IF NO RELIEF AFTER 2ND DOSE GO TO ED OR CALL 911)   NON FORMULARY 1 each by Other route See admin instructions. CPAP - Uses nightly   sacubitril-valsartan (ENTRESTO ) 24-26 MG Take 1 tablet by mouth twice daily   simvastatin  (ZOCOR ) 40 MG tablet TAKE 1 TABLET BY MOUTH AT BEDTIME   spironolactone  (ALDACTONE ) 25 MG tablet Take 25 mg by mouth daily.   traMADol (ULTRAM)  50 MG tablet Take 1 tablet by mouth every 8 (eight) hours as needed (pain).   triamcinolone  cream (KENALOG ) 0.1 % Apply 1 application  topically 2 (two) times daily as needed (rash).   vitamin B-12 (CYANOCOBALAMIN) 1000 MCG tablet Take 1,000 mcg by mouth 2 (two) times daily.   albuterol  (VENTOLIN  HFA) 108 (90 Base) MCG/ACT inhaler Inhale 1-2 puffs into the lungs every 4 (four) hours as needed for wheezing or shortness of breath.   cholestyramine light (PREVALITE) 4 g packet Take 4 g by mouth daily as needed (diarrhea).   diphenhydrAMINE (BENADRYL) 25 MG tablet Take 25 mg by mouth daily as needed for allergies.   IBU 800 MG tablet Take 800 mg by mouth every 6 (six) hours as needed for moderate pain.   VOLTAREN 1 % GEL Apply 2 g topically 4 (four) times daily as needed (for pain).    No facility-administered encounter medications on file as of 12/29/2023.    ALLERGIES: No Known Allergies  VACCINATION STATUS:  There is no immunization history on file for this patient.  Diabetes He presents for his follow-up diabetic visit. He has type 2 diabetes mellitus. Onset time: He was diagnosed at approximate age of 45 years. His disease course has been fluctuating. There are no hypoglycemic associated symptoms. Pertinent negatives for hypoglycemia include no confusion, hunger, pallor or seizures. Associated symptoms include foot  paresthesias. Pertinent negatives for diabetes include no fatigue, no polydipsia, no polyphagia, no polyuria, no weakness and no weight loss (intentional). There are no hypoglycemic complications. Symptoms are stable. Diabetic complications include heart disease, nephropathy and peripheral neuropathy. Risk factors for coronary artery disease include dyslipidemia, diabetes mellitus, hypertension, male sex, sedentary lifestyle, tobacco exposure, family history and obesity. Current diabetic treatment includes intensive insulin  program. He is compliant with treatment some of the time (falls asleep early and forgets to take Lantus ). His weight is fluctuating minimally. He is following a generally unhealthy diet. When asked about meal planning, he reported none. He has not had a previous visit with a dietitian. He never [facility] with cane due to bilateral knee arthritis) participates in exercise. His home blood glucose trend is fluctuating dramatically. His overall blood glucose range is >200 mg/dl. (He presents today, accompanied by his wife, with his CGM showing dramatically fluctuating glycemic profile yet still above target overall.  His POCT A1c today is 8.5%, improving from last visit of 9.3%.  Analysis of his CGM shows TIR 11%, TAR 88%, TBR 1% with a GMI of 9.4%.  He still is missing opportunities to inject his Lantus .  He has been more active recently doing yard work and noticed some drops in glucose when he goes too long between eating.) An ACE inhibitor/angiotensin II receptor blocker is being taken. He sees a podiatrist.Eye exam is current.  Hyperlipidemia This is a chronic problem. The current episode started more than 1 year ago. The problem is controlled. Recent lipid tests were reviewed and are normal. Exacerbating diseases include chronic renal disease, diabetes and obesity. Factors aggravating his hyperlipidemia include smoking, fatty foods and beta blockers. Pertinent negatives include no myalgias or  shortness of breath. Current antihyperlipidemic treatment includes statins. The current treatment provides mild improvement of lipids. Compliance problems include adherence to diet and adherence to exercise.  Risk factors for coronary artery disease include dyslipidemia, diabetes mellitus, hypertension, a sedentary lifestyle, male sex and obesity.  Hypertension This is a chronic problem. The current episode started more than 1 year ago. The problem has been resolved  since onset. The problem is controlled. Pertinent negatives include no shortness of breath. There are no associated agents to hypertension. Risk factors for coronary artery disease include diabetes mellitus, dyslipidemia, family history, male gender, obesity, sedentary lifestyle and smoking/tobacco exposure. Past treatments include beta blockers and diuretics. The current treatment provides significant improvement. Compliance problems include diet and exercise.  Hypertensive end-organ damage includes kidney disease and CAD/MI. Identifiable causes of hypertension include chronic renal disease.    Review of systems  Constitutional: +stable body weight,  current  Body mass index is 34.14 kg/m. , + fatigue, no subjective hyperthermia, no subjective hypothermia Eyes: no blurry vision, no xerophthalmia ENT: no sore throat, no nodules palpated in throat, no dysphagia/odynophagia, no hoarseness Cardiovascular: no Chest Pain, no Shortness of Breath, no palpitations, + BLE edema Respiratory: chronic cough (smoker), intermittent shortness of breath Gastrointestinal: no Nausea/Vomiting/Diarrhea Musculoskeletal: bilateral knee pain (under evaluation for bilateral TKR) Skin: no rashes, no hyperemia Neurological: no tremors, no numbness, no tingling Psychiatric: no depression, no anxiety   Objective:    BP 114/78 (BP Location: Right Arm, Patient Position: Sitting, Cuff Size: Large)   Pulse 77   Ht 5' 9 (1.753 m)   Wt 231 lb 3.2 oz (104.9 kg)    BMI 34.14 kg/m   Wt Readings from Last 3 Encounters:  12/29/23 231 lb 3.2 oz (104.9 kg)  09/22/23 242 lb 12.8 oz (110.1 kg)  06/11/23 234 lb 3.2 oz (106.2 kg)    BP Readings from Last 3 Encounters:  12/29/23 114/78  09/22/23 112/62  06/11/23 128/76     Physical Exam- Limited  Constitutional:  Body mass index is 34.14 kg/m. , not in acute distress, normal state of mind Eyes:  EOMI, no exophthalmos Musculoskeletal: no gross deformities, strength intact in all four extremities, no gross restriction of joint movements Skin:  no rashes, no hyperemia Neurological: no tremor with outstretched hands   Diabetic Foot Exam - Simple   No data filed      Lipid Panel     Component Value Date/Time   CHOL 101 12/03/2021 0000   TRIG 113 12/03/2021 0000   HDL 32 (A) 12/03/2021 0000   LDLCALC 46 12/03/2021 0000   Recent Results (from the past 2160 hours)  CUP PACEART REMOTE DEVICE CHECK     Status: None   Collection Time: 11/11/23  3:32 AM  Result Value Ref Range   Date Time Interrogation Session [id number]    Pulse Generator Manufacturer MERM    Pulse Gen Model [id number] Azure XT DR MRI    Pulse Gen Serial Number [id number]    Clinic Name [facility]    Implantable Pulse Generator Type Implantable Pulse Generator    Implantable Pulse Generator Implant Date [id number]    Implantable Lead Manufacturer [facility]    Implantable Lead Model 5076 CapSureFix Novus    Implantable Lead Serial Number [id number]    Implantable Lead Implant Date [id number]    Implantable Lead Location Detail 1 UNKNOWN    Implantable Lead Location [id number]    Implantable Lead Connection Status [id number]    Implantable Lead Manufacturer [facility]    Implantable Lead Model 5076 CapSureFix Novus    Implantable Lead Serial Number [id number]    Implantable Lead Implant Date [id number]    Implantable Lead Location Detail 1 UNKNOWN    Implantable Lead Location [id number]    Implantable Lead Connection Status [id number]    Lead  Channel Setting Sensing Sensitivity 2.8 mV   Lead Channel Setting Pacing Pulse  Width 0.4 ms   Lead Channel Setting Pacing Amplitude 2 V   Zone Setting Status [id number]    Zone Setting Status Active    Lead Channel Impedance Value 551 ohm   Lead Channel Impedance Value 361 ohm   Lead Channel Sensing Intrinsic Amplitude 2.25 mV   Lead Channel Sensing Intrinsic Amplitude 2.25 mV   Lead Channel Impedance Value 437 ohm   Lead Channel Impedance Value 342 ohm   Lead Channel Sensing Intrinsic Amplitude 4.125 mV   Lead Channel Sensing Intrinsic Amplitude 4.125 mV   Lead Channel Pacing Threshold Amplitude 0.875 V   Lead Channel Pacing Threshold Pulse Width 0.4 ms   Battery Status OK    Battery Remaining Longevity 145 mo   Battery Voltage 3.14 V   Brady Statistic RA Percent Paced 0 %   Brady Statistic RV Percent Paced 94.53 %   Brady Statistic AP VP Percent 0 %   Brady Statistic AS VP Percent 96.71 %   [place] AP VS Percent 0 %   Brady Statistic AS VS Percent 3.32 %      Latest Ref Rng & Units 02/05/2022    2:08 PM 12/03/2021   12:00 AM 06/25/2020   12:00 AM  CMP  Glucose 70 - 99 mg/dL 750     BUN 8 - 23 mg/dL 17  13  25       Creatinine 0.61 - 1.24 mg/dL 8.24  1.7  1.6      Sodium 135 - 145 mmol/L 137  137    Potassium 3.5 - 5.1 mmol/L 4.5  4.7    Chloride 98 - 111 mmol/L 102  102    CO2 22 - 32 mmol/L 27  28    Calcium 8.9 - 10.3 mg/dL 8.9  8.8  9.2      Total Protein 6.5 - 8.1 g/dL 6.8     Total Bilirubin 0.3 - 1.2 mg/dL 1.2     Alkaline Phos 38 - 126 U/L 63  74    AST 15 - 41 U/L 16  18    ALT 0 - 44 U/L 14  20       This result is from an external source.     Assessment & Plan:   1) Type 2 diabetes mellitus with stage 3 chronic kidney disease, without long-term current use of insulin  (HCC)  - Patient has currently uncontrolled symptomatic type 2 DM since 78 years of age.  He presents today, accompanied by his wife, with his CGM showing dramatically fluctuating  glycemic profile yet still above target overall.  His POCT A1c today is 8.5%, improving from last visit of 9.3%.  Analysis of his CGM shows TIR 11%, TAR 88%, TBR 1% with a GMI of 9.4%.  He still is missing opportunities to inject his Lantus .  He has been more active recently doing yard work and noticed some drops in glucose when he goes too long between eating.  -his diabetes is complicated by stage 3 renal insufficiency (improving), obesity/sedentary life, neuropathy, cardiomyopathy, chronic heavy smoking, inadequate insurance and BRADEY LUZIER remains at a high risk for more acute and chronic complications which include CAD, CVA, CKD, retinopathy, and neuropathy. These are all discussed in detail with the patient.  - Nutritional counseling repeated at each appointment due to patients tendency to fall back in to old habits.  - The patient admits there is a room for improvement in their diet and drink choices. -  Suggestion is made  for the patient to avoid simple carbohydrates from their diet including Cakes, Sweet Desserts / Pastries, Ice Cream, Soda (diet and regular), Sweet Tea, Candies, Chips, Cookies, Sweet Pastries, Store Bought Juices, Alcohol  in Excess of 1-2 drinks a day, Artificial Sweeteners, Coffee Creamer, and Sugar-free Products. This will help patient to have stable blood glucose profile and potentially avoid unintended weight gain.   - I encouraged the patient to switch to unprocessed or minimally processed complex starch and increased protein intake (animal or plant source), fruits, and vegetables.   - Patient is advised to stick to a routine mealtimes to eat 3 meals a day and avoid unnecessary snacks (to snack only to correct hypoglycemia).  - I have approached him with the following individualized plan to manage diabetes and patient agrees:   - Based on his glucose profile, he will continue to need intensive treatment with higher dose of basal/bolus insulin  in order for him to  achieve control of diabetes to target.    -He is advised to increase his Lantus  to 80 units at supper (wife agreed to help remind him) and will change his Humalog  to Novolog  (due to style of pen and ease of use)at same dose of 16-22 units TID with meals if glucose is above 90 and he is eating (Specific instructions on how to titrate insulin  dosage based on glucose readings given to patient in writing).    -He is encouraged to continue using his CGM to monitor blood glucose 4 times per day, before meals and at bedtime and report to the clinic if blood glucose levels are less than 70 or greater than 200 for 3 tests in a row.  - He is not a candidate for metformin  therapy due to CKD.  - He is not a candidate for incretin therapy (such as Ozempic) due to increased risk of pancreatitis r/t hypertriglyceridemia and heavy smoking history.  He asked again about starting this medication today.  I advised him to quit smoking and I would consider it, but right now due to high triglycerides and smoking history, he would be high risk for pancreatitis.  2) Lipids/HPL:  His recent lipid panel from 06/26/22 shows controlled LDL of 26 and significantly elevated triglycerides of 494.  He is advised to continue Cholestyramine 4 g TID with meals and Simvastatin  40 mg po daily at bedtime.  Side effects and precautions discussed with him.  He is advised to avoid fried foods and butter.  He has appt coming up with his PCP, will request copy of labs for our records.  3) Hypertension His blood pressure is controlled to target for his age.  He is advised to continue Coreg  6.25 mg po twice daily, Lasix  40 mg po every other day (alternating with 20 mg), and Entresto  24-26 mg po twice daily.   4) Chronic Care/Health Maintenance: -he is on ACEI/ARB and Statin medications and  is encouraged to continue to follow up with Ophthalmology, Dentist,  Podiatrist at least yearly or according to recommendations. - I have recommended  yearly flu vaccine and pneumonia vaccination at least every 5 years; and  sleep for at least 7 hours a day.  The patient was counseled on the dangers of tobacco use, and was advised to quit.  Reviewed strategies to maximize success, including removing cigarettes and smoking materials from environment.  5) Weight management:  His Body mass index is 34.14 kg/m. He is a candidate for modest weight loss.  Exercise regimen and carbs restrictions were detailed with  him.   - I advised patient to maintain close follow up with Rosamond Leta NOVAK, MD for primary care needs.       I spent  32  minutes in the care of the patient today including review of labs from CMP, Lipids, Thyroid  Function, Hematology (current and previous including abstractions from other facilities); face-to-face time discussing  his blood glucose readings/logs, discussing hypoglycemia and hyperglycemia episodes and symptoms, medications doses, his options of short and long term treatment based on the latest standards of care / guidelines;  discussion about incorporating lifestyle medicine;  and documenting the encounter. Risk reduction counseling performed per USPSTF guidelines to reduce obesity and cardiovascular risk factors.     Please refer to Patient Instructions for Blood Glucose Monitoring and Insulin /Medications Dosing Guide  in media tab for additional information. Please  also refer to  Patient Self Inventory in the Media  tab for reviewed elements of pertinent patient history.  Elgin CHRISTELLA [place] participated in the discussions, expressed understanding, and voiced agreement with the above plans.  All questions were answered to his satisfaction. he is encouraged to contact clinic should he have any questions or concerns prior to his return visit.   Follow up plan: - No follow-ups on file.    Benton Rio, [facility] [address] [place], [place] [address] Phone: [phone] Fax:  [phone]   12/29/2023, 4:12 PM

## 2024-01-06 ENCOUNTER — Other Ambulatory Visit: Payer: Self-pay | Admitting: Nurse Practitioner

## 2024-01-06 DIAGNOSIS — E1122 Type 2 diabetes mellitus with diabetic chronic kidney disease: Secondary | ICD-10-CM

## 2024-01-17 DIAGNOSIS — Z0000 Encounter for general adult medical examination without abnormal findings: Secondary | ICD-10-CM | POA: Diagnosis not present

## 2024-01-17 DIAGNOSIS — I10 Essential (primary) hypertension: Secondary | ICD-10-CM | POA: Diagnosis not present

## 2024-01-17 DIAGNOSIS — Z299 Encounter for prophylactic measures, unspecified: Secondary | ICD-10-CM | POA: Diagnosis not present

## 2024-01-17 DIAGNOSIS — I25119 Atherosclerotic heart disease of native coronary artery with unspecified angina pectoris: Secondary | ICD-10-CM | POA: Diagnosis not present

## 2024-01-17 DIAGNOSIS — Z7189 Other specified counseling: Secondary | ICD-10-CM | POA: Diagnosis not present

## 2024-01-17 DIAGNOSIS — Z1331 Encounter for screening for depression: Secondary | ICD-10-CM | POA: Diagnosis not present

## 2024-01-17 DIAGNOSIS — J449 Chronic obstructive pulmonary disease, unspecified: Secondary | ICD-10-CM | POA: Diagnosis not present

## 2024-01-17 DIAGNOSIS — F332 Major depressive disorder, recurrent severe without psychotic features: Secondary | ICD-10-CM | POA: Diagnosis not present

## 2024-01-17 DIAGNOSIS — Z1339 Encounter for screening examination for other mental health and behavioral disorders: Secondary | ICD-10-CM | POA: Diagnosis not present

## 2024-01-17 DIAGNOSIS — R52 Pain, unspecified: Secondary | ICD-10-CM | POA: Diagnosis not present

## 2024-01-17 DIAGNOSIS — F17210 Nicotine dependence, cigarettes, uncomplicated: Secondary | ICD-10-CM | POA: Diagnosis not present

## 2024-01-17 DIAGNOSIS — Z6835 Body mass index (BMI) 35.0-35.9, adult: Secondary | ICD-10-CM | POA: Diagnosis not present

## 2024-01-18 ENCOUNTER — Other Ambulatory Visit: Payer: Self-pay | Admitting: Nurse Practitioner

## 2024-01-25 ENCOUNTER — Telehealth: Payer: Self-pay

## 2024-01-25 NOTE — Progress Notes (Signed)
   01/25/2024  Patient ID: Dylan Tyler, male   DOB: 10-05-1945, 78 y.o.   MRN: [id number]   Contacted patient regarding medication adherence from a quality report for [facility]. The patient is listed as at-risk MAC for 2025.    Per [facility] and dispensing pharmacy l fill history: 1. Simvastatin  40 mg - last filled 01/17/24 for a 90-day supply.   I will follow up for adherence monitoring.  Thank you for allowing pharmacy to be a part of this patient's care.    Heather Factor, PharmD Clinical Pharmacist  [phone]

## 2024-02-07 ENCOUNTER — Other Ambulatory Visit: Payer: Self-pay | Admitting: Cardiology

## 2024-02-07 NOTE — Telephone Encounter (Signed)
 Prescription refill request for Eliquis  received. Indication: AF Last office visit: 06/11/23  A Mealor MD Scr: 2.09 on 08/05/23  Epic Age: 78 Weight: 106.2kg  Based on above findings Eliquis  5mg  twice daily is the appropriate dose.  Refill approved.

## 2024-02-10 ENCOUNTER — Ambulatory Visit: Payer: PPO

## 2024-02-10 DIAGNOSIS — I442 Atrioventricular block, complete: Secondary | ICD-10-CM | POA: Diagnosis not present

## 2024-02-10 LAB — CUP PACEART REMOTE DEVICE CHECK
Battery Remaining Longevity: 143 mo
Battery Voltage: 3.07 V
Brady Statistic AP VP Percent: 0 %
Brady Statistic AP VS Percent: 0 %
Brady Statistic AS VP Percent: 95.56 %
Brady Statistic AS VS Percent: 4.74 %
Brady Statistic RA Percent Paced: 0 %
Brady Statistic RV Percent Paced: 95.14 %
Date Time Interrogation Session: 20250806192510
Implantable Lead Connection Status: 753985
Implantable Lead Connection Status: 753985
Implantable Lead Implant Date: 20161115
Implantable Lead Implant Date: 20161115
Implantable Lead Location: 753859
Implantable Lead Location: 753860
Implantable Lead Model: 5076
Implantable Lead Model: 5076
Implantable Pulse Generator Implant Date: 20240815
Lead Channel Impedance Value: 342 Ohm
Lead Channel Impedance Value: 361 Ohm
Lead Channel Impedance Value: 456 Ohm
Lead Channel Impedance Value: 513 Ohm
Lead Channel Pacing Threshold Amplitude: 0.875 V
Lead Channel Pacing Threshold Pulse Width: 0.4 ms
Lead Channel Sensing Intrinsic Amplitude: 0.5 mV
Lead Channel Sensing Intrinsic Amplitude: 0.5 mV
Lead Channel Sensing Intrinsic Amplitude: 7.125 mV
Lead Channel Sensing Intrinsic Amplitude: 7.125 mV
Lead Channel Setting Pacing Amplitude: 2 V
Lead Channel Setting Pacing Pulse Width: 0.4 ms
Lead Channel Setting Sensing Sensitivity: 2.8 mV
Zone Setting Status: 755011

## 2024-02-15 ENCOUNTER — Ambulatory Visit: Payer: Self-pay | Admitting: Cardiovascular Disease

## 2024-02-19 ENCOUNTER — Other Ambulatory Visit: Payer: Self-pay | Admitting: Nurse Practitioner

## 2024-02-28 DIAGNOSIS — F17210 Nicotine dependence, cigarettes, uncomplicated: Secondary | ICD-10-CM | POA: Diagnosis not present

## 2024-02-28 DIAGNOSIS — G8929 Other chronic pain: Secondary | ICD-10-CM | POA: Diagnosis not present

## 2024-02-28 DIAGNOSIS — R52 Pain, unspecified: Secondary | ICD-10-CM | POA: Diagnosis not present

## 2024-02-28 DIAGNOSIS — M459 Ankylosing spondylitis of unspecified sites in spine: Secondary | ICD-10-CM | POA: Diagnosis not present

## 2024-02-28 DIAGNOSIS — F419 Anxiety disorder, unspecified: Secondary | ICD-10-CM | POA: Diagnosis not present

## 2024-02-28 DIAGNOSIS — I10 Essential (primary) hypertension: Secondary | ICD-10-CM | POA: Diagnosis not present

## 2024-02-28 DIAGNOSIS — I2510 Atherosclerotic heart disease of native coronary artery without angina pectoris: Secondary | ICD-10-CM | POA: Diagnosis not present

## 2024-02-28 DIAGNOSIS — Z299 Encounter for prophylactic measures, unspecified: Secondary | ICD-10-CM | POA: Diagnosis not present

## 2024-03-15 DIAGNOSIS — R809 Proteinuria, unspecified: Secondary | ICD-10-CM | POA: Diagnosis not present

## 2024-03-15 DIAGNOSIS — I10 Essential (primary) hypertension: Secondary | ICD-10-CM | POA: Diagnosis not present

## 2024-03-15 DIAGNOSIS — N189 Chronic kidney disease, unspecified: Secondary | ICD-10-CM | POA: Diagnosis not present

## 2024-03-15 DIAGNOSIS — D631 Anemia in chronic kidney disease: Secondary | ICD-10-CM | POA: Diagnosis not present

## 2024-03-20 DIAGNOSIS — R809 Proteinuria, unspecified: Secondary | ICD-10-CM | POA: Diagnosis not present

## 2024-03-20 DIAGNOSIS — N1832 Chronic kidney disease, stage 3b: Secondary | ICD-10-CM | POA: Diagnosis not present

## 2024-03-20 DIAGNOSIS — E1122 Type 2 diabetes mellitus with diabetic chronic kidney disease: Secondary | ICD-10-CM | POA: Diagnosis not present

## 2024-03-20 DIAGNOSIS — D472 Monoclonal gammopathy: Secondary | ICD-10-CM | POA: Diagnosis not present

## 2024-03-25 ENCOUNTER — Other Ambulatory Visit: Payer: Self-pay | Admitting: Nurse Practitioner

## 2024-03-25 DIAGNOSIS — N1832 Chronic kidney disease, stage 3b: Secondary | ICD-10-CM

## 2024-03-25 DIAGNOSIS — E1122 Type 2 diabetes mellitus with diabetic chronic kidney disease: Secondary | ICD-10-CM

## 2024-03-30 ENCOUNTER — Ambulatory Visit: Admitting: Nurse Practitioner

## 2024-03-30 ENCOUNTER — Encounter: Payer: Self-pay | Admitting: Nurse Practitioner

## 2024-03-30 VITALS — BP 114/76 | HR 75 | Ht 69.0 in | Wt 236.8 lb

## 2024-03-30 DIAGNOSIS — E1122 Type 2 diabetes mellitus with diabetic chronic kidney disease: Secondary | ICD-10-CM | POA: Diagnosis not present

## 2024-03-30 DIAGNOSIS — E782 Mixed hyperlipidemia: Secondary | ICD-10-CM | POA: Diagnosis not present

## 2024-03-30 DIAGNOSIS — I10 Essential (primary) hypertension: Secondary | ICD-10-CM

## 2024-03-30 DIAGNOSIS — Z794 Long term (current) use of insulin: Secondary | ICD-10-CM | POA: Diagnosis not present

## 2024-03-30 DIAGNOSIS — N1832 Chronic kidney disease, stage 3b: Secondary | ICD-10-CM

## 2024-03-30 DIAGNOSIS — F17200 Nicotine dependence, unspecified, uncomplicated: Secondary | ICD-10-CM

## 2024-03-30 LAB — POCT GLYCOSYLATED HEMOGLOBIN (HGB A1C): Hemoglobin A1C: 8.6 % — AB (ref 4.0–5.6)

## 2024-03-30 NOTE — Progress Notes (Signed)
 03/30/2024            Endocrinology follow-up note   Subjective:    Patient ID: Dylan Tyler, male    DOB: [birth year].  he is being seen in follow-up  for management of currently uncontrolled, complicated type 2 diabetes, hyperlipidemia, hypertension.   PMD:  Rosamond Leta NOVAK, MD.   Past Medical History:  Diagnosis Date   Ankylosing spondylitis (HCC)    Anxiety    Aortic root dilatation    AR (aortic regurgitation)    Atrial fibrillation and flutter (HCC)    Bladder cancer (HCC)    Chronic pain    Followed at [facility]   COPD (chronic obstructive pulmonary disease) (HCC)    DDD (degenerative disc disease), lumbosacral    DDD (degenerative disc disease), thoracolumbar    Degenerative cervical disc    Depression    Diabetes mellitus type II    Diverticulitis    Essential hypertension    GERD (gastroesophageal reflux disease)    History of kidney stones    Hyperlipidemia    Migraine    Nonischemic cardiomyopathy (HCC)    Cardiolite 12/09 LVEF 55%, minor luminal irregularities at cardiac catheterization   OSA on CPAP    Rheumatoid arthritis (HCC)    Symptomatic bradycardia    Medtronic Advisa L dual-chamber pacemaker  05/2015 - Dr. Inocencio   Past Surgical History:  Procedure Laterality Date   CARDIAC CATHETERIZATION  08/13/11   CARDIOVERSION N/A 07/02/2015   Procedure: CARDIOVERSION;  Surgeon: Jerel Balding, MD;  Location: [facility];  Service: Cardiovascular;  Laterality: N/A;   CATARACT EXTRACTION W/PHACO Left 02/10/2019   Procedure: CATARACT EXTRACTION PHACO AND INTRAOCULAR LENS PLACEMENT (IOC);  Surgeon: Harrie Agent, MD;  Location: [facility] ORS;  Service: Ophthalmology;  Laterality: Left;  CDE: 7.10   CATARACT EXTRACTION W/PHACO Right 02/27/2019   Procedure: CATARACT EXTRACTION PHACO AND INTRAOCULAR LENS PLACEMENT RIGHT EYE CDE=11.84;  Surgeon: Harrie Agent, MD;  Location: [facility];  Service: Ophthalmology;  Laterality: Right;  right   CYSTOSCOPY WITH HOLMIUM LASER  LITHOTRIPSY  1990's   EP IMPLANTABLE DEVICE N/A 05/21/2015   MDT Advisa DR MRI pacemaker implanted by Dr Inocencio   KNEE ARTHROSCOPY Right ~ 1991   LAPAROSCOPIC CHOLECYSTECTOMY  ~ 2008   PPM GENERATOR CHANGEOUT N/A 02/18/2023   Procedure: PPM GENERATOR CHANGEOUT;  Surgeon: Nancey, Eulas BRAVO, MD;  Location: [facility] INVASIVE CV LAB;  Service: Cardiovascular;  Laterality: N/A;   TRANSURETHRAL RESECTION OF BLADDER TUMOR WITH GYRUS (TURBT-GYRUS)  1990's?   Social History   Socioeconomic History   Marital status: Married    Spouse name: Not on file   Number of children: Not on file   Years of education: Not on file   Highest education level: Not on file  Occupational History   Occupation: Disabled    Employer: DISABLED  Tobacco Use   Smoking status: Every Day    Current packs/day: 0.50    Average packs/day: 0.5 packs/day for 65.1 years (32.5 ttl pk-yrs)    Types: E-cigarettes, Cigarettes    Start date: 02/26/1959   Smokeless tobacco: Former    Types: Chew   Tobacco comments:    05/21/2015 quit chewing in 1992  Vaping Use   Vaping status: Every Day   Substances: Nicotine, Flavoring  Substance and Sexual Activity   Alcohol  use: Yes    Alcohol /week: 2.0 standard drinks of alcohol     Types: 2 Cans of beer per week   Drug use: Yes  Types: Cocaine    Comment: 05/21/2015 quit in the early 2000   Sexual activity: Not on file  Other Topics Concern   Not on file  Social History Narrative   Divorced   No regular exercise   Social Drivers of Health   Financial Resource Strain: Not on file  Food Insecurity: Not on file  Transportation Needs: Not on file  Physical Activity: Not on file  Stress: Not on file  Social Connections: Not on file   Outpatient Encounter Medications as of 03/30/2024  Medication Sig   acetaminophen  (TYLENOL ) 500 MG tablet Take 500 mg by mouth every 6 (six) hours as needed (pain).    albuterol  (PROVENTIL ) (2.5 MG/3ML) 0.083% nebulizer solution Take 2.5 mg by  nebulization every 6 (six) hours as needed for wheezing.   albuterol  (VENTOLIN  HFA) 108 (90 Base) MCG/ACT inhaler Inhale 1-2 puffs into the lungs every 4 (four) hours as needed for wheezing or shortness of breath.   Alcohol  Swabs  70 % PADS Use as directed   ALPRAZolam (XANAX) 0.5 MG tablet Take 0.5 mg by mouth 2 (two) times daily as needed for anxiety.   apixaban  (ELIQUIS ) 5 MG TABS tablet Take 1 tablet by mouth twice daily   bismuth subsalicylate (PEPTO BISMOL) 262 MG chewable tablet Chew 524 mg by mouth as needed for indigestion.   calcitRIOL (ROCALTROL) 0.25 MCG capsule Take 0.25 mcg by mouth every Monday, Wednesday, and Friday.   Cholecalciferol  (VITAMIN D -3) 1000 UNITS CAPS Take 1,000 Units by mouth 2 (two) times daily.    cholestyramine light (PREVALITE) 4 g packet Take 4 g by mouth daily as needed (diarrhea).   citalopram  (CELEXA ) 40 MG tablet Take 40 mg by mouth 2 (two) times daily.   Continuous Blood Gluc Receiver (FREESTYLE LIBRE 2 READER) DEVI USE WITH SENSORS TO MONITOR BLOOD SUGAR.   Continuous Glucose Sensor (FREESTYLE LIBRE 2 SENSOR) MISC APPLY ONE SENSOR TO MONITOR GLUCOSE CONTINUOUSLY EVERY 14 DAYS AS DIRECTED   diphenhydrAMINE (BENADRYL) 25 MG tablet Take 25 mg by mouth daily as needed for allergies.   Fluticasone-Umeclidin-Vilant (TRELEGY ELLIPTA) 200-62.5-25 MCG/ACT AEPB Inhale 1 puff into the lungs daily.   furosemide  (LASIX ) 40 MG tablet TAKE TWO (2) TABLETS BY MOUTH THREE TIMES WEEKLY AND ONE TABLET FOUR TIMES WEEKLY.   gabapentin  (NEURONTIN ) 400 MG capsule Take 400-800 mg by mouth See admin instructions. Take 400 mg in the morning and 800 mg in the evening   IBU 800 MG tablet Take 800 mg by mouth every 6 (six) hours as needed for moderate pain.   insulin  aspart (NOVOLOG  FLEXPEN) 100 UNIT/ML FlexPen Inject 16-22 Units into the skin 3 (three) times daily with meals.   insulin  glargine (LANTUS  SOLOSTAR) 100 UNIT/ML Solostar Pen Inject 80 Units into the skin at bedtime. (Patient  taking differently: Inject 70 Units into the skin at bedtime.)   Insulin  Pen Needle (B-D ULTRAFINE III SHORT PEN) 31G X 8 MM MISC Use to inject insulin  4 x daily   Insulin  Pen Needle (EMBECTA PEN NEEDLE NANO 2 GEN) 32G X 4 MM MISC USE 1 PEN NEEDLE 4 TIMES DAILY AS DIRECTED   Lancets (FREESTYLE) lancets USE TO CHECK BLOOD SUGAR FOUR TIMES DAILY.   magnesium oxide (MAG-OX) 400 (240 Mg) MG tablet Take 1 tablet by mouth daily.   metoCLOPramide (REGLAN) 5 MG tablet Take 5 mg by mouth every 6 (six) hours as needed for nausea or vomiting.   metoprolol  succinate (TOPROL -XL) 50 MG 24 hr tablet TAKE 1 TABLET  BY MOUTH ONCE DAILY *STOP CARVEDILOL *   nitroGLYCERIN  (NITROSTAT ) 0.4 MG SL tablet DISSOLVE ONE TABLET UNDER TONGUE EVERY 5 MINUTES UP TO 3 DOSES AS NEEDED FOR CHEST PAIN (IF NO RELIEF AFTER 2ND DOSE GO TO ED OR CALL 911)   NON FORMULARY 1 each by Other route See admin instructions. CPAP - Uses nightly   sacubitril-valsartan (ENTRESTO ) 24-26 MG Take 1 tablet by mouth twice daily   simvastatin  (ZOCOR ) 40 MG tablet TAKE 1 TABLET BY MOUTH AT BEDTIME   spironolactone  (ALDACTONE ) 25 MG tablet Take 25 mg by mouth daily.   traMADol (ULTRAM) 50 MG tablet Take 1 tablet by mouth every 8 (eight) hours as needed (pain).   triamcinolone  cream (KENALOG ) 0.1 % Apply 1 application  topically 2 (two) times daily as needed (rash).   vitamin B-12 (CYANOCOBALAMIN) 1000 MCG tablet Take 1,000 mcg by mouth 2 (two) times daily.   VOLTAREN 1 % GEL Apply 2 g topically 4 (four) times daily as needed (for pain).    No facility-administered encounter medications on file as of 03/30/2024.    ALLERGIES: No Known Allergies  VACCINATION STATUS:  There is no immunization history on file for this patient.  Diabetes He presents for his follow-up diabetic visit. He has type 2 diabetes mellitus. Onset time: He was diagnosed at approximate age of 59 years. His disease course has been fluctuating. There are no hypoglycemic associated  symptoms. Pertinent negatives for hypoglycemia include no confusion, hunger, pallor or seizures. Associated symptoms include foot paresthesias. Pertinent negatives for diabetes include no fatigue, no polydipsia, no polyphagia, no polyuria, no weakness and no weight loss (intentional). There are no hypoglycemic complications. Symptoms are stable. Diabetic complications include heart disease, nephropathy and peripheral neuropathy. Risk factors for coronary artery disease include dyslipidemia, diabetes mellitus, hypertension, male sex, sedentary lifestyle, tobacco exposure, family history and obesity. Current diabetic treatment includes intensive insulin  program. He is compliant with treatment some of the time (falls asleep early and forgets to take Lantus ). His weight is fluctuating minimally. He is following a generally unhealthy diet. When asked about meal planning, he reported none. He has not had a previous visit with a dietitian. He never [facility] with cane due to bilateral knee arthritis) participates in exercise. His home blood glucose trend is decreasing steadily. His overall blood glucose range is >200 mg/dl. (He presents today, accompanied by his wife, with his CGM showing dramatically fluctuating glycemic profile.  His POCT A1c today is 8.6%, essentially unchanged from last visit.  Analysis of his CGM shows TIR 23%, TAR 77%, TBR 0% with a GMI of 8.8%.  He admits he still isn't consistent with taking his medications.  He does not eat on any routine schedule, nor does he sleep on any routine schedule.) An ACE inhibitor/angiotensin II receptor blocker is being taken. He sees a podiatrist.Eye exam is current.    Review of systems  Constitutional: +stable body weight,  current  Body mass index is 34.97 kg/m. , + fatigue, no subjective hyperthermia, no subjective hypothermia Eyes: no blurry vision, no xerophthalmia ENT: no sore throat, no nodules palpated in throat, no dysphagia/odynophagia, no  hoarseness Cardiovascular: no Chest Pain, no Shortness of Breath, no palpitations, + BLE edema Respiratory: chronic cough (smoker), intermittent shortness of breath Gastrointestinal: no Nausea/Vomiting/Diarrhea Musculoskeletal: bilateral knee pain (under evaluation for bilateral TKR) Skin: no rashes, no hyperemia Neurological: no tremors, no numbness, no tingling Psychiatric: no depression, no anxiety   Objective:    BP 114/76 (BP Location: Right Arm,  Patient Position: Sitting, Cuff Size: Large)   Pulse 75   Ht 5' 9 (1.753 m)   Wt 236 lb 12.8 oz (107.4 kg)   BMI 34.97 kg/m   Wt Readings from Last 3 Encounters:  03/30/24 236 lb 12.8 oz (107.4 kg)  12/29/23 231 lb 3.2 oz (104.9 kg)  09/22/23 242 lb 12.8 oz (110.1 kg)    BP Readings from Last 3 Encounters:  03/30/24 114/76  12/29/23 114/78  09/22/23 112/62     Physical Exam- Limited  Constitutional:  Body mass index is 34.97 kg/m. , not in acute distress, normal state of mind Eyes:  EOMI, no exophthalmos Musculoskeletal: no gross deformities, strength intact in all four extremities, no gross restriction of joint movements Skin:  no rashes, no hyperemia Neurological: no tremor with outstretched hands   Diabetic Foot Exam - Simple   No data filed      Lipid Panel     Component Value Date/Time   CHOL 101 12/03/2021 0000   TRIG 113 12/03/2021 0000   HDL 32 (A) 12/03/2021 0000   LDLCALC 46 12/03/2021 0000   Recent Results (from the past 2160 hours)  CUP PACEART REMOTE DEVICE CHECK     Status: None   Collection Time: 02/09/24  7:25 PM  Result Value Ref Range   Date Time Interrogation Session [id number]    Pulse Generator Manufacturer MERM    Pulse Gen Model [id number] Azure XT DR MRI    Pulse Gen Serial Number [id number]    Clinic Name [facility]    Implantable Pulse Generator Type Implantable Pulse Generator    Implantable Pulse Generator Implant Date [id number]    Implantable Lead Manufacturer MERM     Implantable Lead Model 5076 CapSureFix Novus    Implantable Lead Serial Number [id number]    Implantable Lead Implant Date [id number]    Implantable Lead Location Detail 1 UNKNOWN    Implantable Lead Location [id number]    Implantable Lead Connection Status [id number]    Implantable Lead Manufacturer MERM    Implantable Lead Model 5076 CapSureFix Novus    Implantable Lead Serial Number [id number]    Implantable Lead Implant Date [id number]    Implantable Lead Location Detail 1 UNKNOWN    Implantable Lead Location [id number]    Implantable Lead Connection Status [id number]    Lead Channel Setting Sensing Sensitivity 2.8 mV   Lead Channel Setting Pacing Pulse Width 0.4 ms   Lead Channel Setting Pacing Amplitude 2 V   Zone Setting Status 755011    Zone Setting Status Active    Lead Channel Impedance Value 513 ohm   Lead Channel Impedance Value 342 ohm   Lead Channel Sensing Intrinsic Amplitude 0.5 mV   Lead Channel Sensing Intrinsic Amplitude 0.5 mV   Lead Channel Impedance Value 456 ohm   Lead Channel Impedance Value 361 ohm   Lead Channel Sensing Intrinsic Amplitude 7.125 mV   Lead Channel Sensing Intrinsic Amplitude 7.125 mV   Lead Channel Pacing Threshold Amplitude 0.875 V   Lead Channel Pacing Threshold Pulse Width 0.4 ms   Battery Status OK    Battery Remaining Longevity 143 mo   Battery Voltage 3.07 V   Brady Statistic RA Percent Paced 0 %   Brady Statistic RV Percent Paced 95.14 %   Brady Statistic AP VP Percent 0 %   Brady Statistic AS VP Percent 95.56 %   Brady Statistic AP VS Percent 0 %   Brady Statistic AS VS Percent 4.74 %  HgB A1c     Status: Abnormal   Collection Time: 03/30/24  3:32 PM  Result Value Ref Range   Hemoglobin A1C 8.6 (A) 4.0 - 5.6 %   HbA1c POC (<> result, manual entry)     HbA1c, POC (prediabetic range)     HbA1c, POC (controlled diabetic range)        Latest Ref Rng & Units 02/05/2022    2:08 PM 12/03/2021   12:00 AM 06/25/2020   12:00 AM  CMP  Glucose 70 - 99  mg/dL 750     BUN 8 - 23 mg/dL 17  13  25       Creatinine 0.61 - 1.24 mg/dL 8.24  1.7  1.6      Sodium 135 - 145 mmol/L 137  137    Potassium 3.5 - 5.1 mmol/L 4.5  4.7    Chloride 98 - 111 mmol/L 102  102    CO2 22 - 32 mmol/L 27  28    Calcium 8.9 - 10.3 mg/dL 8.9  8.8  9.2      Total Protein 6.5 - 8.1 g/dL 6.8     Total Bilirubin 0.3 - 1.2 mg/dL 1.2     Alkaline Phos 38 - 126 U/L 63  74    AST 15 - 41 U/L 16  18    ALT 0 - 44 U/L 14  20       This result is from an external source.     Assessment & Plan:   1) Type 2 diabetes mellitus with stage 3 chronic kidney disease, without long-term current use of insulin  (HCC)  - Patient has currently uncontrolled symptomatic type 2 DM since 78 years of age.  He presents today, accompanied by his wife, with his CGM showing dramatically fluctuating glycemic profile.  His POCT A1c today is 8.6%, essentially unchanged from last visit.  Analysis of his CGM shows TIR 23%, TAR 77%, TBR 0% with a GMI of 8.8%.  He admits he still isn't consistent with taking his medications.  He does not eat on any routine schedule, nor does he sleep on any routine schedule.  -his diabetes is complicated by stage 3 renal insufficiency (improving), obesity/sedentary life, neuropathy, cardiomyopathy, chronic heavy smoking, inadequate insurance and RAEDYN WENKE remains at a high risk for more acute and chronic complications which include CAD, CVA, CKD, retinopathy, and neuropathy. These are all discussed in detail with the patient.  - Nutritional counseling repeated at each appointment due to patients tendency to fall back in to old habits.  - The patient admits there is a room for improvement in their diet and drink choices. -  Suggestion is made for the patient to avoid simple carbohydrates from their diet including Cakes, Sweet Desserts / Pastries, Ice Cream, Soda (diet and regular), Sweet Tea, Candies, Chips, Cookies, Sweet Pastries, Store Bought Juices, Alcohol  in  Excess of 1-2 drinks a day, Artificial Sweeteners, Coffee Creamer, and Sugar-free Products. This will help patient to have stable blood glucose profile and potentially avoid unintended weight gain.   - I encouraged the patient to switch to unprocessed or minimally processed complex starch and increased protein intake (animal or plant source), fruits, and vegetables.   - Patient is advised to stick to a routine mealtimes to eat 3 meals a day and avoid unnecessary snacks (to snack only to correct hypoglycemia).  - I have approached him with the following individualized plan to manage diabetes and patient agrees:   -Based  on his glucose profile, he will continue to need intensive treatment with higher dose of basal/bolus insulin  in order for him to achieve control of diabetes to target.    -He is advised to be more consistent with his Lantus  70 units at supper and  continue Novolog  (due to style of pen and ease of use)at same dose of 16-22 units TID with meals if glucose is above 90 and he is eating (Specific instructions on how to titrate insulin  dosage based on glucose readings given to patient in writing).    -He is encouraged to continue using his CGM to monitor blood glucose 4 times per day, before meals and at bedtime and report to the clinic if blood glucose levels are less than 70 or greater than 200 for 3 tests in a row.  - He is not a candidate for metformin  therapy due to CKD.  - He is not a candidate for incretin therapy (such as Ozempic) due to increased risk of pancreatitis r/t hypertriglyceridemia and heavy smoking history.  He asked again about starting this medication today.  I advised him to quit smoking and I would consider it, but right now due to high triglycerides and smoking history, he would be high risk for pancreatitis.  2) Lipids/HPL:  His recent lipid panel from 06/26/22 shows controlled LDL of 26 and significantly elevated triglycerides of 494.  He is advised to continue  Cholestyramine 4 g TID with meals and Simvastatin  40 mg po daily at bedtime.  Side effects and precautions discussed with him.  He is advised to avoid fried foods and butter.  He has appt coming up with his PCP, will request copy of labs for our records.  3) Hypertension His blood pressure is controlled to target for his age.  He is advised to continue Coreg  6.25 mg po twice daily, Lasix  40 mg po every other day (alternating with 20 mg), and Entresto  24-26 mg po twice daily.   4) Chronic Care/Health Maintenance: -he is on ACEI/ARB and Statin medications and  is encouraged to continue to follow up with Ophthalmology, Dentist,  Podiatrist at least yearly or according to recommendations. - I have recommended yearly flu vaccine and pneumonia vaccination at least every 5 years; and  sleep for at least 7 hours a day.  The patient was counseled on the dangers of tobacco use, and was advised to quit.  Reviewed strategies to maximize success, including removing cigarettes and smoking materials from environment.  5) Weight management:  His Body mass index is 34.97 kg/m. He is a candidate for modest weight loss.  Exercise regimen and carbs restrictions were detailed with him.   - I advised patient to maintain close follow up with Rosamond Leta NOVAK, MD for primary care needs.      I spent  33  minutes in the care of the patient today including review of labs from CMP, Lipids, Thyroid  Function, Hematology (current and previous including abstractions from other facilities); face-to-face time discussing  his blood glucose readings/logs, discussing hypoglycemia and hyperglycemia episodes and symptoms, medications doses, his options of short and long term treatment based on the latest standards of care / guidelines;  discussion about incorporating lifestyle medicine;  and documenting the encounter. Risk reduction counseling performed per USPSTF guidelines to reduce obesity and cardiovascular risk factors.     Please  refer to Patient Instructions for Blood Glucose Monitoring and Insulin /Medications Dosing Guide  in media tab for additional information. Please  also refer to  Patient Self Inventory in the Media  tab for reviewed elements of pertinent patient history.  Elgin CHRISTELLA [place] participated in the discussions, expressed understanding, and voiced agreement with the above plans.  All questions were answered to his satisfaction. he is encouraged to contact clinic should he have any questions or concerns prior to his return visit.   Follow up plan: - Return in about 3 months (around 06/29/2024) for Diabetes F/U with A1c in office, No previsit labs, Bring meter and logs.    Benton Rio, [facility], [facility] [address] [place], [place] [address] Phone: [phone] Fax: [phone]   03/30/2024, 3:44 PM

## 2024-04-01 NOTE — Progress Notes (Signed)
 Remote PPM Transmission

## 2024-04-27 DIAGNOSIS — Z299 Encounter for prophylactic measures, unspecified: Secondary | ICD-10-CM | POA: Diagnosis not present

## 2024-04-27 DIAGNOSIS — F419 Anxiety disorder, unspecified: Secondary | ICD-10-CM | POA: Diagnosis not present

## 2024-04-27 DIAGNOSIS — I10 Essential (primary) hypertension: Secondary | ICD-10-CM | POA: Diagnosis not present

## 2024-04-27 DIAGNOSIS — E7800 Pure hypercholesterolemia, unspecified: Secondary | ICD-10-CM | POA: Diagnosis not present

## 2024-04-27 DIAGNOSIS — R52 Pain, unspecified: Secondary | ICD-10-CM | POA: Diagnosis not present

## 2024-04-27 DIAGNOSIS — F17210 Nicotine dependence, cigarettes, uncomplicated: Secondary | ICD-10-CM | POA: Diagnosis not present

## 2024-04-27 DIAGNOSIS — R5383 Other fatigue: Secondary | ICD-10-CM | POA: Diagnosis not present

## 2024-04-27 DIAGNOSIS — Z0000 Encounter for general adult medical examination without abnormal findings: Secondary | ICD-10-CM | POA: Diagnosis not present

## 2024-05-11 ENCOUNTER — Ambulatory Visit (INDEPENDENT_AMBULATORY_CARE_PROVIDER_SITE_OTHER): Payer: PPO

## 2024-05-11 DIAGNOSIS — I442 Atrioventricular block, complete: Secondary | ICD-10-CM | POA: Diagnosis not present

## 2024-05-12 ENCOUNTER — Other Ambulatory Visit: Payer: Self-pay | Admitting: Cardiovascular Disease

## 2024-05-14 LAB — CUP PACEART REMOTE DEVICE CHECK
Battery Remaining Longevity: 139 mo
Battery Voltage: 3.03 V
Brady Statistic RA Percent Paced: 0 %
Brady Statistic RV Percent Paced: 98.15 %
Date Time Interrogation Session: 20251105221559
Implantable Lead Connection Status: 753985
Implantable Lead Connection Status: 753985
Implantable Lead Implant Date: 20161115
Implantable Lead Implant Date: 20161115
Implantable Lead Location: 753859
Implantable Lead Location: 753860
Implantable Lead Model: 5076
Implantable Lead Model: 5076
Implantable Pulse Generator Implant Date: 20240815
Lead Channel Impedance Value: 323 Ohm
Lead Channel Impedance Value: 361 Ohm
Lead Channel Impedance Value: 437 Ohm
Lead Channel Impedance Value: 475 Ohm
Lead Channel Pacing Threshold Amplitude: 0.875 V
Lead Channel Pacing Threshold Pulse Width: 0.4 ms
Lead Channel Sensing Intrinsic Amplitude: 2.5 mV
Lead Channel Sensing Intrinsic Amplitude: 2.5 mV
Lead Channel Sensing Intrinsic Amplitude: 25.875 mV
Lead Channel Sensing Intrinsic Amplitude: 25.875 mV
Lead Channel Setting Pacing Amplitude: 2 V
Lead Channel Setting Pacing Pulse Width: 0.4 ms
Lead Channel Setting Sensing Sensitivity: 2.8 mV
Zone Setting Status: 755011

## 2024-05-16 NOTE — Progress Notes (Signed)
 Remote PPM Transmission

## 2024-05-17 ENCOUNTER — Ambulatory Visit: Payer: Self-pay | Admitting: Cardiovascular Disease

## 2024-05-29 DIAGNOSIS — F17210 Nicotine dependence, cigarettes, uncomplicated: Secondary | ICD-10-CM | POA: Diagnosis not present

## 2024-05-29 DIAGNOSIS — R52 Pain, unspecified: Secondary | ICD-10-CM | POA: Diagnosis not present

## 2024-05-29 DIAGNOSIS — I10 Essential (primary) hypertension: Secondary | ICD-10-CM | POA: Diagnosis not present

## 2024-05-29 DIAGNOSIS — I4891 Unspecified atrial fibrillation: Secondary | ICD-10-CM | POA: Diagnosis not present

## 2024-05-29 DIAGNOSIS — I2510 Atherosclerotic heart disease of native coronary artery without angina pectoris: Secondary | ICD-10-CM | POA: Diagnosis not present

## 2024-05-29 DIAGNOSIS — Z299 Encounter for prophylactic measures, unspecified: Secondary | ICD-10-CM | POA: Diagnosis not present

## 2024-05-29 DIAGNOSIS — M459 Ankylosing spondylitis of unspecified sites in spine: Secondary | ICD-10-CM | POA: Diagnosis not present

## 2024-06-18 ENCOUNTER — Other Ambulatory Visit: Payer: Self-pay | Admitting: Nurse Practitioner

## 2024-06-18 DIAGNOSIS — E1122 Type 2 diabetes mellitus with diabetic chronic kidney disease: Secondary | ICD-10-CM

## 2024-06-18 DIAGNOSIS — N1832 Chronic kidney disease, stage 3b: Secondary | ICD-10-CM

## 2024-06-27 ENCOUNTER — Other Ambulatory Visit: Payer: Self-pay | Admitting: Nurse Practitioner

## 2024-07-03 ENCOUNTER — Encounter: Payer: Self-pay | Admitting: Nurse Practitioner

## 2024-07-03 ENCOUNTER — Ambulatory Visit: Admitting: Nurse Practitioner

## 2024-07-03 VITALS — BP 132/60 | HR 84 | Ht 69.0 in | Wt 240.2 lb

## 2024-07-03 DIAGNOSIS — E782 Mixed hyperlipidemia: Secondary | ICD-10-CM

## 2024-07-03 DIAGNOSIS — I10 Essential (primary) hypertension: Secondary | ICD-10-CM

## 2024-07-03 DIAGNOSIS — N1832 Chronic kidney disease, stage 3b: Secondary | ICD-10-CM | POA: Diagnosis not present

## 2024-07-03 DIAGNOSIS — E1122 Type 2 diabetes mellitus with diabetic chronic kidney disease: Secondary | ICD-10-CM

## 2024-07-03 DIAGNOSIS — N184 Chronic kidney disease, stage 4 (severe): Secondary | ICD-10-CM

## 2024-07-03 DIAGNOSIS — Z794 Long term (current) use of insulin: Secondary | ICD-10-CM

## 2024-07-03 DIAGNOSIS — F17200 Nicotine dependence, unspecified, uncomplicated: Secondary | ICD-10-CM

## 2024-07-03 LAB — POCT GLYCOSYLATED HEMOGLOBIN (HGB A1C): Hemoglobin A1C: 8.4 % — AB (ref 4.0–5.6)

## 2024-07-03 MED ORDER — INSULIN PEN NEEDLE 31G X 8 MM MISC: 3 refills | Status: AC

## 2024-07-03 MED ORDER — FREESTYLE LIBRE 2 SENSOR MISC: 0 refills | Status: AC

## 2024-07-03 MED ORDER — NOVOLOG FLEXPEN 100 UNIT/ML ~~LOC~~ SOPN: 16.0000 [IU] | PEN_INJECTOR | Freq: Three times a day (TID) | SUBCUTANEOUS | 3 refills | Status: AC

## 2024-07-03 MED ORDER — LANTUS SOLOSTAR 100 UNIT/ML ~~LOC~~ SOPN: 80.0000 [IU] | PEN_INJECTOR | Freq: Every day | SUBCUTANEOUS | 3 refills | Status: AC

## 2024-07-03 NOTE — Progress Notes (Signed)
 "   07/03/2024            Endocrinology follow-up note   Subjective:    Patient ID: Dylan Tyler, male    DOB: 09-May-1946.  he is being seen in follow-up  for management of currently uncontrolled, complicated type 2 diabetes, hyperlipidemia, hypertension.   PMD:  Rosamond Leta NOVAK, MD.   Past Medical History:  Diagnosis Date   Ankylosing spondylitis (HCC)    Anxiety    Aortic root dilatation    AR (aortic regurgitation)    Atrial fibrillation and flutter (HCC)    Bladder cancer (HCC)    Chronic pain    Followed at [facility]   COPD (chronic obstructive pulmonary disease) (HCC)    DDD (degenerative disc disease), lumbosacral    DDD (degenerative disc disease), thoracolumbar    Degenerative cervical disc    Depression    Diabetes mellitus type II    Diverticulitis    Essential hypertension    GERD (gastroesophageal reflux disease)    History of kidney stones    Hyperlipidemia    Migraine    Nonischemic cardiomyopathy (HCC)    Cardiolite 12/09 LVEF 55%, minor luminal irregularities at cardiac catheterization   OSA on CPAP    Rheumatoid arthritis (HCC)    Symptomatic bradycardia    Medtronic Advisa L dual-chamber pacemaker  05/2015 - Dr. Inocencio   Past Surgical History:  Procedure Laterality Date   CARDIAC CATHETERIZATION  08/13/11   CARDIOVERSION N/A 07/02/2015   Procedure: CARDIOVERSION;  Surgeon: Jerel Balding, MD;  Location: [facility];  Service: Cardiovascular;  Laterality: N/A;   CATARACT EXTRACTION W/PHACO Left 02/10/2019   Procedure: CATARACT EXTRACTION PHACO AND INTRAOCULAR LENS PLACEMENT (IOC);  Surgeon: Harrie Agent, MD;  Location: [facility];  Service: Ophthalmology;  Laterality: Left;  CDE: 7.10   CATARACT EXTRACTION W/PHACO Right 02/27/2019   Procedure: CATARACT EXTRACTION PHACO AND INTRAOCULAR LENS PLACEMENT RIGHT EYE CDE=11.84;  Surgeon: Harrie Agent, MD;  Location: [facility];  Service: Ophthalmology;  Laterality: Right;  right   CYSTOSCOPY WITH HOLMIUM LASER  LITHOTRIPSY  1990's   EP IMPLANTABLE DEVICE N/A 05/21/2015   MDT Advisa DR MRI pacemaker implanted by Dr Inocencio   KNEE ARTHROSCOPY Right ~ 1991   LAPAROSCOPIC CHOLECYSTECTOMY  ~ 2008   PPM GENERATOR CHANGEOUT N/A 02/18/2023   Procedure: PPM GENERATOR CHANGEOUT;  Surgeon: Nancey, Eulas BRAVO, MD;  Location: [facility];  Service: Cardiovascular;  Laterality: N/A;   TRANSURETHRAL RESECTION OF BLADDER TUMOR WITH GYRUS (TURBT-GYRUS)  1990's?   Social History   Socioeconomic History   Marital status: Married    Spouse name: Not on file   Number of children: Not on file   Years of education: Not on file   Highest education level: Not on file  Occupational History   Occupation: Disabled    Employer: DISABLED  Tobacco Use   Smoking status: Every Day    Current packs/day: 0.50    Average packs/day: 0.5 packs/day for 65.4 years (32.7 ttl pk-yrs)    Types: E-cigarettes, Cigarettes    Start date: 02/26/1959   Smokeless tobacco: Former    Types: Chew   Tobacco comments:    05/21/2015 quit chewing in 1992  Vaping Use   Vaping status: Every Day   Substances: Nicotine, Flavoring  Substance and Sexual Activity   Alcohol  use: Yes    Alcohol /week: 2.0 standard drinks of alcohol     Types: 2 Cans of beer per week   Drug use: Yes  Types: Cocaine    Comment: 05/21/2015 quit in the early 2000   Sexual activity: Not on file  Other Topics Concern   Not on file  Social History Narrative   Divorced   No regular exercise   Social Drivers of Health   Tobacco Use: High Risk (07/03/2024)   Patient History    Smoking Tobacco Use: Every Day    Smokeless Tobacco Use: Former    Passive Exposure: Not on Actuary Strain: Not on file  Food Insecurity: Not on file  Transportation Needs: Not on file  Physical Activity: Not on file  Stress: Not on file  Social Connections: Not on file  Depression (EYV7-0): Not on file  Alcohol  Screen: Not on file  Housing: Not on file   Utilities: Not on file  Health Literacy: Not on file   Outpatient Encounter Medications as of 07/03/2024  Medication Sig   acetaminophen  (TYLENOL ) 500 MG tablet Take 500 mg by mouth every 6 (six) hours as needed (pain).    albuterol  (PROVENTIL ) (2.5 MG/3ML) 0.083% nebulizer solution Take 2.5 mg by nebulization every 6 (six) hours as needed for wheezing.   albuterol  (VENTOLIN  HFA) 108 (90 Base) MCG/ACT inhaler Inhale 1-2 puffs into the lungs every 4 (four) hours as needed for wheezing or shortness of breath.   Alcohol  Swabs  70 % PADS Use as directed   ALPRAZolam (XANAX) 0.5 MG tablet Take 0.5 mg by mouth 2 (two) times daily as needed for anxiety.   apixaban  (ELIQUIS ) 5 MG TABS tablet Take 1 tablet by mouth twice daily   bismuth subsalicylate (PEPTO BISMOL) 262 MG chewable tablet Chew 524 mg by mouth as needed for indigestion.   calcitRIOL (ROCALTROL) 0.25 MCG capsule Take 0.25 mcg by mouth every Monday, Wednesday, and Friday.   cholestyramine light (PREVALITE) 4 g packet Take 4 g by mouth daily as needed (diarrhea).   citalopram  (CELEXA ) 40 MG tablet Take 40 mg by mouth 2 (two) times daily.   Continuous Blood Gluc Receiver (FREESTYLE LIBRE 2 READER) DEVI USE WITH SENSORS TO MONITOR BLOOD SUGAR.   EMBECTA PEN NEEDLE NANO 2 GEN 32G X 4 MM MISC USE 1 PEN NEEDLE 4 TIMES DAILY AS DIRECTED   Fluticasone-Umeclidin-Vilant (TRELEGY ELLIPTA) 200-62.5-25 MCG/ACT AEPB Inhale 1 puff into the lungs daily.   furosemide  (LASIX ) 40 MG tablet TAKE TWO (2) TABLETS BY MOUTH THREE TIMES WEEKLY AND ONE TABLET FOUR TIMES WEEKLY.   gabapentin  (NEURONTIN ) 400 MG capsule Take 400-800 mg by mouth See admin instructions. Take 400 mg in the morning and 800 mg in the evening   IBU 800 MG tablet Take 800 mg by mouth every 6 (six) hours as needed for moderate pain.   Lancets (FREESTYLE) lancets USE TO CHECK BLOOD SUGAR FOUR TIMES DAILY.   magnesium oxide (MAG-OX) 400 (240 Mg) MG tablet Take 1 tablet by mouth daily.    metoCLOPramide (REGLAN) 5 MG tablet Take 5 mg by mouth every 6 (six) hours as needed for nausea or vomiting.   metoprolol  succinate (TOPROL -XL) 50 MG 24 hr tablet TAKE 1 TABLET BY MOUTH ONCE DAILY **STOP  CARVEDILOL **   nitroGLYCERIN  (NITROSTAT ) 0.4 MG SL tablet DISSOLVE ONE TABLET UNDER TONGUE EVERY 5 MINUTES UP TO 3 DOSES AS NEEDED FOR CHEST PAIN (IF NO RELIEF AFTER 2ND DOSE GO TO ED OR CALL 911)   NON FORMULARY 1 each by Other route See admin instructions. CPAP - Uses nightly   sacubitril-valsartan (ENTRESTO ) 24-26 MG Take 1 tablet by mouth  twice daily   simvastatin  (ZOCOR ) 40 MG tablet TAKE 1 TABLET BY MOUTH AT BEDTIME   spironolactone  (ALDACTONE ) 25 MG tablet Take 25 mg by mouth daily.   traMADol (ULTRAM) 50 MG tablet Take 1 tablet by mouth every 8 (eight) hours as needed (pain).   vitamin B-12 (CYANOCOBALAMIN) 1000 MCG tablet Take 1,000 mcg by mouth 2 (two) times daily.   [DISCONTINUED] Continuous Glucose Sensor (FREESTYLE LIBRE 2 SENSOR) MISC APPLY 1 SENSOR TO SKIN AS DIRECTED EVERY 14 DAYS   [DISCONTINUED] insulin  aspart (NOVOLOG  FLEXPEN) 100 UNIT/ML FlexPen Inject 16-22 Units into the skin 3 (three) times daily with meals.   [DISCONTINUED] insulin  glargine (LANTUS  SOLOSTAR) 100 UNIT/ML Solostar Pen Inject 80 Units into the skin at bedtime.   [DISCONTINUED] Insulin  Pen Needle (B-D ULTRAFINE III SHORT PEN) 31G X 8 MM MISC Use to inject insulin  4 x daily   Cholecalciferol  (VITAMIN D -3) 1000 UNITS CAPS Take 1,000 Units by mouth 2 (two) times daily.    Continuous Glucose Sensor (FREESTYLE LIBRE 2 SENSOR) MISC APPLY 1 SENSOR TO SKIN AS DIRECTED EVERY 14 DAYS   diphenhydrAMINE (BENADRYL) 25 MG tablet Take 25 mg by mouth daily as needed for allergies.   insulin  aspart (NOVOLOG  FLEXPEN) 100 UNIT/ML FlexPen Inject 16-22 Units into the skin 3 (three) times daily with meals.   insulin  glargine (LANTUS  SOLOSTAR) 100 UNIT/ML Solostar Pen Inject 80 Units into the skin at bedtime.   Insulin  Pen Needle  (B-D ULTRAFINE III SHORT PEN) 31G X 8 MM MISC Use to inject insulin  4 x daily   triamcinolone  cream (KENALOG ) 0.1 % Apply 1 application  topically 2 (two) times daily as needed (rash).   VOLTAREN 1 % GEL Apply 2 g topically 4 (four) times daily as needed (for pain).    No facility-administered encounter medications on file as of 07/03/2024.    ALLERGIES: No Known Allergies  VACCINATION STATUS:  There is no immunization history on file for this patient.  Diabetes He presents for his follow-up diabetic visit. He has type 2 diabetes mellitus. Onset time: He was diagnosed at approximate age of 85 years. His disease course has been fluctuating. There are no hypoglycemic associated symptoms. Pertinent negatives for hypoglycemia include no confusion, hunger, pallor or seizures. Associated symptoms include foot paresthesias. Pertinent negatives for diabetes include no fatigue, no polydipsia, no polyphagia, no polyuria, no weakness and no weight loss (intentional). There are no hypoglycemic complications. Symptoms are stable. Diabetic complications include heart disease, nephropathy and peripheral neuropathy. Risk factors for coronary artery disease include dyslipidemia, diabetes mellitus, hypertension, male sex, sedentary lifestyle, tobacco exposure, family history and obesity. Current diabetic treatment includes intensive insulin  program. He is compliant with treatment some of the time (falls asleep early and forgets to take Lantus ). His weight is fluctuating minimally. He is following a generally unhealthy diet. When asked about meal planning, he reported none. He has not had a previous visit with a dietitian. He never [facility] with cane due to bilateral knee arthritis) participates in exercise. His home blood glucose trend is increasing steadily. His overall blood glucose range is >200 mg/dl. (He presents today, accompanied by his wife, with his CGM showing dramatically fluctuating glycemic profile.  His  POCT A1c today is 8.4%, improving slightly from last visit of 8.6%.  Analysis of his CGM shows TIR 15%, TAR 85%, TBR 0% with a GMI of 9.1%.  He admits he still isn't consistent with taking his medications.  He does not eat on any routine schedule, nor  does he sleep on any routine schedule.  He has been sick between visits.) An ACE inhibitor/angiotensin II receptor blocker is being taken. He sees a podiatrist.Eye exam is current.    Review of systems  Constitutional: +increasing body weight,  current  Body mass index is 35.47 kg/m. , + fatigue, no subjective hyperthermia, no subjective hypothermia Eyes: no blurry vision, no xerophthalmia ENT: no sore throat, no nodules palpated in throat, no dysphagia/odynophagia, no hoarseness Cardiovascular: no Chest Pain, no Shortness of Breath, no palpitations, + BLE edema Respiratory: chronic cough (smoker), intermittent shortness of breath Gastrointestinal: no Nausea/Vomiting/Diarrhea Musculoskeletal: bilateral knee pain (under evaluation for bilateral TKR) Skin: no rashes, no hyperemia Neurological: no tremors, no numbness, no tingling Psychiatric: no depression, no anxiety   Objective:    BP 132/60 (BP Location: Right Arm, Patient Position: Sitting, Cuff Size: Large)   Pulse 84   Ht 5' 9 (1.753 m)   Wt 240 lb 3.2 oz (109 kg)   BMI 35.47 kg/m   Wt Readings from Last 3 Encounters:  07/03/24 240 lb 3.2 oz (109 kg)  03/30/24 236 lb 12.8 oz (107.4 kg)  12/29/23 231 lb 3.2 oz (104.9 kg)    BP Readings from Last 3 Encounters:  07/03/24 132/60  03/30/24 114/76  12/29/23 114/78     Physical Exam- Limited  Constitutional:  Body mass index is 35.47 kg/m. , not in acute distress, normal state of mind Eyes:  EOMI, no exophthalmos Musculoskeletal: no gross deformities, strength intact in all four extremities, no gross restriction of joint movements Skin:  no rashes, no hyperemia Neurological: no tremor with outstretched hands   Diabetic Foot  Exam - Simple   No data filed      Lipid Panel     Component Value Date/Time   CHOL 101 12/03/2021 0000   TRIG 113 12/03/2021 0000   HDL 32 (A) 12/03/2021 0000   LDLCALC 46 12/03/2021 0000   Recent Results (from the past 2160 hours)  CUP PACEART REMOTE DEVICE CHECK     Status: None   Collection Time: 05/10/24 10:15 PM  Result Value Ref Range   Date Time Interrogation Session [phone]    Pulse Generator Manufacturer MERM    Pulse Gen Model [id number] Azure XT DR MRI    Pulse Gen Serial Number [id number]    Clinic Name [facility]    Implantable Pulse Generator Type Implantable Pulse Generator    Implantable Pulse Generator Implant Date [id number]    Implantable Lead Manufacturer [facility]    Implantable Lead Model 5076 CapSureFix Novus    Implantable Lead Serial Number [id number]    Implantable Lead Implant Date [id number]    Implantable Lead Location Detail 1 UNKNOWN    Implantable Lead Location [id number]    Implantable Lead Connection Status [id number]    Implantable Lead Manufacturer [facility]    Implantable Lead Model 5076 CapSureFix Novus    Implantable Lead Serial Number [id number]    Implantable Lead Implant Date [id number]    Implantable Lead Location Detail 1 UNKNOWN    Implantable Lead Location [id number]    Implantable Lead Connection Status [id number]    Lead Channel Setting Sensing Sensitivity 2.8 mV   Lead Channel Setting Pacing Pulse Width 0.4 ms   Lead Channel Setting Pacing Amplitude 2 V   Zone Setting Status [id number]    Zone Setting Status Active    Lead Channel Impedance Value 475 ohm   Lead Channel Impedance Value 323 ohm   Lead Channel Sensing Intrinsic  Amplitude 2.5 mV   Lead Channel Sensing Intrinsic Amplitude 2.5 mV   Lead Channel Impedance Value 437 ohm   Lead Channel Impedance Value 361 ohm   Lead Channel Sensing Intrinsic Amplitude 25.875 mV   Lead Channel Sensing Intrinsic Amplitude 25.875 mV   Lead Channel Pacing Threshold Amplitude 0.875 V   Lead Channel Pacing  Threshold Pulse Width 0.4 ms   Battery Status OK    Battery Remaining Longevity 139 mo   Battery Voltage 3.03 V   Brady Statistic RA Percent Paced 0 %   Brady Statistic RV Percent Paced 98.15 %  HgB A1c     Status: Abnormal   Collection Time: 07/03/24  2:37 PM  Result Value Ref Range   Hemoglobin A1C 8.4 (A) 4.0 - 5.6 %   HbA1c POC (<> result, manual entry)     HbA1c, POC (prediabetic range)     HbA1c, POC (controlled diabetic range)        Latest Ref Rng & Units 02/05/2022    2:08 PM 12/03/2021   12:00 AM 06/25/2020   12:00 AM  CMP  Glucose 70 - 99 mg/dL 750     BUN 8 - 23 mg/dL 17  13  25       Creatinine 0.61 - 1.24 mg/dL 8.24  1.7  1.6      Sodium 135 - 145 mmol/L 137  137    Potassium 3.5 - 5.1 mmol/L 4.5  4.7    Chloride 98 - 111 mmol/L 102  102    CO2 22 - 32 mmol/L 27  28    Calcium 8.9 - 10.3 mg/dL 8.9  8.8  9.2      Total Protein 6.5 - 8.1 g/dL 6.8     Total Bilirubin 0.3 - 1.2 mg/dL 1.2     Alkaline Phos 38 - 126 U/L 63  74    AST 15 - 41 U/L 16  18    ALT 0 - 44 U/L 14  20       This result is from an external source.     Assessment & Plan:   1) Type 2 diabetes mellitus with stage 3 chronic kidney disease, without long-term current use of insulin  (HCC)  - Patient has currently uncontrolled symptomatic type 2 DM since 78 years of age.  He presents today, accompanied by his wife, with his CGM showing dramatically fluctuating glycemic profile.  His POCT A1c today is 8.4%, improving slightly from last visit of 8.6%.  Analysis of his CGM shows TIR 15%, TAR 85%, TBR 0% with a GMI of 9.1%.  He admits he still isn't consistent with taking his medications.  He does not eat on any routine schedule, nor does he sleep on any routine schedule.  He has been sick between visits.  -his diabetes is complicated by stage 3 renal insufficiency (improving), obesity/sedentary life, neuropathy, cardiomyopathy, chronic heavy smoking, inadequate insurance and Dylan Tyler remains at a  high risk for more acute and chronic complications which include CAD, CVA, CKD, retinopathy, and neuropathy. These are all discussed in detail with the patient.  - Nutritional counseling repeated/built upon at each appointment.  - The patient admits there is a room for improvement in their diet and drink choices. -  Suggestion is made for the patient to avoid simple carbohydrates from their diet including Cakes, Sweet Desserts / Pastries, Ice Cream, Soda (diet and regular), Sweet Tea, Candies, Chips, Cookies, Sweet Pastries, Store Bought Juices, Alcohol  in Excess of  1-2 drinks a day, Artificial Sweeteners, Coffee Creamer, and Sugar-free Products. This will help patient to have stable blood glucose profile and potentially avoid unintended weight gain.   - I encouraged the patient to switch to unprocessed or minimally processed complex starch and increased protein intake (animal or plant source), fruits, and vegetables.   - Patient is advised to stick to a routine mealtimes to eat 3 meals a day and avoid unnecessary snacks (to snack only to correct hypoglycemia).  - I have approached him with the following individualized plan to manage diabetes and patient agrees:   -Based on his glucose profile, he will continue to need intensive treatment with higher dose of basal/bolus insulin  in order for him to achieve control of diabetes to target.    -He is advised to be more consistent with his Lantus  60 units at supper and continue Novolog  16-22 units TID with meals if glucose is above 90 and he is eating (Specific instructions on how to titrate insulin  dosage based on glucose readings given to patient in writing).  I lowered Lantus  dose as he is afraid to take his night time insulin  if glucose is in the 100s for fear of hypoglycemia.  -He is encouraged to continue using his CGM to monitor blood glucose 4 times per day, before meals and at bedtime and report to the clinic if blood glucose levels are less  than 70 or greater than 200 for 3 tests in a row.  - He is not a candidate for metformin  therapy due to CKD.  - He is not a candidate for incretin therapy (such as Ozempic) due to increased risk of pancreatitis r/t hypertriglyceridemia and heavy smoking history.  He asked again about starting this medication today.  I advised him to quit smoking and I would consider it, but right now due to high triglycerides and smoking history, he would be high risk for pancreatitis.  2) Lipids/HPL:  His recent lipid panel from 04/28/24 shows controlled LDL of 65 and significantly elevated triglycerides of 391 (improving).  He is advised to continue Cholestyramine 4 g TID with meals and Simvastatin  40 mg po daily at bedtime.  Side effects and precautions discussed with him.  He is advised to avoid fried foods and butter.  He has appt coming up with his PCP, will request copy of labs for our records.  3) Hypertension His blood pressure is controlled to target for his age.  He is advised to continue meds as prescribed by PCP.  4) Chronic Care/Health Maintenance: -he is on ACEI/ARB and Statin medications and  is encouraged to continue to follow up with Ophthalmology, Dentist,  Podiatrist at least yearly or according to recommendations. - I have recommended yearly flu vaccine and pneumonia vaccination at least every 5 years; and  sleep for at least 7 hours a day.  The patient was counseled on the dangers of tobacco use, and was advised to quit.  Reviewed strategies to maximize success, including removing cigarettes and smoking materials from environment.  5) Weight management:  His Body mass index is 35.47 kg/m. He is a candidate for modest weight loss.  Exercise regimen and carbs restrictions were detailed with him.   - I advised patient to maintain close follow up with Rosamond Leta NOVAK, MD for primary care needs.       I spent  25  minutes in the care of the patient today including review of labs from CMP,  Lipids, Thyroid  Function, Hematology (current and previous  including abstractions from other facilities); face-to-face time discussing  his blood glucose readings/logs, discussing hypoglycemia and hyperglycemia episodes and symptoms, medications doses, his options of short and long term treatment based on the latest standards of care / guidelines;  discussion about incorporating lifestyle medicine;  and documenting the encounter. Risk reduction counseling performed per USPSTF guidelines to reduce obesity and cardiovascular risk factors.     Please refer to Patient Instructions for Blood Glucose Monitoring and Insulin /Medications Dosing Guide  in media tab for additional information. Please  also refer to  Patient Self Inventory in the Media  tab for reviewed elements of pertinent patient history.  Elgin CHRISTELLA [place] participated in the discussions, expressed understanding, and voiced agreement with the above plans.  All questions were answered to his satisfaction. he is encouraged to contact clinic should he have any questions or concerns prior to his return visit.   Follow up plan: - Return in about 4 months (around 11/01/2024) for Diabetes F/U with A1c in office, No previsit labs, Bring meter and logs.    Benton Rio, [facility] [address] [place], [place] [address] Phone: [phone] Fax: [phone]   07/03/2024, 2:46 PM  "

## 2024-08-01 ENCOUNTER — Other Ambulatory Visit: Payer: Self-pay | Admitting: Cardiology

## 2024-08-02 MED ORDER — APIXABAN 5 MG PO TABS: 5.0000 mg | ORAL_TABLET | Freq: Two times a day (BID) | ORAL | 5 refills | Status: AC

## 2024-08-10 ENCOUNTER — Ambulatory Visit: Payer: PPO

## 2024-08-11 LAB — CUP PACEART REMOTE DEVICE CHECK
Battery Remaining Longevity: 123 mo
Battery Voltage: 3.01 V
Brady Statistic RA Percent Paced: 0 %
Brady Statistic RV Percent Paced: 96.42 %
Date Time Interrogation Session: 20260205042738
Implantable Lead Connection Status: 753985
Implantable Lead Connection Status: 753985
Implantable Lead Implant Date: 20161115
Implantable Lead Implant Date: 20161115
Implantable Lead Location: 753859
Implantable Lead Location: 753860
Implantable Lead Model: 5076
Implantable Lead Model: 5076
Implantable Pulse Generator Implant Date: 20240815
Lead Channel Impedance Value: 304 Ohm
Lead Channel Impedance Value: 304 Ohm
Lead Channel Impedance Value: 399 Ohm
Lead Channel Impedance Value: 475 Ohm
Lead Channel Pacing Threshold Amplitude: 1.125 V
Lead Channel Pacing Threshold Pulse Width: 0.4 ms
Lead Channel Sensing Intrinsic Amplitude: 0.5 mV
Lead Channel Sensing Intrinsic Amplitude: 0.5 mV
Lead Channel Sensing Intrinsic Amplitude: 7.75 mV
Lead Channel Sensing Intrinsic Amplitude: 7.75 mV
Lead Channel Setting Pacing Amplitude: 2.25 V
Lead Channel Setting Pacing Pulse Width: 0.4 ms
Lead Channel Setting Sensing Sensitivity: 2.8 mV
Zone Setting Status: 755011

## 2024-08-18 ENCOUNTER — Ambulatory Visit: Admitting: Cardiovascular Disease

## 2024-11-01 ENCOUNTER — Ambulatory Visit: Admitting: Nurse Practitioner
